# Patient Record
Sex: Female | Born: 1940 | Race: White | Hispanic: No | Smoking: Former smoker
Health system: Southern US, Community
[De-identification: ages and names within clinical notes are randomized; demographics above are authoritative.]

## PROBLEM LIST (undated history)

## (undated) DIAGNOSIS — R4182 Altered mental status, unspecified: Secondary | ICD-10-CM

## (undated) DIAGNOSIS — I499 Cardiac arrhythmia, unspecified: Secondary | ICD-10-CM

## (undated) DIAGNOSIS — R338 Other retention of urine: Secondary | ICD-10-CM

## (undated) DIAGNOSIS — D649 Anemia, unspecified: Secondary | ICD-10-CM

## (undated) DIAGNOSIS — E1142 Type 2 diabetes mellitus with diabetic polyneuropathy: Secondary | ICD-10-CM

## (undated) DIAGNOSIS — M21622 Bunionette of left foot: Secondary | ICD-10-CM

## (undated) DIAGNOSIS — N329 Bladder disorder, unspecified: Secondary | ICD-10-CM

## (undated) DIAGNOSIS — Z8719 Personal history of other diseases of the digestive system: Secondary | ICD-10-CM

## (undated) DIAGNOSIS — N179 Acute kidney failure, unspecified: Secondary | ICD-10-CM

## (undated) DIAGNOSIS — E785 Hyperlipidemia, unspecified: Secondary | ICD-10-CM

## (undated) DIAGNOSIS — F419 Anxiety disorder, unspecified: Secondary | ICD-10-CM

## (undated) DIAGNOSIS — M549 Dorsalgia, unspecified: Secondary | ICD-10-CM

## (undated) DIAGNOSIS — F329 Major depressive disorder, single episode, unspecified: Secondary | ICD-10-CM

## (undated) DIAGNOSIS — M1711 Unilateral primary osteoarthritis, right knee: Secondary | ICD-10-CM

## (undated) DIAGNOSIS — J189 Pneumonia, unspecified organism: Secondary | ICD-10-CM

## (undated) DIAGNOSIS — M2010 Hallux valgus (acquired), unspecified foot: Secondary | ICD-10-CM

## (undated) DIAGNOSIS — M8700 Idiopathic aseptic necrosis of unspecified bone: Secondary | ICD-10-CM

## (undated) DIAGNOSIS — D62 Acute posthemorrhagic anemia: Secondary | ICD-10-CM

## (undated) DIAGNOSIS — Z9189 Other specified personal risk factors, not elsewhere classified: Secondary | ICD-10-CM

## (undated) DIAGNOSIS — K5900 Constipation, unspecified: Secondary | ICD-10-CM

## (undated) DIAGNOSIS — N390 Urinary tract infection, site not specified: Secondary | ICD-10-CM

## (undated) DIAGNOSIS — G5602 Carpal tunnel syndrome, left upper limb: Secondary | ICD-10-CM

## (undated) DIAGNOSIS — G4730 Sleep apnea, unspecified: Secondary | ICD-10-CM

## (undated) DIAGNOSIS — D72829 Elevated white blood cell count, unspecified: Secondary | ICD-10-CM

## (undated) DIAGNOSIS — Z972 Presence of dental prosthetic device (complete) (partial): Secondary | ICD-10-CM

## (undated) DIAGNOSIS — N9989 Other postprocedural complications and disorders of genitourinary system: Secondary | ICD-10-CM

## (undated) DIAGNOSIS — N189 Chronic kidney disease, unspecified: Secondary | ICD-10-CM

## (undated) DIAGNOSIS — K920 Hematemesis: Secondary | ICD-10-CM

## (undated) DIAGNOSIS — J40 Bronchitis, not specified as acute or chronic: Secondary | ICD-10-CM

## (undated) DIAGNOSIS — J45909 Unspecified asthma, uncomplicated: Secondary | ICD-10-CM

## (undated) DIAGNOSIS — M1990 Unspecified osteoarthritis, unspecified site: Secondary | ICD-10-CM

## (undated) DIAGNOSIS — M797 Fibromyalgia: Secondary | ICD-10-CM

## (undated) DIAGNOSIS — F32A Depression, unspecified: Secondary | ICD-10-CM

## (undated) DIAGNOSIS — G8929 Other chronic pain: Secondary | ICD-10-CM

## (undated) DIAGNOSIS — M1611 Unilateral primary osteoarthritis, right hip: Secondary | ICD-10-CM

## (undated) DIAGNOSIS — Z872 Personal history of diseases of the skin and subcutaneous tissue: Secondary | ICD-10-CM

## (undated) DIAGNOSIS — K219 Gastro-esophageal reflux disease without esophagitis: Secondary | ICD-10-CM

## (undated) DIAGNOSIS — R011 Cardiac murmur, unspecified: Secondary | ICD-10-CM

## (undated) DIAGNOSIS — E43 Unspecified severe protein-calorie malnutrition: Secondary | ICD-10-CM

## (undated) DIAGNOSIS — I10 Essential (primary) hypertension: Secondary | ICD-10-CM

## (undated) HISTORY — DX: Idiopathic aseptic necrosis of unspecified bone: M87.00

## (undated) HISTORY — PX: SPINE SURGERY: SHX786

## (undated) HISTORY — DX: Unilateral primary osteoarthritis, right knee: M17.11

## (undated) HISTORY — DX: Hyperlipidemia, unspecified: E78.5

## (undated) HISTORY — PX: BACK SURGERY: SHX140

## (undated) HISTORY — PX: CATARACT EXTRACTION: SUR2

## (undated) HISTORY — DX: Altered mental status, unspecified: R41.82

## (undated) HISTORY — PX: CARPAL TUNNEL RELEASE: SHX101

## (undated) HISTORY — PX: JOINT REPLACEMENT: SHX530

## (undated) HISTORY — PX: COLON SURGERY: SHX602

## (undated) HISTORY — PX: APPENDECTOMY: SHX54

## (undated) HISTORY — DX: Acute posthemorrhagic anemia: D62

## (undated) HISTORY — DX: Other retention of urine: R33.8

## (undated) HISTORY — DX: Constipation, unspecified: K59.00

## (undated) HISTORY — DX: Unspecified severe protein-calorie malnutrition: E43

## (undated) HISTORY — DX: Acute kidney failure, unspecified: N17.9

## (undated) HISTORY — DX: Hallux valgus (acquired), unspecified foot: M20.10

## (undated) HISTORY — DX: Type 2 diabetes mellitus with diabetic polyneuropathy: E11.42

## (undated) HISTORY — PX: FOOT SURGERY: SHX648

## (undated) HISTORY — PX: COLONOSCOPY: SHX174

## (undated) HISTORY — PX: HERNIA REPAIR: SHX51

## (undated) HISTORY — DX: Other postprocedural complications and disorders of genitourinary system: N99.89

## (undated) HISTORY — DX: Bunionette of left foot: M21.622

## (undated) HISTORY — DX: Unilateral primary osteoarthritis, right hip: M16.11

## (undated) HISTORY — PX: ABDOMINAL HYSTERECTOMY: SHX81

## (undated) HISTORY — DX: Elevated white blood cell count, unspecified: D72.829

## (undated) HISTORY — DX: Personal history of diseases of the skin and subcutaneous tissue: Z87.2

## (undated) HISTORY — DX: Other specified personal risk factors, not elsewhere classified: Z91.89

## (undated) HISTORY — DX: Hematemesis: K92.0

## (undated) MED FILL — Ferumoxytol Inj 510 MG/17ML (30 MG/ML) (Elemental Fe): INTRAVENOUS | Qty: 17 | Status: AC

---

## 1999-06-26 ENCOUNTER — Ambulatory Visit (HOSPITAL_COMMUNITY): Admission: RE | Admit: 1999-06-26 | Discharge: 1999-06-26 | Payer: Self-pay | Admitting: *Deleted

## 1999-06-26 ENCOUNTER — Encounter (INDEPENDENT_AMBULATORY_CARE_PROVIDER_SITE_OTHER): Payer: Self-pay | Admitting: Specialist

## 2000-06-15 ENCOUNTER — Encounter: Admission: RE | Admit: 2000-06-15 | Discharge: 2000-06-15 | Payer: Self-pay | Admitting: Internal Medicine

## 2000-06-15 ENCOUNTER — Encounter: Payer: Self-pay | Admitting: Internal Medicine

## 2001-01-10 ENCOUNTER — Encounter: Payer: Self-pay | Admitting: Neurosurgery

## 2001-01-10 ENCOUNTER — Ambulatory Visit (HOSPITAL_COMMUNITY): Admission: RE | Admit: 2001-01-10 | Discharge: 2001-01-10 | Payer: Self-pay | Admitting: Neurosurgery

## 2001-01-27 ENCOUNTER — Ambulatory Visit (HOSPITAL_COMMUNITY): Admission: RE | Admit: 2001-01-27 | Discharge: 2001-01-27 | Payer: Self-pay | Admitting: Neurosurgery

## 2001-01-27 ENCOUNTER — Encounter: Payer: Self-pay | Admitting: Neurosurgery

## 2001-02-10 ENCOUNTER — Encounter: Payer: Self-pay | Admitting: Neurosurgery

## 2001-02-10 ENCOUNTER — Encounter: Admission: RE | Admit: 2001-02-10 | Discharge: 2001-02-10 | Payer: Self-pay | Admitting: Neurosurgery

## 2001-02-24 ENCOUNTER — Encounter: Admission: RE | Admit: 2001-02-24 | Discharge: 2001-02-24 | Payer: Self-pay | Admitting: Neurosurgery

## 2001-02-24 ENCOUNTER — Encounter: Payer: Self-pay | Admitting: Neurosurgery

## 2002-02-02 ENCOUNTER — Encounter: Payer: Self-pay | Admitting: Specialist

## 2002-02-09 ENCOUNTER — Inpatient Hospital Stay (HOSPITAL_COMMUNITY): Admission: RE | Admit: 2002-02-09 | Discharge: 2002-02-10 | Payer: Self-pay | Admitting: Specialist

## 2002-02-09 ENCOUNTER — Encounter: Payer: Self-pay | Admitting: Specialist

## 2002-07-24 ENCOUNTER — Encounter: Payer: Self-pay | Admitting: Specialist

## 2002-07-25 ENCOUNTER — Inpatient Hospital Stay (HOSPITAL_COMMUNITY): Admission: RE | Admit: 2002-07-25 | Discharge: 2002-07-26 | Payer: Self-pay | Admitting: Specialist

## 2002-12-16 ENCOUNTER — Ambulatory Visit (HOSPITAL_COMMUNITY): Admission: RE | Admit: 2002-12-16 | Discharge: 2002-12-16 | Payer: Self-pay | Admitting: *Deleted

## 2002-12-16 ENCOUNTER — Encounter (INDEPENDENT_AMBULATORY_CARE_PROVIDER_SITE_OTHER): Payer: Self-pay | Admitting: Specialist

## 2003-12-13 ENCOUNTER — Ambulatory Visit (HOSPITAL_COMMUNITY): Admission: RE | Admit: 2003-12-13 | Discharge: 2003-12-13 | Payer: Self-pay | Admitting: *Deleted

## 2003-12-13 ENCOUNTER — Encounter (INDEPENDENT_AMBULATORY_CARE_PROVIDER_SITE_OTHER): Payer: Self-pay | Admitting: Specialist

## 2004-09-18 ENCOUNTER — Ambulatory Visit: Payer: Self-pay | Admitting: Oncology

## 2004-11-13 ENCOUNTER — Ambulatory Visit: Payer: Self-pay | Admitting: Oncology

## 2005-01-08 ENCOUNTER — Ambulatory Visit: Payer: Self-pay | Admitting: Oncology

## 2005-02-26 ENCOUNTER — Ambulatory Visit: Payer: Self-pay | Admitting: Oncology

## 2005-03-06 ENCOUNTER — Inpatient Hospital Stay (HOSPITAL_COMMUNITY): Admission: RE | Admit: 2005-03-06 | Discharge: 2005-03-13 | Payer: Self-pay | Admitting: Specialist

## 2005-03-06 ENCOUNTER — Ambulatory Visit: Payer: Self-pay | Admitting: Physical Medicine & Rehabilitation

## 2005-03-13 ENCOUNTER — Inpatient Hospital Stay (HOSPITAL_COMMUNITY)
Admission: RE | Admit: 2005-03-13 | Discharge: 2005-03-20 | Payer: Self-pay | Admitting: Physical Medicine & Rehabilitation

## 2005-04-18 ENCOUNTER — Ambulatory Visit: Payer: Self-pay | Admitting: Oncology

## 2005-06-06 ENCOUNTER — Ambulatory Visit: Payer: Self-pay | Admitting: Oncology

## 2005-08-01 ENCOUNTER — Ambulatory Visit: Payer: Self-pay | Admitting: Oncology

## 2005-10-03 ENCOUNTER — Ambulatory Visit: Payer: Self-pay | Admitting: Oncology

## 2005-10-08 ENCOUNTER — Ambulatory Visit (HOSPITAL_COMMUNITY): Admission: RE | Admit: 2005-10-08 | Discharge: 2005-10-08 | Payer: Self-pay | Admitting: *Deleted

## 2005-10-08 ENCOUNTER — Encounter (INDEPENDENT_AMBULATORY_CARE_PROVIDER_SITE_OTHER): Payer: Self-pay | Admitting: *Deleted

## 2005-12-05 ENCOUNTER — Ambulatory Visit: Payer: Self-pay | Admitting: Oncology

## 2006-01-03 ENCOUNTER — Encounter: Admission: RE | Admit: 2006-01-03 | Discharge: 2006-01-03 | Payer: Self-pay | Admitting: Specialist

## 2006-01-30 ENCOUNTER — Ambulatory Visit: Payer: Self-pay | Admitting: Oncology

## 2006-02-15 ENCOUNTER — Emergency Department (HOSPITAL_COMMUNITY): Admission: EM | Admit: 2006-02-15 | Discharge: 2006-02-15 | Payer: Self-pay | Admitting: Emergency Medicine

## 2006-03-18 ENCOUNTER — Ambulatory Visit: Payer: Self-pay | Admitting: Oncology

## 2006-04-29 ENCOUNTER — Ambulatory Visit: Payer: Self-pay | Admitting: Oncology

## 2006-05-15 ENCOUNTER — Encounter: Admission: RE | Admit: 2006-05-15 | Discharge: 2006-05-15 | Payer: Self-pay | Admitting: Specialist

## 2006-05-20 ENCOUNTER — Ambulatory Visit: Payer: Self-pay | Admitting: Oncology

## 2006-06-06 ENCOUNTER — Inpatient Hospital Stay (HOSPITAL_COMMUNITY): Admission: RE | Admit: 2006-06-06 | Discharge: 2006-06-07 | Payer: Self-pay | Admitting: Specialist

## 2006-07-24 ENCOUNTER — Ambulatory Visit: Payer: Self-pay | Admitting: Oncology

## 2006-09-25 ENCOUNTER — Ambulatory Visit: Payer: Self-pay | Admitting: Oncology

## 2006-11-13 ENCOUNTER — Ambulatory Visit: Payer: Self-pay | Admitting: Oncology

## 2006-12-11 ENCOUNTER — Ambulatory Visit: Payer: Self-pay | Admitting: Oncology

## 2007-06-16 ENCOUNTER — Encounter: Admission: RE | Admit: 2007-06-16 | Discharge: 2007-06-16 | Payer: Self-pay | Admitting: Specialist

## 2007-07-09 ENCOUNTER — Ambulatory Visit: Payer: Self-pay | Admitting: Oncology

## 2008-01-08 ENCOUNTER — Encounter: Admission: RE | Admit: 2008-01-08 | Discharge: 2008-01-08 | Payer: Self-pay | Admitting: Specialist

## 2008-02-19 ENCOUNTER — Encounter: Admission: RE | Admit: 2008-02-19 | Discharge: 2008-02-19 | Payer: Self-pay | Admitting: Specialist

## 2008-03-10 ENCOUNTER — Encounter: Admission: RE | Admit: 2008-03-10 | Discharge: 2008-03-10 | Payer: Self-pay | Admitting: Specialist

## 2008-04-07 ENCOUNTER — Encounter: Admission: RE | Admit: 2008-04-07 | Discharge: 2008-04-07 | Payer: Self-pay | Admitting: Specialist

## 2010-07-06 ENCOUNTER — Ambulatory Visit (HOSPITAL_COMMUNITY): Admission: RE | Admit: 2010-07-06 | Discharge: 2010-07-06 | Payer: Self-pay | Admitting: Orthopedic Surgery

## 2011-01-18 LAB — BASIC METABOLIC PANEL
BUN: 22 mg/dL (ref 6–23)
CO2: 30 mEq/L (ref 19–32)
Calcium: 9.5 mg/dL (ref 8.4–10.5)
Chloride: 102 mEq/L (ref 96–112)
Creatinine, Ser: 1.18 mg/dL (ref 0.4–1.2)
GFR calc Af Amer: 55 mL/min — ABNORMAL LOW (ref >60)

## 2011-01-18 LAB — GLUCOSE, CAPILLARY
Glucose-Capillary: 104 mg/dL — ABNORMAL HIGH (ref 70–99)
Glucose-Capillary: 128 mg/dL — ABNORMAL HIGH (ref 70–99)

## 2011-01-18 LAB — CBC
MCH: 30.1 pg (ref 26.0–34.0)
MCHC: 33.1 g/dL (ref 30.0–36.0)
MCV: 91 fL (ref 78.0–100.0)
Platelets: 300 10*3/uL (ref 150–400)
RBC: 4.22 MIL/uL (ref 3.87–5.11)
RDW: 14.3 % (ref 11.5–15.5)

## 2011-01-18 LAB — PROTIME-INR: Prothrombin Time: 12.7 seconds (ref 11.6–15.2)

## 2011-03-07 ENCOUNTER — Encounter (HOSPITAL_BASED_OUTPATIENT_CLINIC_OR_DEPARTMENT_OTHER): Payer: Medicare Other | Admitting: Internal Medicine

## 2011-03-07 ENCOUNTER — Other Ambulatory Visit: Payer: Self-pay | Admitting: Internal Medicine

## 2011-03-07 DIAGNOSIS — D72829 Elevated white blood cell count, unspecified: Secondary | ICD-10-CM

## 2011-03-07 LAB — CBC WITH DIFFERENTIAL/PLATELET
Basophils Absolute: 0 10*3/uL (ref 0.0–0.1)
EOS%: 0.5 % (ref 0.0–7.0)
Eosinophils Absolute: 0.1 10*3/uL (ref 0.0–0.5)
HCT: 32.3 % — ABNORMAL LOW (ref 34.8–46.6)
HGB: 11 g/dL — ABNORMAL LOW (ref 11.6–15.9)
MONO#: 1.1 10*3/uL — ABNORMAL HIGH (ref 0.1–0.9)
NEUT#: 12.8 10*3/uL — ABNORMAL HIGH (ref 1.5–6.5)
NEUT%: 75.4 % (ref 38.4–76.8)
RDW: 16.4 % — ABNORMAL HIGH (ref 11.2–14.5)
WBC: 17 10*3/uL — ABNORMAL HIGH (ref 3.9–10.3)
lymph#: 3 10*3/uL (ref 0.9–3.3)

## 2011-03-09 LAB — COMPREHENSIVE METABOLIC PANEL
AST: 12 U/L (ref 0–37)
Albumin: 3.6 g/dL (ref 3.5–5.2)
BUN: 36 mg/dL — ABNORMAL HIGH (ref 6–23)
CO2: 24 mEq/L (ref 19–32)
Calcium: 9.1 mg/dL (ref 8.4–10.5)
Chloride: 101 mEq/L (ref 96–112)
Creatinine, Ser: 1.31 mg/dL — ABNORMAL HIGH (ref 0.40–1.20)
Potassium: 3.7 mEq/L (ref 3.5–5.3)

## 2011-03-09 LAB — LACTATE DEHYDROGENASE: LDH: 202 U/L (ref 94–250)

## 2011-03-23 NOTE — Op Note (Signed)
NAMELILYTH, MONTELLO              ACCOUNT NO.:  [id number]   MEDICAL RECORD NO.:  [id number]          PATIENT TYPE:  INP   LOCATION:  2550                         FACILITY:  [facility]   PHYSICIAN:  [name], M.D.   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 The area of degenerative scoliosis  extending from L2 to sacrum.   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 The area of degenerative scoliosis  extending from L2 to sacrum.  Intraoperative right dural tear L4-5 level.  The small bleb or tear at the L5 level on the right.   PROCEDURE:  Central decompressive laminectomy L2-3 at the level of the  facettes L3-4, L4-5 and L5-S1 with decompression of bilateral L2, bilateral  L3, bilateral L4 at 20 mL, bilateral L5, and bilateral S1 nerve roots. Right  L3-4 TLIF using a 9 mm leopard cage parallel endplates with right-sided  autogenous iliac crest bone graft. Left L4-5 TLIF with a 9 mm parallel  leopard cage with autogenous bone graft and Symphony graft material. Left L5-  S1 TLIF utilizing autogenous right iliac crest bone graft as well as  Symphony bone graft and a 9 mm lordotic cage. She underwent posterior  lateral fusion from L2 to sacrum with instrumentation from L2 to sacrum  using Monarch pedicle screws and rods. Both local and Symphony bone graft  material was used for the posterior lateral fusion component. The dural  repair right side L4-5. This involving the pia mater only, the arachnoid was  intact,  using multiple interrupted 4-0 Nurolon sutures and Tu-seal. The  right L5 level small  tear about 1 mm to 2 mm repaired with a single 4-0  Nurolon suture and Tu-seal.   SURGEON:  [name], M.D.   ASSISTANT:  Epimenio Foot, P.A.   ANESTHESIA:  GOT by Sharolyn Douglas, M.D.   SPECIMENS:  None.   ESTIMATED BLOOD LOSS:  950 mL. The patient received Cell Saver blood of 450  to 500 mL.   COMPLICATIONS:  Dural tear, right L4-5 requiring suture repair. This  involved primarily the pia mater with very little cerebral spinal fluid  loss. Small bleb right L5 level repaired with 4-0 Nurolon suture in Tisseel.  Unable to place a right L2 pedicle screw due to the size of the pedicle.  Difficulty instrumenting it; left side was only side able to be placed at  this level.   HISTORY OF PRESENT ILLNESS:  This patient is a 70 year old female who has  been followed for some time for problems of the collapsing degenerative  scoliosis of the lumbar spine with spondylolisthesis of both L4-5, L5-S1,  retrolisthesis L2-3 and L3-4. Her spondylolisthesis is to left side with  apex at the L3  level. The patient has undergone conservative management with  multiple steroid injections throughout the last decade. Her MRI studies have  demonstrated synovial cyst, moderate multifactorial stenosis of L5-S1, left-  sided foraminal narrowing at L4, L5 and at L5-S1. Protruded disk material  into the left neural foramen at the L5-S1 level. Mild narrowing L2-3,  degenerative disk changes and facette changes at L1-2 with no significant  abnormality at this side. Marked right-sided diskogenic change and  osteophyte formation at the L3-4 level affecting the exiting right L3 nerve  root. The patient was felt to require surgery involving the L3 to sacrum  because of the changes noted in each segment. It is felt that surgery at  only one or two levels would be ineffective and due to scoliosis it was felt  that recurrence would occur without decompression with fusion.   DESCRIPTION OF PROCEDURE:  After  adequate general anesthesia, the patient  had monitoring leads placed on her major muscle groups in upper and lower  extremities for intraoperative EMG monitoring and pedicle screw soft tissue  resistance testing. The patient had Foley catheter placed. Following  intubation, was then rolled to a prone position with chest rolls. All  pressure points well-padded, pillows under the legs. The patient had TED  stockings both lower extremities to prevent DVT. Cell Saver was used during  the case. She received standard preoperative antibiotics of Ancef and had a  repeat Ancef delivered at approximately 4 hours into the case at noon time.  The patient underwent a standard prep with DuraPrep solution from the lower  dorsal spine to the mid sacral level. This was then draped in the usual  manner. Iodine Vi-drape was used. Incision was made ellipsing the old  incision scar at the L3-4 level and extending the incision down to the  spinous process of T12-L1, L2-L3 was missing, L4-L5 and S1-S2. Cobbs were  then used to elevate the paralumbar muscles off the posterior lateral  aspects of the spinous process of L1, L2, L3 was not present. The care taken  not to enter the laminotomy defect at the L3-4 level. Then at the posterior  aspect of L4-L5 paralumbar muscles carefully removed off of these areas and  retracted. This was carried down to the sacrum on both sides. Vicryl  retractor was inserted and continuation of the dissection was carried out  removing the facette capsules at the L5-S1 level, L4-5, L3-4, L2-3 levels.  Intraoperative lateral radiograph for C-arm was used to identify the levels  present and the clamp was placed on the spinous process of L4, spinous  process of L2. Previous central laminectomy was at the L3-4 level as  identified.   Exposure was then carried out to the tips of the transverse process of L2 at the L1-2 facette preserving the L1-2 facette capsule bilaterally. Then the   transverse processes at the L3 level were exposed at the L2-3 facette level,  carried out distally exposing these completely. Then the L4 at the L3-4  joint line carried out laterally and exposed completely. Then the L5 and the  sacral ala bilaterally exposed. Excellent exposure was obtained on the both  sides. The ability to see the lateral aspect of the joints quite nicely and  facettes as well. With this then the central decompression was carried out  of these lateral aspects of the exposed posterior spine were then packed  with sponges. Attention turned to the central decompression of the L5-L4  spinous processes were resected  centrally and the central portions of lamina  carefully thinned. The 3 mm Kerrison then used to further resect the central  portions of the lamina bilaterally, resecting the lamina on the right side  at the L5 level, dural tear was noted. Small opening into a very thin dural  sac and probably the result of steroid medicines that had thinned the sac.  In either case, a single 4-0 Nurolon suture was used to close this area.  Attention then turned to continuation of the decompression at which time the  central laminectomy was further carried out with facetectomy of the left L5-  S1 level, at the L4-5 level on the left, near complete facetectomy on the  right side L4, L5 and L5, S1. The old L3-4 central laminectomy was repeated  with removal of scar over the posterior aspect of the dura at this segment.  Decompression of bilateral L3 and L4 nerve roots as well as foraminotomies  performed here. Right L3-4 facette was resected to allow for latter TLIF.  This decompression was then carried up to 2-3 preserving the L2 spinous  process and performing only a minor lateral recess decompression bilaterally  at L2-3 level. This completed the central laminectomy at which time areas  that showed a small amount of bleeding were packed using thrombin-soaked  Gelfoam. Irrigation  was performed. Pedicle screws were then placed first on  the left side beginning at the sacral level using an awl to make an entry  point just lateral to the superior articular process of L5 inferior extent,  directing it with convergence of about 40 degrees and directing this also  caudally in line with sacral inclination. The screw was placed. Length of 30  mm was used x 7.0. This captured both the posterior and anterior cortex of  the sacrum. Note that tapping was performed. High-speed bur was then used  carefully decorticate the ala and Symphony bone graft with local bone graft  mix was then used over this area of the sacrum on the left side. Screw was  then inserted at sacral level. The next screw placed on the left was at L5  level. The entry point made with an awl and then a pedicle probe used to  probe the pedicle at the L5 level, depth of 40 mm x 7.0 screw was used. Tapping with a 6.25 tap and placing appropriately converging at about 35  degrees from the vertical. This provided excellent fixation of this pedicle  at the L5 level on the left side. Of note that decortication was carried out  of the transverse process and additional symphony local bone graft was  placed. Then attention turned to the L3 level where again a screw was then  placed using an awl for initial entry point and pedicle finder used to probe  the pedicle and then tapping. Note that during the process of using both  pedicle probes and during tapping, ball tip probe was used to probe the  channel to ensure that there was no opening present. At the L4 level this  was carried out without difficulty. An additional 7.0 screw was used on the  left side at the 4 level and this was 40 mm in length placed without  difficulty. This captured the pedicle quite nicely. Attention turned to the  left L3 level where the pedicle screw of 6.25 was used, initial entry point  was made. Awl then used to enter. It was felt that the  initial entry point  was too medial to the pedicle and some broaching of the medial aspect  pedicle occurred so that this was then replaced lateral and this provided  excellent channel into the pedicle, measured for depth 40 mm. 6.25 x 40 mm  screw was placed on the left at the L3 level without difficulty. High-speed  bur then used to decorticate the transverse process. Additional bone graft  applied here left-side L2. Similar to that of 3, very difficult to provide  an entry point using an awl and then using a blunt tip pedicle probe this  area was probed on the left side and measured for depth. 40 mm screw was  used. It was noted quite deep that there was some penetration lateral aspect  of cortex however tapping was performed and screw able to be placed at this  level. The soft tissue resistance measured 21 at the L2 level on the left,  27 at the L3 level, 45 at the L4 level, 37 and the L4 level and 45 at the S1  level. The screws were then left in place. Bone grafting was adequate along  the left side extending into the lateral gutter beyond these pedicle screws.  Attention turned to the right side where sacral screws placed on the right  at the S1 level, again using a 30 mm x 7.0. Using a blunt tipped pedicle  probe to probe the pedicle first and measuring for depth, tapping and then  placing appropriate screw. Screw placed at the L5 level on the right side.  This was then 40 mm x 7.0. Again awl used to make an initial entry point  then the pedicle finder used to probe the pedicle. Channel with ball tip  probe to ensure the cortices were correct and then the screw was then placed  after tapping. Note, the decortication was carried out over the ala and the  transverse process of L5. Bone graft placed out over these areas prior to  placement of pedicle screws. Lag screw placed on the right side at the L4 level and this an awl was used to make an initial entry point and a pedicle  probe  was used to probe the pedicle right-sided L4. This was done without  difficulty. After probing a depth of 40 mm. 40 mm x 6.25 screw was chosen as  this was a quite hard pedicle, sclerotic, however was well within the  pedicle the probe throughout. This tested with the ball tip probe. 40 mm x  6.25 screw was placed at this level on the right side. Attention turned to  the L3 level where a screw was easily placed at this level on right side at  the L3 level using an awl to make an initial entry point and a pedicle  finder to probe the pedicle, measured for depth. 40 mm screw x 6.25 was  used. Attempts made to try and localize the pedicle at the L2 level and this  was unsuccessful mostly because breaking out of the cortex inferiorly and  laterally using the awl and the probe so that this was abandoned on the  right side and a decision made to only instrument the left side at the L2  level and perform posterior lateral and posterior bone graft at this segment  as opposed to at TLIF because of this. Attention then turned to perform a  TLIF first on the left side at the L5-S1 level where the thecal sac and the  L5 nerve root  were identified, retracted. Small epidural veins were  cauterized using bipolar electrocautery. Remaining portions of facette on  left side resected using 3 mm Kerrisons widely. 15 blade scalpel then used  incise the disk. Disk then dilated with 9 mm. The disk material removed  using pituitary rongeurs, curettes and ring curettes as needed until this  was debrided of both the cartilaginous end plates as well as the  degenerative disk present back to bleeding cortical bone surfaces that  represented subchondral bone. Using a sounder and a trial TLIF 9 mm provided  excellent fit. Lordotic 9 mm was chosen. This was packed with bone graft  from Symphony material initially and then attempts at trying to place this  into the disk space the Symphony material seemed to fall out of the  cage so  that an exposure was made of the right iliac crest through a separate  fascial incision over the right posterior superior iliac spine. Incision  made subperiosteal fashion exposing both posterior laterally and exposing  the crest posteriorly at this level. Osteotomes used then to remove bone  laterally. Additionally aspiration was performed of the crest 10 to 15 mL  for further bone graft preparation using Symphony material. The right iliac  crest inner table cancellus bone graft was then harvested using narrow  gouges and curettes. This was used for the cages performed from here on out  as well as the L5-S1 level cage. Cage was packed. Bone graft placed into the  disk space ahead of the cage and the cage impacted into place and subset  about 3 mm. This was placed in left side in order to correct apparent  tendency for this disk space to be further collapsed on the left side. This was inserted without difficulty and impacted into place. Left L4-5 disk was  similarly exposed. The facette on the left side at L4-5 completely resected.  The disk exposed with incision using a 15 blade scalpel and excised in the  posterior lateral aspect the disk on left side, dilated with 8 and 9-mm  dilators. Disk space then curettaged using straight upbiting and down-biting  curettes as well as ring curettes with the right angle curette. Pituitary  rongeur used to remove disk material from the intervertebral disk space  about difficulty.   This completed then the patient's disk had been completely debrided of disk  material as well as cartilaginous endplate down to bleeding bone surfaces.  Bone graft was then placed within this place. A sounder of 9 mm parallel  cage was placed. This seemed to give excellent fit. Additional bone graft  was then placed within the intervertebral disk space and 9 mm cage filled  with autogenous bone graft impacted into place using a laminar spreader  between the  pedicle to allow for further opening of this side. This  completed, the cage was carefully rotated into place using kicker impactors.  This was done without difficulty. Returning then to the 3-4 level on the  right side the patient's residual facette was completely resected. The L3  nerve root identified and retracted superiorly. The disk immediately entered  underneath the L3-4 level on the right side. Nerve root further retracted.  Dilators were then placed dilating the disk space on the right side at the  L3-4 level, elevating the right side superiorly. This completed then the  intervertebral disk space was carefully curettaged using a ring curettes and  straight curettes as best as possible. We were unable to use  very vigorous  curettage here as the disk space was quite narrow. While removing the  facette on the right side at the L3-4 level, the patient had a small spur  that was being removed using a rongeur. Rongeur then moved from side-to-side  and did cause a small dural tear at the L4-5 level about an inch and a half  below at this level and this occurred in the dura mater, did not cross into  the arachnoid and there was no CSF leak. This required closure using  interrupted 4-0 Nurolon sutures, approximating the dura mater. With  Valsalva, there was no evidence of further leak. However Tisseel was applied  at the end of the case to further seal this area. Returning to the L3-4  level, after further dilatation and curettage then bone graft was placed in  the disk space, after placing a 9 mm trial cage which appeared to give an  excellent fit. 9 mm parallel end plate cage was then carefully packed with  autogenous bone graft. Intervertebral disk space packed with additional bone  graft. This was then impacted into place without difficulty providing  excellent distraction of the disk space and restoration of the alignment of the spine at this level. At the L2-3 level decortication was  carried out in  transverse processes. Symphony bone graft was carried up from the L2 to L3  transverse process on that side. On the left side the joint was decorticated  at the L2-3 level. Bone graft placed into the joint. As well as posterior  lateral on the left side. Any remaining autogenous bone graft was placed  into the posterior lateral regions at the upper end of the fusion site  primarily. Rods were then fashioned for placement into the placed fasteners  of the pedicle screws at each level beginning with the right side. This  carefully contoured. Process of contouring the rod did manage to make it to  the floor and had to be placed into a sterilizer and sterilized using the  appropriate techniques for metal implants. Once this was completed and this  was able to be placed in the fasteners on the right side but during the  process of this being sterilized, the left-sided rod was carefully contoured  to the patient's fasteners and screws on the left side. These were then  placed into the screws and the fastener caps were placed at each level. This  was done quite nicely. Once this was completed and fasteners had been  placed, the right side rod was available and this was placed without  difficulty. Each of the caps for the fasteners were then placed without  difficulty on the right side. The cap was then tightened to 80 foot pounds  on the left side at the L2 level.  At the L3 level both sides, caps were  then tightened 80 foot pounds. Compression then obtained between the right  L3 and L4 fasteners and the L4 fastener the right side was then tightened 80  foot pounds. Compression obtained between the left L3 and L4 fasteners and  the left L4 fastener then tightened 80 foot pounds. Compression then  obtained between the L4 and L5 fasteners first on the left side and the L5  tightened to 80 foot pounds and then the right side also compression between  L4-L5 and the right L5 tightened  to 80 foot pounds. On the left side at the  L5-S1 leve,l compression obtained between the fasteners and the  sacral level  was then tightened 80 foot pounds at the fastener rod interval on the right  side similarly, compression obtained between the L5-S1 fasteners and the  right S1 fastener then tightened to the rod at 80 foot pounds. This  completed fastening of the rods. A transverse loading rod was then placed at  the upper L3 level. This was placed as a #9. Transverse loading rods placed  difficulty. Irrigation was performed. Remaining bone graft placed posterior  laterally as noted. Intraoperative C-arm fluoro images obtained in AP and  lateral planes documenting the fixation from L2 to sacrum internally with  TLIF's at L4-5, L3-4 and L5-S1. Attention then turned to dural tear on the  right side. Valsalva procedure performed and demonstrated no leakage. The  patient underwent placement of Tisseel to ensure that the dural repair remained intact and this was placed over the entire side extending from the  L3 level to L5. This completed, then there was very little active bleeding  present at this point. Further irrigation was carried out. Each of the  neural foramen were then carefully probed using a hockey neuro stick. S1  probed freely as did L5 bilaterally, L4 bilaterally, L3 bilaterally, and L2  bilaterally. Soft tissue then allowed to fall back into place. The  paralumbar muscles in midline over the central laminectomy defect extending  from L2-S1 was then carefully approximated loosely with interrupted #1  Vicryl sutures. The lumbodorsal fascia approximated in midline to the  spinous processes of L1-L2 and S1 using interrupted #1 Vicryl sutures in  figure-of-eight simple fashion. Deep subcu layers approximated after  approximating the fascial layers over the right iliac crest with #1 Vicryl  sutures. The deep fascial layers were then closed with interrupted 0 and #1  Vicryl sutures.  The more superficial fascial layers with interrupted 2-0  Vicryl sutures and skin closed with a running subcu stitch of 4-0 Vicryl.  Tincture of Benzoin and Steri-Strips applied, 4x4s, ABD pads affixed to the  skin with paper tape. The patient did have periods during the surgical  procedure where EMG and nerve conduction studies demonstrated some transient  abnormalities. This occurred when the dural tear occurred but was very  transient with return of active, normal activity within seconds. The patient  was then reactivated following return to a supine position, reactivated,  activated and returned to recovery room in satisfactory condition. All  instrument and sponge counts were correct.      JEN/MEDQ  D:  03/06/2005  T:  03/07/2005  Job:  [id number]

## 2011-03-23 NOTE — Op Note (Signed)
NAME:  Gloria Best, Gloria Best                        ACCOUNT NO.:  [id number]   MEDICAL RECORD NO.:  [id number]                   PATIENT TYPE:  AMB   LOCATION:  ENDO                                 FACILITY:  [facility]   PHYSICIAN:  Waverly Ferrari, M.D.                 DATE OF BIRTH:  31-Oct-1941   DATE OF PROCEDURE:  12/13/2003  DATE OF DISCHARGE:                                 OPERATIVE REPORT   PROCEDURE:  Upper endoscopy.   INDICATIONS:  GERD with known Barrett's esophagus.   ANESTHESIA:  Demerol 60 mg, Versed 6 mg.   DESCRIPTION OF PROCEDURE:  With the patient mildly sedated in the left  lateral decubitus position, the Olympus videoscopic endoscope was inserted  in the mouth, passed under direct vision through to the esophagus, which  appeared normal until we reached the distal esophagus, and there were  changes of Barrett's photographed and biopsied.  We entered into the  stomach.  Fundus, body appeared normal.  Antrum showed erythema that was  photographed and biopsied.  The duodenal bulb and second portion of the  duodenum were visualized, and they appeared normal.  From this point the  endoscope was slowly withdrawn, taking circumferential views of the duodenal  mucosa until the endoscope had been pulled back into the stomach, placed in  retroflexion to view the stomach from below, and a loose wrap of the GE  junction around the endoscope was visualized and photographed.  The  endoscope was straightened and withdrawn, taking circumferential views of  the remaining gastric and esophageal mucosa.  The patient's vital signs and  pulse oximetry remained stable.  The patient tolerated the procedure well  without apparent complications.   FINDINGS:  1. Changes of Barrett's esophagus, biopsied.  2. Changes of erythema of the antrum, biopsied.   Await biopsy report.  The patient will call me for results and follow up  with me as an outpatient.  Proceed to colonoscopy as planned.                                              Waverly Ferrari, M.D.    GMO/MEDQ  D:  12/13/2003  T:  12/13/2003  Job:  [id number]

## 2011-03-23 NOTE — H&P (Signed)
[facility]  Patient:    Gloria Best, Gloria Best Visit Number: [id number] MRN: [id number]          Service Type: Attending:  [name], M.D. Dictated by:   Trenda Moots. Mahar, P.A. Adm. Date:  02/09/02                           History and Physical  DATE OF BIRTH:  10/24/1941  CHIEF COMPLAINT:  Back pain and right leg pain.  HISTORY OF PRESENT ILLNESS:  The patient is a 70 year old female with an approximate one-year history of back and leg pain.  She had no trauma starting this, just progressively got worse.  Initially it started off as leg pain and progressed to a combination of right leg and back pain.  She has failed conservative measures.  She has also failed steroid injections.  It was felt that her best course of management at this point would be operative intervention and correction.  The risks and benefits were discussed with the patient by Dr. Basil Dess, and she indicated understanding and wished to proceed.  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  ALLERGIES:  No known drug allergies.  MEDICATIONS: 1. Methotrexate 0.7 injection each week, which has been stopped x2 weeks. 2. Folic acid 2 mg p.o. q.d. 3. Estrace 2 mg p.o. q.d. 4. Arthrotec 75/200, 1 p.o. q.d., which has been stopped x1 week. 5. Bumex 2 mg p.o. q.d. 6. Prilosec 40 mg p.o. q.d. 7. Detrol 2 mg p.o. q.d. 8. Neurontin 300 mg p.o. t.i.d. 9. Flexeril 10 mg p.o. q.d.  PAST MEDICAL HISTORY: 1. 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 2. Hypertension. 3. Urinary incontinence. 4. Frequent UTIs.  5. Gastroesophageal reflux.  6. Hiatal hernia.  7. History of pneumonia, most recently in 1999.  8. Frequent bronchitis.  9. Chronic anemia. 10. Rheumatoid arthritis. 11. Osteoarthritis.  PAST SURGICAL HISTORY:  1. Appendectomy in 1953.  2. C-section in 1969.  3. Foot surgery, hammer toe  correction on the right foot, in 1998.  4. Colon polyp excision in 1999.  5. Eyelid surgery in 2002.  SOCIAL HISTORY:  Denies tobacco use.  Denies alcohol use.  She is married, has four grown children.  Lives with her husband in a two-level home.  She is retired and on disability secondary to her rheumatoid arthritis.  FAMILY HISTORY:  Father alive at age 4, with osteoarthritis.  Mother deceased at age 41, secondary to an aneurysm.  She also had a history of hypertension. The patient has 9 siblings, six of whom have diabetes mellitus.  REVIEW OF SYSTEMS:  The patient denies any fevers, chills, night sweats, or bleeding tendencies.  CNS:  Denies any blurred vision, double vision, headaches, seizure, or paralysis.  CARDIOVASCULAR:  Denies any chest pain, angina, orthopnea, claudication, or palpitations.  PULMONARY:  Denies any shortness of breath, productive cough, or hemoptysis.  GASTROINTESTINAL: Denies any nausea, vomiting, constipation, diarrhea, melena, or bloody stool. GENITOURINARY:  Denies any dysuria, hematuria, or discharge.  MUSCULOSKELETAL: The patient reports decreased sensation in bilateral lower extremities which has been ongoing since she had her colon polyp removed.  The etiology is unclear.  Denies any paralysis, weakness.  PHYSICAL EXAMINATION:  VITAL SIGNS:  Blood pressure 136/80, respirations 16 and unlabored, pulse 72 and regular.  GENERAL:  The patient is a 70 year old white female who is alert and oriented. In no acute  distress.  Well-nourished, well-groomed.  Appears stated age.  She is very pleasant and cooperative to examination.  She walks with a normal gait.  HEENT:  Head normocephalic, atraumatic.  Pupils are equal, round, and reactive to light.  Nares patent bilaterally.  Extraocular movements intact.  Pharynx is clear, without any erythema or exudate.  NECK:  Soft to palpation.  No lymphadenopathy, thyromegaly, or bruits appreciated.  CHEST:   Clear to auscultation bilaterally anterior and posterior.  No rales, rhonchi, stridor, wheezes, or friction rubs appreciated.  HEART:  Regular rate and rhythm.  There is a grade [id number] systolic murmur which has been previously diagnosed by her primary care physician.  ABDOMEN:  Soft to palpation.  Positive bowel sounds throughout.  Nontender, nondistended.  No organomegaly noted.  GENITOURINARY:  Not pertinent, not performed.  EXTREMITIES:  Decreased sensation bilaterally to light touch.  Right is worse than the left throughout the entire lower extremity.  Not in a specific dermatomal pattern.  Motor function is intact.  She is neurovascularly intact. Posterior dorsalis pedis and posterior tibialis pulses bilaterally.  SKIN:  Intact.  Without any lesions or rashes.  LABORATORY DATA:  MRI as per HPI.  IMPRESSION:  1. 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  2. Hypertension.  3. Urinary incontinence.  4. Frequent UTIs.  5. Gastroesophageal reflux.  6. Hiatal hernia.  7. History of pneumonia, most recently in 1999.  8. Frequent bronchitis.  9. Chronic anemia. 10. Rheumatoid arthritis. 11. Osteoarthritis.  PLAN:  1. Admit to [facility] on February 09, 2002, for a right L5     hemilaminectomy with right L5 nerve root decompression.  This will be done     by Dr. Basil Dess.  2. The patients primary care physician is Dr. Lysbeth Galas, who has forwarded a    letter of medical clearance indicating that the patient is medically    stable and may proceed with the proposed surgery. Dictated by:   Trenda Moots. Mahar, P.A. Attending:  [name], M.D. DD:  01/30/02 TD:  01/30/02 Job: [id number]

## 2011-03-23 NOTE — Op Note (Signed)
Gloria Best, Gloria Best              ACCOUNT NO.:  [id number]   MEDICAL RECORD NO.:  [id number]          PATIENT TYPE:  INP   LOCATION:  5030                         FACILITY:  [facility]   PHYSICIAN:  [name], M.D.   DATE OF BIRTH:  1941-03-10   DATE OF PROCEDURE:  06/06/2006  DATE OF DISCHARGE:                                 OPERATIVE REPORT   SURGEON:  [name], M.D.   ASSISTANT:  None.   PREOPERATIVE DIAGNOSIS:  Right L2-3 herniated nucleus pulposus, with  persistent right proximal thigh pain and weakness; the herniation is below  previous fusion, L2 to sacrum; this level without transforaminal lumbar  interbody fusion.   POSTOPERATIVE DIAGNOSIS:  Right L2-3 herniated nucleus pulposus, with  persistent right proximal thigh pain and weakness; the herniation is below  previous fusion, L2 to sacrum; this level without transforaminal lumbar  interbody fusion.   PROCEDURE:  Right-sided L2-3 microdiskectomy.   ANESTHESIA:  General via orotracheal intubation, Dr. Crissie Sickles. [place], M.D.   SPECIMENS:  None.   ESTIMATED BLOOD LOSS:  15 cc.   COMPLICATIONS:  None.   DISPOSITION:  The patient returned to the PACU in good condition.   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  She did well initially following her surgery with  return of good function, standing and walking, but has recently over the  last half year developed increasing pain in her back with radiation to the  right leg.  Underwent conservative management.  Eventually, it has gotten to  the point where she is having severe persistent pain requiring narcotic  medicines, difficulty with any length of standing and walking due to leg  weakness and pain on the right side.  Myelogram was done and this  demonstrated a disk  herniation on the right side at the L2-3 level,  affecting the right side of the thecal sac.  Her findings were consistent  with L2 and L3 radiculopathy.   INTRAOPERATIVE FINDINGS:  Large disk protrusion, right sided, fragmented  herniated disk material, in addition spondylosis affecting the facet joint  on this side, compressing the thecal sac.   DESCRIPTION OF PROCEDURE:  After adequate general anesthesia, the patient  was placed on the [place] frame in knee-chest position.  Standard prep with  DuraPrep solution, draped in the usual manner.  A spinal needle was placed.  Intraoperative lateral radiograph demonstrated the needle was at the L3 and  4 level.  Incision made above these levels, ellipsing the old incisional  scar in the midline down to the lumbodorsal fascia.  Electrocautery used to  control bleeders.  Weitlaner retractor there.  The lumbodorsal fascia  incised over the residual portion of the spinous process of L2, carried  distally an additional inch.  The paralumbar muscle on the right side then  carefully elevated off the right side of the lamina of L2 down to the lamina  and the joint on the right side  at L2-3.  A clamp then placed over the  spinous process of L2.  An additional clamp placed over the soft tissue  below this level.  The clamp at the spinous process of L2 on lateral joint  and second radiograph here.  These areas marked for continued  identification.  The spinous process was then debrided, removing  approximately 3/4 of the spinous process up to the level of the patient's  inferior aspect of the lamina of L2.  This was carefully freed up using a  curet.  Then, the lateral border of the previous laminectomy region for  laminectomy and fusion was identified.  The pedicle screw at the L3 level  was identified.  Under loupe magnification, then, the exposure was obtained  initially.  Then, the operating room microscope was brought into the field.  A 3-mm  Kerrison was used to excise the inferior 50% of the lamina on the  right side at L2, opening the neural canal, the spinal canal, identifying  the thecal sac and the lateral recess and the L2 nerve root on the right  side.  Pedicle screw of L3 identified.  And, carefully, a 2.0 and 4.0  straight curet were used to debride the medial border of the pedicle,  identifying this, and then the superior articular process of L3, extending  upward.  The superior articular process of L3 appeared to be impinging  significantly upon the thecal sac at the 2-3 level, indenting the sac  posteriorly.  The upper edge of the superior articular process of L3 was  identified and a curet used to remove a small portion of this until a 2-mm  Kerrison could be introduced beneath the superior margin of the superior  articular process of L3.  This was then used to carefully remove the medial  portion of the superior articular process of L3 and decompress the lateral  recess at the L2-3 level.  An osteotome was used to cut the medial facet  margin, and then this was removed using a pituitary as well as nerve hook,  excising this from the lateral recess and decompressing the lateral recess  here.  Immediately following the removal of this fragment of superior  articular process of L3, the disk material that was found to be present  represented herniated disk material at the L2-3 level.  Pituitaries were  used to excise this disk material.  The thecal sac was able to be retracted  medially, and pituitaries were then able to be inserted into the L2-3 disk,  excising further fragmented degenerative disk.  The disk material also had  tented/extended over the posterior aspect of the body of L3 downward in the  lateral recess on the right side.  This was further excised using the ball-  tipped probes provided, as well as a hockey stick neural probe.  When the disk material had been debrided from the spinal canal as well as  over the  disk space, then irrigation was performed.  Careful inspection of the L2 and  L3 nerve roots demonstrated them to be patent and to be exiting their neural  foramina without further impingement.  Further irrigation was performed.  Thrombin-soaked Gelfoam used to control bleeders.  Bone wax applied to the  cancellous bone, and all Gelfoam was removed.  Soft tissues were allowed to  fall back into place, as there was no active bleeding present.  Lumbodorsal  fascia was then reapproximated in the midline with interrupted 0 Vicryl  sutures,  with the deep subcu layers approximated with interrupted 0 Vicryl  sutures and the more superficial layers with interrupted 2-0 Vicryl sutures.  Skin closed with a running subcu stitch of 4-0 Vicryl.  Dermabond was then  applied.  Once this was applied, a Coverlet dressing was applied.  The  patient was then returned to her bed, reactivated, extubated and returned to  the recovery room in satisfactory condition.  All instrument and sponge  counts were correct.      [name], M.D.  Electronically Signed     JEN/MEDQ  D:  06/06/2006  T:  06/07/2006  Job:  [id number]

## 2011-03-23 NOTE — H&P (Signed)
Gloria Best, Gloria Best              ACCOUNT NO.:  [id number]   MEDICAL RECORD NO.:  [id number]          PATIENT TYPE:  IPS   LOCATION:  [id number]                         FACILITY:  [facility]   PHYSICIAN:  Charlett Blake, M.D.DATE OF BIRTH:  [birth year]   DATE OF ADMISSION:  03/13/2005  DATE OF DISCHARGE:                                HISTORY & PHYSICAL   CHIEF COMPLAINT:  Back pain, left knee pain.   HISTORY:  A 70 year old female.  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  Underwent central decompressive laminectomy  at L2-3, bilateral laminectomy L3, L4, L5 with interbody fusion L4-5, repair  of intraoperative dural tear L4-5, and small tear L5 per Dr. Louanne Skye on Mar 06, 2005.  Postoperatively with some nausea.  Reglan added May 6.  Has had  problems with urinary retention requiring I&O catheterization.  Subcutaneous  Lovenox added May 8 for DVT prophylaxis.  Procrit for chronic anemia resumed  as of May 8.  Has had some leukocytosis.  Left lower extremity pain which is  positional in nature.  She also has left wrist pain, old wrist fracture  wearing splint chronically.  Left knee x-ray showed mild DJD.   REVIEW OF SYSTEMS:  In addition to above problem, positive reflux,  constipation, urinary retention, back pain, wound area lumbago, cervicalgia,  weakness.   PAST MEDICAL HISTORY:  1.  Hypertension.  2.  Back surgeries April 2003, September 2003.  3.  Extracted cataracts bilateral.  4.  Cesarean section.  5.  Appendectomy.  6.  TAH/BSO.  7.  Anemia of chronic disease.  8.  Colonic polyps.  9.  Colon resection 1997.  10. Barrett's esophagus.  11. RA.  12. Neuropathy.  13. Bladder tack in 2005.  14. Right foot hammer toe correction.   SOCIAL HISTORY:  Married.  Lives with husband.  One-level home.  One step to  entry.  Independent prior to admission.  Daughter to assist after  discharge.  Negative ETOH.  Remote tobacco.   FUNCTIONAL HISTORY:  Independent prior to admission.  Functional status  currently min assist bed mobility, min assist transfers, min assist  ambulating 25 feet with walker.   HOME MEDICATIONS:  Nexium, Flexeril, Nasacort, Ultram, Vicodin, Bumex,  Micardis, Catapres, Lyrica, Arthrotec, folate, methotrexate.   ALLERGIES:  ADHESIVE TAPE.   Last hemoglobin 9.7 on May 6.  Last BUN 12 and creatinine 0.9 on May 5.  Last potassium 4.4 on May 5.  Chest x-ray May 2:  Left perihilar  atelectasis.   PHYSICAL EXAMINATION:  GENERAL:  Elderly female in no acute distress.  HEENT:  Eyes noninjected, anicteric.  External ENT normal.  NECK:  Supple without adenopathy.  LUNGS:  Clear.  Good respiratory effort.  HEART:  Regular rate and rhythm.  No rubs, murmurs, or extra sounds.  EXTREMITIES:  Without edema.  Left patella has tenderness at the superior  pole, increases with range of motion.  She has no evidence of knee effusion.  No evidence of calf tenderness.  Negative Homan's.  Negative thigh swelling  or pain.  ABDOMEN:  Positive bowel sounds, soft, nontender to palpation.  BACK:  Incision is healing well.  No evidence of drainage.  No erythema in  that area.  No fluid collections palpable.  NEUROLOGIC:  Mood and affect are normal.  Cognition appears intact.  Sensation intact to light touch bilateral lower extremities.   Motor strength 5/5 bilateral deltoid, biceps, triceps grip, 5 on the right  lower extremity, 4- in the left lower extremity.   IMPRESSION:  1.  Lumbar spondylolisthesis of radicular symptoms which have improved after      decompression.  She continues to have low back pain status post fusion      postoperative day seven using back precautions and brace when up.  2.  Pain management.  Flexeril will be increased to t.i.d.  Lyrica is      continued for neuropathy.  Ultram is q.i.d.  She has p.r.n. oxycodone      and has been started  on Celebrex for tendinitis per Dr. Louanne Skye just      today.  Will increase it to b.i.d.  3.  Left knee patellar tendinitis.  Will add ice.  Will add sports cream.      Increase the Celebrex, as noted.  4.  Rheumatoid arthritis.  Continue methotrexate.  5.  Urinary retention.  Will continue to monitor.  Check urinalysis, C&S.  6.  Left wrist splint for remote fracture.   ESTIMATED LENGTH OF STAY:  Seven days.  Good rehabilitation candidate.      AEK/MEDQ  D:  03/13/2005  T:  03/13/2005  Job:  [id number]   cc:   [name], M.D.  [place]  [place] [address]  Fax: [phone]   Waverly Ferrari, M.D.  [address] [place]  [place] [address]  Fax: [phone]

## 2011-03-23 NOTE — Consult Note (Signed)
Gloria Best, Gloria Best              ACCOUNT NO.:  [id number]   MEDICAL RECORD NO.:  [id number]          PATIENT TYPE:  IPS   LOCATION:  [id number]                         FACILITY:  [facility]   PHYSICIAN:  Domingo Pulse, M.D.  DATE OF BIRTH:  [birth year]   DATE OF CONSULTATION:  03/14/2005  DATE OF DISCHARGE:                                   CONSULTATION   REASON FOR CONSULTATION:  Postoperative urinary retention.   HISTORY:  This 70 year old female underwent recent spine surgery by Dr.  Basil Dess.  The patient initially voided well when her Foley catheter was  removed but then went into urinary retention.  She had a Foley catheter in  place.  The patient was transferred to the rehab unit on Mar 13, 2005.  The  patient states that after her catheter was removed she has actually been  able to urinate, and she really feels that she is no longer having a problem  with her voiding.   The patient does have a past history of previous episodes of urinary  retention.  She is status post bladder suspension surgery by Dr. Nila Nephew in  [place] and had prolonged retention following that surgery last year.  She  was taught self-catheterization and eventually had normalization of her  voiding function.  The patient also had a similar episode of retention  following colon surgery and was told that she had bladder shock.  Eventually, this straightened out as well.  The patient now feels that a  similar process occurred this time, and she feels she is voiding quite  nicely.   The patient is status post L4-5 and L5-S1 surgery because of collapsing  lumbar spondylolisthesis.  The patient suffered from low back pain for many  years and has had several previous back procedures in 2003.   PAST MEDICAL HISTORY:  1.  Hypertension.  2.  Anemia of chronic disease.  3.  The patient has previously undergone colon resection.  4.  She also is known to have some peripheral neuropathy.  5.  She has had Barrett's  esophagus.  6.  She has had problems with cataracts.  She has had bilateral cataract      surgery.   OTHER PROCEDURES:  1.  Appendectomy.  2.  Tonsillectomy.  3.  Cesarean section.  4.  Right hammertoe surgery.   SOCIAL HISTORY:  Unremarkable.  The patient has not used tobacco in many  years.  She does not use alcohol.  She is married and lives in [place].   FAMILY HISTORY AND REVIEW OF SYSTEMS:  Noncontributory.   PHYSICAL EXAMINATION:  GENERAL:  The patient is a well-developed, well-  nourished female in no acute distress.  She is resting comfortably.  PELVIC:  Since the patient is emptying her bladder to completion, there  really is no need for a pelvic examination to be performed.   I would be happy to see the patient should any problems arise.  Should the  patient have recurrence of her retention, clearly she should be restarted on  her self-catheterization, and we would recommend that she undergo urodynamic  evaluation in our office post discharge.  If she continues to void well,  however, that clearly will not be necessary, as these episodes of retention  appear to be induced by surgical procedures.      RJE/MEDQ  D:  03/14/2005  T:  03/14/2005  Job:  [id number]   cc:   Domingo Pulse, M.D.  [address] [place]  [place] [address]  Fax: [phone]   Charlett Blake, M.D.  [address]  [place]  [place] [address]  Fax: [phone]   [name], M.D.  [address]  [place]  [place] [address]  Fax: [phone]

## 2011-03-23 NOTE — Op Note (Signed)
[name], [name]              ACCOUNT NO.:  [id number]   MEDICAL RECORD NO.:  [id number]          PATIENT TYPE:  AMB   LOCATION:  ENDO                         FACILITY:  [facility]   PHYSICIAN:  Waverly Ferrari, M.D.    DATE OF BIRTH:  16-Jul-1941   DATE OF PROCEDURE:  10/08/2005  DATE OF DISCHARGE:                                 OPERATIVE REPORT   PROCEDURE:  Upper endoscopy with biopsy.   INDICATIONS:  GERD.   ANESTHESIA:  Demerol 50, Versed 5 mg.   PROCEDURE:  With the patient mildly sedated in the left lateral decubitus  position, the Olympus videoscopic endoscope was inserted in the mouth,  passed under direct vision through the esophagus, and the squamocolumnar  junction could not be well seen.  It was photographed as best we could and  biopsied as best we could.  From this point, we entered into the stomach.  Fundus, body, antrum, duodenal bulb, second portion of duodenum were  visualized.  From this point, the endoscope was slowly withdrawn taking  circumferential views of duodenal mucosa until the endoscope had been pulled  back in the stomach, placed in retroflexion to view the stomach from below.  The endoscope was straightened and withdrawn, taking circumferential views  of remaining gastric and esophageal mucosa.  The patient's vital signs and  pulse oximeter remained stable.  The patient tolerated the procedure well  without apparent complications.   FINDINGS:  Await biopsy report.  The patient will call me for results and  follow up with me as an outpatient.           ______________________________  Waverly Ferrari, M.D.     GMO/MEDQ  D:  10/08/2005  T:  10/08/2005  Job:  [id number]

## 2011-03-23 NOTE — H&P (Signed)
NAME:  Gloria Best, Blew NO.:  [id number]   MEDICAL RECORD NO.:  [id number]                   PATIENT TYPE:   LOCATION:                                       FACILITY:   PHYSICIAN:  [place] BIRTH:  08/23/1941   DATE OF ADMISSION:  07/24/2002  DATE OF DISCHARGE:                                HISTORY & PHYSICAL   CHIEF COMPLAINT:  Pain in my back and right leg.   HISTORY OF PRESENT ILLNESS:  This 70 year old female seen by Dr. Louanne Skye for  continuing and progressive problems concerning her low back.  She has had a  previous lumbar laminectomy in the early part of this year, but has  continued with right lower extremity pain.  This is primarily in the knee  anteriorly.  She also has occasional paresthesias in the same area.  Motion  was seen at L5-S1, however, when MRI was performed we primarily saw  discopathy seen at the L3-4 level.  Right side extension was seen from a  broad based disk protrusion entering the foramen.  This was effecting the  right L4 and L3 nerve roots.  This was in keeping with her pain pattern as  mentioned above.  Due to this finding, as well as the fact that the patient  had progressive problems which is recalcitrant to conservative care, it was  felt that surgical intervention be indicated for the L3-4 level on the  right.  She is admitted for that surgery.   PAST MEDICAL HISTORY:  This lady has been in relatively good health.  Dr.  Dannielle Huh is her family doctor.   MEDICATIONS:  1. Methotrexate 0.7 injections each week.  2. Folic acid 2 mg p.o. q.d.  3. Esterase 2 mg p.o. q.d.  4. Arthrotek 75/200 one p.o. q.d.  5. Bumex 2 mg p.o. q.d.  6. Prilosec 40 mg p.o. q.d.  7. Detrol 2 mg p.o. q.d.   ALLERGIES:  No known drug allergies.   PAST SURGICAL HISTORY:  1. L5 hemilaminectomy with right L4-5 nerve root compression in 4/03.  2. Appendectomy in 1953.  3. Cesarean section in  1969.  4. Hammer toe correction of the right foot in 1998.  5. Colon polyp excision by endoscopy in 1999.  6. Eyelid surgery in 2002.   PAST MEDICAL HISTORY:  1. Hypertension.  2. Urinary incontinence.  3. Frequent urinary tract infections.  4. Gastroesophageal reflux disease.  5. Hiatal hernia.  6. Frequent bronchitis.  7. Chronic anemia.  8. Rheumatoid arthritis.  9. Osteoarthritis.   SOCIAL HISTORY:  The patient is a housewife, neither smokes nor drinks.   FAMILY HISTORY:  Positive for hypertension and diabetes in her father and  brother.  Mother died of heart disease.   REVIEW OF SYMPTOMS:  CNS:  No seizures, paralysis, no trouble with vision,  but radiculitis as mentioned above with the L3-4 nerve root on the right.  RESPIRATORY:  No productive cough, no hemoptysis, no shortness of breath.  CARDIOVASCULAR:  No chest pain, no angina, no orthopnea.  GASTROINTESTINAL:  No nausea, vomiting, melena, or bloody stool.  GENITOURINARY:  No discharge,  hematuria, dysuria.  MUSCULOSKELETAL:  Primarily in history of present  illness.   PHYSICAL EXAMINATION:  GENERAL:  Alert, cooperative, and friendly 45-year-  old white female.  VITAL SIGNS:  Blood pressure 135/85, pulse 70, respiratory rate 20,  temperature 98.  HEENT:  Normocephalic.  Pupils equal, round, reactive to light and  accommodation.  Extraocular movements were intact.  Oropharynx is clear.  CHEST:  Clear to auscultation.  No rhonchi, no rales.  HEART:  Regular rate and rhythm, no murmurs are heard.  ABDOMEN:  Soft, nontender, liver and spleen not felt.  GENITALIA:  Not done, not pertinent to present illness.  RECTAL:  Not done, not pertinent to present illness.  PELVIC:  Not done, not pertinent to present illness.  BREASTS:  Not done, not pertinent to present illness.  EXTREMITIES:  As in present illness above.   ADMISSION DIAGNOSES:  1. Herniated nucleus pulposis at L3 and L4 on the right, and central.  2.  Gastroesophageal reflux disease.  3. Rheumatoid arthritis.  4. Hypertension.  5. Urinary incontinence.  6. Frequent urinary tract infections.  7. Hiatal hernia.  8. Frequent bronchitis.  9. Chronic anemia.  10.      Osteoarthritis.   PLAN:  The patient will undergo right microdiskectomy of L3 and L4.        Dooley L. Clabe Seal, P.A.             [phone]   DLU/MEDQ  D:  07/09/2002  T:  07/09/2002  Job:  [id number]

## 2011-03-23 NOTE — Op Note (Signed)
   NAME:  LAUREA, TITLOW                        ACCOUNT NO.:  [id number]   MEDICAL RECORD NO.:  [id number]                   PATIENT TYPE:  AMB   LOCATION:  ENDO                                 FACILITY:  [facility]   PHYSICIAN:  Waverly Ferrari, M.D.                 DATE OF BIRTH:  08/29/1941   DATE OF PROCEDURE:  DATE OF DISCHARGE:                                 OPERATIVE REPORT   PROCEDURE:  Upper endoscopy with biopsy.   INDICATIONS FOR PROCEDURE:  Barrett's.   ANESTHESIA:  Demerol 50, Versed 4 mg.   DESCRIPTION OF PROCEDURE:  With the patient mildly sedated in the left  lateral decubitus position, the Olympus videoscopic endoscope was inserted  in the mouth and passed under direct vision through the esophagus which  appeared normal until we reached the distal esophagus and changes of  Barrett's were seen, photographed and biopsied. We entered into the stomach,  fundus, body, antrum, duodenal bulb and second portion of the duodenum  appeared normal. From this point, the endoscope was slowly withdrawn taking  circumferential views of the duodenal mucosa until the endoscope was then  pulled back in the stomach, placed in retroflexion to view the stomach from  below. The endoscope was then straightened and withdrawn taking  circumferential views of the remaining gastric and esophageal mucosa. The  patient's vital signs and pulse oximeter remained stable. The patient  tolerated the procedure well without apparent complications.   FINDINGS:  Barrett's esophagus biopsied. Await biopsy report. The patient  will call me for results and followup with me in as an outpatient.                                               Waverly Ferrari, M.D.    GMO/MEDQ  D:  12/16/2002  T:  12/16/2002  Job:  [id number]

## 2011-03-23 NOTE — Discharge Summary (Signed)
Gloria Best, Gloria Best              ACCOUNT NO.:  [id number]   MEDICAL RECORD NO.:  [id number]          PATIENT TYPE:  IPS   LOCATION:  [id number]                         FACILITY:  [facility]   PHYSICIAN:  Charlett Blake, M.D.DATE OF BIRTH:  25-Jan-1941   DATE OF ADMISSION:  03/13/2005  DATE OF DISCHARGE:  03/20/2005                                 DISCHARGE SUMMARY   DISCHARGE DIAGNOSES:  1.  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  2.  Hypertension.  3.  Rheumatoid arthritis.  4.  Constipation.  5.  Left wrist fracture.   HISTORY OF PRESENT ILLNESS:  Gloria Best is a 70 year old female with history  of hypertension, chronic low back pain, secondary to collapsing lumbar  spondylolisthesis, L4-L5, L5-S1, and retrolisthesis, L2-L3, L3-L4.  She  underwent central decompressive laminectomy, L2-L3, bilateral laminectomy,  L3-S1 with TLIF L4-5, and repair of intraoperative dural tear L4-5, and  small tear of left L5 on Mar 06, 2005 by Dr. Louanne Skye.  Postoperatively, the  patient with some nausea requiring addition of Reglan.  She has also had  problems with urinary retention requiring in and out cath.  Subcutaneous  Lovenox was added on Mar 12, 2005 for DVT prophylaxis.  The patient with  history of chronic anemia, and Procrit was resumed to help assist with her  anemia.  The patient has had complaints of left lower extremity pain with  tendency to keep the lower extremity flexed.  She is currently using partial  walker.  To be mobilized with left wrist splint in place for an old left  wrist fracture.  She is currently at minimum assist for bed mobility,  minimum assist for transfers, minimum assist ambulating 25 feet.  Therefore,  CIR was consulted for progression.   HOSPITAL COURSE:  Gloria Best was admitted to rehabilitation on Mar 13, 2005 for inpatient therapies to consist of PT and OT daily.  Post  admission, the patient was maintained on subcutaneous Lovenox for DVT  prophylaxis.  Celebrex was added to help with her rheumatoid arthritis  symptoms.  The patient has had issues with constipation, and was set up on a  bowel regimen.  Voiding was monitored with PVR checks, and the patient was  noted to be voiding without difficulty.  As the patient's pain control  improved, she was able to improve her mobility.  Pain control was reasonable  with the use of p.r.n. Vicodin, scheduled Ultram, scheduled Flexeril, as  well as Celebrex.  Her back incision was healing well without any signs of  symptoms of infection.   LABORATORY DATA:  CBC revealed hemoglobin of 9.6, hematocrit 29.5, white  count 12.6, platelets 783.  Check of electrolytes revealed sodium of 132,  potassium 5.2, chloride 97, CO2 of 29, BUN 20, creatinine 0.8, glucose 103.  UA UC was done post admission showing patient with greater than 100,000  colonies of E. coli in her urine.  She was started on Keflex, and is to  continue antibiotics for 5 total days of antibiotic therapy.  The patient made good progress during her stay in rehabilitation.  She  progressed to being a supervision level for transfer, supervision level for  ambulating 160 feet with platform rolling walker.  She required set up  assist for ADL's.  Further follow up therapies will be home health PT by  [facility].  The patient's family to provide assistance as needed  post discharge.  On Mar 20, 2005, the patient is discharged to home.   DISCHARGE MEDICATIONS:  1.  Restasis 0.05% one GGT OU b.i.d.  2.  Catapres 0.1 mg q.h.s.  3.  Flexeril 10 mg t.i.d.  4.  Estrace 1 mg daily.  5.  Folic acid 1 mg daily.  6.  Micardis hydrochlorothiazide 80-12.5 daily.  7.  Ultram 50 mg q.i.d.  8.  __________ 5 mg daily.  9.  Arthrotec b.i.d.  10. Keflex 250 mg q.i.d.  11. Oxycodone IR 5-10 mg q.4-6h. p.r.n. pain.  12. Iron supplements once a day.   ACTIVITY:   1.  Ambulate with use of walker.  2.  Follow routine back precautions.  3.  May shower without brace.   DIET:  Regular.   WOUND CARE:  Wash the area with soap and water.  Keep the area clean and  dry.   SPECIAL INSTRUCTIONS:  1.  Do not use Bumex or Vicodin.  2.  Dulcolax tabs 2 p.o. q.h.s. for constipation.   FOLLOW UP:  1.  The patient is to follow up with Dr. Louanne Skye in 1-2 weeks.  2.  Follow up with Dr. Lysbeth Galas for routine check.  3.  Follow up with Dr. Letta Pate as needed.      PP/MEDQ  D:  03/21/2005  T:  03/21/2005  Job:  [id number]   cc:   [name], M.D.  [address]  [place]  [place] [address]  Fax: [phone]

## 2011-03-23 NOTE — Op Note (Signed)
[facility]  Patient:    ARLINGTON, RILE Visit Number: [id number] MRN: [id number]          Service Type: SUR Location: 1S X003 01 Attending Physician:  Madilyn Hook Dictated by:   [name], M.D. Proc. Date: 02/09/02 Admit Date:  02/09/2002                             Operative Report  PREOPERATIVE DIAGNOSIS:  Right lateral recess stenosis L5-S1 associated with a grade 1 spondylolithesis with foraminal entrapment right L5 and S1 nerve roots.  POSTOPERATIVE DIAGNOSES: 1. Right lateral recess stenosis L5-S1. 2. 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 3. Right S1 nerve root compression.  PROCEDURE:  Right L5-S1 semi-hemilaminectomy with right L5 and S2 foraminotomy decompression of the right lateral recess at the L5-S1 level.  SURGEON:  [name], M.D.  ASSISTANT:  Duncan Dull. Troncale, P.A.C.  ANESTHESIA:  GOT, Dr. Deirdre Peer.  ESTIMATED BLOOD LOSS:  10 cc.  DRAINS:  None.  BRIEF CLINICAL HISTORY:  The patient is a 70 year old female, with a long history of back pain with radiation to the left leg.  This has been worsening over the last six months with progressive neurogenic claudication.  The patient has undergone epidural steroid injections with only temporizing relief of pain.  She is brought to the operating room to undergo a right-sided foraminal decompression at the L5-S1 level.  Pain is in the lateral thigh, sometimes in the medial aspect of the thigh, and she is to undergo both decompression of the S1 nerve and L5 nerve roots.  INTRAOPERATIVE FINDINGS:  The patient was found to have impression on the S1 nerve root such that after performing lateral recess decompression, indentation of the S1 nerve root remained, an area of small blood vessels noted to be decreased in the area of compression.  However, there did not appear to be any significant ongoing compression following decompression.  The L5 nerve  root was also decompressed with the excision of the attachments to the ligamentum flavum superiorly to the undersurface of the L5 lamina as well as the reflected portion over the right L5-S1 facet.  DESCRIPTION OF PROCEDURE:  After adequate general anesthesia, the patient in knee-chest position, Andrews frame, standard preoperative antibiotics, standard prep DuraPrep solution and draped in the usual manner, iodine-impregnated Vi-Drape.  The patients incision initially approximately 3-3.5 cm in length through the skin and subcutaneous layers in the midline at the expected L5-S1 level.  Electrocautery used to divide the subcu layers down to the lumbodorsal fascia, and this was incised along the right side and then on the left side and clamp placed on the expected S1 spinous process. Intraoperative radiograph demonstrated this to be the S1 spinous process.  It was marked for the remaining portion of the case to be identified.  Two Cobbs were used to elevate the paralumbar muscles off the right side of the posterior aspect in the laminar space of the L5-S1 off the lamina of L5 and also posterior aspect of the lamina of S1.  McCullough retractor was inserted.  Excellent exposure obtained over the posterior aspect of the disk space, Leksell rongeur used to remove a small portion of the inferior aspect of the lamina of L5, 3 mm Kerrison then used to further resect a portion of the inferior aspect of the lamina on the right side at L5 up to the attachment of the ligamentum flavum.  This was then carefully retracted with a nerve hook and resected off of the inferior aspect of the lamina at L5, along the lateral aspects of the spinal canal, the medial aspect of the L5-S1 facet, performing a partial facetectomy of the right L5-S1 facet.  And this was done out to the level of the pedicle inferiorly.  Foraminotomy performed over the S1 nerve root.  S1 nerve root was noted to be indented.  There was  also evidence of synovial cyst with material extending off of the facet joint along the right side S1 nerve root, and this was resected off of the medial facet on the right side at L5-S1.  Irrigation was performed.  Hockey-stick nerve probe could be passed out at the S1 neural foramen without difficulty.  There was still some evidence of impingement by the superior articular portion and superior articular process of S1 within the neural foramen on the right side, and this was resected by varying the scope position and allow for oblique examination of the neural foramen.  Then resected portions of the resecting portions of the reflected ligamentum flavum and the superior aspect of the S1 superior articular process.  Following this, then the L5 nerve root was completely decompressed.  The hockey-stick nerve probe could be easily passed out the neural foramen without difficulty.  Examination of the L5-S1 disk demonstrated no protrusion or rupture present.  Exam of both nerve roots demonstrated no further compression.  Irrigation was performed.  Bleeders controlled using bipolar electrocautery.  Small portion of Gelfoam placed over the laminotomy defect.  The lumbodorsal fascia reapproximated in the midline with interrupted #1 Vicryl sutures, deep subcu layers approximated with interrupted 0 and #1 Vicryl sutures, more superficial layers with interrupted 2-0 Vicryl sutures, and the skin closed with a running subcu stitch of 4-0 Vicryl.  Tincture of Benzoin and Steri-Strips applied, then 4 x 4s affixed to the skin with Hypafix tape.  The patient then reactivated and returned to the recovery room in a satisfactory condition.  Note that the operating room microscope was used during this procedure.  It was brought into the field immediately following resection of a small portion of the inferior aspect of the lamina using Leksell rongeur and used throughout the decompression of the lateral recess  on the right side and foraminotomies.   All instrument and sponge counts were correct. Dictated by:   [name], M.D.  Attending Physician:  Madilyn Hook DD:  02/09/02 TD:  02/09/02 Job: [id number]

## 2011-03-23 NOTE — Op Note (Signed)
NAME:  Gloria Best, Gloria Best                        ACCOUNT NO.:  [id number]   MEDICAL RECORD NO.:  [id number]                   PATIENT TYPE:  AMB   LOCATION:  DAY                                  FACILITY:  [facility]   PHYSICIAN:  [name], M.D.                DATE OF BIRTH:  1941-01-05   DATE OF PROCEDURE:  07/24/2002  DATE OF DISCHARGE:                                 OPERATIVE REPORT   PREOPERATIVE DIAGNOSES:  Central and right sided L3-4 herniated nucleus  pulposus with right L3 and L4 nerve root compression.   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   PROCEDURE:  L3-4 central and right sided partial laminectomy subtotal with  right L3-4 lateral recess decompression and right L3-4 microdiskectomy.   SURGEON:  [name], M.D.   ASSISTANT:  Pedro Earls, P.A.-C.   ANESTHESIA:  GOT, Concepcion Living, M.D.   ESTIMATED BLOOD LOSS:  3 cc   DRAINS:  None.   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 She has, however,  developed progressive pain and claudication in the upper thigh in an area  different than her previous pain pattern. This pain entered the anterior  aspect of her leg and into her knee consistent with an upper lumbar  radiculopathy, positive signs of reverse straight leg raise. Post myelogram  CT scan demonstrating retrolisthesis at the L3-4 level. MRI demonstrating  apparent disk protrusion at L3-4 with compression of both the L3 and L4  nerve roots with some degree of lateral recess stenosis evident due to  retrolisthesis at L3 and L4.   INTRAOPERATIVE FINDINGS:  The patient was found to have  retrolisthesis at L3  and L4, right sided neuroforaminal narrowing affecting the right L3 nerve  root and compression of the right L4 nerve root secondary to lateral recess  stenosis due to hypertrophic ligamentum flavum changes as well as  hypertrophic joint changes.   DESCRIPTION OF PROCEDURE:  After adequate general anesthesia, the patient in  the knee chest position, Andrews frame, standard preoperative antibiotics,  standard prep with Duraprep solution, draped in the usual manner, Vidrape  was used. Needles placed at the expected L3 and L4 levels. Intraoperative  lateral radiograph demonstrated the upper needle at the upper end of L3,  lower needle at the L4 level. The incision made between these two needle  holes after infiltration with Marcaine 0.5% with 1:200,000 epinephrine  nearly 15 cc. The incision carried sharply through the skin and subcu layers  down to the lumbodorsal fascia in the midline. The incision then carried  over the spinous process at expected L3 and L4 level. Clamps placed over the  spinous processes here. Lateral intraoperative radiograph demonstrated the  upper clamp at the L2 level, lower clamp at the L3 level. Cobs were then  used to elevate the paralumbar muscles off the posterior aspect of the  elements at the L3 level both sides.   This was done exposing the L3-4 interlaminar space posteriorly as well as  debriding the lamina posteriorly of muscle attachments right and left side.  McCullough retractor was inserted. Because of the patient's scoliosis, the  spinous process overhanging the right side expected laminectomy site, it was  felt that removal of the spinous process was necessary as well as to examine  the neuroforamen adequately on the right side. This was done using a  Kerrison excising the spinous process of L3 centrally and completely. Then  thinning the posterior aspects of the lamina centrally at the L3 level. A 3  mm Kerrison was then able  to be introduced at the midline underneath the  lamina of L3 and then central laminectomy was carried out over 3/4 of the  length of the L3 lamina from inferior to superior. This was carried  primarily central towards the right side, left facet and left major portions  of lamina were left intact.   Bone removal was performed resecting approximately 30% of the medial aspect  of the right facet at the L3-4 level. Hypertrophic ligamentum flavum changes  were immediately noted and appeared to be impressing upon the thecal sac at  the right side L3-4 level. A nerve hook as well as cushings were used to  hold the ligamentum flavum away from the thecal sac and then this was  resected along the medial aspect of the facet using 3 and 2 mm Kerrisons.  Foraminotomy performed over the L4 nerve root using a 3 mm Kerrison and then  foraminotomy performed over the L3 nerve root using 3 mm and 2 mm Kerrisons.  A hockey stick nerve probe was used to protect the L3 nerve root while  performing foraminotomy, it was also used to examine the foraminotomy at the  L4 level so that the hockey stick could be easily passed out beneath the L4  and posterior to the L4 nerve root within the L4 neuroforamen. Likewise this  was able to be done at the L3 neuroforamen following foraminotomy. A  D'Errico retractor was then used to retract the thecal sac as well as the L4  nerve root to the midline. The small epidural veins were cauterized using  bipolar electrocautery, posterior aspect of the disk at the L3-4 level was  then examined and felt to be herniated centrally and towards the right side.  A 15 blade scalpel was used to incise the disk longitudinally,  micropituitaries were then used to excise the disk centrally in the right  side at the L3-4 level decompressing the right L3 neuroforamen as well as  the central portions of the canal and the right side at L3-4. Once the disk was removed posteriorly at this segments,  no attempts were made to remove  additional disk material from within the disk spaces. The disk space could  not be entered due to extreme collapse of the disk space. Irrigation was  performed. Again a hockey stick nerve probe could be passed out the L4 and  L3 neuroforamen without difficulty. Irrigation again performed. Careful  inspection demonstrated no active bleeding evident. Bone wax was applied to  bleeding cancellous bone surfaces that were hemostased. Excess bone wax  removed. Thrombin soaked Gelfoam then placed over the laminotomy defect  posteriorly. Deep subcu layers approximated loosely using interrupted #0  Vicryl sutures. The lumbodorsal fascia approximated in the midline with  interrupted #0 Vicryl sutures. The more superficial layers with interrupted  #0 and 2-0 Vicryl sutures and the skin closed with a running subcu stitch  with 4-0 Vicryl. Tinctured Benzoin and Steri-Strips applied and 4 x 4s  affixed to the skin with Hypofix tape. The patient was then returned to the  supine position, reactivated and extubated and returned to the recovery room  in satisfactory condition. All instrument and sponge counts were correct.                                               [name], M.D.    JEN/MEDQ  D:  07/24/2002  T:  07/25/2002  Job:  [id number]

## 2011-03-23 NOTE — Discharge Summary (Signed)
Gloria Best, Best              ACCOUNT NO.:  [id number]   MEDICAL RECORD NO.:  [id number]          PATIENT TYPE:  INP   LOCATION:  [id number]                         FACILITY:  [facility]   PHYSICIAN:  [name], M.D.   DATE OF BIRTH:  [birth year]   DATE OF ADMISSION:  03/06/2005  DATE OF DISCHARGE:  03/13/2005                                 DISCHARGE SUMMARY   ADMISSION DIAGNOSES:  1.  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 Area of degenerative      scoliosis extending from lumbar vertebrae-2 to sacrum.  2.  Hypertension.  3.  Rheumatoid arthritis.  4.  Iron-deficiency anemia.  5.  Gastroesophageal reflux disease.  6.  Hiatal hernia.   DISCHARGE DIAGNOSES:  1.  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 Area of degenerative scoliosis extending from      lumbar vertebrae-2 to sacrum.  2.  Intraoperative right dural tear lumbar vertebrae-4-5 level. Small bleb      or tear at the lumbar vertebrae-5 level on the right.  3.  Remaining diagnosis same as above including posthemorrhagic anemia.   PROCEDURE:  On Mar 06, 2005, the patient underwent central decompressive  laminectomy L2-3 at the level of facets L3-4, L4-5 and L5-S1 with  decompression of bilateral L2, bilateral L3, bilateral L4, bilateral L5, and  bilateral S1 nerve roots. Right L3-4 T-lift with right-sided autogenous  iliac crest bone graft. Left L4-5 T-lift with autogenous bone graft and  symphony graft material. Left L5, S1 T-lift with autogenous right iliac  crest bone graft as well as symphony bone graft. Posterolateral fusion from  L2 to sacrum with instrumentation from L2 to sacrum using Monarch pedicle  screws and rods. Dural repair  right side L4-5 as well as repair at the right  L5 level. This performed by Dr. Louanne Skye, assisted by Phillips Hay P.A.C.  under general anesthesia.   CONSULTATIONS:  [place].   BRIEF HISTORY:  Gloria Best is a 70 year old female with collapsing  degenerative scoliosis of the lumbar spine with spondylolisthesis of both L4-  5, L5-S1, retrolisthesis of L2-3 and L3-4. Conservative management including  multiple steroid injections have lasted for a number of years. Her recent  MRI's have demonstrated synovial cyst, moderate multifactorial stenosis of  L5-S1, left sided foraminal narrowing at L4-5 and at L5-S1. Also protruded  disk material into the left neural foramen at the L5-S1. Mild narrowing L2-  3, degenerative disk changes and facette changes at L1-2. Marked right sided  diskogenic change and osteophyte formation at L3-4 level affecting the  exiting right L3 nerve root.   The patient was admitted to undergo the procedure as stated above due to her  lack of results with treatment conservatively at this time.   BRIEF HOSPITAL COURSE:  The patient tolerated  the procedure under general  anesthesia. Postoperatively, she was placed at complete bedrest for 48 hours  secondary to her dural tear. Neurovascular motor function of the lower  extremities remained intact throughout the hospital stay. The patient did  have edema of her face, postoperatively; however, none of the conjunctiva.  This was felt to be from OR positioning. The patient was monitored closely  for postoperative ileus. She was treated with clear liquids and IV Reglan  until flatus and bowel movement were obtained. Initially, PCA medications  were utilized; however, the patient became oversedated and this was changed  to IV push medications and oral analgesics which were sufficient towards the  latter part of the hospital stay. The patient was fitted for a TLSO which  was donned and  doffed in the edge of the bed position. Occupational physical  therapy assisted the patient with activity during the hospital stay. She was  quite slow to progress with her activity and a rehabilitation consult was  obtained. She was felt to be a suitable candidate for inpatient  rehabilitation. She was placed on the bed waiting list and continued to  receive physical therapy daily until a bed was noted to be available.   The patient was placed on Lovenox for DVT prophylaxis. The pharmacy protocol  for [facility] was followed. She was also resumed on her Epogen for  her anemia. The patient was noted to have some urinary retention on Mar 13, 2005. Foley catheter was placed. She did have a significant history for  urinary retention following previous colon cancer. On Mar 13, 2005, a bed was  available rehabilitation. As she did have a Foley catheter and was stable  from all other standpoints, it was felt she would be ready for transfer to  utilize the bed and continue with her inpatient rehabilitation. Dressing  changes were done throughout the hospital stay. Small amounts of serous  drainage were noted on her dressing but no active drainage.   Urine cultures from Mar 09, 2005 showed no growth. Chemistry studies on  admission were within normal limits. BUN noted to be 40 on admission without  albumin 3.4. The patient had episodes of hyponatremia which was corrected  with adjustments in IV fluids. Potassium was lowest at 3.3 and treated with  oral supplementation. Coagulation studies on admission were within normal  limits. Preoperative CBC with WBC 16.1, hemoglobin 12.3, hematocrit 37.0.  Hemoglobin fluctuated dropping to the lowest value of 8.2. The patient did  receive 2 units of packed red blood cells during the hospital stay. At the  time of discharge, hemoglobin and hematocrit were 9.7 and 29.3 respectively. The patient was noted to have an elevated white count at 26.9 at the  time of  transfer to rehabilitation.   PLAN:  The patient was transferred to the inpatient rehabilitation area of  the hospital. There she will continue to receive physical therapy for  ambulation and gait training. She will wear her TLSO at all times. It may be  donned and doffed at the edge of the bed. Dressing changes will be done with  a clean dressing placed daily. The patient will continue working with  occupational therapy for ADL's. She did complain of significant knee pain  and Dr. Louanne Skye did restart her Celebrex. A list of her medications were sent  with her to the rehabilitation center for continuation of these medications.  The patient should have daily dressing changes. She will continue on a  regular diet. Her urinary retention will be addressed while at the  rehabilitation center with voiding trials done once her Foley catheter is  discontinued. The patient will follow up with Dr. Louanne Skye following her stay  at the rehabilitation.   CONDITION ON DISCHARGE:  Stable.     Epimenio Foot, P.A.      [name], M.D.  Electronically Signed   SMV/MEDQ  D:  08/02/2005  T:  08/02/2005  Job:  [id number]

## 2011-03-23 NOTE — Op Note (Signed)
NAME:  Gloria Best, Gloria Best                        ACCOUNT NO.:  [id number]   MEDICAL RECORD NO.:  [id number]                   PATIENT TYPE:  AMB   LOCATION:  ENDO                                 FACILITY:  [facility]   PHYSICIAN:  Waverly Ferrari, M.D.                 DATE OF BIRTH:  1940/11/29   DATE OF PROCEDURE:  DATE OF DISCHARGE:                                 OPERATIVE REPORT   PROCEDURE:  Colonoscopy.   INDICATIONS:  Colon polyps.   ANESTHESIA:  Demerol 40 mg, Versed 3 mg.   DESCRIPTION OF PROCEDURE:  With the patient mildly sedated and in the left  lateral decubitus position, the Olympus videoscopic colonoscope was inserted  in the rectum and passed under direct vision to the neocecum, identified by  what appeared to be anastomosis site with the small bowel which was  photographed.  From this point the colonoscope was slowly withdrawn, taking  circumferential views of the colonic mucosa, stopping in the rectum which  appeared normal on direct and showed normal on the retroflexed view.  The  endoscope was straightened and withdrawn.  The patient's vital signs and  pulse oximetry remained stable.  The patient tolerated the procedure well  without apparent complications.   FINDINGS:  Unremarkable colonoscopic examination to the neocecum.   PLAN:  Repeat examination possibly in five years.                                               Waverly Ferrari, M.D.    GMO/MEDQ  D:  12/13/2003  T:  12/13/2003  Job:  [id number]

## 2011-11-08 DIAGNOSIS — M069 Rheumatoid arthritis, unspecified: Secondary | ICD-10-CM | POA: Diagnosis not present

## 2011-11-08 DIAGNOSIS — Z79899 Other long term (current) drug therapy: Secondary | ICD-10-CM | POA: Diagnosis not present

## 2011-11-12 DIAGNOSIS — D518 Other vitamin B12 deficiency anemias: Secondary | ICD-10-CM | POA: Diagnosis not present

## 2011-11-12 DIAGNOSIS — I498 Other specified cardiac arrhythmias: Secondary | ICD-10-CM | POA: Diagnosis not present

## 2011-11-12 DIAGNOSIS — J441 Chronic obstructive pulmonary disease with (acute) exacerbation: Secondary | ICD-10-CM | POA: Diagnosis not present

## 2011-11-15 DIAGNOSIS — J449 Chronic obstructive pulmonary disease, unspecified: Secondary | ICD-10-CM | POA: Diagnosis not present

## 2011-11-19 DIAGNOSIS — L97409 Non-pressure chronic ulcer of unspecified heel and midfoot with unspecified severity: Secondary | ICD-10-CM | POA: Diagnosis not present

## 2011-11-22 DIAGNOSIS — M545 Low back pain: Secondary | ICD-10-CM | POA: Diagnosis not present

## 2011-11-22 DIAGNOSIS — M169 Osteoarthritis of hip, unspecified: Secondary | ICD-10-CM | POA: Diagnosis not present

## 2011-11-22 DIAGNOSIS — M4000 Postural kyphosis, site unspecified: Secondary | ICD-10-CM | POA: Diagnosis not present

## 2011-11-22 DIAGNOSIS — M76899 Other specified enthesopathies of unspecified lower limb, excluding foot: Secondary | ICD-10-CM | POA: Diagnosis not present

## 2011-11-23 DIAGNOSIS — G4733 Obstructive sleep apnea (adult) (pediatric): Secondary | ICD-10-CM | POA: Diagnosis not present

## 2011-11-24 DIAGNOSIS — K219 Gastro-esophageal reflux disease without esophagitis: Secondary | ICD-10-CM | POA: Diagnosis present

## 2011-11-24 DIAGNOSIS — D649 Anemia, unspecified: Secondary | ICD-10-CM | POA: Diagnosis not present

## 2011-11-24 DIAGNOSIS — E785 Hyperlipidemia, unspecified: Secondary | ICD-10-CM | POA: Diagnosis present

## 2011-11-24 DIAGNOSIS — R110 Nausea: Secondary | ICD-10-CM | POA: Diagnosis not present

## 2011-11-24 DIAGNOSIS — E876 Hypokalemia: Secondary | ICD-10-CM | POA: Diagnosis not present

## 2011-11-24 DIAGNOSIS — E119 Type 2 diabetes mellitus without complications: Secondary | ICD-10-CM | POA: Diagnosis not present

## 2011-11-24 DIAGNOSIS — Z9181 History of falling: Secondary | ICD-10-CM | POA: Diagnosis not present

## 2011-11-24 DIAGNOSIS — D638 Anemia in other chronic diseases classified elsewhere: Secondary | ICD-10-CM | POA: Diagnosis present

## 2011-11-24 DIAGNOSIS — R7989 Other specified abnormal findings of blood chemistry: Secondary | ICD-10-CM | POA: Diagnosis not present

## 2011-11-24 DIAGNOSIS — I059 Rheumatic mitral valve disease, unspecified: Secondary | ICD-10-CM | POA: Diagnosis not present

## 2011-11-24 DIAGNOSIS — E1149 Type 2 diabetes mellitus with other diabetic neurological complication: Secondary | ICD-10-CM | POA: Diagnosis not present

## 2011-11-24 DIAGNOSIS — N179 Acute kidney failure, unspecified: Secondary | ICD-10-CM | POA: Diagnosis not present

## 2011-11-24 DIAGNOSIS — J189 Pneumonia, unspecified organism: Secondary | ICD-10-CM | POA: Diagnosis not present

## 2011-11-24 DIAGNOSIS — J449 Chronic obstructive pulmonary disease, unspecified: Secondary | ICD-10-CM | POA: Diagnosis present

## 2011-11-24 DIAGNOSIS — B952 Enterococcus as the cause of diseases classified elsewhere: Secondary | ICD-10-CM | POA: Diagnosis present

## 2011-11-24 DIAGNOSIS — E1142 Type 2 diabetes mellitus with diabetic polyneuropathy: Secondary | ICD-10-CM | POA: Diagnosis not present

## 2011-11-24 DIAGNOSIS — A419 Sepsis, unspecified organism: Secondary | ICD-10-CM | POA: Diagnosis not present

## 2011-11-24 DIAGNOSIS — I4891 Unspecified atrial fibrillation: Secondary | ICD-10-CM | POA: Diagnosis not present

## 2011-11-24 DIAGNOSIS — J9600 Acute respiratory failure, unspecified whether with hypoxia or hypercapnia: Secondary | ICD-10-CM | POA: Diagnosis not present

## 2011-11-24 DIAGNOSIS — N183 Chronic kidney disease, stage 3 unspecified: Secondary | ICD-10-CM | POA: Diagnosis not present

## 2011-11-24 DIAGNOSIS — B370 Candidal stomatitis: Secondary | ICD-10-CM | POA: Diagnosis not present

## 2011-11-24 DIAGNOSIS — E860 Dehydration: Secondary | ICD-10-CM | POA: Diagnosis not present

## 2011-11-24 DIAGNOSIS — R55 Syncope and collapse: Secondary | ICD-10-CM | POA: Diagnosis not present

## 2011-11-24 DIAGNOSIS — N390 Urinary tract infection, site not specified: Secondary | ICD-10-CM | POA: Diagnosis present

## 2011-11-24 DIAGNOSIS — I129 Hypertensive chronic kidney disease with stage 1 through stage 4 chronic kidney disease, or unspecified chronic kidney disease: Secondary | ICD-10-CM | POA: Diagnosis present

## 2011-11-24 DIAGNOSIS — R1013 Epigastric pain: Secondary | ICD-10-CM | POA: Diagnosis not present

## 2011-11-24 DIAGNOSIS — G4733 Obstructive sleep apnea (adult) (pediatric): Secondary | ICD-10-CM | POA: Diagnosis not present

## 2011-11-24 DIAGNOSIS — E871 Hypo-osmolality and hyponatremia: Secondary | ICD-10-CM | POA: Diagnosis not present

## 2011-12-03 DIAGNOSIS — G4733 Obstructive sleep apnea (adult) (pediatric): Secondary | ICD-10-CM | POA: Diagnosis not present

## 2011-12-10 DIAGNOSIS — E871 Hypo-osmolality and hyponatremia: Secondary | ICD-10-CM | POA: Diagnosis not present

## 2011-12-10 DIAGNOSIS — R112 Nausea with vomiting, unspecified: Secondary | ICD-10-CM | POA: Diagnosis not present

## 2011-12-10 DIAGNOSIS — I4891 Unspecified atrial fibrillation: Secondary | ICD-10-CM | POA: Diagnosis not present

## 2011-12-10 DIAGNOSIS — J449 Chronic obstructive pulmonary disease, unspecified: Secondary | ICD-10-CM | POA: Diagnosis not present

## 2011-12-10 DIAGNOSIS — K5641 Fecal impaction: Secondary | ICD-10-CM | POA: Diagnosis not present

## 2011-12-10 DIAGNOSIS — D649 Anemia, unspecified: Secondary | ICD-10-CM | POA: Diagnosis not present

## 2011-12-10 DIAGNOSIS — D72829 Elevated white blood cell count, unspecified: Secondary | ICD-10-CM | POA: Diagnosis not present

## 2011-12-10 DIAGNOSIS — R109 Unspecified abdominal pain: Secondary | ICD-10-CM | POA: Diagnosis not present

## 2011-12-10 DIAGNOSIS — E119 Type 2 diabetes mellitus without complications: Secondary | ICD-10-CM | POA: Diagnosis not present

## 2011-12-10 DIAGNOSIS — K56609 Unspecified intestinal obstruction, unspecified as to partial versus complete obstruction: Secondary | ICD-10-CM | POA: Diagnosis not present

## 2011-12-10 DIAGNOSIS — K560 Paralytic ileus: Secondary | ICD-10-CM | POA: Diagnosis not present

## 2011-12-10 DIAGNOSIS — K5900 Constipation, unspecified: Secondary | ICD-10-CM | POA: Diagnosis not present

## 2011-12-11 DIAGNOSIS — R112 Nausea with vomiting, unspecified: Secondary | ICD-10-CM | POA: Diagnosis not present

## 2011-12-11 DIAGNOSIS — I4891 Unspecified atrial fibrillation: Secondary | ICD-10-CM | POA: Diagnosis present

## 2011-12-11 DIAGNOSIS — R109 Unspecified abdominal pain: Secondary | ICD-10-CM | POA: Diagnosis not present

## 2011-12-11 DIAGNOSIS — N189 Chronic kidney disease, unspecified: Secondary | ICD-10-CM | POA: Diagnosis present

## 2011-12-11 DIAGNOSIS — E119 Type 2 diabetes mellitus without complications: Secondary | ICD-10-CM | POA: Diagnosis present

## 2011-12-11 DIAGNOSIS — K5900 Constipation, unspecified: Secondary | ICD-10-CM | POA: Diagnosis not present

## 2011-12-11 DIAGNOSIS — K560 Paralytic ileus: Secondary | ICD-10-CM | POA: Diagnosis present

## 2011-12-11 DIAGNOSIS — D649 Anemia, unspecified: Secondary | ICD-10-CM | POA: Diagnosis present

## 2011-12-11 DIAGNOSIS — K5641 Fecal impaction: Secondary | ICD-10-CM | POA: Diagnosis present

## 2011-12-11 DIAGNOSIS — E876 Hypokalemia: Secondary | ICD-10-CM | POA: Diagnosis not present

## 2011-12-11 DIAGNOSIS — Z7901 Long term (current) use of anticoagulants: Secondary | ICD-10-CM | POA: Diagnosis not present

## 2011-12-11 DIAGNOSIS — I129 Hypertensive chronic kidney disease with stage 1 through stage 4 chronic kidney disease, or unspecified chronic kidney disease: Secondary | ICD-10-CM | POA: Diagnosis present

## 2011-12-11 DIAGNOSIS — J449 Chronic obstructive pulmonary disease, unspecified: Secondary | ICD-10-CM | POA: Diagnosis not present

## 2011-12-11 DIAGNOSIS — E871 Hypo-osmolality and hyponatremia: Secondary | ICD-10-CM | POA: Diagnosis present

## 2011-12-11 DIAGNOSIS — K56609 Unspecified intestinal obstruction, unspecified as to partial versus complete obstruction: Secondary | ICD-10-CM | POA: Diagnosis not present

## 2011-12-11 DIAGNOSIS — J189 Pneumonia, unspecified organism: Secondary | ICD-10-CM | POA: Diagnosis not present

## 2011-12-11 DIAGNOSIS — J4489 Other specified chronic obstructive pulmonary disease: Secondary | ICD-10-CM | POA: Diagnosis present

## 2011-12-11 DIAGNOSIS — D72829 Elevated white blood cell count, unspecified: Secondary | ICD-10-CM | POA: Diagnosis not present

## 2011-12-21 DIAGNOSIS — J189 Pneumonia, unspecified organism: Secondary | ICD-10-CM | POA: Diagnosis not present

## 2011-12-21 DIAGNOSIS — R5381 Other malaise: Secondary | ICD-10-CM | POA: Diagnosis not present

## 2011-12-26 DIAGNOSIS — M069 Rheumatoid arthritis, unspecified: Secondary | ICD-10-CM | POA: Diagnosis not present

## 2011-12-26 DIAGNOSIS — E538 Deficiency of other specified B group vitamins: Secondary | ICD-10-CM | POA: Diagnosis not present

## 2011-12-26 DIAGNOSIS — D638 Anemia in other chronic diseases classified elsewhere: Secondary | ICD-10-CM | POA: Diagnosis not present

## 2011-12-26 DIAGNOSIS — E876 Hypokalemia: Secondary | ICD-10-CM | POA: Diagnosis not present

## 2011-12-26 DIAGNOSIS — N189 Chronic kidney disease, unspecified: Secondary | ICD-10-CM | POA: Diagnosis not present

## 2011-12-26 DIAGNOSIS — D473 Essential (hemorrhagic) thrombocythemia: Secondary | ICD-10-CM | POA: Diagnosis not present

## 2011-12-26 DIAGNOSIS — E119 Type 2 diabetes mellitus without complications: Secondary | ICD-10-CM | POA: Diagnosis not present

## 2012-01-02 DIAGNOSIS — M1610 Unilateral primary osteoarthritis, unspecified hip: Secondary | ICD-10-CM | POA: Diagnosis not present

## 2012-01-08 DIAGNOSIS — J189 Pneumonia, unspecified organism: Secondary | ICD-10-CM | POA: Diagnosis not present

## 2012-01-09 DIAGNOSIS — R609 Edema, unspecified: Secondary | ICD-10-CM | POA: Diagnosis not present

## 2012-01-09 DIAGNOSIS — M79609 Pain in unspecified limb: Secondary | ICD-10-CM | POA: Diagnosis not present

## 2012-01-14 DIAGNOSIS — L03119 Cellulitis of unspecified part of limb: Secondary | ICD-10-CM | POA: Diagnosis not present

## 2012-01-16 DIAGNOSIS — M76899 Other specified enthesopathies of unspecified lower limb, excluding foot: Secondary | ICD-10-CM | POA: Diagnosis not present

## 2012-01-16 DIAGNOSIS — R39198 Other difficulties with micturition: Secondary | ICD-10-CM | POA: Diagnosis not present

## 2012-01-16 DIAGNOSIS — R339 Retention of urine, unspecified: Secondary | ICD-10-CM | POA: Diagnosis not present

## 2012-01-16 DIAGNOSIS — M545 Low back pain: Secondary | ICD-10-CM | POA: Diagnosis not present

## 2012-01-16 DIAGNOSIS — M169 Osteoarthritis of hip, unspecified: Secondary | ICD-10-CM | POA: Diagnosis not present

## 2012-01-25 DIAGNOSIS — L500 Allergic urticaria: Secondary | ICD-10-CM | POA: Diagnosis not present

## 2012-01-25 DIAGNOSIS — D518 Other vitamin B12 deficiency anemias: Secondary | ICD-10-CM | POA: Diagnosis not present

## 2012-01-30 DIAGNOSIS — R339 Retention of urine, unspecified: Secondary | ICD-10-CM | POA: Diagnosis not present

## 2012-01-30 DIAGNOSIS — N1330 Unspecified hydronephrosis: Secondary | ICD-10-CM | POA: Diagnosis not present

## 2012-02-14 DIAGNOSIS — M069 Rheumatoid arthritis, unspecified: Secondary | ICD-10-CM | POA: Diagnosis not present

## 2012-03-13 DIAGNOSIS — IMO0001 Reserved for inherently not codable concepts without codable children: Secondary | ICD-10-CM | POA: Diagnosis not present

## 2012-03-13 DIAGNOSIS — M069 Rheumatoid arthritis, unspecified: Secondary | ICD-10-CM | POA: Diagnosis not present

## 2012-03-13 DIAGNOSIS — M159 Polyosteoarthritis, unspecified: Secondary | ICD-10-CM | POA: Diagnosis not present

## 2012-03-14 DIAGNOSIS — M069 Rheumatoid arthritis, unspecified: Secondary | ICD-10-CM | POA: Diagnosis not present

## 2012-04-02 DIAGNOSIS — M545 Low back pain: Secondary | ICD-10-CM | POA: Diagnosis not present

## 2012-04-02 DIAGNOSIS — M76899 Other specified enthesopathies of unspecified lower limb, excluding foot: Secondary | ICD-10-CM | POA: Diagnosis not present

## 2012-04-02 DIAGNOSIS — M169 Osteoarthritis of hip, unspecified: Secondary | ICD-10-CM | POA: Diagnosis not present

## 2012-04-03 ENCOUNTER — Other Ambulatory Visit: Payer: Self-pay | Admitting: Specialist

## 2012-04-03 DIAGNOSIS — R29898 Other symptoms and signs involving the musculoskeletal system: Secondary | ICD-10-CM

## 2012-04-07 DIAGNOSIS — M25559 Pain in unspecified hip: Secondary | ICD-10-CM | POA: Diagnosis not present

## 2012-04-07 DIAGNOSIS — M169 Osteoarthritis of hip, unspecified: Secondary | ICD-10-CM | POA: Diagnosis not present

## 2012-04-10 ENCOUNTER — Ambulatory Visit
Admission: RE | Admit: 2012-04-10 | Discharge: 2012-04-10 | Disposition: A | Discharge status: Home / Self Care | Payer: Medicare Other | Source: Ambulatory Visit | Attending: Specialist | Admitting: Specialist

## 2012-04-10 VITALS — BP 105/47 | HR 54

## 2012-04-10 DIAGNOSIS — R29898 Other symptoms and signs involving the musculoskeletal system: Secondary | ICD-10-CM

## 2012-04-10 DIAGNOSIS — M5126 Other intervertebral disc displacement, lumbar region: Secondary | ICD-10-CM | POA: Diagnosis not present

## 2012-04-10 DIAGNOSIS — M25559 Pain in unspecified hip: Secondary | ICD-10-CM | POA: Diagnosis not present

## 2012-04-10 MED ORDER — DIAZEPAM 5 MG PO TABS
5.0000 mg | ORAL_TABLET | Freq: Once | ORAL | Status: AC
  Administered 2012-04-10: 5 mg via ORAL

## 2012-04-10 MED ORDER — MEPERIDINE HCL 100 MG/ML IJ SOLN
50.0000 mg | Freq: Once | INTRAMUSCULAR | Status: AC
  Administered 2012-04-10: 50 mg via INTRAMUSCULAR

## 2012-04-10 MED ORDER — ONDANSETRON HCL 4 MG/2ML IJ SOLN
4.0000 mg | Freq: Once | INTRAMUSCULAR | Status: AC
  Administered 2012-04-10: 4 mg via INTRAMUSCULAR

## 2012-04-10 MED ORDER — IOHEXOL 180 MG/ML  SOLN
15.0000 mL | Freq: Once | INTRAMUSCULAR | Status: AC | PRN
  Administered 2012-04-10: 15 mL via INTRATHECAL

## 2012-04-10 NOTE — Discharge Instructions (Signed)
Myelogram Discharge Instructions  1. Go home and rest quietly for the next 24 hours.  It is important to lie flat for the next 24 hours.  Get up only to go to the restroom.  You may lie in the bed or on a couch on your back, your stomach, your left side or your right side.  You may have one pillow under your head.  You may have pillows between your knees while you are on your side or under your knees while you are on your back.  2. DO NOT drive today.  Recline the seat as far back as it will go, while still wearing your seat belt, on the way home.  3. You may get up to go to the bathroom as needed.  You may sit up for 10 minutes to eat.  You may resume your normal diet and medications unless otherwise indicated.  Drink plenty of extra fluids today and tomorrow.  4. The incidence of a spinal headache with nausea and/or vomiting is about 5% (one in 20 patients).  If you develop a headache, lie flat and drink plenty of fluids until the headache goes away.  Caffeinated beverages may be helpful.  If you develop severe nausea and vomiting or a headache that does not go away with flat bed rest, call [phone].  5. You may resume normal activities after your 24 hours of bed rest is over; however, do not exert yourself strongly or do any heavy lifting tomorrow.  6. Call your physician for a follow-up appointment.    You may resume Pradaxa today.  You may resume Lexapro and Abilify on Friday, April 11, 2012 after 1:00p.m.

## 2012-04-10 NOTE — Progress Notes (Signed)
Patient states she has been off Pradaxa, Abilify and Lexapro for the past two days.  Discharge instructions explained to patient.  jkl

## 2012-04-25 DIAGNOSIS — M069 Rheumatoid arthritis, unspecified: Secondary | ICD-10-CM | POA: Diagnosis not present

## 2012-04-30 DIAGNOSIS — M169 Osteoarthritis of hip, unspecified: Secondary | ICD-10-CM | POA: Diagnosis not present

## 2012-04-30 DIAGNOSIS — M545 Low back pain: Secondary | ICD-10-CM | POA: Diagnosis not present

## 2012-04-30 DIAGNOSIS — M25559 Pain in unspecified hip: Secondary | ICD-10-CM | POA: Diagnosis not present

## 2012-05-01 DIAGNOSIS — R339 Retention of urine, unspecified: Secondary | ICD-10-CM | POA: Diagnosis not present

## 2012-05-01 DIAGNOSIS — N1330 Unspecified hydronephrosis: Secondary | ICD-10-CM | POA: Diagnosis not present

## 2012-05-02 DIAGNOSIS — E782 Mixed hyperlipidemia: Secondary | ICD-10-CM | POA: Diagnosis not present

## 2012-05-02 DIAGNOSIS — D518 Other vitamin B12 deficiency anemias: Secondary | ICD-10-CM | POA: Diagnosis not present

## 2012-05-02 DIAGNOSIS — I10 Essential (primary) hypertension: Secondary | ICD-10-CM | POA: Diagnosis not present

## 2012-05-02 DIAGNOSIS — E1149 Type 2 diabetes mellitus with other diabetic neurological complication: Secondary | ICD-10-CM | POA: Diagnosis not present

## 2012-05-23 ENCOUNTER — Encounter (HOSPITAL_COMMUNITY): Payer: Self-pay

## 2012-05-29 DIAGNOSIS — R339 Retention of urine, unspecified: Secondary | ICD-10-CM | POA: Diagnosis not present

## 2012-05-29 DIAGNOSIS — R39198 Other difficulties with micturition: Secondary | ICD-10-CM | POA: Diagnosis not present

## 2012-05-29 NOTE — Consult Note (Addendum)
Anesthesia Note:  Patient is a 71 year old female scheduled for right THA on 06/09/12.  Her PAT visit in on 06/04/12.  On 05/29/12 I reviewed her clearance notes from her PCP Dr. Marco Collie and Urologist Dr. Junious Silk.    Her history includes OSA, DM2, anxiety, depression, paroxysmal afib, colon cancer, GERD, PUD, HTN, murmur,  hypercholesterolemia, urinary retention requiring clean intermittent catheterization.    I spoke with patient over the telephone.  She denied CAD/MI/CHF.  Reported one episode of afib with hospitalization ~ 5 years ago at [facility]).  She denied history of a stress test or cath.  She does not see a Cardiologist.  Records from Dr. Nyra Capes do not indicate any specific CAD or arrhythmia history.  She had an EKG ([facility]) on 12/11/11 that showed NSR.    [facility]) on 11/24/11 showed normal LV function, EF [id number], normal diastolic filling pattern, mild MI, mild TI, upper limits of normal pulmonary pressure (35 mmHg).  CXR on 01/08/12 [facility]) showed no acute cardiopulmonary abnormalities.  Labs won't be drawn until her PAT appointment.  Myra Gianotti, PA-C 06/02/12 1043

## 2012-06-04 ENCOUNTER — Other Ambulatory Visit (HOSPITAL_COMMUNITY): Payer: Self-pay | Admitting: Orthopedic Surgery

## 2012-06-04 ENCOUNTER — Encounter (HOSPITAL_COMMUNITY)
Admission: RE | Admit: 2012-06-04 | Discharge: 2012-06-04 | Payer: Medicare Other | Source: Ambulatory Visit | Attending: Specialist | Admitting: Specialist

## 2012-06-04 DIAGNOSIS — R339 Retention of urine, unspecified: Secondary | ICD-10-CM | POA: Diagnosis not present

## 2012-06-04 DIAGNOSIS — N3941 Urge incontinence: Secondary | ICD-10-CM | POA: Diagnosis not present

## 2012-06-04 NOTE — Pre-Procedure Instructions (Signed)
[name]  06/04/2012   Your procedure is scheduled on:  06/09/12  Report to [place] at 530 AM.  Call this number if you have problems the morning of surgery: [phone]   Remember:   Do not eat food or drink:After Midnight.  May Take these medicines the morning of surgery with A SIP OF WATER:abilify, clonidine, diltiazem,lexapro, nexium, ultram  STOP pradexa now   Do not wear jewelry, make-up or nail polish.  Do not wear lotions, powders, or perfumes. You may wear deodorant.  Do not shave 48 hours prior to surgery. Men may shave face and neck.  Do not bring valuables to the hospital.  Contacts, dentures or bridgework may not be worn into surgery.  Leave suitcase in the car. After surgery it may be brought to your room.  For patients admitted to the hospital, checkout time is 11:00 AM the day of discharge.   Patients discharged the day of surgery will not be allowed to drive home.  Name and phone number of your driver: *  Special Instructions: Incentive Spirometry - Practice and bring it with you on the day of surgery. and CHG Shower Use Special Wash: 1/2 bottle night before surgery and 1/2 bottle morning of surgery.   Please read over the following fact sheets that you were given: Pain Booklet, Coughing and Deep Breathing, Blood Transfusion Information, Total Joint Packet, MRSA Information and Surgical Site Infection Prevention

## 2012-06-05 NOTE — H&P (Signed)
Gloria Best is an 71 y.o. female.   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  MRI scan has shown collapse of the superior aspect of the right femoral head.  Pt had one intra articular injection to the right hip which gave her relief but only for a couple of days.  She has difficulty with ADL's because of her hip pain and limitations with walking as well. Pt wishes to proceed with total hip replacement.  She has been evaluated by her PCP Dr Nyra Capes and cleared for surgical intervention.  Her Pradaxa was stopped 2 weeks pre op and she will resume this post op as indicated by Dr Nyra Capes.  Past Medical History  Diagnosis Date  . Complication of anesthesia     "some difficulty waking up"  . Dysrhythmia     takes cardizem, sees Dr. Marco Collie primary  . Heart murmur   . Depression     takes medication for   . Asthma     hx of as child  . Pneumonia     hx of  . Bronchitis     hx of  . Diabetes mellitus     oral medication  . Bladder disorder     having difficulty voiding, requires in and out catheter 2x day and prn  . Urinary tract infection     hx of  . H/O hiatal hernia     hx of   . GERD (gastroesophageal reflux disease)   . Carpal tunnel syndrome of left wrist     hx of  . Arthritis     RA  . Fibromyalgia     takes lyrica  . Anemia     hx of blood transfusion  . Chronic back pain   . Cataracts, bilateral     hx of    Past Surgical History  Procedure Date  . Cataract extraction     bilateral  . Back surgery      x 3  . Abdominal hysterectomy   . Hernia repair   . Cesarean section   . Colon surgery     "due to large polyp"  . Foot surgery   . Carpal tunnel release     left side only  . Appendectomy     No family history on file. Social History:  reports that she has quit smoking. She does not have any smokeless tobacco history on file. She reports that  she does not drink alcohol or use illicit drugs.   Allergies:  Allergies  Allergen Reactions  . Adhesive (Tape) Other (See Comments)    Tears skin  . Lotrel (Amlodipine Besy-Benazepril Hcl) Itching and Cough    No prescriptions prior to admission    Results for orders placed during the hospital encounter of 06/06/12 (from the past 48 hour(s))  URINALYSIS, ROUTINE W REFLEX MICROSCOPIC     Status: Abnormal   Collection Time   06/06/12  2:33 PM      Component Value Range Comment   Color, Urine YELLOW  YELLOW    APPearance HAZY (*) CLEAR    Specific Gravity, Urine 1.011  1.005 - 1.030    pH 6.5  5.0 - 8.0    Glucose, UA NEGATIVE  NEGATIVE mg/dL    Hgb urine dipstick TRACE (*) NEGATIVE    Bilirubin Urine NEGATIVE  NEGATIVE    Ketones, ur NEGATIVE  NEGATIVE  mg/dL    Protein, ur NEGATIVE  NEGATIVE mg/dL    Urobilinogen, UA 0.2  0.0 - 1.0 mg/dL    Nitrite NEGATIVE  NEGATIVE    Leukocytes, UA LARGE (*) NEGATIVE   SURGICAL PCR SCREEN     Status: Normal   Collection Time   06/06/12  2:33 PM      Component Value Range Comment   MRSA, PCR NEGATIVE  NEGATIVE    Staphylococcus aureus NEGATIVE  NEGATIVE   URINE MICROSCOPIC-ADD ON     Status: Abnormal   Collection Time   06/06/12  2:33 PM      Component Value Range Comment   Squamous Epithelial / LPF FEW (*) RARE    WBC, UA TOO NUMEROUS TO COUNT  <3 WBC/hpf    RBC / HPF 0-2  <3 RBC/hpf    Bacteria, UA FEW (*) RARE   TYPE AND SCREEN     Status: Normal   Collection Time   06/06/12  2:40 PM      Component Value Range Comment   ABO/RH(D) O POS      Antibody Screen NEG      Sample Expiration 06/20/2012     BASIC METABOLIC PANEL     Status: Abnormal   Collection Time   06/06/12  2:45 PM      Component Value Range Comment   Sodium 139  135 - 145 mEq/L    Potassium 3.5  3.5 - 5.1 mEq/L    Chloride 95 (*) 96 - 112 mEq/L    CO2 29  19 - 32 mEq/L    Glucose, Bld 137 (*) 70 - 99 mg/dL    BUN 29 (*) 6 - 23 mg/dL    Creatinine, Ser 1.36 (*) 0.50  - 1.10 mg/dL    Calcium 9.9  8.4 - 10.5 mg/dL    GFR calc non Af Amer 38 (*) >90 mL/min    GFR calc Af Amer 44 (*) >90 mL/min   CBC     Status: Abnormal   Collection Time   06/06/12  2:45 PM      Component Value Range Comment   WBC 14.3 (*) 4.0 - 10.5 K/uL    RBC 4.75  3.87 - 5.11 MIL/uL    Hemoglobin 12.6  12.0 - 15.0 g/dL    HCT 38.3  36.0 - 46.0 %    MCV 80.6  78.0 - 100.0 fL    MCH 26.5  26.0 - 34.0 pg    MCHC 32.9  30.0 - 36.0 g/dL    RDW 17.5 (*) 11.5 - 15.5 %    Platelets 350  150 - 400 K/uL    Dg Chest 2 View  06/06/2012  *RADIOLOGY REPORT*  Clinical Data: Preop for right hip arthroplasty  CHEST - 2 VIEW  Comparison: 01/08/2012  Findings: Cardiomediastinal silhouette is stable.  Mild thoracic dextroscoliosis.  No acute infiltrate or pleural effusion. Postsurgical changes with metallic fixation material noted lumbar spine. No pulmonary edema.  IMPRESSION: No active disease.  No significant change.  Original Report Authenticated By: Lahoma Crocker, M.D.    Review of Systems  Constitutional: Negative.   HENT: Negative.   Eyes: Negative.   Respiratory: Negative.   Cardiovascular: Negative.   Gastrointestinal: Negative.   Genitourinary:       Pt with neurogenic bladder and is currently on a catheterization program.  Her daughter help her and she [place] cathed usually 4 times a day.  Often has full loss of urine without  warning.  Currently under treatment and evaluation by [facility]  Musculoskeletal: Positive for joint pain.  Skin: Negative.   Neurological: Negative.   Endo/Heme/Allergies: Negative.   Psychiatric/Behavioral: Positive for depression.    There were no vitals taken for this visit. Physical Exam  Constitutional: She is oriented to person, place, and time. She appears well-developed and well-nourished.  HENT:  Head: Normocephalic and atraumatic.  Eyes: EOM are normal. Pupils are equal, round, and reactive to light.  Neck: Normal range of motion. Neck supple.   Cardiovascular: Intact distal pulses.   Murmur heard.      Irregularly irregular rate and rhythm. Grade II/IV systolic murmur heard best at left sternal border  Respiratory: Effort normal and breath sounds normal.  GI: Soft. Bowel sounds are normal.  Musculoskeletal:       Painful and limited ROM with right hip.  Pain with weight bearing on right with antalgic gait. No edema of lower extremities.  Neurological: She is alert and oriented to person, place, and time.  Skin: Skin is warm and dry.  Psychiatric: She has a normal mood and affect.     Assessment/Plan Avascular necrosis of right femoral head and degenerative changes of right hip joint  PLAN:  Right Hip Arthroplasty  Reznor Ferrando M 06/06/2012, 4:51 PM

## 2012-06-06 ENCOUNTER — Encounter (HOSPITAL_COMMUNITY)
Admission: RE | Admit: 2012-06-06 | Discharge: 2012-06-06 | Disposition: A | Discharge status: Home / Self Care | Payer: Medicare Other | Source: Ambulatory Visit | Attending: Physician Assistant | Admitting: Physician Assistant

## 2012-06-06 ENCOUNTER — Encounter (HOSPITAL_COMMUNITY): Payer: Self-pay

## 2012-06-06 ENCOUNTER — Encounter (HOSPITAL_COMMUNITY)
Admission: RE | Admit: 2012-06-06 | Discharge: 2012-06-06 | Disposition: A | Discharge status: Home / Self Care | Payer: Medicare Other | Source: Ambulatory Visit | Attending: Specialist | Admitting: Specialist

## 2012-06-06 ENCOUNTER — Other Ambulatory Visit: Payer: Self-pay

## 2012-06-06 DIAGNOSIS — M169 Osteoarthritis of hip, unspecified: Secondary | ICD-10-CM | POA: Diagnosis not present

## 2012-06-06 DIAGNOSIS — M87059 Idiopathic aseptic necrosis of unspecified femur: Secondary | ICD-10-CM | POA: Diagnosis not present

## 2012-06-06 DIAGNOSIS — D62 Acute posthemorrhagic anemia: Secondary | ICD-10-CM | POA: Diagnosis not present

## 2012-06-06 DIAGNOSIS — Z01811 Encounter for preprocedural respiratory examination: Secondary | ICD-10-CM | POA: Diagnosis not present

## 2012-06-06 DIAGNOSIS — N319 Neuromuscular dysfunction of bladder, unspecified: Secondary | ICD-10-CM | POA: Diagnosis not present

## 2012-06-06 HISTORY — DX: Personal history of other diseases of the digestive system: Z87.19

## 2012-06-06 HISTORY — DX: Major depressive disorder, single episode, unspecified: F32.9

## 2012-06-06 HISTORY — DX: Carpal tunnel syndrome, left upper limb: G56.02

## 2012-06-06 HISTORY — DX: Anemia, unspecified: D64.9

## 2012-06-06 HISTORY — DX: Cardiac arrhythmia, unspecified: I49.9

## 2012-06-06 HISTORY — DX: Unspecified osteoarthritis, unspecified site: M19.90

## 2012-06-06 HISTORY — DX: Urinary tract infection, site not specified: N39.0

## 2012-06-06 HISTORY — DX: Dorsalgia, unspecified: M54.9

## 2012-06-06 HISTORY — DX: Cardiac murmur, unspecified: R01.1

## 2012-06-06 HISTORY — DX: Depression, unspecified: F32.A

## 2012-06-06 HISTORY — DX: Fibromyalgia: M79.7

## 2012-06-06 HISTORY — DX: Gastro-esophageal reflux disease without esophagitis: K21.9

## 2012-06-06 HISTORY — DX: Unspecified asthma, uncomplicated: J45.909

## 2012-06-06 HISTORY — DX: Bladder disorder, unspecified: N32.9

## 2012-06-06 HISTORY — DX: Pneumonia, unspecified organism: J18.9

## 2012-06-06 HISTORY — DX: Bronchitis, not specified as acute or chronic: J40

## 2012-06-06 HISTORY — DX: Other chronic pain: G89.29

## 2012-06-06 LAB — URINALYSIS, ROUTINE W REFLEX MICROSCOPIC
Bilirubin Urine: NEGATIVE
Ketones, ur: NEGATIVE mg/dL
Nitrite: NEGATIVE
Specific Gravity, Urine: 1.011 (ref 1.005–1.030)
Urobilinogen, UA: 0.2 mg/dL (ref 0.0–1.0)

## 2012-06-06 LAB — CBC
MCH: 26.5 pg (ref 26.0–34.0)
MCV: 80.6 fL (ref 78.0–100.0)
Platelets: 350 10*3/uL (ref 150–400)
RDW: 17.5 % — ABNORMAL HIGH (ref 11.5–15.5)
WBC: 14.3 10*3/uL — ABNORMAL HIGH (ref 4.0–10.5)

## 2012-06-06 LAB — BASIC METABOLIC PANEL
CO2: 29 mEq/L (ref 19–32)
Calcium: 9.9 mg/dL (ref 8.4–10.5)
Creatinine, Ser: 1.36 mg/dL — ABNORMAL HIGH (ref 0.50–1.10)
GFR calc non Af Amer: 38 mL/min — ABNORMAL LOW (ref >90)

## 2012-06-06 LAB — URINE MICROSCOPIC-ADD ON

## 2012-06-07 NOTE — H&P (Signed)
Patient examined and lab reviewed with Lonzo Candy. Patient examined and lab reviewed with Lonzo Candy.

## 2012-06-08 MED ORDER — CEFAZOLIN SODIUM-DEXTROSE 2-3 GM-% IV SOLR
2.0000 g | INTRAVENOUS | Status: AC
  Administered 2012-06-09: 2 g via INTRAVENOUS
  Filled 2012-06-08: qty 50

## 2012-06-09 ENCOUNTER — Inpatient Hospital Stay (HOSPITAL_COMMUNITY): Payer: Medicare Other

## 2012-06-09 ENCOUNTER — Encounter (HOSPITAL_COMMUNITY): Payer: Self-pay | Admitting: Vascular Surgery

## 2012-06-09 ENCOUNTER — Inpatient Hospital Stay (HOSPITAL_COMMUNITY)
Admission: RE | Admit: 2012-06-09 | Discharge: 2012-06-13 | DRG: 470 | Disposition: A | Discharge status: Home Health | Payer: Medicare Other | Source: Ambulatory Visit | Attending: Specialist | Admitting: Specialist

## 2012-06-09 ENCOUNTER — Ambulatory Visit (HOSPITAL_COMMUNITY): Payer: Medicare Other | Admitting: Vascular Surgery

## 2012-06-09 ENCOUNTER — Encounter (HOSPITAL_COMMUNITY)
Admission: RE | Disposition: A | Discharge status: Home Health | Payer: Self-pay | Source: Ambulatory Visit | Attending: Specialist

## 2012-06-09 DIAGNOSIS — F3289 Other specified depressive episodes: Secondary | ICD-10-CM | POA: Diagnosis present

## 2012-06-09 DIAGNOSIS — M8700 Idiopathic aseptic necrosis of unspecified bone: Secondary | ICD-10-CM

## 2012-06-09 DIAGNOSIS — M069 Rheumatoid arthritis, unspecified: Secondary | ICD-10-CM | POA: Diagnosis present

## 2012-06-09 DIAGNOSIS — M87059 Idiopathic aseptic necrosis of unspecified femur: Secondary | ICD-10-CM | POA: Diagnosis not present

## 2012-06-09 DIAGNOSIS — M1611 Unilateral primary osteoarthritis, right hip: Secondary | ICD-10-CM

## 2012-06-09 DIAGNOSIS — N319 Neuromuscular dysfunction of bladder, unspecified: Secondary | ICD-10-CM | POA: Diagnosis not present

## 2012-06-09 DIAGNOSIS — M169 Osteoarthritis of hip, unspecified: Secondary | ICD-10-CM | POA: Diagnosis present

## 2012-06-09 DIAGNOSIS — Z96649 Presence of unspecified artificial hip joint: Secondary | ICD-10-CM | POA: Diagnosis not present

## 2012-06-09 DIAGNOSIS — R339 Retention of urine, unspecified: Secondary | ICD-10-CM | POA: Diagnosis present

## 2012-06-09 DIAGNOSIS — IMO0001 Reserved for inherently not codable concepts without codable children: Secondary | ICD-10-CM | POA: Diagnosis present

## 2012-06-09 DIAGNOSIS — M1610 Unilateral primary osteoarthritis, unspecified hip: Secondary | ICD-10-CM | POA: Diagnosis present

## 2012-06-09 DIAGNOSIS — F329 Major depressive disorder, single episode, unspecified: Secondary | ICD-10-CM | POA: Diagnosis present

## 2012-06-09 DIAGNOSIS — D649 Anemia, unspecified: Secondary | ICD-10-CM | POA: Diagnosis not present

## 2012-06-09 DIAGNOSIS — E119 Type 2 diabetes mellitus without complications: Secondary | ICD-10-CM | POA: Diagnosis not present

## 2012-06-09 DIAGNOSIS — M25559 Pain in unspecified hip: Secondary | ICD-10-CM | POA: Diagnosis not present

## 2012-06-09 DIAGNOSIS — D62 Acute posthemorrhagic anemia: Secondary | ICD-10-CM | POA: Diagnosis not present

## 2012-06-09 DIAGNOSIS — K219 Gastro-esophageal reflux disease without esophagitis: Secondary | ICD-10-CM | POA: Diagnosis present

## 2012-06-09 HISTORY — DX: Unilateral primary osteoarthritis, right hip: M16.11

## 2012-06-09 HISTORY — PX: TOTAL HIP ARTHROPLASTY: SHX124

## 2012-06-09 HISTORY — DX: Essential (primary) hypertension: I10

## 2012-06-09 HISTORY — DX: Sleep apnea, unspecified: G47.30

## 2012-06-09 HISTORY — DX: Idiopathic aseptic necrosis of unspecified bone: M87.00

## 2012-06-09 LAB — GLUCOSE, CAPILLARY: Glucose-Capillary: 150 mg/dL — ABNORMAL HIGH (ref 70–99)

## 2012-06-09 LAB — URINALYSIS, ROUTINE W REFLEX MICROSCOPIC
Bilirubin Urine: NEGATIVE
Ketones, ur: NEGATIVE mg/dL
Nitrite: NEGATIVE
Urobilinogen, UA: 0.2 mg/dL (ref 0.0–1.0)
pH: 6 (ref 5.0–8.0)

## 2012-06-09 LAB — URINE MICROSCOPIC-ADD ON

## 2012-06-09 SURGERY — ARTHROPLASTY, HIP, TOTAL,POSTERIOR APPROACH
Anesthesia: General | Site: Hip | Laterality: Right | Wound class: Clean

## 2012-06-09 MED ORDER — ONDANSETRON HCL 4 MG/2ML IJ SOLN: 4.0000 mg | Freq: Once | INTRAMUSCULAR | Status: DC | PRN

## 2012-06-09 MED ORDER — PHENOL 1.4 % MT LIQD: 1.0000 | OROMUCOSAL | Status: DC | PRN

## 2012-06-09 MED ORDER — HYDROMORPHONE HCL PF 1 MG/ML IJ SOLN
0.2500 mg | INTRAMUSCULAR | Status: DC | PRN
  Administered 2012-06-09 (×2): 0.5 mg via INTRAVENOUS

## 2012-06-09 MED ORDER — METHOCARBAMOL 100 MG/ML IJ SOLN
500.0000 mg | Freq: Four times a day (QID) | INTRAVENOUS | Status: DC | PRN
  Filled 2012-06-09: qty 5

## 2012-06-09 MED ORDER — PREGABALIN 50 MG PO CAPS
100.0000 mg | ORAL_CAPSULE | Freq: Every day | ORAL | Status: DC
  Administered 2012-06-10 – 2012-06-12 (×3): 100 mg via ORAL
  Filled 2012-06-09: qty 1
  Filled 2012-06-09 (×2): qty 2

## 2012-06-09 MED ORDER — FLEET ENEMA 7-19 GM/118ML RE ENEM: 1.0000 | ENEMA | Freq: Once | RECTAL | Status: AC | PRN

## 2012-06-09 MED ORDER — KCL IN DEXTROSE-NACL 20-5-0.9 MEQ/L-%-% IV SOLN
INTRAVENOUS | Status: DC
  Administered 2012-06-09: 15:00:00 via INTRAVENOUS
  Filled 2012-06-09 (×3): qty 1000

## 2012-06-09 MED ORDER — METOCLOPRAMIDE HCL 10 MG PO TABS: 5.0000 mg | ORAL_TABLET | Freq: Three times a day (TID) | ORAL | Status: DC | PRN

## 2012-06-09 MED ORDER — DEXTROSE 5 % IV SOLN
INTRAVENOUS | Status: DC | PRN
  Administered 2012-06-09 (×2): via INTRAVENOUS

## 2012-06-09 MED ORDER — CEFAZOLIN SODIUM 1-5 GM-% IV SOLN
1.0000 g | Freq: Four times a day (QID) | INTRAVENOUS | Status: AC
  Administered 2012-06-09 (×2): 1 g via INTRAVENOUS
  Filled 2012-06-09 (×2): qty 50

## 2012-06-09 MED ORDER — CHLORHEXIDINE GLUCONATE 4 % EX LIQD: 60.0000 mL | Freq: Once | CUTANEOUS | Status: DC

## 2012-06-09 MED ORDER — DILTIAZEM HCL ER COATED BEADS 360 MG PO CP24
360.0000 mg | ORAL_CAPSULE | Freq: Every day | ORAL | Status: DC
  Administered 2012-06-10 – 2012-06-13 (×3): 360 mg via ORAL
  Filled 2012-06-09 (×4): qty 1

## 2012-06-09 MED ORDER — HYDROCODONE-ACETAMINOPHEN 7.5-325 MG PO TABS
1.0000 | ORAL_TABLET | ORAL | Status: DC | PRN
  Administered 2012-06-10 (×2): 2 via ORAL
  Administered 2012-06-10: 1 via ORAL
  Administered 2012-06-11 – 2012-06-13 (×4): 2 via ORAL
  Filled 2012-06-09 (×3): qty 2
  Filled 2012-06-09: qty 1
  Filled 2012-06-09 (×4): qty 2

## 2012-06-09 MED ORDER — PROPOFOL 10 MG/ML IV EMUL
INTRAVENOUS | Status: DC | PRN
  Administered 2012-06-09: 120 mg via INTRAVENOUS

## 2012-06-09 MED ORDER — CLONIDINE HCL 0.1 MG PO TABS
0.1000 mg | ORAL_TABLET | Freq: Two times a day (BID) | ORAL | Status: DC
  Administered 2012-06-09 – 2012-06-13 (×7): 0.1 mg via ORAL
  Filled 2012-06-09 (×9): qty 1

## 2012-06-09 MED ORDER — PREGABALIN 50 MG PO CAPS: 50.0000 mg | ORAL_CAPSULE | Freq: Two times a day (BID) | ORAL | Status: DC

## 2012-06-09 MED ORDER — KCL IN DEXTROSE-NACL 20-5-0.9 MEQ/L-%-% IV SOLN
INTRAVENOUS | Status: AC
  Administered 2012-06-10 (×2): via INTRAVENOUS
  Filled 2012-06-09 (×3): qty 1000

## 2012-06-09 MED ORDER — INSULIN ASPART 100 UNIT/ML ~~LOC~~ SOLN
0.0000 [IU] | Freq: Three times a day (TID) | SUBCUTANEOUS | Status: DC
  Administered 2012-06-10: 2 [IU] via SUBCUTANEOUS
  Administered 2012-06-10: 3 [IU] via SUBCUTANEOUS
  Administered 2012-06-12 – 2012-06-13 (×2): 2 [IU] via SUBCUTANEOUS

## 2012-06-09 MED ORDER — LIDOCAINE HCL (CARDIAC) 20 MG/ML IV SOLN
INTRAVENOUS | Status: DC | PRN
  Administered 2012-06-09: 80 mg via INTRAVENOUS

## 2012-06-09 MED ORDER — ATORVASTATIN CALCIUM 40 MG PO TABS
40.0000 mg | ORAL_TABLET | Freq: Every day | ORAL | Status: DC
  Administered 2012-06-10 – 2012-06-12 (×3): 40 mg via ORAL
  Filled 2012-06-09 (×5): qty 1

## 2012-06-09 MED ORDER — DEXAMETHASONE SODIUM PHOSPHATE 4 MG/ML IJ SOLN
INTRAMUSCULAR | Status: DC | PRN
  Administered 2012-06-09: 4 mg via INTRAVENOUS

## 2012-06-09 MED ORDER — ONDANSETRON HCL 4 MG/2ML IJ SOLN
INTRAMUSCULAR | Status: DC | PRN
  Administered 2012-06-09: 4 mg via INTRAVENOUS

## 2012-06-09 MED ORDER — PREGABALIN 50 MG PO CAPS
50.0000 mg | ORAL_CAPSULE | Freq: Every day | ORAL | Status: DC
  Administered 2012-06-10 – 2012-06-13 (×4): 50 mg via ORAL
  Filled 2012-06-09 (×5): qty 1

## 2012-06-09 MED ORDER — HYDROMORPHONE HCL PF 1 MG/ML IJ SOLN
INTRAMUSCULAR | Status: AC
  Filled 2012-06-09: qty 1

## 2012-06-09 MED ORDER — FUROSEMIDE 80 MG PO TABS
80.0000 mg | ORAL_TABLET | Freq: Every day | ORAL | Status: DC
  Administered 2012-06-09 – 2012-06-13 (×5): 80 mg via ORAL
  Filled 2012-06-09 (×5): qty 1

## 2012-06-09 MED ORDER — ONDANSETRON HCL 4 MG PO TABS: 4.0000 mg | ORAL_TABLET | Freq: Four times a day (QID) | ORAL | Status: DC | PRN

## 2012-06-09 MED ORDER — POTASSIUM CHLORIDE CRYS ER 20 MEQ PO TBCR
20.0000 meq | EXTENDED_RELEASE_TABLET | Freq: Every day | ORAL | Status: DC
  Administered 2012-06-09 – 2012-06-13 (×5): 20 meq via ORAL
  Filled 2012-06-09 (×5): qty 1

## 2012-06-09 MED ORDER — DABIGATRAN ETEXILATE MESYLATE 150 MG PO CAPS
150.0000 mg | ORAL_CAPSULE | ORAL | Status: DC
  Administered 2012-06-09 – 2012-06-13 (×3): 150 mg via ORAL
  Filled 2012-06-09 (×3): qty 1

## 2012-06-09 MED ORDER — FENTANYL CITRATE 0.05 MG/ML IJ SOLN
INTRAMUSCULAR | Status: DC | PRN
  Administered 2012-06-09: 100 ug via INTRAVENOUS
  Administered 2012-06-09: 50 ug via INTRAVENOUS
  Administered 2012-06-09: 100 ug via INTRAVENOUS

## 2012-06-09 MED ORDER — PANTOPRAZOLE SODIUM 40 MG PO TBEC
80.0000 mg | DELAYED_RELEASE_TABLET | Freq: Every day | ORAL | Status: DC
  Administered 2012-06-10 – 2012-06-13 (×4): 80 mg via ORAL
  Filled 2012-06-09 (×4): qty 2

## 2012-06-09 MED ORDER — METHOCARBAMOL 500 MG PO TABS
500.0000 mg | ORAL_TABLET | Freq: Four times a day (QID) | ORAL | Status: DC | PRN
  Administered 2012-06-10 – 2012-06-13 (×6): 500 mg via ORAL
  Filled 2012-06-09 (×6): qty 1

## 2012-06-09 MED ORDER — NALOXONE HCL 0.4 MG/ML IJ SOLN
0.1000 mg | Freq: Once | INTRAMUSCULAR | Status: AC
  Administered 2012-06-09 (×2): 0.2 mg via INTRAVENOUS

## 2012-06-09 MED ORDER — TRAMADOL HCL 50 MG PO TABS
50.0000 mg | ORAL_TABLET | Freq: Four times a day (QID) | ORAL | Status: DC | PRN
  Administered 2012-06-12 – 2012-06-13 (×4): 50 mg via ORAL
  Filled 2012-06-09 (×4): qty 1

## 2012-06-09 MED ORDER — LINAGLIPTIN 5 MG PO TABS
5.0000 mg | ORAL_TABLET | Freq: Every day | ORAL | Status: DC
  Administered 2012-06-09 – 2012-06-13 (×5): 5 mg via ORAL
  Filled 2012-06-09 (×5): qty 1

## 2012-06-09 MED ORDER — SORBITOL 70 % SOLN
30.0000 mL | Freq: Every day | Status: DC | PRN
  Administered 2012-06-12: 30 mL via ORAL
  Filled 2012-06-09: qty 30

## 2012-06-09 MED ORDER — FERROUS SULFATE 325 (65 FE) MG PO TABS
325.0000 mg | ORAL_TABLET | Freq: Two times a day (BID) | ORAL | Status: DC
  Administered 2012-06-10 – 2012-06-13 (×7): 325 mg via ORAL
  Filled 2012-06-09 (×10): qty 1

## 2012-06-09 MED ORDER — ONDANSETRON HCL 4 MG/2ML IJ SOLN: 4.0000 mg | Freq: Four times a day (QID) | INTRAMUSCULAR | Status: DC | PRN

## 2012-06-09 MED ORDER — DARIFENACIN HYDROBROMIDE ER 15 MG PO TB24
15.0000 mg | ORAL_TABLET | Freq: Every day | ORAL | Status: DC
  Administered 2012-06-10 – 2012-06-13 (×4): 15 mg via ORAL
  Filled 2012-06-09 (×5): qty 1

## 2012-06-09 MED ORDER — SODIUM CHLORIDE 0.9 % IR SOLN
Status: DC | PRN
  Administered 2012-06-09: 1000 mL

## 2012-06-09 MED ORDER — MENTHOL 3 MG MT LOZG: 1.0000 | LOZENGE | OROMUCOSAL | Status: DC | PRN

## 2012-06-09 MED ORDER — DOCUSATE SODIUM 100 MG PO CAPS
100.0000 mg | ORAL_CAPSULE | Freq: Two times a day (BID) | ORAL | Status: DC
  Administered 2012-06-09 – 2012-06-13 (×8): 100 mg via ORAL
  Filled 2012-06-09 (×9): qty 1

## 2012-06-09 MED ORDER — POLYVINYL ALCOHOL 1.4 % OP SOLN: 1.0000 [drp] | Freq: Two times a day (BID) | OPHTHALMIC | Status: DC | PRN

## 2012-06-09 MED ORDER — PRAMIPEXOLE DIHYDROCHLORIDE 0.25 MG PO TABS
0.5000 mg | ORAL_TABLET | Freq: Every day | ORAL | Status: DC
  Administered 2012-06-09 – 2012-06-12 (×4): 0.5 mg via ORAL
  Filled 2012-06-09 (×5): qty 2

## 2012-06-09 MED ORDER — FOLIC ACID 1 MG PO TABS
1.0000 mg | ORAL_TABLET | Freq: Every day | ORAL | Status: DC
  Administered 2012-06-10 – 2012-06-13 (×4): 1 mg via ORAL
  Filled 2012-06-09 (×5): qty 1

## 2012-06-09 MED ORDER — ACETAMINOPHEN 10 MG/ML IV SOLN
INTRAVENOUS | Status: DC | PRN
  Administered 2012-06-09: 1000 mg via INTRAVENOUS

## 2012-06-09 MED ORDER — DIPHENHYDRAMINE HCL 12.5 MG/5ML PO ELIX: 12.5000 mg | ORAL_SOLUTION | ORAL | Status: DC | PRN

## 2012-06-09 MED ORDER — NEOSTIGMINE METHYLSULFATE 1 MG/ML IJ SOLN
INTRAMUSCULAR | Status: DC | PRN
  Administered 2012-06-09: 3 mg via INTRAVENOUS

## 2012-06-09 MED ORDER — GLYCOPYRROLATE 0.2 MG/ML IJ SOLN
INTRAMUSCULAR | Status: DC | PRN
  Administered 2012-06-09: .4 mg via INTRAVENOUS
  Administered 2012-06-09: 0.2 mg via INTRAVENOUS

## 2012-06-09 MED ORDER — ARIPIPRAZOLE 5 MG PO TABS
5.0000 mg | ORAL_TABLET | Freq: Every day | ORAL | Status: DC
  Administered 2012-06-10 – 2012-06-13 (×4): 5 mg via ORAL
  Filled 2012-06-09 (×4): qty 1

## 2012-06-09 MED ORDER — ESCITALOPRAM OXALATE 10 MG PO TABS
10.0000 mg | ORAL_TABLET | Freq: Every day | ORAL | Status: DC
  Administered 2012-06-10 – 2012-06-13 (×4): 10 mg via ORAL
  Filled 2012-06-09 (×4): qty 1

## 2012-06-09 MED ORDER — POLYETHYLENE GLYCOL 3350 17 G PO PACK: 17.0000 g | PACK | Freq: Every day | ORAL | Status: DC | PRN

## 2012-06-09 MED ORDER — ACETAMINOPHEN 325 MG PO TABS: 650.0000 mg | ORAL_TABLET | Freq: Four times a day (QID) | ORAL | Status: DC | PRN

## 2012-06-09 MED ORDER — ROCURONIUM BROMIDE 100 MG/10ML IV SOLN
INTRAVENOUS | Status: DC | PRN
  Administered 2012-06-09: 40 mg via INTRAVENOUS

## 2012-06-09 MED ORDER — NALOXONE HCL 0.4 MG/ML IJ SOLN
INTRAMUSCULAR | Status: AC
  Administered 2012-06-09: 0.2 mg via INTRAVENOUS
  Filled 2012-06-09: qty 1

## 2012-06-09 MED ORDER — LACTATED RINGERS IV SOLN
INTRAVENOUS | Status: DC | PRN
  Administered 2012-06-09 (×2): via INTRAVENOUS

## 2012-06-09 MED ORDER — ACETAMINOPHEN 650 MG RE SUPP: 650.0000 mg | Freq: Four times a day (QID) | RECTAL | Status: DC | PRN

## 2012-06-09 MED ORDER — ACETAMINOPHEN 10 MG/ML IV SOLN
INTRAVENOUS | Status: AC
  Filled 2012-06-09: qty 100

## 2012-06-09 MED ORDER — EPHEDRINE SULFATE 50 MG/ML IJ SOLN
INTRAMUSCULAR | Status: DC | PRN
  Administered 2012-06-09: 10 mg via INTRAVENOUS

## 2012-06-09 MED ORDER — HYDROMORPHONE HCL PF 1 MG/ML IJ SOLN: 0.5000 mg | INTRAMUSCULAR | Status: DC | PRN

## 2012-06-09 MED ORDER — METHOCARBAMOL 100 MG/ML IJ SOLN
500.0000 mg | INTRAVENOUS | Status: AC
  Administered 2012-06-09: 500 mg via INTRAVENOUS
  Filled 2012-06-09: qty 5

## 2012-06-09 SURGICAL SUPPLY — 69 items
ADH SKN CLS APL DERMABOND .7 (GAUZE/BANDAGES/DRESSINGS) ×2
APL SKNCLS STERI-STRIP NONHPOA (GAUZE/BANDAGES/DRESSINGS)
BENZOIN TINCTURE PRP APPL 2/3 (GAUZE/BANDAGES/DRESSINGS) ×1 IMPLANT
BLADE SAW SAG 73X25 THK (BLADE) ×1
BLADE SAW SGTL 73X25 THK (BLADE) ×1 IMPLANT
BRUSH FEMORAL CANAL (MISCELLANEOUS) IMPLANT
CLOTH BEACON ORANGE TIMEOUT ST (SAFETY) ×2 IMPLANT
COVER BACK TABLE 24X17X13 BIG (DRAPES) IMPLANT
COVER SURGICAL LIGHT HANDLE (MISCELLANEOUS) ×2 IMPLANT
CUP ACET PINNACLE SECTR 50MM (Hips) IMPLANT
DERMABOND ADVANCED (GAUZE/BANDAGES/DRESSINGS) ×2
DERMABOND ADVANCED .7 DNX12 (GAUZE/BANDAGES/DRESSINGS) ×1 IMPLANT
DRAPE INCISE IOBAN 66X45 STRL (DRAPES) ×2 IMPLANT
DRAPE ORTHO SPLIT 77X108 STRL (DRAPES) ×6
DRAPE SURG ORHT 6 SPLT 77X108 (DRAPES) ×2 IMPLANT
DRAPE U-SHAPE 47X51 STRL (DRAPES) ×2 IMPLANT
DRILL BIT 7/64X5 (BIT) ×2 IMPLANT
DRSG MEPILEX BORDER 4X12 (GAUZE/BANDAGES/DRESSINGS) ×1 IMPLANT
DRSG MEPILEX BORDER 4X4 (GAUZE/BANDAGES/DRESSINGS) ×1 IMPLANT
DRSG MEPILEX BORDER 4X8 (GAUZE/BANDAGES/DRESSINGS) ×1 IMPLANT
DURAPREP 26ML APPLICATOR (WOUND CARE) ×2 IMPLANT
ELECT BLADE 6.5 EXT (BLADE) IMPLANT
ELECT REM PT RETURN 9FT ADLT (ELECTROSURGICAL) ×2
ELECTRODE REM PT RTRN 9FT ADLT (ELECTROSURGICAL) ×1 IMPLANT
EVACUATOR 1/8 PVC DRAIN (DRAIN) ×2 IMPLANT
FACESHIELD LNG OPTICON STERILE (SAFETY) ×4 IMPLANT
FILTER STRAW FLUID ASPIR (MISCELLANEOUS) ×1 IMPLANT
GLOVE BIOGEL PI IND STRL 7.0 (GLOVE) IMPLANT
GLOVE BIOGEL PI IND STRL 7.5 (GLOVE) ×1 IMPLANT
GLOVE BIOGEL PI INDICATOR 7.0 (GLOVE) ×1
GLOVE BIOGEL PI INDICATOR 7.5 (GLOVE) ×1
GLOVE ECLIPSE 7.0 STRL STRAW (GLOVE) ×2 IMPLANT
GLOVE ECLIPSE 8.5 STRL (GLOVE) ×2 IMPLANT
GLOVE SURG 8.5 LATEX PF (GLOVE) ×2 IMPLANT
GLOVE SURG SS PI 6.5 STRL IVOR (GLOVE) ×2 IMPLANT
GOWN PREVENTION PLUS LG XLONG (DISPOSABLE) ×2 IMPLANT
GOWN PREVENTION PLUS XXLARGE (GOWN DISPOSABLE) ×3 IMPLANT
GOWN STRL NON-REIN LRG LVL3 (GOWN DISPOSABLE) ×4 IMPLANT
HANDPIECE INTERPULSE COAX TIP (DISPOSABLE)
IMMOBILIZER KNEE 20 (SOFTGOODS) ×2
IMMOBILIZER KNEE 20 THIGH 36 (SOFTGOODS) ×1 IMPLANT
KIT BASIN OR (CUSTOM PROCEDURE TRAY) ×2 IMPLANT
KIT ROOM TURNOVER OR (KITS) ×2 IMPLANT
MANIFOLD NEPTUNE II (INSTRUMENTS) ×2 IMPLANT
NEEDLE 22X1 1/2 (OR ONLY) (NEEDLE) ×2 IMPLANT
NS IRRIG 1000ML POUR BTL (IV SOLUTION) ×2 IMPLANT
PACK TOTAL JOINT (CUSTOM PROCEDURE TRAY) ×2 IMPLANT
PAD ARMBOARD 7.5X6 YLW CONV (MISCELLANEOUS) ×4 IMPLANT
PASSER SUT SWANSON 36MM LOOP (INSTRUMENTS) ×2 IMPLANT
PINNACLE SECTOR CUP 50MM (Hips) IMPLANT
PRESSURIZER FEMORAL UNIV (MISCELLANEOUS) IMPLANT
SET HNDPC FAN SPRY TIP SCT (DISPOSABLE) IMPLANT
SPONGE LAP 4X18 X RAY DECT (DISPOSABLE) ×2 IMPLANT
STRIP CLOSURE SKIN 1/2X4 (GAUZE/BANDAGES/DRESSINGS) ×2 IMPLANT
SUCTION FRAZIER TIP 10 FR DISP (SUCTIONS) ×2 IMPLANT
SUT ETHIBOND NAB CT1 #1 30IN (SUTURE) ×9 IMPLANT
SUT VIC AB 0 CT1 27 (SUTURE) ×2
SUT VIC AB 0 CT1 27XBRD ANBCTR (SUTURE) ×2 IMPLANT
SUT VIC AB 1 CT1 27 (SUTURE) ×2
SUT VIC AB 1 CT1 27XBRD ANBCTR (SUTURE) ×2 IMPLANT
SUT VIC AB 2-0 CT1 27 (SUTURE) ×4
SUT VIC AB 2-0 CT1 TAPERPNT 27 (SUTURE) ×2 IMPLANT
SUT VICRYL 4-0 PS2 18IN ABS (SUTURE) ×2 IMPLANT
SYR CONTROL 10ML LL (SYRINGE) ×2 IMPLANT
TOWEL OR 17X24 6PK STRL BLUE (TOWEL DISPOSABLE) ×2 IMPLANT
TOWEL OR 17X26 10 PK STRL BLUE (TOWEL DISPOSABLE) ×2 IMPLANT
TOWER CARTRIDGE SMART MIX (DISPOSABLE) IMPLANT
TRAY FOLEY CATH 14FR (SET/KITS/TRAYS/PACK) ×2 IMPLANT
WATER STERILE IRR 1000ML POUR (IV SOLUTION) ×5 IMPLANT

## 2012-06-09 NOTE — Anesthesia Procedure Notes (Signed)
Procedure Name: Intubation Date/Time: 06/09/2012 7:49 AM Performed by: Wanita Chamberlain Pre-anesthesia Checklist: Patient identified, Emergency Drugs available, Suction available, Patient being monitored and Timeout performed Patient Re-evaluated:Patient Re-evaluated prior to inductionOxygen Delivery Method: Circle system utilized Preoxygenation: Pre-oxygenation with 100% oxygen Intubation Type: IV induction Ventilation: Mask ventilation without difficulty and Oral airway inserted - appropriate to patient size Laryngoscope Size: Mac and 3 Grade View: Grade I Tube type: Oral Tube size: 7.5 mm Number of attempts: 1 Airway Equipment and Method: Stylet Placement Confirmation: CO2 detector,  breath sounds checked- equal and bilateral,  positive ETCO2 and ETT inserted through vocal cords under direct vision Secured at: 21 cm Tube secured with: Tape Dental Injury: Teeth and Oropharynx as per pre-operative assessment

## 2012-06-09 NOTE — Plan of Care (Signed)
Problem: Consults Goal: Diagnosis- Total Joint Replacement Outcome: Completed/Met Date Met:  06/09/12 Primary Total Hip  Problem: Phase I Progression Outcomes Goal: Initial discharge plan identified Outcome: Completed/Met Date Met:  06/09/12 Patient and family report plan is to go home with daughters' assistance 24hr/day

## 2012-06-09 NOTE — Anesthesia Preprocedure Evaluation (Addendum)
Anesthesia Evaluation  Patient identified by MRN, date of birth, ID band Patient awake, Patient confused and Patient unresponsive    Reviewed: Allergy & Precautions, H&P , NPO status , Patient's Chart, lab work & pertinent test results  History of Anesthesia Complications (+) DIFFICULT IV STICK / SPECIAL LINE  Airway Mallampati: I TM Distance: >3 FB Neck ROM: full    Dental  (+) Edentulous Upper, Poor Dentition and Dental Advisory Given   Pulmonary asthma , neg pneumonia -,      rales    Cardiovascular Exercise Tolerance: Poor hypertension, Pt. on medications + dysrhythmias + Valvular Problems/Murmurs Rhythm:regular Rate:Normal     Neuro/Psych PSYCHIATRIC DISORDERS Anxiety Depression  Neuromuscular disease    GI/Hepatic hiatal hernia, GERD-  Medicated and Controlled,  Endo/Other  Well Controlled, Type 2, Oral Hypoglycemic Agents  Renal/GU UTI being treated     Musculoskeletal  (+) Arthritis -, Osteoarthritis,  Fibromyalgia -  Abdominal   Peds  Hematology negative hematology ROS (+)   Anesthesia Other Findings Lower dentition in poor repair  Reproductive/Obstetrics                        Anesthesia Physical Anesthesia Plan  ASA: III  Anesthesia Plan: General   Post-op Pain Management:    Induction: Intravenous  Airway Management Planned: Oral ETT  Additional Equipment:   Intra-op Plan:   Post-operative Plan: Extubation in OR  Informed Consent: I have reviewed the patients History and Physical, chart, labs and discussed the procedure including the risks, benefits and alternatives for the proposed anesthesia with the patient or authorized representative who has indicated his/her understanding and acceptance.   Dental advisory given  Plan Discussed with: Anesthesiologist, CRNA and Surgeon  Anesthesia Plan Comments:        Anesthesia Quick Evaluation

## 2012-06-09 NOTE — Progress Notes (Signed)
UR COMPLETED  

## 2012-06-09 NOTE — Progress Notes (Signed)
Pt was cathed (sterile in and out) by her daughter for the urine specimen.  She is normally cathed four times a day at home by her daughter.

## 2012-06-09 NOTE — Brief Op Note (Signed)
06/09/2012  9:22 AM  PATIENT:  Gloria Best  71 y.o. 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  SURGEON:  Surgeon(s) and Role: [name], MD - Primary  PHYSICIAN ASSISTANT: Phillips Hay PA-C  ANESTHESIA:   general Dr. Sherren Kerns  EBL:  Total I/O In: 41 [I.V.:1150] Out: 400 [Urine:100; Blood:300]  BLOOD ADMINISTERED:none  DRAINS: (1) Hemovact drain(s) in the Right proximal anterior lateral thigh into the hip with  Suction Open   LOCAL MEDICATIONS USED:  NONE  SPECIMEN: Femoral head  DISPOSITION OF SPECIMEN:  PATHOLOGY  COUNTS:  YES  TOURNIQUET:  * No tourniquets in log *  DICTATION: .Dragon Dictation  PLAN OF CARE: Admit to inpatient   PATIENT DISPOSITION:  PACU - hemodynamically stable.   Delay start of Pharmacological VTE agent (>24hrs) due to surgical blood loss or risk of bleeding: no

## 2012-06-09 NOTE — Op Note (Signed)
06/09/2012  9:27 AM  OPERATIVE REPORT   PATIENT:  Gloria Best  71 y.o. 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     SURGEON: [name], MD    ASSISTANT: Phillips Hay, PA-C  (Present throughout the entire procedure and necessary for completion of procedure in a timely manner)     ANESTHESIA:  General , Dr. Sherren Kerns   COMPLICATIONS:  None.     COMPONENTS:  DePuy Summit press fit #4 standard femoral component, a 36 mm outer diameter hip ceramic head with a + 5 mm neck length, a 37mm outer  diameter Pinnacle sector or acetabulum with a single fixation screw, and a polyethylene liner +4, 10-degree posterior lip.  Components were Press-Fit.    PROCEDURE IN DETAIL: The patient was met in the holding area and  identified. The appropriate right hip was identified and marked at the operative site. Preoperative antibiotics 2 g Ancef were given . The patient was then transported to the OR and  placed under general l anesthesia. At that point, the patient was  placed in the lateral decubitus position with the operative side up and  secured to the operating room table with the Innomed hip system.  The operative lower extremity was prepped from the iliac crest to the distal  leg with Betadine scrub and then DuraPrep. Sterile draping was  performed.  A routine southern incision was utilized and via sharp dissection  carried down to the subcutaneous tissue. Gross bleeders were Bovie  coagulated. The iliotibial band was quickly identified and incised  along the length of  the skin incision. Self-retaining retractors were  inserted. With the hip internally rotated, the short external rotators  were identified. Tendinous structures were tagged with 0 Ethibond  suture. The hip capsule was identified and incised along the femoral  neck and head. There was a moderate clear yellow joint effusion. Hip was  dislocated posteriorly. It was misshapen and subchondral fracture with cartilage  flap. The femoral neck was then osteotomized using a  calcar cutting guide and removed from the wound. Labral resection was performed  from the acetabulum.  The femoral neck osteotomy was placed about 5 mm proximal to the lesser trochanter.  A starter hole was then made through the piriformis fossa. Canal finder was utilized then lateralizing reamer. Reaming was  performed to the appropriate size #4. I had nice endosteal purchase. Rasping was performed sequentially to the appropriate #4 uncemented rasp.  Retractors were then placed about the acetabulum. Further capsule section  was performed. There was a large degenerative labrum that was also  excised. Retractor was placed about the acetabulum. It was somewhat  misshapen because of the superior migration of the femoral head.  Reaming was performed sequentially to the 23mm size.  It had very nice bleeding circumferentially and a nice strong thick  acetabulum. I then trialed the 66mm acetabular component. It had  complete seating. Note that initially reaming to 49 and trial of 50 and permanent acetabular shell 50 mm did not provide adequate fixation. Reaming was then carried up to 51 mm for a 52 mm outside  diameter acetabular shell . Accordingly, the trial 52 mm acetabular component was  impacted into the acetabulum. It was a very nice fit. It was nice and  stable.  The trial 10 polyethylene liner was inserted followed by the femoral rasp. We trialed a number of neck lengths and felt like trial +5 mm neck and 36 mm ball was the most  stable. At that point, there was minimal toggling and complete stability. Leg lengths were appropriate.  The trial components were then removed. The joint was copiously  irrigated with saline solution. A permanent Pinnacle porous-coated 52 mm acetabular implant was then impacted into place observed to be fully seated in approximately 30 of abduction and 20 anteversion. A single fixation screw was placed into the superior quadrant of the acetabular shell obtaining excellent fixation. A 25 mm x 6.5 mm screw was used. Apex hole eliminator was inserted into  the acetabular component followed by the final Marathon 10 polyethylene liner.  The femoral component was then impacted onto the calcar. Wound was again irrigated.  The trial head was then removed. We cleaned the [facility] taper neck and  inserted the final 36 mm ceramic head with +5 neck. This was reduced, and through a full range of motion, it was perfectly stable and there was no subluxation. There was no evidence of  instability. It had a very nice construct.  Wound was then irrigated with saline solution. The capsule was closed  anatomically with #1 Ethibond. The short external rotators were closed  with similar material through drill hole to the greater trochanter using a suture passer to pass the suture. The wound was again irrigated with saline  solution. The iliotibial band was closed with reluctant #1 Ethibond, subcu  was closed with #1 Vicryl and 0 Vicryl, subcutaneous layers were reapproximated with interrupted 0 and 2-0 Vicryl sutures.Skin was closed with skin  clips. All instruments and sponge counts were correct Mepilex dressing was applied and held in place with Hypafix tape. Knee immobilizer was placed. The patient was then placed in the supine position,  awoken, placed on the operating stretcher, and returned to the  postanesthesia recovery room in satisfactory condition. Phillips Hay PA-C was present for the entire case assisted in  positioning the patient had removal from the table assisted in careful retraction of structures throughout the case and relocation and dislocation of the prosthesis during trialing. Her assistance allowed for case to proceed with the extremely inefficient manner decreasing risk for complication.   Ivianna Notch E  06/09/2012 9:27 AM

## 2012-06-09 NOTE — Anesthesia Postprocedure Evaluation (Signed)
  Anesthesia Post-op Note  Patient: Gloria Best  Procedure(s) Performed: Procedure(s) (LRB): TOTAL HIP ARTHROPLASTY (Right)  Patient Location: PACU  Anesthesia Type: General  Level of Consciousness: awake, oriented, sedated and patient cooperative  Airway and Oxygen Therapy: Patient Spontanous Breathing and Patient connected to nasal cannula oxygen  Post-op Pain: mild  Post-op Assessment: Post-op Vital signs reviewed, Patient's Cardiovascular Status Stable, Respiratory Function Stable, Patent Airway, No signs of Nausea or vomiting and Pain level controlled  Post-op Vital Signs: stable  Complications: No apparent anesthesia complications

## 2012-06-09 NOTE — Progress Notes (Signed)
Earlier (around 1515) patient with depressed respiratory rate of 10, lethargic (arouses for  very short time, follows commands, then quickly dozes back to sleep).  O2 sat 97% on 2L.  Pt last had dilaudid and robaxin in PACU around 11am.    PA-C notified and order for narcan given.  Narcan given as ordered at 1551 and 1629.  2nd dose given as patient's respirations 5/min.  Pt aroused and responds appropriately, performs incentive spirometry to 1500 and CDB.    Respirations now at 12/min at sleep.  Pt arouses easily.  Denies hip pain.  Moves all extremities.  Will continue to monitor closely.  Continuous pulse ox in place.

## 2012-06-09 NOTE — H&P (Addendum)
Patient was seen and examined in the preop holding area. There has been no interval  Change in this patient's exam preop  history and physical exam  Lab tests and images have been examined and reviewed.  The Risks benefits and alternative treatments have been discussed  extensively,questions answered.  The patient has elected to undergo the discussed surgical treatment.  Urinalysis results from today reviewed with patient she has been taking augmentin for greater than 48 hours, UA is improving. Expect she has Adequate coverage. She has a chronic urinary retention state and is undergoing intermittant self catheterization, urology notes that she will always have bacturia. Will proceed with total hip replacement and patient is aware of slight increase Risk of infection.

## 2012-06-09 NOTE — Transfer of Care (Signed)
Immediate Anesthesia Transfer of Care Note  Patient: Gloria Best  Procedure(s) Performed: Procedure(s) (LRB): TOTAL HIP ARTHROPLASTY (Right)  Patient Location: PACU  Anesthesia Type: General  Level of Consciousness: awake, alert , oriented and patient cooperative  Airway & Oxygen Therapy: Patient Spontanous Breathing and Patient connected to nasal cannula oxygen  Post-op Assessment: Report given to PACU RN and Post -op Vital signs reviewed and stable  Post vital signs: Reviewed and stable  Complications: No apparent anesthesia complications

## 2012-06-09 NOTE — Preoperative (Signed)
Beta Blockers   Reason not to administer Beta Blockers:Not Applicable 

## 2012-06-10 ENCOUNTER — Encounter (HOSPITAL_COMMUNITY): Payer: Self-pay | Admitting: General Practice

## 2012-06-10 LAB — GLUCOSE, CAPILLARY
Glucose-Capillary: 142 mg/dL — ABNORMAL HIGH (ref 70–99)
Glucose-Capillary: 118 mg/dL — ABNORMAL HIGH (ref 70–99)
Glucose-Capillary: 162 mg/dL — ABNORMAL HIGH (ref 70–99)
Glucose-Capillary: 126 mg/dL — ABNORMAL HIGH (ref 70–99)

## 2012-06-10 LAB — BASIC METABOLIC PANEL
GFR calc Af Amer: 58 mL/min — ABNORMAL LOW (ref >90)
GFR calc non Af Amer: 50 mL/min — ABNORMAL LOW (ref >90)
Potassium: 3.8 mEq/L (ref 3.5–5.1)
Sodium: 138 mEq/L (ref 135–145)

## 2012-06-10 LAB — CBC
MCHC: 32 g/dL (ref 30.0–36.0)
Platelets: 280 10*3/uL (ref 150–400)
RDW: 17.6 % — ABNORMAL HIGH (ref 11.5–15.5)

## 2012-06-10 NOTE — Progress Notes (Signed)
Patient examined and lab reviewed with Lonzo Candy. Patient examined and lab reviewed with Lonzo Candy.

## 2012-06-10 NOTE — Care Management Note (Signed)
    Page 1 of 1   06/10/2012     10:24:56 AM   CARE MANAGEMENT NOTE 06/10/2012  Patient:  Gloria Best, Gloria Best   Account Number:  [id number]  Date Initiated:  06/10/2012  Documentation initiated by:  Rozanna Boer  Subjective/Objective Assessment:   POD#1 s/p Right THA  will need HHPT  per patient, has all DME     Action/Plan:   home with [facility] services   Anticipated DC Date:  06/12/2012   Anticipated DC Plan:  [place]  CM consult      Choice offered to / List presented to:             Status of service:  Completed, signed off Medicare Important Message given?   (If response is "NO", the following Medicare IM given date fields will be blank) Date Medicare IM given:   Date Additional Medicare IM given:    Discharge Disposition:    Per UR Regulation:  Reviewed for med. necessity/level of care/duration of stay  If discussed at [place] of Stay Meetings, dates discussed:    Comments:  06/10/12 10:15  Rozanna Boer RN/CM Spoke with patient regarding d/c plans, per patient/family-plan is to d/c home with [facility] services. Per patient she has RW/BSC/Tub/shower bench daughter to provide 24/7h assistance. MD office has pre-arranged for [facility] services with [place].

## 2012-06-10 NOTE — Progress Notes (Signed)
PT TREATMENT   06/10/12 1400  PT Visit Information  Last PT Received On 06/10/12  Assistance Needed +1  PT Time Calculation  PT Start Time 1407  PT Stop Time 1422  PT Time Calculation (min) 15 min  Precautions  Precautions Posterior Hip  Precaution Booklet Issued Yes (comment)  Precaution Comments Pt able to recall 3/3 hip precautions  Restrictions  Weight Bearing Restrictions Yes  RLE Weight Bearing WBAT  Cognition  Overall Cognitive Status Appears within functional limits for tasks assessed/performed  Arousal/Alertness Awake/alert  Orientation Level Appears intact for tasks assessed  Behavior During Session [facility] for tasks performed  Bed Mobility  Bed Mobility Not assessed  Transfers  Transfers Not assessed  Ambulation/Gait  Ambulation/Gait Assistance Not tested (comment)  Exercises  Exercises Total Joint  Total Joint Exercises  Quad Sets Strengthening;Right;10 reps;AROM;Supine  Gluteal Sets Strengthening;10 reps;Right;Supine  Hip ABduction/ADduction AAROM;Right;10 reps;Supine  Heel Slides AAROM;Strengthening;Right;10 reps;Supine  Straight Leg Raises AAROM;Strengthening;Right;10 reps;Supine  PT - End of Session  Activity Tolerance Treatment limited secondary to medical complications (Comment)  Patient left in bed;with call bell/phone within reach  Nurse Communication Mobility status;Patient requests pain meds  PT - Assessment/Plan  Comments on Treatment Session Pt still feeling dizzy this session and reports just getting back to bed, therefore did not want to complete mobility. Therex performed in bed, pt with good strength improvements.  PT Plan Discharge plan remains appropriate;Frequency remains appropriate  PT Frequency 7X/week  Follow Up Recommendations Home health PT  Equipment Recommended None recommended by PT  Acute Rehab PT Goals  PT Goal Formulation With patient  PT Goal: Perform Home Exercise Program - Progress Progressing toward goal  PT General Charges    $$ ACUTE PT VISIT 1 Procedure  PT Treatments  $Therapeutic Exercise 8-22 mins    06/10/2012 Ambrose Finland DPT PAGER: [phone] OFFICE: [id number]

## 2012-06-10 NOTE — Progress Notes (Signed)
Referral received for SNF. Chart reviewed and CSW has spoken with RNCM who indicates that patient is for DC to home with Home Health and DME. PT recommends home with Home Health.  CSW to sign off. Please re-consult if CSW needs arise.  Lorie Phenix. [place], [place]

## 2012-06-10 NOTE — Progress Notes (Signed)
Patient ID: Gloria Best, female   DOB: 1941-01-09, 71 y.o.   MRN: [id number] Awake and alert will d.c knee imobilzer and start with pillow between legs to abduct legs while in bed.

## 2012-06-10 NOTE — Progress Notes (Signed)
Subjective: 1 Day Post-Op Procedure(s) (LRB): TOTAL HIP ARTHROPLASTY (Right) Patient reports pain as mild.  Oversedated yesterday and required Narcan.  Pain well controlled currently with little analgesics.  IV infiltrated right hand.  Some edema.  Not painful Pt with h/o anemia and required previous transfusions.   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  Will continue foley today.  UA on admission with signs of UTI.  Pt received pre op abx and gentamycin.  Objective: Vital signs in last 24 hours: Temp:  [96.8 F (36 C)-99.7 F (37.6 C)] 98 F (36.7 C) (08/06 0559) Pulse Rate:  [57-78] 76  (08/06 0559) Resp:  [5-20] 16  (08/06 0559) BP: (102-156)/(43-93) 118/52 mmHg (08/06 0559) SpO2:  [93 %-100 %] 97 % (08/06 0559)  Intake/Output from previous day: 08/05 0701 - 08/06 0700 In: 2940 [P.O.:240; I.V.:2650; IV Piggyback:50] Out: 2875 [Urine:2500; Drains:75; Blood:300] Intake/Output this shift:     Basename 06/10/12 0620  HGB 8.9*    Basename 06/10/12 0620  WBC 18.3*  RBC 3.42*  HCT 27.8*  PLT 280    Basename 06/10/12 0620  NA 138  K 3.8  CL 101  CO2 29  BUN 17  CREATININE 1.08  GLUCOSE 146*  CALCIUM 8.6   No results found for this basename: LABPT:2,INR:2 in the last 72 hours  Neurovascular intact Sensation intact distally Intact pulses distally Dorsiflexion/Plantar flexion intact Incision: dressing C/D/I hemovac removed without difficulty  Assessment/Plan: 1 Day Post-Op Procedure(s) (LRB): TOTAL HIP ARTHROPLASTY (Right) Up with therapy Continue foley due to neurogenic bladder and urinary retention with pt requiring self cath program which she currently cannot preform Acute blood loss anemia. H/o anemia with previous transfusions.  Currently asymptomatic and vital signs stable.  Will monitor .  Pt on Vit B and multivitamin.  Will add iron supplement. DC PCA and use po meds only for pain control Monitor CBGs and use SSI. Pt eating  breakfast and will resume oral meds today for DM  Fergie Sherbert M 06/10/2012, 8:18 AM

## 2012-06-10 NOTE — Evaluation (Signed)
Physical Therapy Evaluation Patient Details Name: Gloria Best MRN: [id number] DOB: 11-20-1940 Today's Date: 06/10/2012 Time: [id number] PT Time Calculation (min): 21 min  PT Assessment / Plan / Recommendation Clinical Impression  Pt is s/p R THA and presents with decreased I with functional mobility.  Pt moved well, but limited by dizziness.  Anticipate that pt should progress well in acute stay and could d/c home with family and HHPT.    PT Assessment  Patient needs continued PT services    Follow Up Recommendations  Home health PT    Barriers to Discharge None      Equipment Recommendations  None recommended by PT         Frequency 7X/week    Precautions / Restrictions Precautions Precautions: Posterior Hip Precaution Booklet Issued: Yes (comment) Precaution Comments: Reviwed illustrated hand out with pt. Restrictions Weight Bearing Restrictions: Yes RLE Weight Bearing: Weight bearing as tolerated   Pertinent Vitals/Pain Pain 5/10.  Hgb 8.9      Mobility  Bed Mobility Details for Bed Mobility Assistance: Pt sitting at EOB upon arrival.  CNA reports she helped pt with MIN A. Transfers Transfers: Sit to Stand;Stand to Sit Sit to Stand: 4: Min assist;From bed Stand to Sit: 4: Min assist;To chair/3-in-1 Ambulation/Gait Ambulation/Gait Assistance: 4: Min assist Ambulation Distance (Feet): 3 Feet Assistive device: Rolling walker Ambulation/Gait Assistance Details: Pt's gait limited by dizziness.  Hgb 8.9 Gait Pattern: Step-to pattern General Gait Details: Limited gait assessment due to short distance.    Exercises Total Joint Exercises Quad Sets: Strengthening;Right;Seated;10 reps Gluteal Sets: Strengthening;Both;10 reps;Seated Hip ABduction/ADduction: AAROM;Right;10 reps;Seated   PT Diagnosis: Difficulty walking  PT Problem List: Decreased strength;Decreased range of motion;Decreased mobility;Decreased knowledge of use of DME;Decreased knowledge of  precautions PT Treatment Interventions: Gait training;DME instruction;Functional mobility training;Therapeutic activities;Therapeutic exercise   PT Goals Acute Rehab PT Goals PT Goal Formulation: With patient Time For Goal Achievement: 06/17/12 Potential to Achieve Goals: Good Pt will go Supine/Side to Sit: with supervision PT Goal: Supine/Side to Sit - Progress: Goal set today Pt will go Sit to Supine/Side: with supervision PT Goal: Sit to Supine/Side - Progress: Goal set today Pt will go Sit to Stand: with supervision PT Goal: Sit to Stand - Progress: Goal set today Pt will go Stand to Sit: with supervision PT Goal: Stand to Sit - Progress: Goal set today Pt will Ambulate: 16 - 50 feet;with supervision;with rolling walker PT Goal: Ambulate - Progress: Goal set today Pt will Perform Home Exercise Program: with supervision, verbal cues required/provided PT Goal: Perform Home Exercise Program - Progress: Goal set today  Visit Information  Last PT Received On: 06/10/12    Subjective Data  Subjective: "I get dizzy easily."  Pt's Hgb 8.9 Patient Stated Goal: To go home with family support.     Prior Functioning  Home Living Lives With: Family Available Help at Discharge: Family Home Access: Ramped entrance [place]: Two level;Able to live on main level with bedroom/bathroom Bathroom Shower/Tub: Walk-in shower Home Adaptive Equipment: Bedside commode/3-in-1;Walker - rolling;Shower chair with back Prior Function Level of Independence: Independent with assistive device(s) Driving: No Vocation: Retired    Associate Professor  Overall Cognitive Status: Appears within functional limits for tasks assessed/performed Arousal/Alertness: Awake/alert Orientation Level: Appears intact for tasks assessed Behavior During Session: [facility] for tasks performed    Extremity/Trunk Assessment Right Lower Extremity Assessment RLE ROM/Strength/Tone: Deficits;Due to pain RLE ROM/Strength/Tone Deficits:  Limited due to pain and swelling. Left Lower Extremity Assessment LLE ROM/Strength/Tone: [facility]  for tasks assessed   Balance    End of Session PT - End of Session Equipment Utilized During Treatment: Gait belt Activity Tolerance: Treatment limited secondary to medical complications (Comment) (dizziness) Patient left: in chair;with call bell/phone within reach Nurse Communication: Other (comment) (dizziness)  GP     Kelson Queenan LUBECK 06/10/2012, 11:08 AM

## 2012-06-10 NOTE — Progress Notes (Signed)
Occupational Therapy Evaluation Patient Details Name: Gloria Best MRN: [id number] DOB: [birth year] Today's Date: 06/10/2012 Time: [id number] OT Time Calculation (min): 35 min  OT Assessment / Plan / Recommendation Clinical Impression  71 yo s/p post R THA. Pt will benefit from skilled OT services to max independence and safety with ADL and functional mobility for ADL to facilitate d/c home with 24/7 S of family. Pt limited today by c/o n/v - feeling lightheaded. Pt most likely to D/C toward end of week.    OT Assessment  Patient needs continued OT Services    Follow Up Recommendations  Home health OT    Barriers to Discharge None    Equipment Recommendations  None recommended by OT    Recommendations for Other Services    Frequency  Min 3X/week    Precautions / Restrictions Precautions Precautions: Posterior Hip Precaution Booklet Issued: Yes (comment) Precaution Comments: Pt able to recall 3/3 hip precautions Restrictions Weight Bearing Restrictions: Yes RLE Weight Bearing: Weight bearing as tolerated   Pertinent Vitals/Pain 3    ADL  Eating/Feeding: Independent Grooming: Simulated;Set up Where Assessed - Grooming: Supported sitting Upper Body Bathing: Simulated;Set up Where Assessed - Upper Body Bathing: Supported sitting Lower Body Bathing: Simulated;Maximal assistance Where Assessed - Lower Body Bathing: Supported sit to stand Upper Body Dressing: Simulated;Set up Where Assessed - Upper Body Dressing: Supported sitting Lower Body Dressing: Simulated;Maximal assistance Where Assessed - Lower Body Dressing: Supported sit to stand Toilet Transfer: Pharmacologist Method: Sit to Loss adjuster, chartered: Other (comment) (bed - chair) Toileting - Clothing Manipulation and Hygiene: Simulated;Minimal assistance Where Assessed - Best boy and Hygiene: Standing Tub/Shower Transfer: Simulated;Minimal  assistance Transfers/Ambulation Related to ADLs: min A RW level ADL Comments: no knowledge of hip precautions or AE    OT Diagnosis: Generalized weakness;Acute pain  OT Problem List: Decreased strength;Decreased range of motion;Decreased activity tolerance;Decreased safety awareness;Decreased knowledge of use of DME or AE;Decreased knowledge of precautions;Pain OT Treatment Interventions: Self-care/ADL training;Energy conservation;DME and/or AE instruction;Therapeutic activities;Patient/family education   OT Goals Acute Rehab OT Goals OT Goal Formulation: With patient Time For Goal Achievement: 06/17/12 Potential to Achieve Goals: Good ADL Goals Pt Will Perform Lower Body Dressing: with supervision;with caregiver independent in assisting;Sit to stand from chair;Unsupported;with adaptive equipment;with cueing (comment type and amount) ADL Goal: Lower Body Dressing - Progress: Goal set today Pt Will Transfer to Toilet: with supervision;with caregiver independent in assisting;with DME;3-in-1;Maintaining hip precautions ADL Goal: Toilet Transfer - Progress: Goal set today Pt Will Perform Toileting - Clothing Manipulation: with modified independence;Standing ADL Goal: Toileting - Clothing Manipulation - Progress: Goal set today Pt Will Perform Toileting - Hygiene: with modified independence;Standing at 3-in-1/toilet ADL Goal: Toileting - Hygiene - Progress: Goal set today Pt Will Perform Tub/Shower Transfer: with min assist;Shower seat with back;Maintaining hip precautions;with DME;Ambulation ADL Goal: Tub/Shower Transfer - Progress: Goal set today Additional ADL Goal #1: Pt will demonstrate 3/3 hip precautions with S during ADL task. ADL Goal: Additional Goal #1 - Progress: Goal set today  Visit Information  Last OT Received On: 06/10/12 Assistance Needed: +1    Subjective Data      Prior Functioning  Vision/Perception  Home Living Lives With: Alone;Family (family stays at night.  grand daughter to help during the day) Available Help at Discharge: Family Type of Home: House Home Access: Ramped entrance Home Layout: Two level;Able to live on main level with bedroom/bathroom Bathroom Shower/Tub: Multimedia programmer: Standard Home Adaptive Equipment: Bedside  commode/3-in-1;Walker - rolling;Shower chair with back Prior Function Level of Independence: Independent with assistive device(s) Able to Take Stairs?: No Driving: No Vocation: Retired Corporate investment banker: No difficulties Dominant Hand: Right      Cognition  Overall Cognitive Status: Appears within functional limits for tasks assessed/performed Arousal/Alertness: Awake/alert Orientation Level: Appears intact for tasks assessed Behavior During Session: [facility] for tasks performed    Extremity/Trunk Assessment Right Upper Extremity Assessment RUE ROM/Strength/Tone: Deficits RUE ROM/Strength/Tone Deficits: @ 30 deg R FF ? RTC injury Left Upper Extremity Assessment LUE ROM/Strength/Tone: Within functional levels Right Lower Extremity Assessment RLE ROM/Strength/Tone: Deficits RLE Sensation: History of peripheral neuropathy Left Lower Extremity Assessment LLE Sensation: History of peripheral neuropathy Trunk Assessment Trunk Assessment: Normal   Mobility Bed Mobility Bed Mobility: Supine to Sit Supine to Sit: 4: Min assist;HOB elevated Details for Bed Mobility Assistance: assist with RLE minimally Transfers Transfers: Sit to Stand;Stand to Sit Sit to Stand: 4: Min assist;From bed Stand to Sit: 4: Min assist;To chair/3-in-1   Exercise Total Joint Exercises Quad Sets: Strengthening;Right;10 reps;AROM;Supine Gluteal Sets: Strengthening;10 reps;Right;Supine Heel Slides: AAROM;Strengthening;Right;10 reps;Supine Hip ABduction/ADduction: AAROM;Right;10 reps;Supine Straight Leg Raises: AAROM;Strengthening;Right;10 reps;Supine  Balance    End of Session OT - End of Session Equipment  Utilized During Treatment: Gait belt Activity Tolerance: Patient tolerated treatment well Patient left: in chair;with call bell/phone within reach Nurse Communication: Mobility status  GO     [place],HILLARY 06/10/2012, 4:02 PM

## 2012-06-11 LAB — CBC
Platelets: 244 10*3/uL (ref 150–400)
RBC: 2.95 MIL/uL — ABNORMAL LOW (ref 3.87–5.11)
WBC: 16.4 10*3/uL — ABNORMAL HIGH (ref 4.0–10.5)

## 2012-06-11 LAB — GLUCOSE, CAPILLARY
Glucose-Capillary: 115 mg/dL — ABNORMAL HIGH (ref 70–99)
Glucose-Capillary: 109 mg/dL — ABNORMAL HIGH (ref 70–99)

## 2012-06-11 MED ORDER — ACETAMINOPHEN 325 MG PO TABS
650.0000 mg | ORAL_TABLET | Freq: Once | ORAL | Status: DC
  Filled 2012-06-11: qty 2

## 2012-06-11 NOTE — Progress Notes (Signed)
Occupational Therapy Treatment Patient Details Name: Gloria Best MRN: [id number] DOB: October 09, 1941 Today's Date: 06/11/2012 Time: [id number] OT Time Calculation (min): 30 min  OT Assessment / Plan / Recommendation Comments on Treatment Session Pt making progress. Should be able to D/C home toward end of week. Needs further practice with aE.    Follow Up Recommendations  Home health OT    Barriers to Discharge       Equipment Recommendations  None recommended by OT    Recommendations for Other Services    Frequency Min 3X/week   Plan Discharge plan remains appropriate    Precautions / Restrictions Precautions Precautions: Posterior Hip Precaution Booklet Issued: Yes (comment) Precaution Comments: able to recall 3/3 with min vc Restrictions RLE Weight Bearing: Weight bearing as tolerated   Pertinent Vitals/Pain 3    ADL  Lower Body Bathing: Simulated;Supervision/safety Lower Body Dressing: Performed;Minimal assistance;Other (comment) (with use of AE) Where Assessed - Lower Body Dressing: Supported sit to stand Transfers/Ambulation Related to ADLs: min A RW level ADL Comments: Increased knowledge of hip precautions. Min a for use of AE. given written information on availability of AE. No DME needs    OT Diagnosis:    OT Problem List:   OT Treatment Interventions:     OT Goals Acute Rehab OT Goals OT Goal Formulation: With patient Time For Goal Achievement: 06/17/12 Potential to Achieve Goals: Good ADL Goals Pt Will Perform Lower Body Dressing: with supervision;with caregiver independent in assisting;Sit to stand from chair;Unsupported;with adaptive equipment;with cueing (comment type and amount) ADL Goal: Lower Body Dressing - Progress: Progressing toward goals Pt Will Transfer to Toilet: with supervision;with caregiver independent in assisting;with DME;3-in-1;Maintaining hip precautions ADL Goal: Toilet Transfer - Progress: Progressing toward goals Pt Will Perform  Toileting - Clothing Manipulation: with modified independence;Standing ADL Goal: Toileting - Clothing Manipulation - Progress: Progressing toward goals Pt Will Perform Toileting - Hygiene: with modified independence;Standing at 3-in-1/toilet ADL Goal: Toileting - Hygiene - Progress: Progressing toward goals Pt Will Perform Tub/Shower Transfer: with min assist;Shower seat with back;Maintaining hip precautions;with DME;Ambulation ADL Goal: Tub/Shower Transfer - Progress: Progressing toward goals Additional ADL Goal #1: Pt will demonstrate 3/3 hip precautions with S during ADL task. ADL Goal: Additional Goal #1 - Progress: Progressing toward goals  Visit Information  Last OT Received On: 06/11/12 Assistance Needed: +1    Subjective Data      Prior Functioning       Cognition       Mobility Bed Mobility Bed Mobility: Supine to Sit Supine to Sit: 4: Min assist;HOB flat;With rails Details for Bed Mobility Assistance: assist with RLE minimally Transfers Transfers: Sit to Stand;Stand to Sit Sit to Stand: 4: Min assist;From bed Stand to Sit: 4: Min assist;To chair/3-in-1 Details for Transfer Assistance: vc for safety and hand placement   Exercises    Balance    End of Session OT - End of Session Equipment Utilized During Treatment: Gait belt Activity Tolerance: Patient tolerated treatment well Patient left: in chair;with call bell/phone within reach Nurse Communication: Mobility status  GO     Donise Woodle,HILLARY 06/11/2012, 11:11 AM Maurie Boettcher, OTR/L  [phone] 06/11/2012

## 2012-06-11 NOTE — Progress Notes (Signed)
Patient examined and lab reviewed with Vernon PA-C. 

## 2012-06-11 NOTE — Progress Notes (Signed)
Subjective: 2 Days Post-Op Procedure(s) (LRB): TOTAL HIP ARTHROPLASTY (Right) Patient reports pain as mild.  Controlled with po meds.  Walked in room yesterday Still needs cath as she cannot move the right leg independently and would require cath program for her neurogenic bladder. Passive manipulation of her leg may increase risk of dislocation of her hip. Last UA on 8/5 with elevated WBCs but improved from the one on 8/2 with [facility] after treatment with po abx and periop IV abx.  Will repeat now and consider empiric coverage with Nitrofurantoin  Objective: Vital signs in last 24 hours: Temp:  [97.5 F (36.4 C)-98.3 F (36.8 C)] 97.9 F (36.6 C) (08/07 0619) Pulse Rate:  [60-95] 84  (08/07 0619) Resp:  [18] 18  (08/07 0619) BP: (112-152)/(47-81) 136/80 mmHg (08/07 0619) SpO2:  [94 %-99 %] 97 % (08/07 0619)  Intake/Output from previous day: 08/06 0701 - 08/07 0700 In: 1080 [P.O.:1080] Out: -  Intake/Output this shift:     Basename 06/11/12 0553 06/10/12 0620  HGB 8.2* 8.9*    Basename 06/11/12 0553 06/10/12 0620  WBC 16.4* 18.3*  RBC 2.95* 3.42*  HCT 24.1* 27.8*  PLT 244 280    Basename 06/10/12 0620  NA 138  K 3.8  CL 101  CO2 29  BUN 17  CREATININE 1.08  GLUCOSE 146*  CALCIUM 8.6   No results found for this basename: LABPT:2,INR:2 in the last 72 hours  Neurovascular intact Sensation intact distally Intact pulses distally Dorsiflexion/Plantar flexion intact Incision: no drainage  Assessment/Plan: 2 Days Post-Op Procedure(s) (LRB): TOTAL HIP ARTHROPLASTY (Right) Continue foley due to acute urinary retention and neurogenic bladder requiring self cath which at this time may increase risk of hip dislocation with manipulation of right leg.  will plan to dc on thursday before anticipated dc to home on friday where family will be doing the cath for pt. ABL anemia with continued decrease in HGB.  Will transfuse today with 2 units PRBCs Dressing change today. Cont  ice packs.   Pradaxa for VTE coverage  Anaira Seay M 06/11/2012, 8:32 AM

## 2012-06-12 DIAGNOSIS — D62 Acute posthemorrhagic anemia: Secondary | ICD-10-CM

## 2012-06-12 HISTORY — DX: Acute posthemorrhagic anemia: D62

## 2012-06-12 LAB — CBC
HCT: 33.7 % — ABNORMAL LOW (ref 36.0–46.0)
MCHC: 33.8 g/dL (ref 30.0–36.0)
MCV: 81.4 fL (ref 78.0–100.0)
RDW: 16.5 % — ABNORMAL HIGH (ref 11.5–15.5)
WBC: 16.6 10*3/uL — ABNORMAL HIGH (ref 4.0–10.5)

## 2012-06-12 LAB — GLUCOSE, CAPILLARY
Glucose-Capillary: 149 mg/dL — ABNORMAL HIGH (ref 70–99)
Glucose-Capillary: 117 mg/dL — ABNORMAL HIGH (ref 70–99)
Glucose-Capillary: 126 mg/dL — ABNORMAL HIGH (ref 70–99)

## 2012-06-12 LAB — URINALYSIS, ROUTINE W REFLEX MICROSCOPIC
Glucose, UA: NEGATIVE mg/dL
Ketones, ur: NEGATIVE mg/dL
Protein, ur: 100 mg/dL — AB
Urobilinogen, UA: 0.2 mg/dL (ref 0.0–1.0)

## 2012-06-12 LAB — TYPE AND SCREEN: Unit division: 0

## 2012-06-12 MED ORDER — TRAMADOL HCL 50 MG PO TABS: 50.0000 mg | ORAL_TABLET | Freq: Four times a day (QID) | ORAL | Status: AC | PRN

## 2012-06-12 MED ORDER — AMOXICILLIN-POT CLAVULANATE 500-125 MG PO TABS: 1.0000 | ORAL_TABLET | Freq: Three times a day (TID) | ORAL | Status: AC

## 2012-06-12 MED ORDER — AMOXICILLIN-POT CLAVULANATE 500-125 MG PO TABS
1.0000 | ORAL_TABLET | Freq: Three times a day (TID) | ORAL | Status: DC
  Administered 2012-06-12 – 2012-06-13 (×3): 500 mg via ORAL
  Filled 2012-06-12 (×5): qty 1

## 2012-06-12 MED ORDER — METHOCARBAMOL 500 MG PO TABS: 500.0000 mg | ORAL_TABLET | Freq: Four times a day (QID) | ORAL | Status: AC

## 2012-06-12 MED ORDER — OXYCODONE-ACETAMINOPHEN 5-325 MG PO TABS: 1.0000 | ORAL_TABLET | ORAL | Status: DC | PRN

## 2012-06-12 MED ORDER — OXYCODONE-ACETAMINOPHEN 5-325 MG PO TABS: 1.0000 | ORAL_TABLET | ORAL | Status: AC | PRN

## 2012-06-12 NOTE — Progress Notes (Signed)
Occupational Therapy Treatment Patient Details Name: Gloria Best MRN: [id number] DOB: 11/01/1941 Today's Date: 06/12/2012 Time: 0916-1000 OT Time Calculation (min): 44 min  OT Assessment / Plan / Recommendation Comments on Treatment Session Pt is knowledgeable in hip precautions.  Needs a 3 in 1 for home.  No interest in use of assistive devices for LB ADL, will rely on family.    Follow Up Recommendations  Home health OT    Barriers to Discharge       Equipment Recommendations  3 in 1 bedside comode    Recommendations for Other Services    Frequency Min 3X/week   Plan Discharge plan remains appropriate    Precautions / Restrictions Precautions Precautions: Fall;Posterior Hip Precaution Comments: able to state and follow hip precautions independently Restrictions Weight Bearing Restrictions: Yes RLE Weight Bearing: Weight bearing as tolerated   Pertinent Vitals/Pain Soreness in R hip, did not rate, RN notified    ADL  Grooming: Performed;Denture care;Teeth care;Wash/dry face;Set up Where Assessed - Grooming: Supported standing;Unsupported sitting Upper Body Bathing: Performed;Set up Where Assessed - Upper Body Bathing: Unsupported sitting Lower Body Bathing: Performed;Supervision/safety Where Assessed - Lower Body Bathing: Supported sit to stand Upper Body Dressing: Performed;Set up Where Assessed - Upper Body Dressing: Unsupported sitting Lower Body Dressing: Simulated;Minimal assistance Where Assessed - Lower Body Dressing: Supported sit to stand Toilet Transfer: Performed;Min guard Armed forces technical officer Method: Sit to Loss adjuster, chartered: Raised toilet seat with arms (or 3-in-1 over toilet) Toileting - Clothing Manipulation and Hygiene: Simulated;Maximal assistance (pt unable to release walker to perform pericare) Where Assessed - Toileting Clothing Manipulation and Hygiene: Standing Tub/Shower Transfer: Min guard;Performed Tub/Shower Transfer Method:  Ambulating Equipment Used: Rolling walker;Reacher;Long-handled sponge;Long-handled shoe horn;Sock aid;Gait belt Transfers/Ambulation Related to ADLs: supervision with RW ADL Comments: Pt is knowledgeable of hip precautions and generalizes.  She is not interested in AE, will rely on her family to assist her with LB ADL.    OT Diagnosis:    OT Problem List:   OT Treatment Interventions:     OT Goals ADL Goals Pt Will Perform Lower Body Dressing: with supervision;with caregiver independent in assisting;Sit to stand from chair;Unsupported;with adaptive equipment;with cueing (comment type and amount) ADL Goal: Lower Body Dressing - Progress: Discontinued (comment) (pt will rely on family) Pt Will Transfer to Toilet: with supervision;with caregiver independent in assisting;with DME;3-in-1;Maintaining hip precautions ADL Goal: Toilet Transfer - Progress: Met Pt Will Perform Toileting - Clothing Manipulation: with modified independence;Standing ADL Goal: Toileting - Clothing Manipulation - Progress: Progressing toward goals Pt Will Perform Toileting - Hygiene: with modified independence;Standing at 3-in-1/toilet ADL Goal: Toileting - Hygiene - Progress: Progressing toward goals Pt Will Perform Tub/Shower Transfer: with min assist;Shower seat with back;Maintaining hip precautions;with DME;Ambulation ADL Goal: Tub/Shower Transfer - Progress: Met Additional ADL Goal #1: Pt will demonstrate 3/3 hip precautions with S during ADL task. ADL Goal: Additional Goal #1 - Progress: Met  Visit Information  Last OT Received On: 06/12/12 Assistance Needed: +1    Subjective Data      Prior Functioning       Cognition  Overall Cognitive Status: Appears within functional limits for tasks assessed/performed Arousal/Alertness: Awake/alert Orientation Level: Appears intact for tasks assessed Behavior During Session: [facility] for tasks performed    Mobility Bed Mobility Bed Mobility: Supine to Sit Supine to  Sit: 6: Modified independent (Device/Increase time);HOB elevated;With rails Transfers Transfers: Sit to Stand;Stand to Sit Sit to Stand: 5: Supervision;With upper extremity assist;From chair/3-in-1 Stand to Sit: 4:  Min guard;To chair/3-in-1 Details for Transfer Assistance: verbal cues for technique   Exercises    Balance    End of Session OT - End of Session Activity Tolerance: Patient tolerated treatment well Patient left: in chair;with call bell/phone within reach Nurse Communication: Patient requests pain meds  GO     Malka So 06/12/2012, 10:12 AM [phone]

## 2012-06-12 NOTE — Progress Notes (Signed)
Subjective: 3 Days Post-Op Procedure(s) (LRB): TOTAL HIP ARTHROPLASTY (Right) Patient reports pain as mild.  Progressing well with activity. Tolerated transfusion well with good response of Hgb to 11.4  Objective: Vital signs in last 24 hours: Temp:  [97.3 F (36.3 C)-98.9 F (37.2 C)] 98.1 F (36.7 C) (08/08 0616) Pulse Rate:  [53-70] 65  (08/08 0616) Resp:  [16-20] 16  (08/08 0616) BP: (97-137)/(42-64) 134/64 mmHg (08/08 0616) SpO2:  [95 %-98 %] 95 % (08/08 0616)  Intake/Output from previous day: 08/07 0701 - 08/08 0700 In: 1540 [P.O.:840; Blood:700] Out: 2175 [Urine:2175] Intake/Output this shift: Total I/O In: 480 [P.O.:480] Out: -    Basename 06/12/12 0617 06/11/12 0553 06/10/12 0620  HGB 11.4* 8.2* 8.9*    Basename 06/12/12 0617 06/11/12 0553  WBC 16.6* 16.4*  RBC 4.14 2.95*  HCT 33.7* 24.1*  PLT 210 244    Basename 06/10/12 0620  NA 138  K 3.8  CL 101  CO2 29  BUN 17  CREATININE 1.08  GLUCOSE 146*  CALCIUM 8.6   No results found for this basename: LABPT:2,INR:2 in the last 72 hours  Neurovascular intact Sensation intact distally Intact pulses distally Dorsiflexion/Plantar flexion intact Incision: dressing C/D/I  Assessment/Plan: 3 Days Post-Op Procedure(s) (LRB): TOTAL HIP ARTHROPLASTY (Right) Plan for discharge tomorrow Will dc foley today after therapy and begin her usual program if I and O cath q6h or prn to prepare for home tomorrow Get UA and C&S from foley specimen today. At dc will continue Pradaxa for VTE coverage.  RX for perocet and robaxin on chart   Gloria Best M 06/12/2012, 9:03 AM

## 2012-06-12 NOTE — Progress Notes (Signed)
Patient examined and lab reviewed with Vernon PA-C. 

## 2012-06-13 LAB — GLUCOSE, CAPILLARY: Glucose-Capillary: 121 mg/dL — ABNORMAL HIGH (ref 70–99)

## 2012-06-13 NOTE — Discharge Summary (Signed)
Patient examined and d/c summary reviewed with Lonzo Candy.

## 2012-06-13 NOTE — Progress Notes (Signed)
Patient examined and lab reviewed with Vernon PA-C. 

## 2012-06-13 NOTE — Progress Notes (Signed)
Subjective: 4 Days Post-Op Procedure(s) (LRB): TOTAL HIP ARTHROPLASTY (Right) Patient reports pain as mild.    Objective: Vital signs in last 24 hours: Temp:  [98.3 F (36.8 C)-98.7 F (37.1 C)] 98.3 F (36.8 C) (08/09 0531) Pulse Rate:  [60-70] 67  (08/09 0531) Resp:  [16-18] 18  (08/09 0800) BP: (126-143)/(55-63) 143/63 mmHg (08/09 0531) SpO2:  [94 %-95 %] 95 % (08/09 0531)  Intake/Output from previous day: 08/08 0701 - 08/09 0700 In: 480 [P.O.:480] Out: 1650 [Urine:1650] Intake/Output this shift: Total I/O In: 240 [P.O.:240] Out: 350 [Urine:350]   Basename 06/12/12 0617 06/11/12 0553  HGB 11.4* 8.2*    Basename 06/12/12 0617 06/11/12 0553  WBC 16.6* 16.4*  RBC 4.14 2.95*  HCT 33.7* 24.1*  PLT 210 244   No results found for this basename: NA:2,K:2,CL:2,CO2:2,BUN:2,CREATININE:2,GLUCOSE:2,CALCIUM:2 in the last 72 hours No results found for this basename: LABPT:2,INR:2 in the last 72 hours  Neurovascular intact Sensation intact distally Intact pulses distally Dorsiflexion/Plantar flexion intact Incision: no drainage  Assessment/Plan: 4 Days Post-Op Procedure(s) (LRB): TOTAL HIP ARTHROPLASTY (Right) Discharge home with home health  Maniah Nading M 06/13/2012, 10:46 AM

## 2012-06-13 NOTE — Progress Notes (Signed)
PT PROGRESS NOTE: 06/13/2012  06/13/12 1200  PT Visit Information  Last PT Received On 06/13/12  Assistance Needed +1  PT Time Calculation  PT Start Time 1230  PT Stop Time 1250  PT Time Calculation (min) 20 min  Subjective Data  Subjective Agree to eval  Patient Stated Goal To go home with family support.    Precautions  Precautions Fall;Posterior Hip  Precaution Booklet Issued Yes (comment)  Precaution Comments able to state and follow hip precautions independently  Restrictions  Weight Bearing Restrictions Yes  RLE Weight Bearing WBAT  Cognition  Overall Cognitive Status Appears within functional limits for tasks assessed/performed  Arousal/Alertness Awake/alert  Orientation Level Appears intact for tasks assessed  Behavior During Session [facility] for tasks performed  Bed Mobility  Bed Mobility Supine to Sit  Supine to Sit 6: Modified independent (Device/Increase time)  Transfers  Transfers Sit to Stand;Stand to Sit  Sit to Stand 5: Supervision;With upper extremity assist;From chair/3-in-1  Stand to Sit 5: Supervision  Details for Transfer Assistance verbal cues for technique  Ambulation/Gait  Ambulation/Gait Assistance 5: Supervision  Ambulation Distance (Feet) 40 Feet  Assistive device Rolling walker  Ambulation/Gait Assistance Details Cues for sequencing and WBAT.   Gait Pattern Step-to pattern  Stairs No  Wheelchair Mobility  Wheelchair Mobility No  PT - End of Session  Equipment Utilized During Treatment Gait belt  Activity Tolerance Patient tolerated treatment well  Patient left in chair;with call bell/phone within reach;with family/visitor present  Nurse Communication Mobility status;Patient requests pain meds  PT - Assessment/Plan  Comments on Treatment Session Pt should be safe for d/c home with HHPT follow-up  PT Plan Discharge plan remains appropriate;Frequency remains appropriate  PT Frequency 7X/week  Follow Up Recommendations Home health PT  Equipment  Recommended 3 in 1 bedside comode  Acute Rehab PT Goals  PT Goal Formulation With patient  Time For Goal Achievement 06/17/12  Potential to Achieve Goals Good  Pt will go Supine/Side to Sit with supervision  PT Goal: Supine/Side to Sit - Progress Met  Pt will go Sit to Supine/Side with supervision  PT Goal: Sit to Supine/Side - Progress Met  Pt will go Sit to Stand with supervision  PT Goal: Sit to Stand - Progress Met  Pt will go Stand to Sit with supervision  PT Goal: Stand to Sit - Progress Met  Pt will Ambulate 16 - 50 feet;with supervision;with rolling walker  PT Goal: Ambulate - Progress Met  Pt will Perform Home Exercise Program with supervision, verbal cues required/provided  PT Goal: Perform Home Exercise Program - Progress Met  PT General Charges  $$ ACUTE PT VISIT 1 Procedure  PT Treatments  $Gait Training 8-22 mins  $Therapeutic Activity 8-22 mins   Suzannah Bettes L. [place]aclara Acklin DPT [phone]

## 2012-06-13 NOTE — Progress Notes (Signed)
Utilization review completed. Bradrick Kamau, RN, BSN. 

## 2012-06-13 NOTE — Discharge Summary (Signed)
Physician Discharge Summary  Patient ID: Gloria Best MRN: [id number] DOB/AGE: September 25, 1941 71 y.o.  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 Complication of anesthesia     "some difficulty waking up"  . Dysrhythmia     takes cardizem, sees Dr. Marco Collie primary  . Heart murmur   . Depression     takes medication for   . Asthma     hx of as child  . Pneumonia     hx of  . Bronchitis     hx of  . Diabetes mellitus     oral medication  . Bladder disorder     having difficulty voiding, requires in and out catheter 2x day and prn  . Urinary tract infection     hx of  . H/O hiatal hernia     hx of   . GERD (gastroesophageal reflux disease)   . Carpal tunnel syndrome of left wrist     hx of  . Arthritis     RA  . Fibromyalgia     takes lyrica  . Anemia     hx of blood transfusion  . Chronic back pain   . Cataracts, bilateral     hx of  . Hypertension   . Shortness of breath   . Sleep apnea     Surgeries: Procedure(s): TOTAL HIP ARTHROPLASTY on 06/09/2012 right   Consultants (if any):  none  Discharged Condition: Improved  Hospital Course: [name] is an 71 y.o. female who was admitted 06/09/2012 with a diagnosis of Osteoarthritis of right hip and went to the operating room on 06/09/2012 and underwent the above named procedures.    She was given perioperative antibiotics:  Anti-infectives     Start     Dose/Rate Route Frequency Ordered Stop   06/12/12 1730   amoxicillin-clavulanate (AUGMENTIN) 500-125 MG per tablet 500 mg        1 tablet Oral 3 times daily 06/12/12 1657 06/19/12 1559   06/12/12 0000   amoxicillin-clavulanate (AUGMENTIN) 500-125 MG per  tablet        1 tablet Oral 3 times daily 06/12/12 1701 06/22/12 2359   06/09/12 1330   ceFAZolin (ANCEF) IVPB 1 g/50 mL premix     Comments: Preop dose 0745; AET 1004      1 g 100 mL/hr over 30 Minutes Intravenous Every 6 hours 06/09/12 1209 06/09/12 2039   06/08/12 1356   ceFAZolin (ANCEF) IVPB 2 g/50 mL premix        2 g 100 mL/hr over 30 Minutes Intravenous 60 min pre-op 06/08/12 1356 06/09/12 0745        .  She was given sequential compression devices, early ambulation, and Pradaxa  for DVT prophylaxis.  She benefited maximally from the hospital stay and there were no complications.    Recent vital signs:  Filed Vitals:   06/13/12 0800  BP:   Pulse:   Temp:   Resp: 18    Recent laboratory studies:  Lab Results  Component Value Date   HGB 11.4* 06/12/2012   HGB 8.2* 06/11/2012   HGB 8.9* 06/10/2012   Lab Results  Component Value Date   WBC 16.6* 06/12/2012   PLT 210 06/12/2012  Lab Results  Component Value Date   INR 0.93 07/06/2010   Lab Results  Component Value Date   NA 138 06/10/2012   K 3.8 06/10/2012   CL 101 06/10/2012   CO2 29 06/10/2012   BUN 17 06/10/2012   CREATININE 1.08 06/10/2012   GLUCOSE 146* 06/10/2012    Discharge Medications:   Medication List  As of 06/13/2012 10:50 AM   TAKE these medications         amoxicillin-clavulanate 500-125 MG per tablet   Commonly known as: AUGMENTIN   Take 1 tablet (500 mg total) by mouth 3 (three) times daily.      ARIPiprazole 5 MG tablet   Commonly known as: ABILIFY   Take 5 mg by mouth daily.      atorvastatin 40 MG tablet   Commonly known as: LIPITOR   Take 40 mg by mouth daily.      cloNIDine 0.1 MG tablet   Commonly known as: CATAPRES   Take 0.1 mg by mouth 2 (two) times daily.      dabigatran 150 MG Caps   Commonly known as: PRADAXA   Take 150 mg by mouth every other day.      diltiazem 360 MG 24 hr capsule   Commonly known as: CARDIZEM CD   Take 360 mg by mouth daily.      escitalopram 10 MG tablet     Commonly known as: LEXAPRO   Take 10 mg by mouth daily.      esomeprazole 40 MG capsule   Commonly known as: NEXIUM   Take 40 mg by mouth daily before breakfast.      folic acid 1 MG tablet   Commonly known as: FOLVITE   Take 1 mg by mouth daily.      furosemide 80 MG tablet   Commonly known as: LASIX   Take 80 mg by mouth daily.      methocarbamol 500 MG tablet   Commonly known as: ROBAXIN   Take 1 tablet (500 mg total) by mouth 4 (four) times daily.      OVER THE COUNTER MEDICATION   Place 1 drop into both eyes 2 (two) times daily as needed. For dry eyes      oxyCODONE-acetaminophen 5-325 MG per tablet   Commonly known as: PERCOCET/ROXICET   Take 1 tablet by mouth every 4 (four) hours as needed for pain.      potassium chloride SA 20 MEQ tablet   Commonly known as: K-DUR,KLOR-CON   Take 20 mEq by mouth daily.      pramipexole 0.5 MG tablet   Commonly known as: MIRAPEX   Take 0.5 mg by mouth at bedtime.      pregabalin 50 MG capsule   Commonly known as: LYRICA   Take 50-100 mg by mouth 2 (two) times daily. Takes 1 capsule in am and 2 at night      sitaGLIPtin 50 MG tablet   Commonly known as: JANUVIA   Take 50 mg by mouth daily.      solifenacin 10 MG tablet   Commonly known as: VESICARE   Take 10 mg by mouth daily.      traMADol 50 MG tablet   Commonly known as: ULTRAM   Take 1 tablet (50 mg total) by mouth every 6 (six) hours as needed.      traMADol 50 MG tablet   Commonly known as: ULTRAM   Take 50 mg by mouth every 6 (six) hours  as needed. For pain            Diagnostic Studies: Dg Chest 2 View  06/06/2012  *RADIOLOGY REPORT*  Clinical Data: Preop for right hip arthroplasty  CHEST - 2 VIEW  Comparison: 01/08/2012  Findings: Cardiomediastinal silhouette is stable.  Mild thoracic dextroscoliosis.  No acute infiltrate or pleural effusion. Postsurgical changes with metallic fixation material noted lumbar spine. No pulmonary edema.  IMPRESSION: No active  disease.  No significant change.  Original Report Authenticated By: Lahoma Crocker, M.D.   Dg Pelvis Portable  06/09/2012  *RADIOLOGY REPORT*  Clinical Data: Severe arthritis of the right hip.  PORTABLE PELVIS  Comparison: MRI dated 01/02/2012  Findings: A single AP portable view demonstrates the acetabular and femoral components to be in good position with no fractures.  Soft tissue drain is in place.  IMPRESSION: Satisfactory appearance of the right hip in the AP projection after total hip prosthesis insertion.  Original Report Authenticated By: Larey Seat, M.D.   Dg Hip Portable 1 View Right  06/09/2012  *RADIOLOGY REPORT*  Clinical Data: Severe arthritis of the right hip.  PORTABLE RIGHT HIP - 1 VIEW  Comparison: MRI dated 01/02/2012  Findings: Lateral portable view of the right hip demonstrates the acetabular and femoral components to be in excellent position in the lateral projection.  IMPRESSION: Satisfactory appearance of the right hip after total hip replacement.  Original Report Authenticated By: Larey Seat, M.D.    Disposition:   Discharge Orders    Future Orders Please Complete By Expires   Diet - low sodium heart healthy      Increase activity slowly as tolerated      Discharge instructions      Comments:   Keep knee incision dry for 5 days post op then may wet while bathing. Therapy daily . Call if fever or chills or increased drainage. Go to ER if acutely short of breath or call for ambulance. Return for follow up in 2 weeks. May full weight bear on the surgical leg unless told otherwise. In house walking for first 2 weeks.   Driving restrictions      Comments:   No driving for 3 weeks   Lifting restrictions      Comments:   No lifting for 6 weeks   Follow the hip precautions as taught in Physical Therapy      Change dressing      Comments:   You may change your dressing on 06/15/2012, then change the dressing daily with sterile 4 x 4 inch gauze dressing and paper  tape.   Call MD / Call 911      Comments:   If you experience chest pain or shortness of breath, CALL 911 and be transported to the hospital emergency room.  If you develope a fever above 101 F, pus (white drainage) or increased drainage or redness at the wound, or calf pain, call your surgeon's office.   Constipation Prevention      Comments:   Drink plenty of fluids.  Prune juice may be helpful.  You may use a stool softener, such as Colace (over the counter) 100 mg twice a day.  Use MiraLax (over the counter) for constipation as needed.      Follow-up Information    Follow up with NITKA,JAMES E, MD. Schedule an appointment as soon as possible for a visit in 2 weeks.   Contact information:   [facility] [address]  [place] [phone]           Signed: Epimenio Foot 06/13/2012, 10:50 AM

## 2012-06-14 DIAGNOSIS — E1142 Type 2 diabetes mellitus with diabetic polyneuropathy: Secondary | ICD-10-CM | POA: Diagnosis not present

## 2012-06-14 DIAGNOSIS — Z471 Aftercare following joint replacement surgery: Secondary | ICD-10-CM | POA: Diagnosis not present

## 2012-06-14 DIAGNOSIS — E1149 Type 2 diabetes mellitus with other diabetic neurological complication: Secondary | ICD-10-CM | POA: Diagnosis not present

## 2012-06-14 DIAGNOSIS — Z96649 Presence of unspecified artificial hip joint: Secondary | ICD-10-CM | POA: Diagnosis not present

## 2012-06-14 DIAGNOSIS — F329 Major depressive disorder, single episode, unspecified: Secondary | ICD-10-CM | POA: Diagnosis not present

## 2012-06-14 LAB — URINE CULTURE: Colony Count: >=100000

## 2012-06-16 DIAGNOSIS — F329 Major depressive disorder, single episode, unspecified: Secondary | ICD-10-CM | POA: Diagnosis not present

## 2012-06-16 DIAGNOSIS — Z471 Aftercare following joint replacement surgery: Secondary | ICD-10-CM | POA: Diagnosis not present

## 2012-06-16 DIAGNOSIS — E1149 Type 2 diabetes mellitus with other diabetic neurological complication: Secondary | ICD-10-CM | POA: Diagnosis not present

## 2012-06-16 DIAGNOSIS — Z96649 Presence of unspecified artificial hip joint: Secondary | ICD-10-CM | POA: Diagnosis not present

## 2012-06-16 DIAGNOSIS — E1142 Type 2 diabetes mellitus with diabetic polyneuropathy: Secondary | ICD-10-CM | POA: Diagnosis not present

## 2012-06-18 DIAGNOSIS — Z471 Aftercare following joint replacement surgery: Secondary | ICD-10-CM | POA: Diagnosis not present

## 2012-06-18 DIAGNOSIS — E1149 Type 2 diabetes mellitus with other diabetic neurological complication: Secondary | ICD-10-CM | POA: Diagnosis not present

## 2012-06-18 DIAGNOSIS — Z96649 Presence of unspecified artificial hip joint: Secondary | ICD-10-CM | POA: Diagnosis not present

## 2012-06-18 DIAGNOSIS — F329 Major depressive disorder, single episode, unspecified: Secondary | ICD-10-CM | POA: Diagnosis not present

## 2012-06-18 DIAGNOSIS — E1142 Type 2 diabetes mellitus with diabetic polyneuropathy: Secondary | ICD-10-CM | POA: Diagnosis not present

## 2012-06-20 DIAGNOSIS — E1149 Type 2 diabetes mellitus with other diabetic neurological complication: Secondary | ICD-10-CM | POA: Diagnosis not present

## 2012-06-20 DIAGNOSIS — M069 Rheumatoid arthritis, unspecified: Secondary | ICD-10-CM | POA: Diagnosis not present

## 2012-06-20 DIAGNOSIS — E1142 Type 2 diabetes mellitus with diabetic polyneuropathy: Secondary | ICD-10-CM | POA: Diagnosis not present

## 2012-06-20 DIAGNOSIS — F329 Major depressive disorder, single episode, unspecified: Secondary | ICD-10-CM | POA: Diagnosis not present

## 2012-06-20 DIAGNOSIS — Z471 Aftercare following joint replacement surgery: Secondary | ICD-10-CM | POA: Diagnosis not present

## 2012-06-20 DIAGNOSIS — Z96649 Presence of unspecified artificial hip joint: Secondary | ICD-10-CM | POA: Diagnosis not present

## 2012-06-23 DIAGNOSIS — F329 Major depressive disorder, single episode, unspecified: Secondary | ICD-10-CM | POA: Diagnosis not present

## 2012-06-23 DIAGNOSIS — E1149 Type 2 diabetes mellitus with other diabetic neurological complication: Secondary | ICD-10-CM | POA: Diagnosis not present

## 2012-06-23 DIAGNOSIS — Z471 Aftercare following joint replacement surgery: Secondary | ICD-10-CM | POA: Diagnosis not present

## 2012-06-23 DIAGNOSIS — Z96649 Presence of unspecified artificial hip joint: Secondary | ICD-10-CM | POA: Diagnosis not present

## 2012-06-23 DIAGNOSIS — E1142 Type 2 diabetes mellitus with diabetic polyneuropathy: Secondary | ICD-10-CM | POA: Diagnosis not present

## 2012-06-30 DIAGNOSIS — Z96649 Presence of unspecified artificial hip joint: Secondary | ICD-10-CM | POA: Diagnosis not present

## 2012-06-30 DIAGNOSIS — F329 Major depressive disorder, single episode, unspecified: Secondary | ICD-10-CM | POA: Diagnosis not present

## 2012-06-30 DIAGNOSIS — E1149 Type 2 diabetes mellitus with other diabetic neurological complication: Secondary | ICD-10-CM | POA: Diagnosis not present

## 2012-06-30 DIAGNOSIS — Z471 Aftercare following joint replacement surgery: Secondary | ICD-10-CM | POA: Diagnosis not present

## 2012-06-30 DIAGNOSIS — E1142 Type 2 diabetes mellitus with diabetic polyneuropathy: Secondary | ICD-10-CM | POA: Diagnosis not present

## 2012-07-01 DIAGNOSIS — Z471 Aftercare following joint replacement surgery: Secondary | ICD-10-CM | POA: Diagnosis not present

## 2012-07-01 DIAGNOSIS — F329 Major depressive disorder, single episode, unspecified: Secondary | ICD-10-CM | POA: Diagnosis not present

## 2012-07-01 DIAGNOSIS — E1142 Type 2 diabetes mellitus with diabetic polyneuropathy: Secondary | ICD-10-CM | POA: Diagnosis not present

## 2012-07-01 DIAGNOSIS — E1149 Type 2 diabetes mellitus with other diabetic neurological complication: Secondary | ICD-10-CM | POA: Diagnosis not present

## 2012-07-01 DIAGNOSIS — Z96649 Presence of unspecified artificial hip joint: Secondary | ICD-10-CM | POA: Diagnosis not present

## 2012-07-03 DIAGNOSIS — M25559 Pain in unspecified hip: Secondary | ICD-10-CM | POA: Diagnosis not present

## 2012-07-03 DIAGNOSIS — M169 Osteoarthritis of hip, unspecified: Secondary | ICD-10-CM | POA: Diagnosis not present

## 2012-07-04 DIAGNOSIS — Z471 Aftercare following joint replacement surgery: Secondary | ICD-10-CM | POA: Diagnosis not present

## 2012-07-04 DIAGNOSIS — Z96649 Presence of unspecified artificial hip joint: Secondary | ICD-10-CM | POA: Diagnosis not present

## 2012-07-04 DIAGNOSIS — F329 Major depressive disorder, single episode, unspecified: Secondary | ICD-10-CM | POA: Diagnosis not present

## 2012-07-04 DIAGNOSIS — E1142 Type 2 diabetes mellitus with diabetic polyneuropathy: Secondary | ICD-10-CM | POA: Diagnosis not present

## 2012-07-04 DIAGNOSIS — E1149 Type 2 diabetes mellitus with other diabetic neurological complication: Secondary | ICD-10-CM | POA: Diagnosis not present

## 2012-07-08 DIAGNOSIS — E1149 Type 2 diabetes mellitus with other diabetic neurological complication: Secondary | ICD-10-CM | POA: Diagnosis not present

## 2012-07-08 DIAGNOSIS — E1142 Type 2 diabetes mellitus with diabetic polyneuropathy: Secondary | ICD-10-CM | POA: Diagnosis not present

## 2012-07-08 DIAGNOSIS — Z471 Aftercare following joint replacement surgery: Secondary | ICD-10-CM | POA: Diagnosis not present

## 2012-07-08 DIAGNOSIS — Z96649 Presence of unspecified artificial hip joint: Secondary | ICD-10-CM | POA: Diagnosis not present

## 2012-07-08 DIAGNOSIS — F329 Major depressive disorder, single episode, unspecified: Secondary | ICD-10-CM | POA: Diagnosis not present

## 2012-07-09 DIAGNOSIS — F329 Major depressive disorder, single episode, unspecified: Secondary | ICD-10-CM | POA: Diagnosis not present

## 2012-07-09 DIAGNOSIS — Z471 Aftercare following joint replacement surgery: Secondary | ICD-10-CM | POA: Diagnosis not present

## 2012-07-09 DIAGNOSIS — E1142 Type 2 diabetes mellitus with diabetic polyneuropathy: Secondary | ICD-10-CM | POA: Diagnosis not present

## 2012-07-09 DIAGNOSIS — Z96649 Presence of unspecified artificial hip joint: Secondary | ICD-10-CM | POA: Diagnosis not present

## 2012-07-09 DIAGNOSIS — E1149 Type 2 diabetes mellitus with other diabetic neurological complication: Secondary | ICD-10-CM | POA: Diagnosis not present

## 2012-07-10 DIAGNOSIS — E1149 Type 2 diabetes mellitus with other diabetic neurological complication: Secondary | ICD-10-CM | POA: Diagnosis not present

## 2012-07-10 DIAGNOSIS — E1142 Type 2 diabetes mellitus with diabetic polyneuropathy: Secondary | ICD-10-CM | POA: Diagnosis not present

## 2012-07-10 DIAGNOSIS — Z96649 Presence of unspecified artificial hip joint: Secondary | ICD-10-CM | POA: Diagnosis not present

## 2012-07-10 DIAGNOSIS — F329 Major depressive disorder, single episode, unspecified: Secondary | ICD-10-CM | POA: Diagnosis not present

## 2012-07-10 DIAGNOSIS — Z471 Aftercare following joint replacement surgery: Secondary | ICD-10-CM | POA: Diagnosis not present

## 2012-07-14 DIAGNOSIS — M069 Rheumatoid arthritis, unspecified: Secondary | ICD-10-CM | POA: Diagnosis not present

## 2012-07-21 DIAGNOSIS — J209 Acute bronchitis, unspecified: Secondary | ICD-10-CM | POA: Diagnosis not present

## 2012-07-28 DIAGNOSIS — J449 Chronic obstructive pulmonary disease, unspecified: Secondary | ICD-10-CM | POA: Diagnosis not present

## 2012-07-29 DIAGNOSIS — M25559 Pain in unspecified hip: Secondary | ICD-10-CM | POA: Diagnosis not present

## 2012-08-04 DIAGNOSIS — Z23 Encounter for immunization: Secondary | ICD-10-CM | POA: Diagnosis not present

## 2012-08-04 DIAGNOSIS — D518 Other vitamin B12 deficiency anemias: Secondary | ICD-10-CM | POA: Diagnosis not present

## 2012-08-04 DIAGNOSIS — E782 Mixed hyperlipidemia: Secondary | ICD-10-CM | POA: Diagnosis not present

## 2012-08-04 DIAGNOSIS — I10 Essential (primary) hypertension: Secondary | ICD-10-CM | POA: Diagnosis not present

## 2012-08-04 DIAGNOSIS — E1149 Type 2 diabetes mellitus with other diabetic neurological complication: Secondary | ICD-10-CM | POA: Diagnosis not present

## 2012-08-26 DIAGNOSIS — M069 Rheumatoid arthritis, unspecified: Secondary | ICD-10-CM | POA: Diagnosis not present

## 2012-09-03 DIAGNOSIS — N3941 Urge incontinence: Secondary | ICD-10-CM | POA: Diagnosis not present

## 2012-09-03 DIAGNOSIS — R39198 Other difficulties with micturition: Secondary | ICD-10-CM | POA: Diagnosis not present

## 2012-09-03 DIAGNOSIS — R339 Retention of urine, unspecified: Secondary | ICD-10-CM | POA: Diagnosis not present

## 2012-09-25 DIAGNOSIS — M069 Rheumatoid arthritis, unspecified: Secondary | ICD-10-CM | POA: Diagnosis not present

## 2012-09-30 DIAGNOSIS — N3942 Incontinence without sensory awareness: Secondary | ICD-10-CM | POA: Diagnosis not present

## 2012-09-30 DIAGNOSIS — N3946 Mixed incontinence: Secondary | ICD-10-CM | POA: Diagnosis not present

## 2012-09-30 DIAGNOSIS — R350 Frequency of micturition: Secondary | ICD-10-CM | POA: Diagnosis not present

## 2012-10-14 DIAGNOSIS — E1149 Type 2 diabetes mellitus with other diabetic neurological complication: Secondary | ICD-10-CM | POA: Diagnosis not present

## 2012-10-14 DIAGNOSIS — J441 Chronic obstructive pulmonary disease with (acute) exacerbation: Secondary | ICD-10-CM | POA: Diagnosis not present

## 2012-10-20 DIAGNOSIS — M79609 Pain in unspecified limb: Secondary | ICD-10-CM | POA: Diagnosis not present

## 2012-10-20 DIAGNOSIS — IMO0001 Reserved for inherently not codable concepts without codable children: Secondary | ICD-10-CM | POA: Diagnosis not present

## 2012-10-20 DIAGNOSIS — M069 Rheumatoid arthritis, unspecified: Secondary | ICD-10-CM | POA: Diagnosis not present

## 2012-10-20 DIAGNOSIS — M159 Polyosteoarthritis, unspecified: Secondary | ICD-10-CM | POA: Diagnosis not present

## 2012-11-04 DIAGNOSIS — M069 Rheumatoid arthritis, unspecified: Secondary | ICD-10-CM | POA: Diagnosis not present

## 2012-11-06 DIAGNOSIS — E1149 Type 2 diabetes mellitus with other diabetic neurological complication: Secondary | ICD-10-CM | POA: Diagnosis not present

## 2012-11-06 DIAGNOSIS — D518 Other vitamin B12 deficiency anemias: Secondary | ICD-10-CM | POA: Diagnosis not present

## 2012-11-06 DIAGNOSIS — I10 Essential (primary) hypertension: Secondary | ICD-10-CM | POA: Diagnosis not present

## 2012-11-06 DIAGNOSIS — E782 Mixed hyperlipidemia: Secondary | ICD-10-CM | POA: Diagnosis not present

## 2012-11-11 DIAGNOSIS — N3941 Urge incontinence: Secondary | ICD-10-CM | POA: Diagnosis not present

## 2012-11-11 DIAGNOSIS — N3946 Mixed incontinence: Secondary | ICD-10-CM | POA: Diagnosis not present

## 2012-11-13 DIAGNOSIS — S058X9A Other injuries of unspecified eye and orbit, initial encounter: Secondary | ICD-10-CM | POA: Diagnosis not present

## 2012-11-17 DIAGNOSIS — S058X9A Other injuries of unspecified eye and orbit, initial encounter: Secondary | ICD-10-CM | POA: Diagnosis not present

## 2012-11-18 DIAGNOSIS — M2010 Hallux valgus (acquired), unspecified foot: Secondary | ICD-10-CM | POA: Diagnosis not present

## 2012-11-18 DIAGNOSIS — M79609 Pain in unspecified limb: Secondary | ICD-10-CM | POA: Diagnosis not present

## 2012-11-18 DIAGNOSIS — M069 Rheumatoid arthritis, unspecified: Secondary | ICD-10-CM | POA: Diagnosis not present

## 2012-11-27 DIAGNOSIS — M48061 Spinal stenosis, lumbar region without neurogenic claudication: Secondary | ICD-10-CM | POA: Diagnosis not present

## 2012-11-27 DIAGNOSIS — M4310 Spondylolisthesis, site unspecified: Secondary | ICD-10-CM | POA: Diagnosis not present

## 2012-12-02 DIAGNOSIS — M069 Rheumatoid arthritis, unspecified: Secondary | ICD-10-CM | POA: Diagnosis not present

## 2012-12-09 DIAGNOSIS — D518 Other vitamin B12 deficiency anemias: Secondary | ICD-10-CM | POA: Diagnosis not present

## 2012-12-12 DIAGNOSIS — N3941 Urge incontinence: Secondary | ICD-10-CM | POA: Diagnosis not present

## 2012-12-12 DIAGNOSIS — N3946 Mixed incontinence: Secondary | ICD-10-CM | POA: Diagnosis not present

## 2013-01-01 DIAGNOSIS — M069 Rheumatoid arthritis, unspecified: Secondary | ICD-10-CM | POA: Diagnosis not present

## 2013-01-19 DIAGNOSIS — M159 Polyosteoarthritis, unspecified: Secondary | ICD-10-CM | POA: Diagnosis not present

## 2013-01-19 DIAGNOSIS — M069 Rheumatoid arthritis, unspecified: Secondary | ICD-10-CM | POA: Diagnosis not present

## 2013-01-29 DIAGNOSIS — M069 Rheumatoid arthritis, unspecified: Secondary | ICD-10-CM | POA: Diagnosis not present

## 2013-02-04 DIAGNOSIS — E1149 Type 2 diabetes mellitus with other diabetic neurological complication: Secondary | ICD-10-CM | POA: Diagnosis not present

## 2013-02-04 DIAGNOSIS — E782 Mixed hyperlipidemia: Secondary | ICD-10-CM | POA: Diagnosis not present

## 2013-02-04 DIAGNOSIS — I10 Essential (primary) hypertension: Secondary | ICD-10-CM | POA: Diagnosis not present

## 2013-02-04 DIAGNOSIS — D518 Other vitamin B12 deficiency anemias: Secondary | ICD-10-CM | POA: Diagnosis not present

## 2013-02-10 DIAGNOSIS — M79609 Pain in unspecified limb: Secondary | ICD-10-CM | POA: Diagnosis not present

## 2013-02-10 DIAGNOSIS — L600 Ingrowing nail: Secondary | ICD-10-CM | POA: Diagnosis not present

## 2013-02-27 DIAGNOSIS — M069 Rheumatoid arthritis, unspecified: Secondary | ICD-10-CM | POA: Diagnosis not present

## 2013-03-03 DIAGNOSIS — M7710 Lateral epicondylitis, unspecified elbow: Secondary | ICD-10-CM | POA: Diagnosis not present

## 2013-03-03 DIAGNOSIS — L03039 Cellulitis of unspecified toe: Secondary | ICD-10-CM | POA: Diagnosis not present

## 2013-03-16 DIAGNOSIS — N3020 Other chronic cystitis without hematuria: Secondary | ICD-10-CM | POA: Diagnosis not present

## 2013-03-16 DIAGNOSIS — N3946 Mixed incontinence: Secondary | ICD-10-CM | POA: Diagnosis not present

## 2013-03-27 DIAGNOSIS — M069 Rheumatoid arthritis, unspecified: Secondary | ICD-10-CM | POA: Diagnosis not present

## 2013-04-09 DIAGNOSIS — H532 Diplopia: Secondary | ICD-10-CM | POA: Diagnosis not present

## 2013-04-09 DIAGNOSIS — E119 Type 2 diabetes mellitus without complications: Secondary | ICD-10-CM | POA: Diagnosis not present

## 2013-04-09 DIAGNOSIS — H524 Presbyopia: Secondary | ICD-10-CM | POA: Diagnosis not present

## 2013-04-09 DIAGNOSIS — H35039 Hypertensive retinopathy, unspecified eye: Secondary | ICD-10-CM | POA: Diagnosis not present

## 2013-04-21 DIAGNOSIS — Z1389 Encounter for screening for other disorder: Secondary | ICD-10-CM | POA: Diagnosis not present

## 2013-04-21 DIAGNOSIS — Z6831 Body mass index (BMI) 31.0-31.9, adult: Secondary | ICD-10-CM | POA: Diagnosis not present

## 2013-04-21 DIAGNOSIS — R05 Cough: Secondary | ICD-10-CM | POA: Diagnosis not present

## 2013-04-21 DIAGNOSIS — D518 Other vitamin B12 deficiency anemias: Secondary | ICD-10-CM | POA: Diagnosis not present

## 2013-04-24 DIAGNOSIS — M069 Rheumatoid arthritis, unspecified: Secondary | ICD-10-CM | POA: Diagnosis not present

## 2013-05-05 DIAGNOSIS — M21619 Bunion of unspecified foot: Secondary | ICD-10-CM | POA: Diagnosis not present

## 2013-05-21 DIAGNOSIS — M159 Polyosteoarthritis, unspecified: Secondary | ICD-10-CM | POA: Diagnosis not present

## 2013-05-21 DIAGNOSIS — IMO0001 Reserved for inherently not codable concepts without codable children: Secondary | ICD-10-CM | POA: Diagnosis not present

## 2013-05-21 DIAGNOSIS — M069 Rheumatoid arthritis, unspecified: Secondary | ICD-10-CM | POA: Diagnosis not present

## 2013-05-22 DIAGNOSIS — M069 Rheumatoid arthritis, unspecified: Secondary | ICD-10-CM | POA: Diagnosis not present

## 2013-05-26 DIAGNOSIS — G8918 Other acute postprocedural pain: Secondary | ICD-10-CM | POA: Diagnosis not present

## 2013-05-26 DIAGNOSIS — M2020 Hallux rigidus, unspecified foot: Secondary | ICD-10-CM | POA: Diagnosis not present

## 2013-05-26 DIAGNOSIS — M069 Rheumatoid arthritis, unspecified: Secondary | ICD-10-CM | POA: Diagnosis not present

## 2013-05-26 DIAGNOSIS — M2010 Hallux valgus (acquired), unspecified foot: Secondary | ICD-10-CM | POA: Diagnosis not present

## 2013-06-05 DIAGNOSIS — E1149 Type 2 diabetes mellitus with other diabetic neurological complication: Secondary | ICD-10-CM | POA: Diagnosis not present

## 2013-06-05 DIAGNOSIS — I10 Essential (primary) hypertension: Secondary | ICD-10-CM | POA: Diagnosis not present

## 2013-06-05 DIAGNOSIS — Z1331 Encounter for screening for depression: Secondary | ICD-10-CM | POA: Diagnosis not present

## 2013-06-05 DIAGNOSIS — Z139 Encounter for screening, unspecified: Secondary | ICD-10-CM | POA: Diagnosis not present

## 2013-06-05 DIAGNOSIS — E782 Mixed hyperlipidemia: Secondary | ICD-10-CM | POA: Diagnosis not present

## 2013-06-05 DIAGNOSIS — D518 Other vitamin B12 deficiency anemias: Secondary | ICD-10-CM | POA: Diagnosis not present

## 2013-06-05 DIAGNOSIS — R269 Unspecified abnormalities of gait and mobility: Secondary | ICD-10-CM | POA: Diagnosis not present

## 2013-06-10 DIAGNOSIS — J45901 Unspecified asthma with (acute) exacerbation: Secondary | ICD-10-CM | POA: Diagnosis not present

## 2013-06-11 DIAGNOSIS — I10 Essential (primary) hypertension: Secondary | ICD-10-CM | POA: Diagnosis not present

## 2013-06-11 DIAGNOSIS — R279 Unspecified lack of coordination: Secondary | ICD-10-CM | POA: Diagnosis not present

## 2013-06-11 DIAGNOSIS — E1149 Type 2 diabetes mellitus with other diabetic neurological complication: Secondary | ICD-10-CM | POA: Diagnosis not present

## 2013-06-11 DIAGNOSIS — E1142 Type 2 diabetes mellitus with diabetic polyneuropathy: Secondary | ICD-10-CM | POA: Diagnosis not present

## 2013-06-11 DIAGNOSIS — IMO0001 Reserved for inherently not codable concepts without codable children: Secondary | ICD-10-CM | POA: Diagnosis not present

## 2013-06-15 DIAGNOSIS — E1142 Type 2 diabetes mellitus with diabetic polyneuropathy: Secondary | ICD-10-CM | POA: Diagnosis not present

## 2013-06-15 DIAGNOSIS — R279 Unspecified lack of coordination: Secondary | ICD-10-CM | POA: Diagnosis not present

## 2013-06-15 DIAGNOSIS — I10 Essential (primary) hypertension: Secondary | ICD-10-CM | POA: Diagnosis not present

## 2013-06-15 DIAGNOSIS — E1149 Type 2 diabetes mellitus with other diabetic neurological complication: Secondary | ICD-10-CM | POA: Diagnosis not present

## 2013-06-15 DIAGNOSIS — IMO0001 Reserved for inherently not codable concepts without codable children: Secondary | ICD-10-CM | POA: Diagnosis not present

## 2013-06-16 DIAGNOSIS — E1149 Type 2 diabetes mellitus with other diabetic neurological complication: Secondary | ICD-10-CM | POA: Diagnosis not present

## 2013-06-16 DIAGNOSIS — R279 Unspecified lack of coordination: Secondary | ICD-10-CM | POA: Diagnosis not present

## 2013-06-16 DIAGNOSIS — E1142 Type 2 diabetes mellitus with diabetic polyneuropathy: Secondary | ICD-10-CM | POA: Diagnosis not present

## 2013-06-16 DIAGNOSIS — IMO0001 Reserved for inherently not codable concepts without codable children: Secondary | ICD-10-CM | POA: Diagnosis not present

## 2013-06-16 DIAGNOSIS — I10 Essential (primary) hypertension: Secondary | ICD-10-CM | POA: Diagnosis not present

## 2013-06-17 DIAGNOSIS — E1149 Type 2 diabetes mellitus with other diabetic neurological complication: Secondary | ICD-10-CM | POA: Diagnosis not present

## 2013-06-17 DIAGNOSIS — Z1389 Encounter for screening for other disorder: Secondary | ICD-10-CM | POA: Diagnosis not present

## 2013-06-17 DIAGNOSIS — I10 Essential (primary) hypertension: Secondary | ICD-10-CM | POA: Diagnosis not present

## 2013-06-17 DIAGNOSIS — E782 Mixed hyperlipidemia: Secondary | ICD-10-CM | POA: Diagnosis not present

## 2013-06-18 DIAGNOSIS — I10 Essential (primary) hypertension: Secondary | ICD-10-CM | POA: Diagnosis not present

## 2013-06-18 DIAGNOSIS — IMO0001 Reserved for inherently not codable concepts without codable children: Secondary | ICD-10-CM | POA: Diagnosis not present

## 2013-06-18 DIAGNOSIS — E1142 Type 2 diabetes mellitus with diabetic polyneuropathy: Secondary | ICD-10-CM | POA: Diagnosis not present

## 2013-06-18 DIAGNOSIS — E1149 Type 2 diabetes mellitus with other diabetic neurological complication: Secondary | ICD-10-CM | POA: Diagnosis not present

## 2013-06-18 DIAGNOSIS — R279 Unspecified lack of coordination: Secondary | ICD-10-CM | POA: Diagnosis not present

## 2013-06-19 DIAGNOSIS — M069 Rheumatoid arthritis, unspecified: Secondary | ICD-10-CM | POA: Diagnosis not present

## 2013-06-22 DIAGNOSIS — R279 Unspecified lack of coordination: Secondary | ICD-10-CM | POA: Diagnosis not present

## 2013-06-22 DIAGNOSIS — E1149 Type 2 diabetes mellitus with other diabetic neurological complication: Secondary | ICD-10-CM | POA: Diagnosis not present

## 2013-06-22 DIAGNOSIS — E1142 Type 2 diabetes mellitus with diabetic polyneuropathy: Secondary | ICD-10-CM | POA: Diagnosis not present

## 2013-06-22 DIAGNOSIS — I10 Essential (primary) hypertension: Secondary | ICD-10-CM | POA: Diagnosis not present

## 2013-06-22 DIAGNOSIS — IMO0001 Reserved for inherently not codable concepts without codable children: Secondary | ICD-10-CM | POA: Diagnosis not present

## 2013-06-23 DIAGNOSIS — M21619 Bunion of unspecified foot: Secondary | ICD-10-CM | POA: Diagnosis not present

## 2013-06-23 DIAGNOSIS — M2020 Hallux rigidus, unspecified foot: Secondary | ICD-10-CM | POA: Diagnosis not present

## 2013-06-23 DIAGNOSIS — E1149 Type 2 diabetes mellitus with other diabetic neurological complication: Secondary | ICD-10-CM | POA: Diagnosis not present

## 2013-06-23 DIAGNOSIS — I10 Essential (primary) hypertension: Secondary | ICD-10-CM | POA: Diagnosis not present

## 2013-06-23 DIAGNOSIS — IMO0001 Reserved for inherently not codable concepts without codable children: Secondary | ICD-10-CM | POA: Diagnosis not present

## 2013-06-23 DIAGNOSIS — R279 Unspecified lack of coordination: Secondary | ICD-10-CM | POA: Diagnosis not present

## 2013-06-23 DIAGNOSIS — E1142 Type 2 diabetes mellitus with diabetic polyneuropathy: Secondary | ICD-10-CM | POA: Diagnosis not present

## 2013-06-24 DIAGNOSIS — E1142 Type 2 diabetes mellitus with diabetic polyneuropathy: Secondary | ICD-10-CM | POA: Diagnosis not present

## 2013-06-24 DIAGNOSIS — R279 Unspecified lack of coordination: Secondary | ICD-10-CM | POA: Diagnosis not present

## 2013-06-24 DIAGNOSIS — E1149 Type 2 diabetes mellitus with other diabetic neurological complication: Secondary | ICD-10-CM | POA: Diagnosis not present

## 2013-06-24 DIAGNOSIS — IMO0001 Reserved for inherently not codable concepts without codable children: Secondary | ICD-10-CM | POA: Diagnosis not present

## 2013-06-24 DIAGNOSIS — I10 Essential (primary) hypertension: Secondary | ICD-10-CM | POA: Diagnosis not present

## 2013-07-07 DIAGNOSIS — I10 Essential (primary) hypertension: Secondary | ICD-10-CM | POA: Diagnosis not present

## 2013-07-07 DIAGNOSIS — E1142 Type 2 diabetes mellitus with diabetic polyneuropathy: Secondary | ICD-10-CM | POA: Diagnosis not present

## 2013-07-07 DIAGNOSIS — E1149 Type 2 diabetes mellitus with other diabetic neurological complication: Secondary | ICD-10-CM | POA: Diagnosis not present

## 2013-07-07 DIAGNOSIS — IMO0001 Reserved for inherently not codable concepts without codable children: Secondary | ICD-10-CM | POA: Diagnosis not present

## 2013-07-07 DIAGNOSIS — R279 Unspecified lack of coordination: Secondary | ICD-10-CM | POA: Diagnosis not present

## 2013-07-08 DIAGNOSIS — R279 Unspecified lack of coordination: Secondary | ICD-10-CM | POA: Diagnosis not present

## 2013-07-08 DIAGNOSIS — Z1231 Encounter for screening mammogram for malignant neoplasm of breast: Secondary | ICD-10-CM | POA: Diagnosis not present

## 2013-07-08 DIAGNOSIS — I10 Essential (primary) hypertension: Secondary | ICD-10-CM | POA: Diagnosis not present

## 2013-07-08 DIAGNOSIS — IMO0001 Reserved for inherently not codable concepts without codable children: Secondary | ICD-10-CM | POA: Diagnosis not present

## 2013-07-08 DIAGNOSIS — E1149 Type 2 diabetes mellitus with other diabetic neurological complication: Secondary | ICD-10-CM | POA: Diagnosis not present

## 2013-07-08 DIAGNOSIS — M899 Disorder of bone, unspecified: Secondary | ICD-10-CM | POA: Diagnosis not present

## 2013-07-08 DIAGNOSIS — E1142 Type 2 diabetes mellitus with diabetic polyneuropathy: Secondary | ICD-10-CM | POA: Diagnosis not present

## 2013-07-08 DIAGNOSIS — Z13820 Encounter for screening for osteoporosis: Secondary | ICD-10-CM | POA: Diagnosis not present

## 2013-07-14 DIAGNOSIS — IMO0001 Reserved for inherently not codable concepts without codable children: Secondary | ICD-10-CM | POA: Diagnosis not present

## 2013-07-14 DIAGNOSIS — R279 Unspecified lack of coordination: Secondary | ICD-10-CM | POA: Diagnosis not present

## 2013-07-14 DIAGNOSIS — E1142 Type 2 diabetes mellitus with diabetic polyneuropathy: Secondary | ICD-10-CM | POA: Diagnosis not present

## 2013-07-14 DIAGNOSIS — I10 Essential (primary) hypertension: Secondary | ICD-10-CM | POA: Diagnosis not present

## 2013-07-14 DIAGNOSIS — E1149 Type 2 diabetes mellitus with other diabetic neurological complication: Secondary | ICD-10-CM | POA: Diagnosis not present

## 2013-07-15 DIAGNOSIS — D518 Other vitamin B12 deficiency anemias: Secondary | ICD-10-CM | POA: Diagnosis not present

## 2013-07-15 DIAGNOSIS — R928 Other abnormal and inconclusive findings on diagnostic imaging of breast: Secondary | ICD-10-CM | POA: Diagnosis not present

## 2013-07-16 DIAGNOSIS — M069 Rheumatoid arthritis, unspecified: Secondary | ICD-10-CM | POA: Diagnosis not present

## 2013-07-17 DIAGNOSIS — E1142 Type 2 diabetes mellitus with diabetic polyneuropathy: Secondary | ICD-10-CM | POA: Diagnosis not present

## 2013-07-17 DIAGNOSIS — IMO0001 Reserved for inherently not codable concepts without codable children: Secondary | ICD-10-CM | POA: Diagnosis not present

## 2013-07-17 DIAGNOSIS — I10 Essential (primary) hypertension: Secondary | ICD-10-CM | POA: Diagnosis not present

## 2013-07-17 DIAGNOSIS — R279 Unspecified lack of coordination: Secondary | ICD-10-CM | POA: Diagnosis not present

## 2013-07-17 DIAGNOSIS — E1149 Type 2 diabetes mellitus with other diabetic neurological complication: Secondary | ICD-10-CM | POA: Diagnosis not present

## 2013-07-29 DIAGNOSIS — M169 Osteoarthritis of hip, unspecified: Secondary | ICD-10-CM | POA: Diagnosis not present

## 2013-07-29 DIAGNOSIS — M4310 Spondylolisthesis, site unspecified: Secondary | ICD-10-CM | POA: Diagnosis not present

## 2013-07-29 DIAGNOSIS — M25559 Pain in unspecified hip: Secondary | ICD-10-CM | POA: Diagnosis not present

## 2013-08-06 DIAGNOSIS — Z23 Encounter for immunization: Secondary | ICD-10-CM | POA: Diagnosis not present

## 2013-08-14 DIAGNOSIS — M069 Rheumatoid arthritis, unspecified: Secondary | ICD-10-CM | POA: Diagnosis not present

## 2013-08-24 DIAGNOSIS — R05 Cough: Secondary | ICD-10-CM | POA: Diagnosis not present

## 2013-08-24 DIAGNOSIS — Z1389 Encounter for screening for other disorder: Secondary | ICD-10-CM | POA: Diagnosis not present

## 2013-08-24 DIAGNOSIS — D518 Other vitamin B12 deficiency anemias: Secondary | ICD-10-CM | POA: Diagnosis not present

## 2013-08-24 DIAGNOSIS — Z6833 Body mass index (BMI) 33.0-33.9, adult: Secondary | ICD-10-CM | POA: Diagnosis not present

## 2013-09-07 DIAGNOSIS — M069 Rheumatoid arthritis, unspecified: Secondary | ICD-10-CM | POA: Diagnosis not present

## 2013-09-21 DIAGNOSIS — IMO0001 Reserved for inherently not codable concepts without codable children: Secondary | ICD-10-CM | POA: Diagnosis not present

## 2013-09-21 DIAGNOSIS — M069 Rheumatoid arthritis, unspecified: Secondary | ICD-10-CM | POA: Diagnosis not present

## 2013-09-21 DIAGNOSIS — M159 Polyosteoarthritis, unspecified: Secondary | ICD-10-CM | POA: Diagnosis not present

## 2013-09-21 DIAGNOSIS — M6530 Trigger finger, unspecified finger: Secondary | ICD-10-CM | POA: Diagnosis not present

## 2013-10-06 DIAGNOSIS — E119 Type 2 diabetes mellitus without complications: Secondary | ICD-10-CM | POA: Diagnosis not present

## 2013-10-06 DIAGNOSIS — Z79899 Other long term (current) drug therapy: Secondary | ICD-10-CM | POA: Diagnosis not present

## 2013-10-08 DIAGNOSIS — M069 Rheumatoid arthritis, unspecified: Secondary | ICD-10-CM | POA: Diagnosis not present

## 2013-10-12 DIAGNOSIS — S61209A Unspecified open wound of unspecified finger without damage to nail, initial encounter: Secondary | ICD-10-CM | POA: Diagnosis not present

## 2013-10-14 DIAGNOSIS — I10 Essential (primary) hypertension: Secondary | ICD-10-CM | POA: Diagnosis not present

## 2013-10-14 DIAGNOSIS — D518 Other vitamin B12 deficiency anemias: Secondary | ICD-10-CM | POA: Diagnosis not present

## 2013-10-14 DIAGNOSIS — E1149 Type 2 diabetes mellitus with other diabetic neurological complication: Secondary | ICD-10-CM | POA: Diagnosis not present

## 2013-10-14 DIAGNOSIS — Z1389 Encounter for screening for other disorder: Secondary | ICD-10-CM | POA: Diagnosis not present

## 2013-10-14 DIAGNOSIS — E782 Mixed hyperlipidemia: Secondary | ICD-10-CM | POA: Diagnosis not present

## 2013-10-14 DIAGNOSIS — D649 Anemia, unspecified: Secondary | ICD-10-CM | POA: Diagnosis not present

## 2013-10-21 DIAGNOSIS — M720 Palmar fascial fibromatosis [Dupuytren]: Secondary | ICD-10-CM | POA: Diagnosis not present

## 2013-10-21 DIAGNOSIS — Q740 Other congenital malformations of upper limb(s), including shoulder girdle: Secondary | ICD-10-CM | POA: Diagnosis not present

## 2013-10-21 DIAGNOSIS — M24849 Other specific joint derangements of unspecified hand, not elsewhere classified: Secondary | ICD-10-CM | POA: Diagnosis not present

## 2013-10-27 ENCOUNTER — Other Ambulatory Visit: Payer: Self-pay | Admitting: Orthopedic Surgery

## 2013-10-27 DIAGNOSIS — E119 Type 2 diabetes mellitus without complications: Secondary | ICD-10-CM | POA: Diagnosis not present

## 2013-10-27 DIAGNOSIS — Z09 Encounter for follow-up examination after completed treatment for conditions other than malignant neoplasm: Secondary | ICD-10-CM | POA: Diagnosis not present

## 2013-10-27 DIAGNOSIS — M069 Rheumatoid arthritis, unspecified: Secondary | ICD-10-CM | POA: Diagnosis not present

## 2013-10-27 DIAGNOSIS — D500 Iron deficiency anemia secondary to blood loss (chronic): Secondary | ICD-10-CM | POA: Diagnosis not present

## 2013-10-27 DIAGNOSIS — D638 Anemia in other chronic diseases classified elsewhere: Secondary | ICD-10-CM | POA: Diagnosis not present

## 2013-11-04 DIAGNOSIS — D649 Anemia, unspecified: Secondary | ICD-10-CM | POA: Diagnosis not present

## 2013-11-09 ENCOUNTER — Encounter (HOSPITAL_BASED_OUTPATIENT_CLINIC_OR_DEPARTMENT_OTHER): Payer: Self-pay | Admitting: *Deleted

## 2013-11-09 DIAGNOSIS — M069 Rheumatoid arthritis, unspecified: Secondary | ICD-10-CM | POA: Diagnosis not present

## 2013-11-09 NOTE — Progress Notes (Signed)
willcall for labs will need ekg-to bring all meds  Used to use cpap-does not use it now.

## 2013-11-11 NOTE — H&P (Signed)
Gloria Best is an 73 y.o. female.   Chief Complaint: c/o inability to extend left long finger HPI: .  Gloria Best is a 73 year-old woman who has been ambulatory with a walker for the past year.  She is accompanied by her daughter during our consult.  She has noted the inability to extend her left long finger after holding her walker. She has ulnar deviation and a trapping of her finger in flexion consistent with a rupture of the radial sagittal fibers of her extensor mechanism.    Past Medical History  Diagnosis Date  . Complication of anesthesia     "some difficulty waking up"  . Dysrhythmia     takes cardizem, sees Dr. Marco Collie primary  . Heart murmur   . Depression     takes medication for   . Asthma     hx of as child  . Pneumonia     hx of  . Bronchitis     hx of  . Diabetes mellitus     oral medication  . Bladder disorder     having difficulty voiding, requires in and out catheter 2x day and prn  . Urinary tract infection     hx of  . H/O hiatal hernia     hx of   . GERD (gastroesophageal reflux disease)   . Carpal tunnel syndrome of left wrist     hx of  . Arthritis     RA  . Fibromyalgia     takes lyrica  . Anemia     hx of blood transfusion  . Chronic back pain   . Cataracts, bilateral     hx of  . Hypertension   . Shortness of breath   . Sleep apnea   . Wears dentures     top    Past Surgical History  Procedure Laterality Date  . Cataract extraction      bilateral  . Back surgery       x 3  . Abdominal hysterectomy    . Hernia repair    . Cesarean section    . Colon surgery      "due to large polyp"  . Foot surgery    . Carpal tunnel release      left side only  . Appendectomy    . Hip arthroplasty  06/09/2012  . Total hip arthroplasty  06/09/2012    Procedure: TOTAL HIP ARTHROPLASTY;  Surgeon: [name], MD;  Location: [place];  Service: Orthopedics;  Laterality: Right;  Right total hip replacement    History reviewed. No pertinent  family history. Social History:  reports that she quit smoking about 29 years ago. She has never used smokeless tobacco. She reports that she does not drink alcohol or use illicit drugs.  Allergies:  Allergies  Allergen Reactions  . Adhesive [Tape] Other (See Comments)    Tears skin  . Lotrel [Amlodipine Besy-Benazepril Hcl] Itching and Cough    No prescriptions prior to admission    No results found for this or any previous visit (from the past 48 hour(s)).  No results found.   Pertinent items are noted in HPI.  Height 5' (1.524 m), weight 76.204 kg (168 lb).  General appearance: alert Head: Normocephalic, without obvious abnormality Neck: supple, symmetrical, trachea midline Resp: clear to auscultation bilaterally Cardio: regular rate and rhythm GI: normal findings: bowel sounds normal Extremities: Inspection of her hands reveals a 45 degree extensor lag and 30 degrees  of ulnar deviation of her left long finger due to rupture of the radial sagittal fibers of her extensor mechanism.   We can passively correct her MP to neutral and she can hold it in this position once it is corrected.    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  She also has Dupuytren's contracture of her long and ring finger pretendinous fibers on the right.   X-rays demonstrate background osteoarthritis consistent with chronological age.  She has significant joint space narrowing at her index and long finger MP joints and subluxation consistent with an inflammatory arthropathy Pulses: 2+ and symmetric Skin: mobility and turgor normal Neurologic: Grossly normal    Assessment/Plan Impression: Rupture of radial sagittal fibers left long finger  Plan: To the OR for tendon graft reconstruction of the left long finger saggital fibers.The procedure, risks,benefits and post-op course were discussed with the patient at length and  they were in agreement with the plan.  DASNOIT,Gloria Best 11/11/2013, 10:01 AM   H&P documentation: 11/12/2013  -History and Physical Reviewed  -Patient has been re-examined  -No change in the plan of care  Cammie Sickle, MD

## 2013-11-12 ENCOUNTER — Ambulatory Visit (HOSPITAL_BASED_OUTPATIENT_CLINIC_OR_DEPARTMENT_OTHER): Payer: Medicare Other | Admitting: Anesthesiology

## 2013-11-12 ENCOUNTER — Encounter (HOSPITAL_BASED_OUTPATIENT_CLINIC_OR_DEPARTMENT_OTHER)
Admission: RE | Disposition: A | Discharge status: Home / Self Care | Payer: Self-pay | Source: Ambulatory Visit | Attending: Orthopedic Surgery

## 2013-11-12 ENCOUNTER — Ambulatory Visit (HOSPITAL_BASED_OUTPATIENT_CLINIC_OR_DEPARTMENT_OTHER)
Admission: RE | Admit: 2013-11-12 | Discharge: 2013-11-12 | Disposition: A | Discharge status: Home / Self Care | Payer: Medicare Other | Source: Ambulatory Visit | Attending: Orthopedic Surgery | Admitting: Orthopedic Surgery

## 2013-11-12 ENCOUNTER — Encounter (HOSPITAL_BASED_OUTPATIENT_CLINIC_OR_DEPARTMENT_OTHER): Payer: Self-pay | Admitting: *Deleted

## 2013-11-12 ENCOUNTER — Encounter (HOSPITAL_BASED_OUTPATIENT_CLINIC_OR_DEPARTMENT_OTHER): Payer: Medicare Other | Admitting: Anesthesiology

## 2013-11-12 DIAGNOSIS — Z7901 Long term (current) use of anticoagulants: Secondary | ICD-10-CM | POA: Insufficient documentation

## 2013-11-12 DIAGNOSIS — M21839 Other specified acquired deformities of unspecified forearm: Secondary | ICD-10-CM | POA: Insufficient documentation

## 2013-11-12 DIAGNOSIS — M720 Palmar fascial fibromatosis [Dupuytren]: Secondary | ICD-10-CM | POA: Insufficient documentation

## 2013-11-12 DIAGNOSIS — M069 Rheumatoid arthritis, unspecified: Secondary | ICD-10-CM | POA: Insufficient documentation

## 2013-11-12 DIAGNOSIS — M25549 Pain in joints of unspecified hand: Secondary | ICD-10-CM | POA: Diagnosis not present

## 2013-11-12 DIAGNOSIS — K219 Gastro-esophageal reflux disease without esophagitis: Secondary | ICD-10-CM | POA: Diagnosis not present

## 2013-11-12 DIAGNOSIS — I10 Essential (primary) hypertension: Secondary | ICD-10-CM | POA: Diagnosis not present

## 2013-11-12 DIAGNOSIS — E119 Type 2 diabetes mellitus without complications: Secondary | ICD-10-CM | POA: Diagnosis not present

## 2013-11-12 DIAGNOSIS — G8918 Other acute postprocedural pain: Secondary | ICD-10-CM | POA: Diagnosis not present

## 2013-11-12 DIAGNOSIS — Z87891 Personal history of nicotine dependence: Secondary | ICD-10-CM | POA: Insufficient documentation

## 2013-11-12 DIAGNOSIS — S6390XA Sprain of unspecified part of unspecified wrist and hand, initial encounter: Secondary | ICD-10-CM | POA: Diagnosis not present

## 2013-11-12 DIAGNOSIS — M66249 Spontaneous rupture of extensor tendons, unspecified hand: Secondary | ICD-10-CM | POA: Diagnosis not present

## 2013-11-12 DIAGNOSIS — J45909 Unspecified asthma, uncomplicated: Secondary | ICD-10-CM | POA: Diagnosis not present

## 2013-11-12 DIAGNOSIS — M66239 Spontaneous rupture of extensor tendons, unspecified forearm: Secondary | ICD-10-CM | POA: Diagnosis not present

## 2013-11-12 HISTORY — PX: TENOLYSIS: SHX396

## 2013-11-12 HISTORY — DX: Presence of dental prosthetic device (complete) (partial): Z97.2

## 2013-11-12 LAB — POCT I-STAT, CHEM 8
BUN: 23 mg/dL (ref 6–23)
CHLORIDE: 100 meq/L (ref 96–112)
Calcium, Ion: 1.29 mmol/L (ref 1.13–1.30)
Creatinine, Ser: 1.7 mg/dL — ABNORMAL HIGH (ref 0.50–1.10)
Glucose, Bld: 135 mg/dL — ABNORMAL HIGH (ref 70–99)
HCT: 34 % — ABNORMAL LOW (ref 36.0–46.0)
Hemoglobin: 11.6 g/dL — ABNORMAL LOW (ref 12.0–15.0)
POTASSIUM: 3.8 meq/L (ref 3.7–5.3)
Sodium: 142 mEq/L (ref 137–147)
TCO2: 27 mmol/L (ref 0–100)

## 2013-11-12 LAB — GLUCOSE, CAPILLARY: Glucose-Capillary: 134 mg/dL — ABNORMAL HIGH (ref 70–99)

## 2013-11-12 SURGERY — INCISION, TENDON SHEATH
Anesthesia: Regional | Site: Finger | Laterality: Left

## 2013-11-12 MED ORDER — LIDOCAINE-EPINEPHRINE (PF) 1.5 %-1:200000 IJ SOLN
INTRAMUSCULAR | Status: DC | PRN
  Administered 2013-11-12: 30 mL via PERINEURAL

## 2013-11-12 MED ORDER — CHLORHEXIDINE GLUCONATE 4 % EX LIQD
60.0000 mL | Freq: Once | CUTANEOUS | Status: AC
  Administered 2013-11-12: 4 via TOPICAL

## 2013-11-12 MED ORDER — FENTANYL CITRATE 0.05 MG/ML IJ SOLN: 25.0000 ug | INTRAMUSCULAR | Status: DC | PRN

## 2013-11-12 MED ORDER — PROPOFOL 10 MG/ML IV BOLUS
INTRAVENOUS | Status: AC
  Filled 2013-11-12: qty 20

## 2013-11-12 MED ORDER — CEPHALEXIN 500 MG PO CAPS: 500.0000 mg | ORAL_CAPSULE | Freq: Three times a day (TID) | ORAL | Status: DC

## 2013-11-12 MED ORDER — OXYCODONE-ACETAMINOPHEN 5-325 MG PO TABS: ORAL_TABLET | ORAL | Status: DC

## 2013-11-12 MED ORDER — PROMETHAZINE HCL 25 MG/ML IJ SOLN: 6.2500 mg | INTRAMUSCULAR | Status: DC | PRN

## 2013-11-12 MED ORDER — MIDAZOLAM HCL 2 MG/2ML IJ SOLN
INTRAMUSCULAR | Status: AC
  Filled 2013-11-12: qty 2

## 2013-11-12 MED ORDER — FENTANYL CITRATE 0.05 MG/ML IJ SOLN
INTRAMUSCULAR | Status: AC
  Filled 2013-11-12: qty 4

## 2013-11-12 MED ORDER — LACTATED RINGERS IV SOLN
INTRAVENOUS | Status: DC
  Administered 2013-11-12 (×2): via INTRAVENOUS

## 2013-11-12 MED ORDER — LIDOCAINE HCL (CARDIAC) 20 MG/ML IV SOLN
INTRAVENOUS | Status: DC | PRN
  Administered 2013-11-12 (×2): 50 mg via INTRAVENOUS

## 2013-11-12 MED ORDER — PROPOFOL 10 MG/ML IV EMUL
INTRAVENOUS | Status: DC | PRN
  Administered 2013-11-12: 50 ug/kg/min via INTRAVENOUS

## 2013-11-12 MED ORDER — CEFAZOLIN SODIUM-DEXTROSE 2-3 GM-% IV SOLR
INTRAVENOUS | Status: DC | PRN
  Administered 2013-11-12: 2 g via INTRAVENOUS

## 2013-11-12 MED ORDER — ONDANSETRON HCL 4 MG/2ML IJ SOLN
INTRAMUSCULAR | Status: DC | PRN
  Administered 2013-11-12: 4 mg via INTRAVENOUS

## 2013-11-12 MED ORDER — FENTANYL CITRATE 0.05 MG/ML IJ SOLN
INTRAMUSCULAR | Status: AC
  Filled 2013-11-12: qty 2

## 2013-11-12 MED ORDER — BSS IO SOLN
INTRAOCULAR | Status: AC
  Filled 2013-11-12: qty 15

## 2013-11-12 MED ORDER — MIDAZOLAM HCL 2 MG/2ML IJ SOLN
INTRAMUSCULAR | Status: AC
Start: 2013-11-12 — End: 2013-11-12
  Filled 2013-11-12: qty 2

## 2013-11-12 MED ORDER — MIDAZOLAM HCL 2 MG/2ML IJ SOLN
1.0000 mg | INTRAMUSCULAR | Status: DC | PRN
  Administered 2013-11-12: 1 mg via INTRAVENOUS

## 2013-11-12 MED ORDER — FENTANYL CITRATE 0.05 MG/ML IJ SOLN
50.0000 ug | INTRAMUSCULAR | Status: DC | PRN
  Administered 2013-11-12: 50 ug via INTRAVENOUS

## 2013-11-12 SURGICAL SUPPLY — 61 items
BANDAGE ADH SHEER 1  50/CT (GAUZE/BANDAGES/DRESSINGS) IMPLANT
BANDAGE COBAN STERILE 2 (GAUZE/BANDAGES/DRESSINGS) IMPLANT
BANDAGE ELASTIC 3 VELCRO ST LF (GAUZE/BANDAGES/DRESSINGS) ×3 IMPLANT
BLADE MINI RND TIP GREEN BEAV (BLADE) IMPLANT
BLADE SURG 15 STRL LF DISP TIS (BLADE) ×1 IMPLANT
BLADE SURG 15 STRL SS (BLADE) ×3
BNDG CMPR 9X4 STRL LF SNTH (GAUZE/BANDAGES/DRESSINGS) ×1
BNDG CMPR MD 5X2 ELC HKLP STRL (GAUZE/BANDAGES/DRESSINGS)
BNDG COHESIVE 3X5 TAN STRL LF (GAUZE/BANDAGES/DRESSINGS) ×4 IMPLANT
BNDG ELASTIC 2 VLCR STRL LF (GAUZE/BANDAGES/DRESSINGS) IMPLANT
BNDG ESMARK 4X9 LF (GAUZE/BANDAGES/DRESSINGS) ×3 IMPLANT
BNDG GAUZE ELAST 4 BULKY (GAUZE/BANDAGES/DRESSINGS) ×2 IMPLANT
BRUSH SCRUB EZ PLAIN DRY (MISCELLANEOUS) ×3 IMPLANT
CLOSURE WOUND 1/2 X4 (GAUZE/BANDAGES/DRESSINGS)
CORDS BIPOLAR (ELECTRODE) ×3 IMPLANT
COVER MAYO STAND STRL (DRAPES) ×3 IMPLANT
COVER TABLE BACK 60X90 (DRAPES) ×3 IMPLANT
CUFF TOURNIQUET SINGLE 18IN (TOURNIQUET CUFF) ×2 IMPLANT
DECANTER SPIKE VIAL GLASS SM (MISCELLANEOUS) IMPLANT
DRAPE EXTREMITY T 121X128X90 (DRAPE) ×3 IMPLANT
DRAPE SURG 17X23 STRL (DRAPES) ×3 IMPLANT
GAUZE XEROFORM 1X8 LF (GAUZE/BANDAGES/DRESSINGS) IMPLANT
GLOVE BIO SURGEON STRL SZ7 (GLOVE) ×2 IMPLANT
GLOVE BIOGEL M STRL SZ7.5 (GLOVE) ×3 IMPLANT
GLOVE BIOGEL PI IND STRL 7.0 (GLOVE) IMPLANT
GLOVE BIOGEL PI IND STRL 7.5 (GLOVE) IMPLANT
GLOVE BIOGEL PI INDICATOR 7.0 (GLOVE) ×2
GLOVE BIOGEL PI INDICATOR 7.5 (GLOVE) ×2
GLOVE EPREMIER NITRL EXT CFF L (GLOVE) IMPLANT
GLOVE EXAM NITRILE EXT CFF LRG (GLOVE) ×3 IMPLANT
GLOVE EXAM NITRILE PF MED BLUE (GLOVE) ×2 IMPLANT
GLOVE ORTHO TXT STRL SZ7.5 (GLOVE) ×3 IMPLANT
GOWN STRL REUS W/ TWL LRG LVL3 (GOWN DISPOSABLE) ×1 IMPLANT
GOWN STRL REUS W/TWL LRG LVL3 (GOWN DISPOSABLE) ×3
GOWN STRL REUS W/TWL XL LVL3 (GOWN DISPOSABLE) ×4 IMPLANT
LOOP VESSEL MAXI BLUE (MISCELLANEOUS) IMPLANT
NEEDLE 27GAX1X1/2 (NEEDLE) IMPLANT
NS IRRIG 1000ML POUR BTL (IV SOLUTION) ×3 IMPLANT
PACK BASIN DAY SURGERY FS (CUSTOM PROCEDURE TRAY) ×3 IMPLANT
PAD CAST 3X4 CTTN HI CHSV (CAST SUPPLIES) ×1 IMPLANT
PADDING CAST ABS 4INX4YD NS (CAST SUPPLIES) ×2
PADDING CAST ABS COTTON 4X4 ST (CAST SUPPLIES) ×1 IMPLANT
PADDING CAST COTTON 3X4 STRL (CAST SUPPLIES) ×3
PADDING UNDERCAST 2  STERILE (CAST SUPPLIES) ×3 IMPLANT
SLEEVE SCD COMPRESS KNEE MED (MISCELLANEOUS) IMPLANT
SPLINT PLASTER CAST XFAST 3X15 (CAST SUPPLIES) IMPLANT
SPLINT PLASTER XTRA FASTSET 3X (CAST SUPPLIES) ×20
SPONGE GAUZE 4X4 12PLY (GAUZE/BANDAGES/DRESSINGS) ×3 IMPLANT
STOCKINETTE 4X48 STRL (DRAPES) ×3 IMPLANT
STRIP CLOSURE SKIN 1/2X4 (GAUZE/BANDAGES/DRESSINGS) IMPLANT
SUT ETHIBOND 3-0 V-5 (SUTURE) ×2 IMPLANT
SUT PROLENE 3 0 PS 2 (SUTURE) IMPLANT
SUT PROLENE 4 0 P 3 18 (SUTURE) ×2 IMPLANT
SUT VIC AB 4-0 P-3 18XBRD (SUTURE) ×1 IMPLANT
SUT VIC AB 4-0 P3 18 (SUTURE) ×3
SYR 3ML 23GX1 SAFETY (SYRINGE) IMPLANT
SYR BULB 3OZ (MISCELLANEOUS) ×3 IMPLANT
SYR CONTROL 10ML LL (SYRINGE) ×1 IMPLANT
TOWEL OR 17X24 6PK STRL BLUE (TOWEL DISPOSABLE) ×6 IMPLANT
TRAY DSU PREP LF (CUSTOM PROCEDURE TRAY) ×3 IMPLANT
UNDERPAD 30X30 INCONTINENT (UNDERPADS AND DIAPERS) ×3 IMPLANT

## 2013-11-12 NOTE — Anesthesia Postprocedure Evaluation (Signed)
  Anesthesia Post-op Note  Patient: Gloria Best  Procedure(s) Performed: Procedure(s): LEFT TENDON GRAFT RECONSTRUCTION OF LEFT LONG FINGER SAGITTAL FIBERS CORD RELEASE (Left)  Patient Location: PACU  Anesthesia Type:Regional  Level of Consciousness: awake and alert   Airway and Oxygen Therapy: Patient Spontanous Breathing  Post-op Pain: none  Post-op Assessment: Post-op Vital signs reviewed  Post-op Vital Signs: stable  Complications: No apparent anesthesia complications

## 2013-11-12 NOTE — Anesthesia Preprocedure Evaluation (Signed)
Anesthesia Evaluation  Patient identified by MRN, date of birth, ID band  Reviewed: Allergy & Precautions, H&P , NPO status , Patient's Chart, lab work & pertinent test results  Airway       Dental   Pulmonary shortness of breath, asthma , former smoker,          Cardiovascular hypertension, + dysrhythmias     Neuro/Psych Depression  Neuromuscular disease    GI/Hepatic hiatal hernia, GERD-  ,  Endo/Other  diabetes  Renal/GU      Musculoskeletal  (+) Fibromyalgia -  Abdominal   Peds  Hematology  (+) anemia ,   Anesthesia Other Findings   Reproductive/Obstetrics                           Anesthesia Physical Anesthesia Plan  ASA: III  Anesthesia Plan: Regional   Post-op Pain Management:    Induction: Intravenous  Airway Management Planned:   Additional Equipment:   Intra-op Plan:   Post-operative Plan:   Informed Consent: I have reviewed the patients History and Physical, chart, labs and discussed the procedure including the risks, benefits and alternatives for the proposed anesthesia with the patient or authorized representative who has indicated his/her understanding and acceptance.   Dental advisory given  Plan Discussed with: CRNA and Surgeon  Anesthesia Plan Comments:         Anesthesia Quick Evaluation

## 2013-11-12 NOTE — Anesthesia Procedure Notes (Signed)
Anesthesia Regional Block:  Supraclavicular block  Pre-Anesthetic Checklist: ,, timeout performed, Correct Patient, Correct Site, Correct Laterality, Correct Procedure, Correct Position, site marked, Risks and benefits discussed, Surgical consent,  Pre-op evaluation,  At surgeon's request  Laterality: Left and Upper  Prep: chloraprep       Needles:   Needle Type: Echogenic Needle     Needle Length:cm 9 cm Needle Gauge: 22 and 22 G  Needle insertion depth: 5 cm   Additional Needles:  Procedures: ultrasound guided (picture in chart) Supraclavicular block Narrative:  Start time: 11/12/2013 8:15 AM End time: 11/12/2013 8:30 AM Injection made incrementally with aspirations every 5 mL.  Performed by: Personally  Anesthesiologist: T Aurielle Slingerland  Additional Notes: Monitors on , sedation begun, tolerated well

## 2013-11-12 NOTE — Progress Notes (Signed)
Assisted Dr. Massagee with left, ultrasound guided, supraclavicular block. Side rails up, monitors on throughout procedure. See vital signs in flow sheet. Tolerated Procedure well. 

## 2013-11-12 NOTE — Brief Op Note (Signed)
11/12/2013  9:46 AM  PATIENT:  Gloria Best  73 y.o. female  PRE-OPERATIVE DIAGNOSIS:  LEFT LONG RADIAL SAGITTAL FIBER TEAR  POST-OPERATIVE DIAGNOSIS:  LEFT LONG RADIAL SAGITTAL FIBER TEAR  PROCEDURE:  Procedure(s): LEFT TENDON GRAFT RECONSTRUCTION OF LEFT LONG FINGER SAGITTAL FIBERS CORD RELEASE (Left)  SURGEON:  Surgeon(s) and Role:    * Cammie Sickle., MD - Primary  PHYSICIAN ASSISTANT:   ASSISTANTS: nurse   ANESTHESIA:   Plexus block and sedation  EBL: trace  BLOOD ADMINISTERED:none  DRAINS: none   LOCAL MEDICATIONS USED: see anesthesia  SPECIMEN:  No Specimen  DISPOSITION OF SPECIMEN:  N/A  COUNTS:  YES  TOURNIQUET:   Total Tourniquet Time Documented: Upper Arm (Left) - 39 minutes Total: Upper Arm (Left) - 39 minutes   DICTATION: .Other Dictation: Dictation Number [id number]  PLAN OF CARE: Discharge to home after PACU  PATIENT DISPOSITION:  PACU - hemodynamically stable.   Delay start of Pharmacological VTE agent (>24hrs) due to surgical blood loss or risk of bleeding: not applicable

## 2013-11-12 NOTE — Discharge Instructions (Addendum)
[facility] Center Instructions Hand Surgery  Wound Care: Keep your hand elevated above the level of your heart.  Do not allow it to dangle by your side.  Keep the dressing dry and do not remove it unless your doctor advises you to do so.  He will usually change it at the time of your post-op visit.  Moving your fingers is advised to stimulate circulation but will depend on the site of your surgery.  If you have a splint applied, your doctor will advise you regarding movement.  Activity: Do not drive or operate machinery today.  Rest today and then you may return to your normal activity and work as indicated by your physician.  Diet:  Drink liquids today or eat a light diet.  You may resume a regular diet tomorrow.    General expectations: Pain for two to three days. Fingers may become slightly swollen.  Call your doctor if any of the following occur: Severe pain not relieved by pain medication. Elevated temperature. Dressing soaked with blood. Inability to move fingers. White or bluish color to fingers.  Keep bandage dry while bathing.  RESUME PRADAXA TODAY   Post Anesthesia Home Care Instructions  Activity: Get plenty of rest for the remainder of the day. A responsible adult should stay with you for 24 hours following the procedure.  For the next 24 hours, DO NOT: -Drive a car -Paediatric nurse -Drink alcoholic beverages -Take any medication unless instructed by your physician -Make any legal decisions or sign important papers.  Meals: Start with liquid foods such as gelatin or soup. Progress to regular foods as tolerated. Avoid greasy, spicy, heavy foods. If nausea and/or vomiting occur, drink only clear liquids until the nausea and/or vomiting subsides. Call your physician if vomiting continues.  Special Instructions/Symptoms: Your throat may feel dry or sore from the anesthesia or the breathing tube placed in your throat during surgery. If this causes discomfort, gargle with  warm salt water. The discomfort should disappear within 24 hours.   Regional Anesthesia Blocks  1. Numbness or the inability to move the "blocked" extremity may last from 3-48 hours after placement. The length of time depends on the medication injected and your individual response to the medication. If the numbness is not going away after 48 hours, call your surgeon.  2. The extremity that is blocked will need to be protected until the numbness is gone and the  Strength has returned. Because you cannot feel it, you will need to take extra care to avoid injury. Because it may be weak, you may have difficulty moving it or using it. You may not know what position it is in without looking at it while the block is in effect.  3. For blocks in the legs and feet, returning to weight bearing and walking needs to be done carefully. You will need to wait until the numbness is entirely gone and the strength has returned. You should be able to move your leg and foot normally before you try and bear weight or walk. You will need someone to be with you when you first try to ensure you do not fall and possibly risk injury.  4. Bruising and tenderness at the needle site are common side effects and will resolve in a few days.  5. Persistent numbness or new problems with movement should be communicated to the surgeon or the [place] [phone] [place] [phone]).

## 2013-11-12 NOTE — Transfer of Care (Signed)
Immediate Anesthesia Transfer of Care Note  Patient: Gloria Best  Procedure(s) Performed: Procedure(s): LEFT TENDON GRAFT RECONSTRUCTION OF LEFT LONG FINGER SAGITTAL FIBERS CORD RELEASE (Left)  Patient Location: PACU  Anesthesia Type:MAC combined with regional for post-op pain  Level of Consciousness: awake, alert  and oriented  Airway & Oxygen Therapy: Patient Spontanous Breathing and Patient connected to face mask oxygen  Post-op Assessment: Report given to PACU RN and Post -op Vital signs reviewed and stable  Post vital signs: Reviewed and stable  Complications: No apparent anesthesia complications

## 2013-11-12 NOTE — Op Note (Signed)
[id number] 

## 2013-11-13 ENCOUNTER — Encounter (HOSPITAL_BASED_OUTPATIENT_CLINIC_OR_DEPARTMENT_OTHER): Payer: Self-pay | Admitting: Orthopedic Surgery

## 2013-11-13 NOTE — Op Note (Signed)
[name], [name]               ACCOUNT NO.:  [id number]  MEDICAL RECORD NO.:  [id number]  LOCATION:                                 FACILITY:  PHYSICIAN:  Youlanda Mighty. Anderson Middlebrooks, M.D.      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  OPERATION:  Reconstruction of left long finger radial sagittal fibers with a distally based tendon graft from extensor digitorum longus of long finger supplemented by transfer of extensor digitorum communis of ring finger which had a paired tendon.  OPERATING SURGEON:  Youlanda Mighty. Braxxton Stoudt, M.D.  ASSISTANT:  Registered nurse.  ANESTHESIA:  Plexus block placed by Dr. Orene Desanctis, attending anesthesiologist in the holding area supplemented by IV sedation.  SUPERVISING ANESTHESIOLOGIST:  Ala Dach, M.D.  INDICATIONS:  Gloria Best is a 73 year old woman with a history of chronic rheumatoid arthritis.  She presented for evaluation by Dr. Basil Dess, attending orthopedic surgeon 3 months prior with a ulnarly deviated and flexed left long finger.  She is known to have rheumatoid arthritis.  She also has minimal Dupuytren's palmar fibromatosis that has begun to involve the pretendinous fibers of the long finger in the palm.  Dr. Louanne Skye recognized the radial sagittal fiber rupture and very appropriately splinted her for 2 months.  This was unsuccessful.  He then requested a Hand Surgery consult.  Gloria Best  had been previously evaluated in our practice for other rheumatoid issues.  She was advised to undergo a radial sagittal fiber reconstruction with a tendon graft.  We may also augment this with a tendon transfer due to a very diminutive extensor communis.  After informed consent, she is brought to the operating at this time.  Preoperatively, she was advised to withhold her Pradaxa for 48 hours prior to surgery.  She will begin her Pradaxa immediately following surgery.  Dr. Orene Desanctis was aware of her anticoagulation status and reviewed her complex past medical history.  After informed consent, she is brought to the operating at this time.  PROCEDURE:  Gloria Best was interviewed in the holding area and a proper surgical site identified per protocol with the marking pen.  Dr. Orene Desanctis determined that she had been off Pradaxa for an adequate period of time, and determined that it would be safe for her to have a plexus block.  This was placed with ultrasound guidance in the holding area without complication.  This led to excellent anesthesia of the left upper extremity.  She was then carefully transferred to room 2 of the [facility], where under Dr. Orene Desanctis direct supervision, IV sedation was provided.  The left hand and arm were prepped with Betadine soap and solution, sterilely draped.  Ancef 1  g was administered IV prior to inflation of the tourniquet.  Sterile stockinette impervious arthroscopy drapes were applied, followed by a routine surgical time-out.  The left hand and arm were then exsanguinated with an Esmarch bandage and the arterial tourniquet inflated 250 mmHg.  Procedure commenced with a curvilinear incision exposing the extensor mechanism.  We immediately identified a rupture of the radial sagittal fibers and subluxation of the extensor tendon into the valley between the long and ring finger metacarpal heads.  The ulnar sagittal fibers were moderately  contracted.  We partially released the ulnar sagittal fibers and tenolysed the extensor tendons.  There was a very diminutive long finger extensor, therefore, we elected a tendon transfer to help animate the long finger from the ring finger.  She had 2 large caliber ring finger extensors.  The radial sagittal fibers remaining were elevated off of the capsule followed by an attempt to identify the intermetacarpal ligament.  Her intermetacarpal ligament was quite diminutive, therefore, we elected to perform a tendon graft through the proximal and palmar portion of the radial sagittal fiber remnant. A strip of tendon measuring 2 mm in width and approximately 7 cm in length was harvested and placed distally.  This was then woven x2 to the remnants of the radial sagittal fibers creating a double V-type radial sagittal fiber reconstruction.  The proximal 1/3rd of the radial slip of the extensor digitorum longus to the ring finger was then harvested and split back to level of the metacarpal base.  This was then rerouted to augment extension for the long finger.  The sagittal fiber reconstruction and the transfer tendon were woven in a complex a Pulvertaft-type weave with multiple passes and interrupted sutures of 3- 0 Ethibond.  A very satisfactory construct was achieved with these extensor tendon centralized directly over the long finger metacarpal head.  The extensor lag and ulnar deviation were corrected.  After finishing sutures were placed, the wound was subsequently closed in layers.  We checked and found that there was no evidence of intrinsic tightness.  The skin was repaired with subcutaneous 4-0 Vicryl and intradermal 4-0 Prolene.  Due to an adhesive allergy, Steri-Strips were not used. Adaptic was applied to the wound, followed by a sterile gauze, sterile Webril, and a volar plaster splint maintaining the long fingertip at 0 degrees flexion at the MP joint, and slight radial  deviation.  Coban was used to complete the dressing.  There were no apparent complications.  For aftercare, Gloria Best is provided prescriptions for Percocet 5 mg 1 or 2 tablets p.o. q.4-6 hours p.r.n. pain, 30 tablets without refill.  She will resume her Pradaxa this evening.  She will take Keflex 500 mg 1 p.o. q.8 hours x4 days as a prophylactic antibiotic.     Youlanda Mighty Wister Hoefle, M.D.     RVS/MEDQ  D:  11/12/2013  T:  11/13/2013  Job:  [id number]  cc:   [name], M.D.

## 2013-11-19 DIAGNOSIS — D638 Anemia in other chronic diseases classified elsewhere: Secondary | ICD-10-CM | POA: Diagnosis not present

## 2013-11-19 DIAGNOSIS — Z1389 Encounter for screening for other disorder: Secondary | ICD-10-CM | POA: Diagnosis not present

## 2013-11-19 DIAGNOSIS — D518 Other vitamin B12 deficiency anemias: Secondary | ICD-10-CM | POA: Diagnosis not present

## 2013-11-19 DIAGNOSIS — Z6833 Body mass index (BMI) 33.0-33.9, adult: Secondary | ICD-10-CM | POA: Diagnosis not present

## 2013-11-19 DIAGNOSIS — M66249 Spontaneous rupture of extensor tendons, unspecified hand: Secondary | ICD-10-CM | POA: Diagnosis not present

## 2013-11-19 DIAGNOSIS — M66239 Spontaneous rupture of extensor tendons, unspecified forearm: Secondary | ICD-10-CM | POA: Diagnosis not present

## 2013-11-30 DIAGNOSIS — D500 Iron deficiency anemia secondary to blood loss (chronic): Secondary | ICD-10-CM | POA: Diagnosis not present

## 2013-11-30 DIAGNOSIS — K219 Gastro-esophageal reflux disease without esophagitis: Secondary | ICD-10-CM | POA: Diagnosis not present

## 2013-11-30 DIAGNOSIS — K5730 Diverticulosis of large intestine without perforation or abscess without bleeding: Secondary | ICD-10-CM | POA: Diagnosis not present

## 2013-11-30 DIAGNOSIS — K5900 Constipation, unspecified: Secondary | ICD-10-CM | POA: Diagnosis not present

## 2013-12-02 DIAGNOSIS — M069 Rheumatoid arthritis, unspecified: Secondary | ICD-10-CM | POA: Diagnosis not present

## 2013-12-02 DIAGNOSIS — D509 Iron deficiency anemia, unspecified: Secondary | ICD-10-CM | POA: Diagnosis not present

## 2013-12-02 DIAGNOSIS — D638 Anemia in other chronic diseases classified elsewhere: Secondary | ICD-10-CM | POA: Diagnosis not present

## 2013-12-02 DIAGNOSIS — E538 Deficiency of other specified B group vitamins: Secondary | ICD-10-CM | POA: Diagnosis not present

## 2013-12-02 DIAGNOSIS — Z09 Encounter for follow-up examination after completed treatment for conditions other than malignant neoplasm: Secondary | ICD-10-CM | POA: Diagnosis not present

## 2013-12-07 DIAGNOSIS — M069 Rheumatoid arthritis, unspecified: Secondary | ICD-10-CM | POA: Diagnosis not present

## 2013-12-15 DIAGNOSIS — K648 Other hemorrhoids: Secondary | ICD-10-CM | POA: Diagnosis not present

## 2013-12-15 DIAGNOSIS — D126 Benign neoplasm of colon, unspecified: Secondary | ICD-10-CM | POA: Diagnosis not present

## 2013-12-15 DIAGNOSIS — Z860100 Personal history of colon polyps, unspecified: Secondary | ICD-10-CM | POA: Diagnosis not present

## 2013-12-15 DIAGNOSIS — I10 Essential (primary) hypertension: Secondary | ICD-10-CM | POA: Diagnosis not present

## 2013-12-15 DIAGNOSIS — I519 Heart disease, unspecified: Secondary | ICD-10-CM | POA: Diagnosis not present

## 2013-12-15 DIAGNOSIS — D509 Iron deficiency anemia, unspecified: Secondary | ICD-10-CM | POA: Diagnosis not present

## 2013-12-15 DIAGNOSIS — K633 Ulcer of intestine: Secondary | ICD-10-CM | POA: Diagnosis not present

## 2013-12-15 DIAGNOSIS — Z79899 Other long term (current) drug therapy: Secondary | ICD-10-CM | POA: Diagnosis not present

## 2013-12-15 DIAGNOSIS — E119 Type 2 diabetes mellitus without complications: Secondary | ICD-10-CM | POA: Diagnosis not present

## 2013-12-15 DIAGNOSIS — J45909 Unspecified asthma, uncomplicated: Secondary | ICD-10-CM | POA: Diagnosis not present

## 2013-12-15 DIAGNOSIS — K5730 Diverticulosis of large intestine without perforation or abscess without bleeding: Secondary | ICD-10-CM | POA: Diagnosis not present

## 2013-12-15 DIAGNOSIS — K219 Gastro-esophageal reflux disease without esophagitis: Secondary | ICD-10-CM | POA: Diagnosis not present

## 2014-01-05 DIAGNOSIS — M069 Rheumatoid arthritis, unspecified: Secondary | ICD-10-CM | POA: Diagnosis not present

## 2014-01-06 DIAGNOSIS — M069 Rheumatoid arthritis, unspecified: Secondary | ICD-10-CM | POA: Diagnosis not present

## 2014-01-06 DIAGNOSIS — R300 Dysuria: Secondary | ICD-10-CM | POA: Diagnosis not present

## 2014-01-06 DIAGNOSIS — E119 Type 2 diabetes mellitus without complications: Secondary | ICD-10-CM | POA: Diagnosis not present

## 2014-01-06 DIAGNOSIS — D500 Iron deficiency anemia secondary to blood loss (chronic): Secondary | ICD-10-CM | POA: Diagnosis not present

## 2014-01-06 DIAGNOSIS — D638 Anemia in other chronic diseases classified elsewhere: Secondary | ICD-10-CM | POA: Diagnosis not present

## 2014-01-06 DIAGNOSIS — Z09 Encounter for follow-up examination after completed treatment for conditions other than malignant neoplasm: Secondary | ICD-10-CM | POA: Diagnosis not present

## 2014-01-21 DIAGNOSIS — M159 Polyosteoarthritis, unspecified: Secondary | ICD-10-CM | POA: Diagnosis not present

## 2014-01-21 DIAGNOSIS — M6530 Trigger finger, unspecified finger: Secondary | ICD-10-CM | POA: Diagnosis not present

## 2014-01-21 DIAGNOSIS — M069 Rheumatoid arthritis, unspecified: Secondary | ICD-10-CM | POA: Diagnosis not present

## 2014-01-21 DIAGNOSIS — IMO0001 Reserved for inherently not codable concepts without codable children: Secondary | ICD-10-CM | POA: Diagnosis not present

## 2014-01-25 DIAGNOSIS — J441 Chronic obstructive pulmonary disease with (acute) exacerbation: Secondary | ICD-10-CM | POA: Diagnosis not present

## 2014-01-25 DIAGNOSIS — Z6833 Body mass index (BMI) 33.0-33.9, adult: Secondary | ICD-10-CM | POA: Diagnosis not present

## 2014-01-26 DIAGNOSIS — F329 Major depressive disorder, single episode, unspecified: Secondary | ICD-10-CM | POA: Diagnosis present

## 2014-01-26 DIAGNOSIS — K5289 Other specified noninfective gastroenteritis and colitis: Secondary | ICD-10-CM | POA: Diagnosis not present

## 2014-01-26 DIAGNOSIS — N179 Acute kidney failure, unspecified: Secondary | ICD-10-CM | POA: Diagnosis not present

## 2014-01-26 DIAGNOSIS — I509 Heart failure, unspecified: Secondary | ICD-10-CM | POA: Diagnosis not present

## 2014-01-26 DIAGNOSIS — Z792 Long term (current) use of antibiotics: Secondary | ICD-10-CM | POA: Diagnosis not present

## 2014-01-26 DIAGNOSIS — R1115 Cyclical vomiting syndrome unrelated to migraine: Secondary | ICD-10-CM | POA: Diagnosis not present

## 2014-01-26 DIAGNOSIS — K219 Gastro-esophageal reflux disease without esophagitis: Secondary | ICD-10-CM | POA: Diagnosis present

## 2014-01-26 DIAGNOSIS — Z79899 Other long term (current) drug therapy: Secondary | ICD-10-CM | POA: Diagnosis not present

## 2014-01-26 DIAGNOSIS — E860 Dehydration: Secondary | ICD-10-CM | POA: Diagnosis not present

## 2014-01-26 DIAGNOSIS — F3289 Other specified depressive episodes: Secondary | ICD-10-CM | POA: Diagnosis present

## 2014-01-26 DIAGNOSIS — E7800 Pure hypercholesterolemia, unspecified: Secondary | ICD-10-CM | POA: Diagnosis present

## 2014-01-26 DIAGNOSIS — J9600 Acute respiratory failure, unspecified whether with hypoxia or hypercapnia: Secondary | ICD-10-CM | POA: Diagnosis not present

## 2014-01-26 DIAGNOSIS — I10 Essential (primary) hypertension: Secondary | ICD-10-CM | POA: Diagnosis present

## 2014-01-26 DIAGNOSIS — R5381 Other malaise: Secondary | ICD-10-CM | POA: Diagnosis not present

## 2014-01-26 DIAGNOSIS — M069 Rheumatoid arthritis, unspecified: Secondary | ICD-10-CM | POA: Diagnosis present

## 2014-01-26 DIAGNOSIS — D72829 Elevated white blood cell count, unspecified: Secondary | ICD-10-CM | POA: Diagnosis not present

## 2014-01-26 DIAGNOSIS — J159 Unspecified bacterial pneumonia: Secondary | ICD-10-CM | POA: Diagnosis not present

## 2014-01-26 DIAGNOSIS — A09 Infectious gastroenteritis and colitis, unspecified: Secondary | ICD-10-CM | POA: Diagnosis not present

## 2014-01-26 DIAGNOSIS — R5383 Other fatigue: Secondary | ICD-10-CM | POA: Diagnosis not present

## 2014-01-26 DIAGNOSIS — J189 Pneumonia, unspecified organism: Secondary | ICD-10-CM | POA: Diagnosis not present

## 2014-01-26 DIAGNOSIS — Z7901 Long term (current) use of anticoagulants: Secondary | ICD-10-CM | POA: Diagnosis not present

## 2014-01-26 DIAGNOSIS — I4891 Unspecified atrial fibrillation: Secondary | ICD-10-CM | POA: Diagnosis present

## 2014-01-26 DIAGNOSIS — J441 Chronic obstructive pulmonary disease with (acute) exacerbation: Secondary | ICD-10-CM | POA: Diagnosis not present

## 2014-02-01 DIAGNOSIS — M069 Rheumatoid arthritis, unspecified: Secondary | ICD-10-CM | POA: Diagnosis not present

## 2014-02-01 DIAGNOSIS — E119 Type 2 diabetes mellitus without complications: Secondary | ICD-10-CM | POA: Diagnosis not present

## 2014-02-01 DIAGNOSIS — J159 Unspecified bacterial pneumonia: Secondary | ICD-10-CM | POA: Diagnosis not present

## 2014-02-01 DIAGNOSIS — I509 Heart failure, unspecified: Secondary | ICD-10-CM | POA: Diagnosis not present

## 2014-02-01 DIAGNOSIS — J441 Chronic obstructive pulmonary disease with (acute) exacerbation: Secondary | ICD-10-CM | POA: Diagnosis not present

## 2014-02-01 DIAGNOSIS — A09 Infectious gastroenteritis and colitis, unspecified: Secondary | ICD-10-CM | POA: Diagnosis not present

## 2014-02-02 DIAGNOSIS — J159 Unspecified bacterial pneumonia: Secondary | ICD-10-CM | POA: Diagnosis not present

## 2014-02-02 DIAGNOSIS — I509 Heart failure, unspecified: Secondary | ICD-10-CM | POA: Diagnosis not present

## 2014-02-02 DIAGNOSIS — M069 Rheumatoid arthritis, unspecified: Secondary | ICD-10-CM | POA: Diagnosis not present

## 2014-02-02 DIAGNOSIS — J441 Chronic obstructive pulmonary disease with (acute) exacerbation: Secondary | ICD-10-CM | POA: Diagnosis not present

## 2014-02-02 DIAGNOSIS — E119 Type 2 diabetes mellitus without complications: Secondary | ICD-10-CM | POA: Diagnosis not present

## 2014-02-02 DIAGNOSIS — A09 Infectious gastroenteritis and colitis, unspecified: Secondary | ICD-10-CM | POA: Diagnosis not present

## 2014-02-03 DIAGNOSIS — A09 Infectious gastroenteritis and colitis, unspecified: Secondary | ICD-10-CM | POA: Diagnosis not present

## 2014-02-03 DIAGNOSIS — I509 Heart failure, unspecified: Secondary | ICD-10-CM | POA: Diagnosis not present

## 2014-02-03 DIAGNOSIS — J441 Chronic obstructive pulmonary disease with (acute) exacerbation: Secondary | ICD-10-CM | POA: Diagnosis not present

## 2014-02-03 DIAGNOSIS — E119 Type 2 diabetes mellitus without complications: Secondary | ICD-10-CM | POA: Diagnosis not present

## 2014-02-03 DIAGNOSIS — M069 Rheumatoid arthritis, unspecified: Secondary | ICD-10-CM | POA: Diagnosis not present

## 2014-02-03 DIAGNOSIS — J159 Unspecified bacterial pneumonia: Secondary | ICD-10-CM | POA: Diagnosis not present

## 2014-02-04 DIAGNOSIS — J189 Pneumonia, unspecified organism: Secondary | ICD-10-CM | POA: Diagnosis not present

## 2014-02-04 DIAGNOSIS — R112 Nausea with vomiting, unspecified: Secondary | ICD-10-CM | POA: Diagnosis not present

## 2014-02-04 DIAGNOSIS — Z6832 Body mass index (BMI) 32.0-32.9, adult: Secondary | ICD-10-CM | POA: Diagnosis not present

## 2014-02-08 DIAGNOSIS — I509 Heart failure, unspecified: Secondary | ICD-10-CM | POA: Diagnosis not present

## 2014-02-08 DIAGNOSIS — J159 Unspecified bacterial pneumonia: Secondary | ICD-10-CM | POA: Diagnosis not present

## 2014-02-08 DIAGNOSIS — M069 Rheumatoid arthritis, unspecified: Secondary | ICD-10-CM | POA: Diagnosis not present

## 2014-02-08 DIAGNOSIS — J441 Chronic obstructive pulmonary disease with (acute) exacerbation: Secondary | ICD-10-CM | POA: Diagnosis not present

## 2014-02-08 DIAGNOSIS — E119 Type 2 diabetes mellitus without complications: Secondary | ICD-10-CM | POA: Diagnosis not present

## 2014-02-08 DIAGNOSIS — A09 Infectious gastroenteritis and colitis, unspecified: Secondary | ICD-10-CM | POA: Diagnosis not present

## 2014-02-09 DIAGNOSIS — M069 Rheumatoid arthritis, unspecified: Secondary | ICD-10-CM | POA: Diagnosis not present

## 2014-02-10 DIAGNOSIS — M069 Rheumatoid arthritis, unspecified: Secondary | ICD-10-CM | POA: Diagnosis not present

## 2014-02-10 DIAGNOSIS — D518 Other vitamin B12 deficiency anemias: Secondary | ICD-10-CM | POA: Diagnosis not present

## 2014-02-10 DIAGNOSIS — A09 Infectious gastroenteritis and colitis, unspecified: Secondary | ICD-10-CM | POA: Diagnosis not present

## 2014-02-10 DIAGNOSIS — E1149 Type 2 diabetes mellitus with other diabetic neurological complication: Secondary | ICD-10-CM | POA: Diagnosis not present

## 2014-02-10 DIAGNOSIS — J159 Unspecified bacterial pneumonia: Secondary | ICD-10-CM | POA: Diagnosis not present

## 2014-02-10 DIAGNOSIS — E119 Type 2 diabetes mellitus without complications: Secondary | ICD-10-CM | POA: Diagnosis not present

## 2014-02-10 DIAGNOSIS — E782 Mixed hyperlipidemia: Secondary | ICD-10-CM | POA: Diagnosis not present

## 2014-02-10 DIAGNOSIS — I10 Essential (primary) hypertension: Secondary | ICD-10-CM | POA: Diagnosis not present

## 2014-02-10 DIAGNOSIS — J441 Chronic obstructive pulmonary disease with (acute) exacerbation: Secondary | ICD-10-CM | POA: Diagnosis not present

## 2014-02-10 DIAGNOSIS — I509 Heart failure, unspecified: Secondary | ICD-10-CM | POA: Diagnosis not present

## 2014-02-11 DIAGNOSIS — I509 Heart failure, unspecified: Secondary | ICD-10-CM | POA: Diagnosis not present

## 2014-02-11 DIAGNOSIS — J159 Unspecified bacterial pneumonia: Secondary | ICD-10-CM | POA: Diagnosis not present

## 2014-02-11 DIAGNOSIS — M069 Rheumatoid arthritis, unspecified: Secondary | ICD-10-CM | POA: Diagnosis not present

## 2014-02-11 DIAGNOSIS — J441 Chronic obstructive pulmonary disease with (acute) exacerbation: Secondary | ICD-10-CM | POA: Diagnosis not present

## 2014-02-11 DIAGNOSIS — E119 Type 2 diabetes mellitus without complications: Secondary | ICD-10-CM | POA: Diagnosis not present

## 2014-02-11 DIAGNOSIS — A09 Infectious gastroenteritis and colitis, unspecified: Secondary | ICD-10-CM | POA: Diagnosis not present

## 2014-02-12 DIAGNOSIS — M1710 Unilateral primary osteoarthritis, unspecified knee: Secondary | ICD-10-CM | POA: Diagnosis not present

## 2014-02-15 DIAGNOSIS — J159 Unspecified bacterial pneumonia: Secondary | ICD-10-CM | POA: Diagnosis not present

## 2014-02-15 DIAGNOSIS — A09 Infectious gastroenteritis and colitis, unspecified: Secondary | ICD-10-CM | POA: Diagnosis not present

## 2014-02-15 DIAGNOSIS — E119 Type 2 diabetes mellitus without complications: Secondary | ICD-10-CM | POA: Diagnosis not present

## 2014-02-15 DIAGNOSIS — M069 Rheumatoid arthritis, unspecified: Secondary | ICD-10-CM | POA: Diagnosis not present

## 2014-02-15 DIAGNOSIS — J441 Chronic obstructive pulmonary disease with (acute) exacerbation: Secondary | ICD-10-CM | POA: Diagnosis not present

## 2014-02-15 DIAGNOSIS — I509 Heart failure, unspecified: Secondary | ICD-10-CM | POA: Diagnosis not present

## 2014-02-17 DIAGNOSIS — I129 Hypertensive chronic kidney disease with stage 1 through stage 4 chronic kidney disease, or unspecified chronic kidney disease: Secondary | ICD-10-CM | POA: Diagnosis not present

## 2014-02-17 DIAGNOSIS — E1149 Type 2 diabetes mellitus with other diabetic neurological complication: Secondary | ICD-10-CM | POA: Diagnosis not present

## 2014-02-17 DIAGNOSIS — E782 Mixed hyperlipidemia: Secondary | ICD-10-CM | POA: Diagnosis not present

## 2014-02-17 DIAGNOSIS — N183 Chronic kidney disease, stage 3 unspecified: Secondary | ICD-10-CM | POA: Diagnosis not present

## 2014-02-18 DIAGNOSIS — I509 Heart failure, unspecified: Secondary | ICD-10-CM | POA: Diagnosis not present

## 2014-02-18 DIAGNOSIS — J159 Unspecified bacterial pneumonia: Secondary | ICD-10-CM | POA: Diagnosis not present

## 2014-02-18 DIAGNOSIS — E119 Type 2 diabetes mellitus without complications: Secondary | ICD-10-CM | POA: Diagnosis not present

## 2014-02-18 DIAGNOSIS — A09 Infectious gastroenteritis and colitis, unspecified: Secondary | ICD-10-CM | POA: Diagnosis not present

## 2014-02-18 DIAGNOSIS — M069 Rheumatoid arthritis, unspecified: Secondary | ICD-10-CM | POA: Diagnosis not present

## 2014-02-18 DIAGNOSIS — J441 Chronic obstructive pulmonary disease with (acute) exacerbation: Secondary | ICD-10-CM | POA: Diagnosis not present

## 2014-02-23 DIAGNOSIS — E1149 Type 2 diabetes mellitus with other diabetic neurological complication: Secondary | ICD-10-CM | POA: Diagnosis not present

## 2014-02-23 DIAGNOSIS — L97509 Non-pressure chronic ulcer of other part of unspecified foot with unspecified severity: Secondary | ICD-10-CM | POA: Diagnosis not present

## 2014-02-23 DIAGNOSIS — E1142 Type 2 diabetes mellitus with diabetic polyneuropathy: Secondary | ICD-10-CM | POA: Diagnosis not present

## 2014-03-02 DIAGNOSIS — M2010 Hallux valgus (acquired), unspecified foot: Secondary | ICD-10-CM | POA: Diagnosis not present

## 2014-03-02 DIAGNOSIS — L97509 Non-pressure chronic ulcer of other part of unspecified foot with unspecified severity: Secondary | ICD-10-CM | POA: Diagnosis not present

## 2014-03-02 DIAGNOSIS — E1149 Type 2 diabetes mellitus with other diabetic neurological complication: Secondary | ICD-10-CM | POA: Diagnosis not present

## 2014-03-02 DIAGNOSIS — E1142 Type 2 diabetes mellitus with diabetic polyneuropathy: Secondary | ICD-10-CM | POA: Diagnosis not present

## 2014-03-09 DIAGNOSIS — M069 Rheumatoid arthritis, unspecified: Secondary | ICD-10-CM | POA: Diagnosis not present

## 2014-03-16 DIAGNOSIS — N3020 Other chronic cystitis without hematuria: Secondary | ICD-10-CM | POA: Diagnosis not present

## 2014-03-16 DIAGNOSIS — N3941 Urge incontinence: Secondary | ICD-10-CM | POA: Diagnosis not present

## 2014-04-05 DIAGNOSIS — R350 Frequency of micturition: Secondary | ICD-10-CM | POA: Diagnosis not present

## 2014-04-05 DIAGNOSIS — N3941 Urge incontinence: Secondary | ICD-10-CM | POA: Diagnosis not present

## 2014-04-05 DIAGNOSIS — N3942 Incontinence without sensory awareness: Secondary | ICD-10-CM | POA: Diagnosis not present

## 2014-04-06 DIAGNOSIS — M069 Rheumatoid arthritis, unspecified: Secondary | ICD-10-CM | POA: Diagnosis not present

## 2014-04-07 DIAGNOSIS — H5200 Hypermetropia, unspecified eye: Secondary | ICD-10-CM | POA: Diagnosis not present

## 2014-04-07 DIAGNOSIS — E119 Type 2 diabetes mellitus without complications: Secondary | ICD-10-CM | POA: Diagnosis not present

## 2014-04-07 DIAGNOSIS — Z79899 Other long term (current) drug therapy: Secondary | ICD-10-CM | POA: Diagnosis not present

## 2014-04-07 DIAGNOSIS — H35039 Hypertensive retinopathy, unspecified eye: Secondary | ICD-10-CM | POA: Diagnosis not present

## 2014-04-08 DIAGNOSIS — L97509 Non-pressure chronic ulcer of other part of unspecified foot with unspecified severity: Secondary | ICD-10-CM | POA: Diagnosis not present

## 2014-04-08 DIAGNOSIS — E1142 Type 2 diabetes mellitus with diabetic polyneuropathy: Secondary | ICD-10-CM | POA: Diagnosis not present

## 2014-04-08 DIAGNOSIS — E1149 Type 2 diabetes mellitus with other diabetic neurological complication: Secondary | ICD-10-CM | POA: Diagnosis not present

## 2014-04-12 DIAGNOSIS — N3941 Urge incontinence: Secondary | ICD-10-CM | POA: Diagnosis not present

## 2014-04-19 DIAGNOSIS — N3941 Urge incontinence: Secondary | ICD-10-CM | POA: Diagnosis not present

## 2014-04-26 DIAGNOSIS — N3941 Urge incontinence: Secondary | ICD-10-CM | POA: Diagnosis not present

## 2014-05-03 DIAGNOSIS — N3941 Urge incontinence: Secondary | ICD-10-CM | POA: Diagnosis not present

## 2014-05-04 DIAGNOSIS — M069 Rheumatoid arthritis, unspecified: Secondary | ICD-10-CM | POA: Diagnosis not present

## 2014-05-10 DIAGNOSIS — R350 Frequency of micturition: Secondary | ICD-10-CM | POA: Diagnosis not present

## 2014-05-10 DIAGNOSIS — N3941 Urge incontinence: Secondary | ICD-10-CM | POA: Diagnosis not present

## 2014-05-17 DIAGNOSIS — N3941 Urge incontinence: Secondary | ICD-10-CM | POA: Diagnosis not present

## 2014-05-31 DIAGNOSIS — N3941 Urge incontinence: Secondary | ICD-10-CM | POA: Diagnosis not present

## 2014-06-01 DIAGNOSIS — M069 Rheumatoid arthritis, unspecified: Secondary | ICD-10-CM | POA: Diagnosis not present

## 2014-06-07 DIAGNOSIS — N3941 Urge incontinence: Secondary | ICD-10-CM | POA: Diagnosis not present

## 2014-06-08 DIAGNOSIS — E1142 Type 2 diabetes mellitus with diabetic polyneuropathy: Secondary | ICD-10-CM | POA: Diagnosis not present

## 2014-06-08 DIAGNOSIS — L97509 Non-pressure chronic ulcer of other part of unspecified foot with unspecified severity: Secondary | ICD-10-CM | POA: Diagnosis not present

## 2014-06-08 DIAGNOSIS — E1149 Type 2 diabetes mellitus with other diabetic neurological complication: Secondary | ICD-10-CM | POA: Diagnosis not present

## 2014-06-14 DIAGNOSIS — N3941 Urge incontinence: Secondary | ICD-10-CM | POA: Diagnosis not present

## 2014-06-21 DIAGNOSIS — N3941 Urge incontinence: Secondary | ICD-10-CM | POA: Diagnosis not present

## 2014-06-28 DIAGNOSIS — R350 Frequency of micturition: Secondary | ICD-10-CM | POA: Diagnosis not present

## 2014-06-28 DIAGNOSIS — N3941 Urge incontinence: Secondary | ICD-10-CM | POA: Diagnosis not present

## 2014-06-29 DIAGNOSIS — M069 Rheumatoid arthritis, unspecified: Secondary | ICD-10-CM | POA: Diagnosis not present

## 2014-06-30 DIAGNOSIS — Z0000 Encounter for general adult medical examination without abnormal findings: Secondary | ICD-10-CM | POA: Diagnosis not present

## 2014-06-30 DIAGNOSIS — E782 Mixed hyperlipidemia: Secondary | ICD-10-CM | POA: Diagnosis not present

## 2014-06-30 DIAGNOSIS — Z139 Encounter for screening, unspecified: Secondary | ICD-10-CM | POA: Diagnosis not present

## 2014-06-30 DIAGNOSIS — Z1331 Encounter for screening for depression: Secondary | ICD-10-CM | POA: Diagnosis not present

## 2014-06-30 DIAGNOSIS — N183 Chronic kidney disease, stage 3 unspecified: Secondary | ICD-10-CM | POA: Diagnosis not present

## 2014-06-30 DIAGNOSIS — I129 Hypertensive chronic kidney disease with stage 1 through stage 4 chronic kidney disease, or unspecified chronic kidney disease: Secondary | ICD-10-CM | POA: Diagnosis not present

## 2014-06-30 DIAGNOSIS — E1149 Type 2 diabetes mellitus with other diabetic neurological complication: Secondary | ICD-10-CM | POA: Diagnosis not present

## 2014-07-06 DIAGNOSIS — E1149 Type 2 diabetes mellitus with other diabetic neurological complication: Secondary | ICD-10-CM | POA: Diagnosis not present

## 2014-07-20 DIAGNOSIS — N3941 Urge incontinence: Secondary | ICD-10-CM | POA: Diagnosis not present

## 2014-07-27 DIAGNOSIS — Z23 Encounter for immunization: Secondary | ICD-10-CM | POA: Diagnosis not present

## 2014-07-27 DIAGNOSIS — M069 Rheumatoid arthritis, unspecified: Secondary | ICD-10-CM | POA: Diagnosis not present

## 2014-08-09 DIAGNOSIS — N3941 Urge incontinence: Secondary | ICD-10-CM | POA: Diagnosis not present

## 2014-08-10 DIAGNOSIS — M069 Rheumatoid arthritis, unspecified: Secondary | ICD-10-CM | POA: Diagnosis not present

## 2014-08-10 DIAGNOSIS — D500 Iron deficiency anemia secondary to blood loss (chronic): Secondary | ICD-10-CM | POA: Diagnosis not present

## 2014-08-10 DIAGNOSIS — D638 Anemia in other chronic diseases classified elsewhere: Secondary | ICD-10-CM | POA: Diagnosis not present

## 2014-08-10 DIAGNOSIS — E119 Type 2 diabetes mellitus without complications: Secondary | ICD-10-CM | POA: Diagnosis not present

## 2014-08-11 DIAGNOSIS — D500 Iron deficiency anemia secondary to blood loss (chronic): Secondary | ICD-10-CM | POA: Diagnosis not present

## 2014-08-11 DIAGNOSIS — M069 Rheumatoid arthritis, unspecified: Secondary | ICD-10-CM | POA: Diagnosis not present

## 2014-08-11 DIAGNOSIS — D638 Anemia in other chronic diseases classified elsewhere: Secondary | ICD-10-CM | POA: Diagnosis not present

## 2014-08-11 DIAGNOSIS — E119 Type 2 diabetes mellitus without complications: Secondary | ICD-10-CM | POA: Diagnosis not present

## 2014-08-12 DIAGNOSIS — D638 Anemia in other chronic diseases classified elsewhere: Secondary | ICD-10-CM | POA: Diagnosis not present

## 2014-08-12 DIAGNOSIS — D500 Iron deficiency anemia secondary to blood loss (chronic): Secondary | ICD-10-CM | POA: Diagnosis not present

## 2014-08-12 DIAGNOSIS — M069 Rheumatoid arthritis, unspecified: Secondary | ICD-10-CM | POA: Diagnosis not present

## 2014-08-12 DIAGNOSIS — E119 Type 2 diabetes mellitus without complications: Secondary | ICD-10-CM | POA: Diagnosis not present

## 2014-08-17 DIAGNOSIS — E1142 Type 2 diabetes mellitus with diabetic polyneuropathy: Secondary | ICD-10-CM | POA: Diagnosis not present

## 2014-08-17 DIAGNOSIS — L97509 Non-pressure chronic ulcer of other part of unspecified foot with unspecified severity: Secondary | ICD-10-CM | POA: Diagnosis not present

## 2014-08-18 DIAGNOSIS — D519 Vitamin B12 deficiency anemia, unspecified: Secondary | ICD-10-CM | POA: Diagnosis not present

## 2014-08-18 DIAGNOSIS — G8929 Other chronic pain: Secondary | ICD-10-CM | POA: Diagnosis not present

## 2014-08-18 DIAGNOSIS — Z23 Encounter for immunization: Secondary | ICD-10-CM | POA: Diagnosis not present

## 2014-08-18 DIAGNOSIS — M549 Dorsalgia, unspecified: Secondary | ICD-10-CM | POA: Diagnosis not present

## 2014-08-19 DIAGNOSIS — D500 Iron deficiency anemia secondary to blood loss (chronic): Secondary | ICD-10-CM | POA: Diagnosis not present

## 2014-08-19 DIAGNOSIS — M069 Rheumatoid arthritis, unspecified: Secondary | ICD-10-CM | POA: Diagnosis not present

## 2014-08-19 DIAGNOSIS — D638 Anemia in other chronic diseases classified elsewhere: Secondary | ICD-10-CM | POA: Diagnosis not present

## 2014-08-19 DIAGNOSIS — E119 Type 2 diabetes mellitus without complications: Secondary | ICD-10-CM | POA: Diagnosis not present

## 2014-08-23 DIAGNOSIS — R05 Cough: Secondary | ICD-10-CM | POA: Diagnosis not present

## 2014-08-23 DIAGNOSIS — Z6834 Body mass index (BMI) 34.0-34.9, adult: Secondary | ICD-10-CM | POA: Diagnosis not present

## 2014-08-23 DIAGNOSIS — R262 Difficulty in walking, not elsewhere classified: Secondary | ICD-10-CM | POA: Diagnosis not present

## 2014-08-24 DIAGNOSIS — M0589 Other rheumatoid arthritis with rheumatoid factor of multiple sites: Secondary | ICD-10-CM | POA: Diagnosis not present

## 2014-08-30 DIAGNOSIS — N3941 Urge incontinence: Secondary | ICD-10-CM | POA: Diagnosis not present

## 2014-09-17 DIAGNOSIS — I509 Heart failure, unspecified: Secondary | ICD-10-CM | POA: Diagnosis not present

## 2014-09-17 DIAGNOSIS — N3000 Acute cystitis without hematuria: Secondary | ICD-10-CM | POA: Diagnosis not present

## 2014-09-17 DIAGNOSIS — E860 Dehydration: Secondary | ICD-10-CM | POA: Diagnosis not present

## 2014-09-17 DIAGNOSIS — E1165 Type 2 diabetes mellitus with hyperglycemia: Secondary | ICD-10-CM | POA: Diagnosis not present

## 2014-09-17 DIAGNOSIS — I4891 Unspecified atrial fibrillation: Secondary | ICD-10-CM | POA: Diagnosis not present

## 2014-09-17 DIAGNOSIS — E1142 Type 2 diabetes mellitus with diabetic polyneuropathy: Secondary | ICD-10-CM | POA: Diagnosis not present

## 2014-09-17 DIAGNOSIS — N171 Acute kidney failure with acute cortical necrosis: Secondary | ICD-10-CM | POA: Diagnosis not present

## 2014-09-17 DIAGNOSIS — D519 Vitamin B12 deficiency anemia, unspecified: Secondary | ICD-10-CM | POA: Diagnosis not present

## 2014-09-18 DIAGNOSIS — D72829 Elevated white blood cell count, unspecified: Secondary | ICD-10-CM | POA: Diagnosis not present

## 2014-09-18 DIAGNOSIS — N179 Acute kidney failure, unspecified: Secondary | ICD-10-CM | POA: Diagnosis not present

## 2014-09-18 DIAGNOSIS — R112 Nausea with vomiting, unspecified: Secondary | ICD-10-CM | POA: Diagnosis not present

## 2014-09-18 DIAGNOSIS — A09 Infectious gastroenteritis and colitis, unspecified: Secondary | ICD-10-CM | POA: Diagnosis not present

## 2014-09-18 DIAGNOSIS — N3000 Acute cystitis without hematuria: Secondary | ICD-10-CM | POA: Diagnosis not present

## 2014-09-18 DIAGNOSIS — N390 Urinary tract infection, site not specified: Secondary | ICD-10-CM | POA: Diagnosis not present

## 2014-09-18 DIAGNOSIS — K529 Noninfective gastroenteritis and colitis, unspecified: Secondary | ICD-10-CM | POA: Diagnosis not present

## 2014-09-18 DIAGNOSIS — R197 Diarrhea, unspecified: Secondary | ICD-10-CM | POA: Diagnosis not present

## 2014-09-18 DIAGNOSIS — R1030 Lower abdominal pain, unspecified: Secondary | ICD-10-CM | POA: Diagnosis not present

## 2014-09-18 DIAGNOSIS — R1110 Vomiting, unspecified: Secondary | ICD-10-CM | POA: Diagnosis not present

## 2014-09-19 DIAGNOSIS — Z79899 Other long term (current) drug therapy: Secondary | ICD-10-CM | POA: Diagnosis not present

## 2014-09-19 DIAGNOSIS — I10 Essential (primary) hypertension: Secondary | ICD-10-CM | POA: Diagnosis present

## 2014-09-19 DIAGNOSIS — M797 Fibromyalgia: Secondary | ICD-10-CM | POA: Diagnosis present

## 2014-09-19 DIAGNOSIS — Z7901 Long term (current) use of anticoagulants: Secondary | ICD-10-CM | POA: Diagnosis not present

## 2014-09-19 DIAGNOSIS — E119 Type 2 diabetes mellitus without complications: Secondary | ICD-10-CM | POA: Diagnosis not present

## 2014-09-19 DIAGNOSIS — B9620 Unspecified Escherichia coli [E. coli] as the cause of diseases classified elsewhere: Secondary | ICD-10-CM | POA: Diagnosis present

## 2014-09-19 DIAGNOSIS — D519 Vitamin B12 deficiency anemia, unspecified: Secondary | ICD-10-CM | POA: Diagnosis not present

## 2014-09-19 DIAGNOSIS — Z87891 Personal history of nicotine dependence: Secondary | ICD-10-CM | POA: Diagnosis not present

## 2014-09-19 DIAGNOSIS — N170 Acute kidney failure with tubular necrosis: Secondary | ICD-10-CM | POA: Diagnosis not present

## 2014-09-19 DIAGNOSIS — N390 Urinary tract infection, site not specified: Secondary | ICD-10-CM | POA: Diagnosis not present

## 2014-09-19 DIAGNOSIS — D72829 Elevated white blood cell count, unspecified: Secondary | ICD-10-CM | POA: Diagnosis not present

## 2014-09-19 DIAGNOSIS — R1030 Lower abdominal pain, unspecified: Secondary | ICD-10-CM | POA: Diagnosis not present

## 2014-09-19 DIAGNOSIS — E780 Pure hypercholesterolemia: Secondary | ICD-10-CM | POA: Diagnosis present

## 2014-09-19 DIAGNOSIS — Z792 Long term (current) use of antibiotics: Secondary | ICD-10-CM | POA: Diagnosis not present

## 2014-09-19 DIAGNOSIS — F329 Major depressive disorder, single episode, unspecified: Secondary | ICD-10-CM | POA: Diagnosis present

## 2014-09-19 DIAGNOSIS — N171 Acute kidney failure with acute cortical necrosis: Secondary | ICD-10-CM | POA: Diagnosis not present

## 2014-09-19 DIAGNOSIS — N179 Acute kidney failure, unspecified: Secondary | ICD-10-CM | POA: Diagnosis not present

## 2014-09-19 DIAGNOSIS — N3000 Acute cystitis without hematuria: Secondary | ICD-10-CM | POA: Diagnosis not present

## 2014-09-19 DIAGNOSIS — D500 Iron deficiency anemia secondary to blood loss (chronic): Secondary | ICD-10-CM | POA: Diagnosis not present

## 2014-09-19 DIAGNOSIS — I509 Heart failure, unspecified: Secondary | ICD-10-CM | POA: Diagnosis not present

## 2014-09-19 DIAGNOSIS — M069 Rheumatoid arthritis, unspecified: Secondary | ICD-10-CM | POA: Diagnosis present

## 2014-09-19 DIAGNOSIS — A09 Infectious gastroenteritis and colitis, unspecified: Secondary | ICD-10-CM | POA: Diagnosis not present

## 2014-09-19 DIAGNOSIS — Z7951 Long term (current) use of inhaled steroids: Secondary | ICD-10-CM | POA: Diagnosis not present

## 2014-09-19 DIAGNOSIS — J439 Emphysema, unspecified: Secondary | ICD-10-CM | POA: Diagnosis present

## 2014-09-19 DIAGNOSIS — Z1624 Resistance to multiple antibiotics: Secondary | ICD-10-CM | POA: Diagnosis present

## 2014-09-19 DIAGNOSIS — E1142 Type 2 diabetes mellitus with diabetic polyneuropathy: Secondary | ICD-10-CM | POA: Diagnosis not present

## 2014-09-19 DIAGNOSIS — E860 Dehydration: Secondary | ICD-10-CM | POA: Diagnosis not present

## 2014-09-19 DIAGNOSIS — K219 Gastro-esophageal reflux disease without esophagitis: Secondary | ICD-10-CM | POA: Diagnosis present

## 2014-09-19 DIAGNOSIS — K529 Noninfective gastroenteritis and colitis, unspecified: Secondary | ICD-10-CM | POA: Diagnosis not present

## 2014-09-19 DIAGNOSIS — R112 Nausea with vomiting, unspecified: Secondary | ICD-10-CM | POA: Diagnosis not present

## 2014-09-19 DIAGNOSIS — R1110 Vomiting, unspecified: Secondary | ICD-10-CM | POA: Diagnosis not present

## 2014-09-19 DIAGNOSIS — R197 Diarrhea, unspecified: Secondary | ICD-10-CM | POA: Diagnosis not present

## 2014-09-19 DIAGNOSIS — I4891 Unspecified atrial fibrillation: Secondary | ICD-10-CM | POA: Diagnosis present

## 2014-09-19 DIAGNOSIS — E1165 Type 2 diabetes mellitus with hyperglycemia: Secondary | ICD-10-CM | POA: Diagnosis not present

## 2014-10-04 DIAGNOSIS — D631 Anemia in chronic kidney disease: Secondary | ICD-10-CM | POA: Diagnosis not present

## 2014-10-04 DIAGNOSIS — K529 Noninfective gastroenteritis and colitis, unspecified: Secondary | ICD-10-CM | POA: Diagnosis not present

## 2014-10-04 DIAGNOSIS — D500 Iron deficiency anemia secondary to blood loss (chronic): Secondary | ICD-10-CM | POA: Diagnosis not present

## 2014-10-04 DIAGNOSIS — N189 Chronic kidney disease, unspecified: Secondary | ICD-10-CM | POA: Diagnosis not present

## 2014-10-05 DIAGNOSIS — M0589 Other rheumatoid arthritis with rheumatoid factor of multiple sites: Secondary | ICD-10-CM | POA: Diagnosis not present

## 2014-10-05 DIAGNOSIS — M0540 Rheumatoid myopathy with rheumatoid arthritis of unspecified site: Secondary | ICD-10-CM | POA: Diagnosis not present

## 2014-10-18 DIAGNOSIS — N3941 Urge incontinence: Secondary | ICD-10-CM | POA: Diagnosis not present

## 2014-10-19 DIAGNOSIS — E1142 Type 2 diabetes mellitus with diabetic polyneuropathy: Secondary | ICD-10-CM | POA: Diagnosis not present

## 2014-10-19 DIAGNOSIS — M2042 Other hammer toe(s) (acquired), left foot: Secondary | ICD-10-CM | POA: Diagnosis not present

## 2014-10-19 DIAGNOSIS — M2041 Other hammer toe(s) (acquired), right foot: Secondary | ICD-10-CM | POA: Diagnosis not present

## 2014-10-20 DIAGNOSIS — I129 Hypertensive chronic kidney disease with stage 1 through stage 4 chronic kidney disease, or unspecified chronic kidney disease: Secondary | ICD-10-CM | POA: Diagnosis not present

## 2014-10-20 DIAGNOSIS — E1149 Type 2 diabetes mellitus with other diabetic neurological complication: Secondary | ICD-10-CM | POA: Diagnosis not present

## 2014-10-20 DIAGNOSIS — N183 Chronic kidney disease, stage 3 (moderate): Secondary | ICD-10-CM | POA: Diagnosis not present

## 2014-10-20 DIAGNOSIS — E782 Mixed hyperlipidemia: Secondary | ICD-10-CM | POA: Diagnosis not present

## 2014-10-20 DIAGNOSIS — D519 Vitamin B12 deficiency anemia, unspecified: Secondary | ICD-10-CM | POA: Diagnosis not present

## 2014-11-02 DIAGNOSIS — M0589 Other rheumatoid arthritis with rheumatoid factor of multiple sites: Secondary | ICD-10-CM | POA: Diagnosis not present

## 2014-11-03 DIAGNOSIS — M150 Primary generalized (osteo)arthritis: Secondary | ICD-10-CM | POA: Diagnosis not present

## 2014-11-03 DIAGNOSIS — M797 Fibromyalgia: Secondary | ICD-10-CM | POA: Diagnosis not present

## 2014-11-03 DIAGNOSIS — M79643 Pain in unspecified hand: Secondary | ICD-10-CM | POA: Diagnosis not present

## 2014-11-03 DIAGNOSIS — M0589 Other rheumatoid arthritis with rheumatoid factor of multiple sites: Secondary | ICD-10-CM | POA: Diagnosis not present

## 2014-11-04 DIAGNOSIS — Z1231 Encounter for screening mammogram for malignant neoplasm of breast: Secondary | ICD-10-CM | POA: Diagnosis not present

## 2014-11-08 DIAGNOSIS — N3941 Urge incontinence: Secondary | ICD-10-CM | POA: Diagnosis not present

## 2014-11-17 DIAGNOSIS — M069 Rheumatoid arthritis, unspecified: Secondary | ICD-10-CM | POA: Diagnosis not present

## 2014-11-17 DIAGNOSIS — E119 Type 2 diabetes mellitus without complications: Secondary | ICD-10-CM | POA: Diagnosis not present

## 2014-11-17 DIAGNOSIS — D500 Iron deficiency anemia secondary to blood loss (chronic): Secondary | ICD-10-CM | POA: Diagnosis not present

## 2014-11-17 DIAGNOSIS — D638 Anemia in other chronic diseases classified elsewhere: Secondary | ICD-10-CM | POA: Diagnosis not present

## 2014-11-29 DIAGNOSIS — N3941 Urge incontinence: Secondary | ICD-10-CM | POA: Diagnosis not present

## 2014-11-30 DIAGNOSIS — M0540 Rheumatoid myopathy with rheumatoid arthritis of unspecified site: Secondary | ICD-10-CM | POA: Diagnosis not present

## 2014-11-30 DIAGNOSIS — M0589 Other rheumatoid arthritis with rheumatoid factor of multiple sites: Secondary | ICD-10-CM | POA: Diagnosis not present

## 2014-12-02 DIAGNOSIS — E119 Type 2 diabetes mellitus without complications: Secondary | ICD-10-CM | POA: Diagnosis not present

## 2014-12-02 DIAGNOSIS — D500 Iron deficiency anemia secondary to blood loss (chronic): Secondary | ICD-10-CM | POA: Diagnosis not present

## 2014-12-02 DIAGNOSIS — D638 Anemia in other chronic diseases classified elsewhere: Secondary | ICD-10-CM | POA: Diagnosis not present

## 2014-12-02 DIAGNOSIS — M069 Rheumatoid arthritis, unspecified: Secondary | ICD-10-CM | POA: Diagnosis not present

## 2014-12-07 DIAGNOSIS — E119 Type 2 diabetes mellitus without complications: Secondary | ICD-10-CM | POA: Diagnosis not present

## 2014-12-07 DIAGNOSIS — H532 Diplopia: Secondary | ICD-10-CM | POA: Diagnosis not present

## 2014-12-07 DIAGNOSIS — H35033 Hypertensive retinopathy, bilateral: Secondary | ICD-10-CM | POA: Diagnosis not present

## 2014-12-07 DIAGNOSIS — H524 Presbyopia: Secondary | ICD-10-CM | POA: Diagnosis not present

## 2014-12-09 DIAGNOSIS — M069 Rheumatoid arthritis, unspecified: Secondary | ICD-10-CM | POA: Diagnosis not present

## 2014-12-09 DIAGNOSIS — E119 Type 2 diabetes mellitus without complications: Secondary | ICD-10-CM | POA: Diagnosis not present

## 2014-12-09 DIAGNOSIS — D638 Anemia in other chronic diseases classified elsewhere: Secondary | ICD-10-CM | POA: Diagnosis not present

## 2014-12-09 DIAGNOSIS — D500 Iron deficiency anemia secondary to blood loss (chronic): Secondary | ICD-10-CM | POA: Diagnosis not present

## 2014-12-23 DIAGNOSIS — N3941 Urge incontinence: Secondary | ICD-10-CM | POA: Diagnosis not present

## 2014-12-28 DIAGNOSIS — M0589 Other rheumatoid arthritis with rheumatoid factor of multiple sites: Secondary | ICD-10-CM | POA: Diagnosis not present

## 2015-01-04 DIAGNOSIS — E1149 Type 2 diabetes mellitus with other diabetic neurological complication: Secondary | ICD-10-CM | POA: Diagnosis not present

## 2015-01-04 DIAGNOSIS — E782 Mixed hyperlipidemia: Secondary | ICD-10-CM | POA: Diagnosis not present

## 2015-01-10 DIAGNOSIS — N3941 Urge incontinence: Secondary | ICD-10-CM | POA: Diagnosis not present

## 2015-01-18 DIAGNOSIS — E1149 Type 2 diabetes mellitus with other diabetic neurological complication: Secondary | ICD-10-CM | POA: Diagnosis not present

## 2015-01-18 DIAGNOSIS — I129 Hypertensive chronic kidney disease with stage 1 through stage 4 chronic kidney disease, or unspecified chronic kidney disease: Secondary | ICD-10-CM | POA: Diagnosis not present

## 2015-01-18 DIAGNOSIS — E782 Mixed hyperlipidemia: Secondary | ICD-10-CM | POA: Diagnosis not present

## 2015-01-18 DIAGNOSIS — E1169 Type 2 diabetes mellitus with other specified complication: Secondary | ICD-10-CM | POA: Diagnosis not present

## 2015-01-19 DIAGNOSIS — D518 Other vitamin B12 deficiency anemias: Secondary | ICD-10-CM | POA: Diagnosis not present

## 2015-01-19 DIAGNOSIS — D638 Anemia in other chronic diseases classified elsewhere: Secondary | ICD-10-CM | POA: Diagnosis not present

## 2015-01-19 DIAGNOSIS — D500 Iron deficiency anemia secondary to blood loss (chronic): Secondary | ICD-10-CM | POA: Diagnosis not present

## 2015-01-19 DIAGNOSIS — E119 Type 2 diabetes mellitus without complications: Secondary | ICD-10-CM | POA: Diagnosis not present

## 2015-01-19 DIAGNOSIS — M069 Rheumatoid arthritis, unspecified: Secondary | ICD-10-CM | POA: Diagnosis not present

## 2015-01-25 DIAGNOSIS — M0589 Other rheumatoid arthritis with rheumatoid factor of multiple sites: Secondary | ICD-10-CM | POA: Diagnosis not present

## 2015-02-09 DIAGNOSIS — J0100 Acute maxillary sinusitis, unspecified: Secondary | ICD-10-CM | POA: Diagnosis not present

## 2015-02-10 DIAGNOSIS — E1142 Type 2 diabetes mellitus with diabetic polyneuropathy: Secondary | ICD-10-CM | POA: Diagnosis not present

## 2015-02-10 DIAGNOSIS — E11621 Type 2 diabetes mellitus with foot ulcer: Secondary | ICD-10-CM | POA: Diagnosis not present

## 2015-02-10 DIAGNOSIS — L97529 Non-pressure chronic ulcer of other part of left foot with unspecified severity: Secondary | ICD-10-CM | POA: Diagnosis not present

## 2015-02-17 DIAGNOSIS — E11621 Type 2 diabetes mellitus with foot ulcer: Secondary | ICD-10-CM | POA: Diagnosis not present

## 2015-02-17 DIAGNOSIS — E1142 Type 2 diabetes mellitus with diabetic polyneuropathy: Secondary | ICD-10-CM | POA: Diagnosis not present

## 2015-02-17 DIAGNOSIS — L97509 Non-pressure chronic ulcer of other part of unspecified foot with unspecified severity: Secondary | ICD-10-CM | POA: Diagnosis not present

## 2015-02-22 DIAGNOSIS — M0589 Other rheumatoid arthritis with rheumatoid factor of multiple sites: Secondary | ICD-10-CM | POA: Diagnosis not present

## 2015-02-28 DIAGNOSIS — N3941 Urge incontinence: Secondary | ICD-10-CM | POA: Diagnosis not present

## 2015-03-21 DIAGNOSIS — N3941 Urge incontinence: Secondary | ICD-10-CM | POA: Diagnosis not present

## 2015-03-22 DIAGNOSIS — M0589 Other rheumatoid arthritis with rheumatoid factor of multiple sites: Secondary | ICD-10-CM | POA: Diagnosis not present

## 2015-03-30 DIAGNOSIS — D638 Anemia in other chronic diseases classified elsewhere: Secondary | ICD-10-CM | POA: Diagnosis not present

## 2015-03-30 DIAGNOSIS — D518 Other vitamin B12 deficiency anemias: Secondary | ICD-10-CM | POA: Diagnosis not present

## 2015-03-30 DIAGNOSIS — M069 Rheumatoid arthritis, unspecified: Secondary | ICD-10-CM | POA: Diagnosis not present

## 2015-03-30 DIAGNOSIS — D500 Iron deficiency anemia secondary to blood loss (chronic): Secondary | ICD-10-CM | POA: Diagnosis not present

## 2015-03-30 DIAGNOSIS — E119 Type 2 diabetes mellitus without complications: Secondary | ICD-10-CM | POA: Diagnosis not present

## 2015-04-11 DIAGNOSIS — N3941 Urge incontinence: Secondary | ICD-10-CM | POA: Diagnosis not present

## 2015-04-20 DIAGNOSIS — M0589 Other rheumatoid arthritis with rheumatoid factor of multiple sites: Secondary | ICD-10-CM | POA: Diagnosis not present

## 2015-04-28 NOTE — Patient Outreach (Signed)
[facility]) Care Management  04/28/2015  Gloria Best Nov 17, 1940 [id number]   Referral from [facility], assigned Tomasa Rand, RN.  [name]. [place], [place] Management [place] Assistant Phone: [phone] Fax: [phone]

## 2015-05-02 DIAGNOSIS — N3941 Urge incontinence: Secondary | ICD-10-CM | POA: Diagnosis not present

## 2015-05-12 DIAGNOSIS — L97511 Non-pressure chronic ulcer of other part of right foot limited to breakdown of skin: Secondary | ICD-10-CM | POA: Diagnosis not present

## 2015-05-12 DIAGNOSIS — E1142 Type 2 diabetes mellitus with diabetic polyneuropathy: Secondary | ICD-10-CM | POA: Diagnosis not present

## 2015-05-12 DIAGNOSIS — M069 Rheumatoid arthritis, unspecified: Secondary | ICD-10-CM | POA: Diagnosis not present

## 2015-05-12 DIAGNOSIS — L97521 Non-pressure chronic ulcer of other part of left foot limited to breakdown of skin: Secondary | ICD-10-CM | POA: Diagnosis not present

## 2015-05-12 DIAGNOSIS — E11621 Type 2 diabetes mellitus with foot ulcer: Secondary | ICD-10-CM | POA: Diagnosis not present

## 2015-05-16 DIAGNOSIS — E1149 Type 2 diabetes mellitus with other diabetic neurological complication: Secondary | ICD-10-CM | POA: Diagnosis not present

## 2015-05-16 DIAGNOSIS — E1169 Type 2 diabetes mellitus with other specified complication: Secondary | ICD-10-CM | POA: Diagnosis not present

## 2015-05-27 DIAGNOSIS — M0589 Other rheumatoid arthritis with rheumatoid factor of multiple sites: Secondary | ICD-10-CM | POA: Diagnosis not present

## 2015-05-30 DIAGNOSIS — E1169 Type 2 diabetes mellitus with other specified complication: Secondary | ICD-10-CM | POA: Diagnosis not present

## 2015-05-30 DIAGNOSIS — N183 Chronic kidney disease, stage 3 (moderate): Secondary | ICD-10-CM | POA: Diagnosis not present

## 2015-05-30 DIAGNOSIS — E1149 Type 2 diabetes mellitus with other diabetic neurological complication: Secondary | ICD-10-CM | POA: Diagnosis not present

## 2015-05-30 DIAGNOSIS — I129 Hypertensive chronic kidney disease with stage 1 through stage 4 chronic kidney disease, or unspecified chronic kidney disease: Secondary | ICD-10-CM | POA: Diagnosis not present

## 2015-05-31 DIAGNOSIS — E11621 Type 2 diabetes mellitus with foot ulcer: Secondary | ICD-10-CM | POA: Diagnosis not present

## 2015-05-31 DIAGNOSIS — E1142 Type 2 diabetes mellitus with diabetic polyneuropathy: Secondary | ICD-10-CM | POA: Diagnosis not present

## 2015-05-31 DIAGNOSIS — L97511 Non-pressure chronic ulcer of other part of right foot limited to breakdown of skin: Secondary | ICD-10-CM | POA: Diagnosis not present

## 2015-05-31 DIAGNOSIS — L97521 Non-pressure chronic ulcer of other part of left foot limited to breakdown of skin: Secondary | ICD-10-CM | POA: Diagnosis not present

## 2015-06-06 DIAGNOSIS — N3941 Urge incontinence: Secondary | ICD-10-CM | POA: Diagnosis not present

## 2015-06-14 DIAGNOSIS — E11621 Type 2 diabetes mellitus with foot ulcer: Secondary | ICD-10-CM | POA: Diagnosis not present

## 2015-06-14 DIAGNOSIS — E1142 Type 2 diabetes mellitus with diabetic polyneuropathy: Secondary | ICD-10-CM | POA: Diagnosis not present

## 2015-06-14 DIAGNOSIS — M069 Rheumatoid arthritis, unspecified: Secondary | ICD-10-CM | POA: Diagnosis not present

## 2015-06-14 DIAGNOSIS — L97521 Non-pressure chronic ulcer of other part of left foot limited to breakdown of skin: Secondary | ICD-10-CM | POA: Diagnosis not present

## 2015-06-14 DIAGNOSIS — L97511 Non-pressure chronic ulcer of other part of right foot limited to breakdown of skin: Secondary | ICD-10-CM | POA: Diagnosis not present

## 2015-06-24 DIAGNOSIS — M0589 Other rheumatoid arthritis with rheumatoid factor of multiple sites: Secondary | ICD-10-CM | POA: Diagnosis not present

## 2015-06-27 DIAGNOSIS — N3941 Urge incontinence: Secondary | ICD-10-CM | POA: Diagnosis not present

## 2015-06-30 DIAGNOSIS — D518 Other vitamin B12 deficiency anemias: Secondary | ICD-10-CM | POA: Diagnosis not present

## 2015-06-30 DIAGNOSIS — D509 Iron deficiency anemia, unspecified: Secondary | ICD-10-CM | POA: Diagnosis not present

## 2015-06-30 DIAGNOSIS — D638 Anemia in other chronic diseases classified elsewhere: Secondary | ICD-10-CM | POA: Diagnosis not present

## 2015-06-30 DIAGNOSIS — M069 Rheumatoid arthritis, unspecified: Secondary | ICD-10-CM | POA: Diagnosis not present

## 2015-07-18 DIAGNOSIS — N3941 Urge incontinence: Secondary | ICD-10-CM | POA: Diagnosis not present

## 2015-07-19 DIAGNOSIS — M0589 Other rheumatoid arthritis with rheumatoid factor of multiple sites: Secondary | ICD-10-CM | POA: Diagnosis not present

## 2015-08-08 DIAGNOSIS — N3941 Urge incontinence: Secondary | ICD-10-CM | POA: Diagnosis not present

## 2015-08-11 DIAGNOSIS — M11261 Other chondrocalcinosis, right knee: Secondary | ICD-10-CM | POA: Diagnosis not present

## 2015-08-16 DIAGNOSIS — M0589 Other rheumatoid arthritis with rheumatoid factor of multiple sites: Secondary | ICD-10-CM | POA: Diagnosis not present

## 2015-08-18 DIAGNOSIS — E1142 Type 2 diabetes mellitus with diabetic polyneuropathy: Secondary | ICD-10-CM | POA: Diagnosis not present

## 2015-08-18 DIAGNOSIS — E11621 Type 2 diabetes mellitus with foot ulcer: Secondary | ICD-10-CM | POA: Diagnosis not present

## 2015-08-18 DIAGNOSIS — L97521 Non-pressure chronic ulcer of other part of left foot limited to breakdown of skin: Secondary | ICD-10-CM | POA: Diagnosis not present

## 2015-08-23 DIAGNOSIS — R7612 Nonspecific reaction to cell mediated immunity measurement of gamma interferon antigen response without active tuberculosis: Secondary | ICD-10-CM | POA: Diagnosis not present

## 2015-08-23 DIAGNOSIS — M0579 Rheumatoid arthritis with rheumatoid factor of multiple sites without organ or systems involvement: Secondary | ICD-10-CM | POA: Diagnosis not present

## 2015-08-23 DIAGNOSIS — M797 Fibromyalgia: Secondary | ICD-10-CM | POA: Diagnosis not present

## 2015-08-23 DIAGNOSIS — M150 Primary generalized (osteo)arthritis: Secondary | ICD-10-CM | POA: Diagnosis not present

## 2015-08-29 DIAGNOSIS — N3941 Urge incontinence: Secondary | ICD-10-CM | POA: Diagnosis not present

## 2015-09-13 DIAGNOSIS — M0589 Other rheumatoid arthritis with rheumatoid factor of multiple sites: Secondary | ICD-10-CM | POA: Diagnosis not present

## 2015-09-13 DIAGNOSIS — Z23 Encounter for immunization: Secondary | ICD-10-CM | POA: Diagnosis not present

## 2015-09-19 DIAGNOSIS — R05 Cough: Secondary | ICD-10-CM | POA: Diagnosis not present

## 2015-09-19 DIAGNOSIS — R7612 Nonspecific reaction to cell mediated immunity measurement of gamma interferon antigen response without active tuberculosis: Secondary | ICD-10-CM | POA: Diagnosis not present

## 2015-09-19 DIAGNOSIS — Z6833 Body mass index (BMI) 33.0-33.9, adult: Secondary | ICD-10-CM | POA: Diagnosis not present

## 2015-09-26 DIAGNOSIS — I129 Hypertensive chronic kidney disease with stage 1 through stage 4 chronic kidney disease, or unspecified chronic kidney disease: Secondary | ICD-10-CM | POA: Diagnosis not present

## 2015-09-26 DIAGNOSIS — E1149 Type 2 diabetes mellitus with other diabetic neurological complication: Secondary | ICD-10-CM | POA: Diagnosis not present

## 2015-09-26 DIAGNOSIS — D649 Anemia, unspecified: Secondary | ICD-10-CM | POA: Diagnosis not present

## 2015-10-03 DIAGNOSIS — E1149 Type 2 diabetes mellitus with other diabetic neurological complication: Secondary | ICD-10-CM | POA: Diagnosis not present

## 2015-10-03 DIAGNOSIS — E1169 Type 2 diabetes mellitus with other specified complication: Secondary | ICD-10-CM | POA: Diagnosis not present

## 2015-10-03 DIAGNOSIS — I129 Hypertensive chronic kidney disease with stage 1 through stage 4 chronic kidney disease, or unspecified chronic kidney disease: Secondary | ICD-10-CM | POA: Diagnosis not present

## 2015-10-03 DIAGNOSIS — N183 Chronic kidney disease, stage 3 (moderate): Secondary | ICD-10-CM | POA: Diagnosis not present

## 2015-10-03 NOTE — Patient Outreach (Signed)
[facility]) Care Management  10/03/2015  ADRITA MEYERHOFF 1941/04/29 [id number]   Referral sent to Tomasa Rand, RN from [facility] List.  Thanks, Lurline Del

## 2015-10-04 ENCOUNTER — Other Ambulatory Visit: Payer: Self-pay

## 2015-10-04 DIAGNOSIS — R350 Frequency of micturition: Secondary | ICD-10-CM | POA: Diagnosis not present

## 2015-10-04 DIAGNOSIS — N3941 Urge incontinence: Secondary | ICD-10-CM | POA: Diagnosis not present

## 2015-10-04 NOTE — Patient Outreach (Signed)
Screening attempt: New referral for high risk screening:   Placed call to home number. No answer. No machine. Placed call to mobile number which is not in service.  Plan: Will continue to attempt outreach for interest in [place], RN, BSN, [place] Coordinator [phone]

## 2015-10-05 ENCOUNTER — Other Ambulatory Visit: Payer: Self-pay

## 2015-10-05 NOTE — Patient Outreach (Signed)
High risk screening outreach: Second attempt to reach patient- successful. Explained purpose of call and patient reports that she is doing well. Reports to me that her diabetes is under good control. States that she just had a check up with Dr. Marco Collie.  Report no recent falls. Denies any concerns at this time. Reports that she is managing well with her medications.  Patient reports that she might need help in the future. Provided office contact number. Patient reports that she might needs a caregiver at some time. Reports that she has [employer]. Suggested to patient to reach out to the local [place] office in [place] to inquire about spousal benefits.  At this time patient declines services but will call [facility] in the future if she needs help.  Tomasa Rand, RN, BSN, CEN [facility] [employer] [phone]

## 2015-10-11 DIAGNOSIS — M0589 Other rheumatoid arthritis with rheumatoid factor of multiple sites: Secondary | ICD-10-CM | POA: Diagnosis not present

## 2015-10-18 DIAGNOSIS — L97521 Non-pressure chronic ulcer of other part of left foot limited to breakdown of skin: Secondary | ICD-10-CM | POA: Diagnosis not present

## 2015-10-18 DIAGNOSIS — E1142 Type 2 diabetes mellitus with diabetic polyneuropathy: Secondary | ICD-10-CM | POA: Diagnosis not present

## 2015-10-18 DIAGNOSIS — E11621 Type 2 diabetes mellitus with foot ulcer: Secondary | ICD-10-CM | POA: Diagnosis not present

## 2015-10-25 DIAGNOSIS — E11621 Type 2 diabetes mellitus with foot ulcer: Secondary | ICD-10-CM | POA: Diagnosis not present

## 2015-10-25 DIAGNOSIS — L97521 Non-pressure chronic ulcer of other part of left foot limited to breakdown of skin: Secondary | ICD-10-CM | POA: Diagnosis not present

## 2015-10-25 DIAGNOSIS — E1142 Type 2 diabetes mellitus with diabetic polyneuropathy: Secondary | ICD-10-CM | POA: Diagnosis not present

## 2015-10-26 DIAGNOSIS — D500 Iron deficiency anemia secondary to blood loss (chronic): Secondary | ICD-10-CM | POA: Diagnosis not present

## 2015-10-27 DIAGNOSIS — D500 Iron deficiency anemia secondary to blood loss (chronic): Secondary | ICD-10-CM | POA: Diagnosis not present

## 2015-10-28 DIAGNOSIS — D509 Iron deficiency anemia, unspecified: Secondary | ICD-10-CM | POA: Diagnosis not present

## 2015-11-02 DIAGNOSIS — D509 Iron deficiency anemia, unspecified: Secondary | ICD-10-CM | POA: Diagnosis not present

## 2015-11-08 DIAGNOSIS — N3941 Urge incontinence: Secondary | ICD-10-CM | POA: Diagnosis not present

## 2015-11-09 DIAGNOSIS — M0589 Other rheumatoid arthritis with rheumatoid factor of multiple sites: Secondary | ICD-10-CM | POA: Diagnosis not present

## 2015-11-24 DIAGNOSIS — E1142 Type 2 diabetes mellitus with diabetic polyneuropathy: Secondary | ICD-10-CM | POA: Diagnosis not present

## 2015-11-24 DIAGNOSIS — M2041 Other hammer toe(s) (acquired), right foot: Secondary | ICD-10-CM | POA: Diagnosis not present

## 2015-12-05 DIAGNOSIS — N189 Chronic kidney disease, unspecified: Secondary | ICD-10-CM | POA: Diagnosis not present

## 2015-12-05 DIAGNOSIS — D638 Anemia in other chronic diseases classified elsewhere: Secondary | ICD-10-CM | POA: Diagnosis not present

## 2015-12-05 DIAGNOSIS — R0609 Other forms of dyspnea: Secondary | ICD-10-CM

## 2015-12-05 DIAGNOSIS — R5382 Chronic fatigue, unspecified: Secondary | ICD-10-CM

## 2015-12-05 DIAGNOSIS — D519 Vitamin B12 deficiency anemia, unspecified: Secondary | ICD-10-CM | POA: Diagnosis not present

## 2015-12-05 DIAGNOSIS — D509 Iron deficiency anemia, unspecified: Secondary | ICD-10-CM | POA: Diagnosis not present

## 2015-12-05 DIAGNOSIS — E538 Deficiency of other specified B group vitamins: Secondary | ICD-10-CM

## 2015-12-05 DIAGNOSIS — M069 Rheumatoid arthritis, unspecified: Secondary | ICD-10-CM

## 2015-12-05 DIAGNOSIS — I4891 Unspecified atrial fibrillation: Secondary | ICD-10-CM | POA: Diagnosis not present

## 2015-12-05 DIAGNOSIS — D72829 Elevated white blood cell count, unspecified: Secondary | ICD-10-CM | POA: Diagnosis not present

## 2015-12-07 DIAGNOSIS — M0589 Other rheumatoid arthritis with rheumatoid factor of multiple sites: Secondary | ICD-10-CM | POA: Diagnosis not present

## 2015-12-27 DIAGNOSIS — N3941 Urge incontinence: Secondary | ICD-10-CM | POA: Diagnosis not present

## 2016-01-03 DIAGNOSIS — E1142 Type 2 diabetes mellitus with diabetic polyneuropathy: Secondary | ICD-10-CM | POA: Insufficient documentation

## 2016-01-03 DIAGNOSIS — L97521 Non-pressure chronic ulcer of other part of left foot limited to breakdown of skin: Secondary | ICD-10-CM | POA: Diagnosis not present

## 2016-01-04 DIAGNOSIS — M0589 Other rheumatoid arthritis with rheumatoid factor of multiple sites: Secondary | ICD-10-CM | POA: Diagnosis not present

## 2016-01-04 DIAGNOSIS — Z79899 Other long term (current) drug therapy: Secondary | ICD-10-CM | POA: Diagnosis not present

## 2016-01-19 DIAGNOSIS — M11262 Other chondrocalcinosis, left knee: Secondary | ICD-10-CM | POA: Diagnosis not present

## 2016-01-19 DIAGNOSIS — M11261 Other chondrocalcinosis, right knee: Secondary | ICD-10-CM | POA: Diagnosis not present

## 2016-01-24 DIAGNOSIS — E1169 Type 2 diabetes mellitus with other specified complication: Secondary | ICD-10-CM | POA: Diagnosis not present

## 2016-01-24 DIAGNOSIS — D649 Anemia, unspecified: Secondary | ICD-10-CM | POA: Diagnosis not present

## 2016-01-24 DIAGNOSIS — I129 Hypertensive chronic kidney disease with stage 1 through stage 4 chronic kidney disease, or unspecified chronic kidney disease: Secondary | ICD-10-CM | POA: Diagnosis not present

## 2016-01-24 DIAGNOSIS — E1149 Type 2 diabetes mellitus with other diabetic neurological complication: Secondary | ICD-10-CM | POA: Diagnosis not present

## 2016-01-31 DIAGNOSIS — N183 Chronic kidney disease, stage 3 (moderate): Secondary | ICD-10-CM | POA: Diagnosis not present

## 2016-01-31 DIAGNOSIS — E1169 Type 2 diabetes mellitus with other specified complication: Secondary | ICD-10-CM | POA: Diagnosis not present

## 2016-01-31 DIAGNOSIS — I129 Hypertensive chronic kidney disease with stage 1 through stage 4 chronic kidney disease, or unspecified chronic kidney disease: Secondary | ICD-10-CM | POA: Diagnosis not present

## 2016-01-31 DIAGNOSIS — E1149 Type 2 diabetes mellitus with other diabetic neurological complication: Secondary | ICD-10-CM | POA: Diagnosis not present

## 2016-01-31 DIAGNOSIS — Z6833 Body mass index (BMI) 33.0-33.9, adult: Secondary | ICD-10-CM | POA: Diagnosis not present

## 2016-02-01 DIAGNOSIS — M0589 Other rheumatoid arthritis with rheumatoid factor of multiple sites: Secondary | ICD-10-CM | POA: Diagnosis not present

## 2016-02-06 DIAGNOSIS — E119 Type 2 diabetes mellitus without complications: Secondary | ICD-10-CM | POA: Diagnosis not present

## 2016-02-06 DIAGNOSIS — H35033 Hypertensive retinopathy, bilateral: Secondary | ICD-10-CM | POA: Diagnosis not present

## 2016-02-09 DIAGNOSIS — M170 Bilateral primary osteoarthritis of knee: Secondary | ICD-10-CM | POA: Diagnosis not present

## 2016-02-14 DIAGNOSIS — N189 Chronic kidney disease, unspecified: Secondary | ICD-10-CM | POA: Diagnosis not present

## 2016-02-14 DIAGNOSIS — D631 Anemia in chronic kidney disease: Secondary | ICD-10-CM | POA: Diagnosis not present

## 2016-02-21 DIAGNOSIS — M25551 Pain in right hip: Secondary | ICD-10-CM | POA: Diagnosis not present

## 2016-02-21 DIAGNOSIS — M150 Primary generalized (osteo)arthritis: Secondary | ICD-10-CM | POA: Diagnosis not present

## 2016-02-21 DIAGNOSIS — M797 Fibromyalgia: Secondary | ICD-10-CM | POA: Diagnosis not present

## 2016-02-21 DIAGNOSIS — R7612 Nonspecific reaction to cell mediated immunity measurement of gamma interferon antigen response without active tuberculosis: Secondary | ICD-10-CM | POA: Diagnosis not present

## 2016-02-21 DIAGNOSIS — M0579 Rheumatoid arthritis with rheumatoid factor of multiple sites without organ or systems involvement: Secondary | ICD-10-CM | POA: Diagnosis not present

## 2016-02-28 DIAGNOSIS — M0589 Other rheumatoid arthritis with rheumatoid factor of multiple sites: Secondary | ICD-10-CM | POA: Diagnosis not present

## 2016-03-01 DIAGNOSIS — E1142 Type 2 diabetes mellitus with diabetic polyneuropathy: Secondary | ICD-10-CM | POA: Diagnosis not present

## 2016-03-01 DIAGNOSIS — L97521 Non-pressure chronic ulcer of other part of left foot limited to breakdown of skin: Secondary | ICD-10-CM | POA: Diagnosis not present

## 2016-03-09 DIAGNOSIS — N3941 Urge incontinence: Secondary | ICD-10-CM | POA: Diagnosis not present

## 2016-03-22 DIAGNOSIS — M11261 Other chondrocalcinosis, right knee: Secondary | ICD-10-CM | POA: Diagnosis not present

## 2016-03-22 DIAGNOSIS — Z6833 Body mass index (BMI) 33.0-33.9, adult: Secondary | ICD-10-CM | POA: Diagnosis not present

## 2016-03-22 DIAGNOSIS — M11262 Other chondrocalcinosis, left knee: Secondary | ICD-10-CM | POA: Diagnosis not present

## 2016-03-22 DIAGNOSIS — M170 Bilateral primary osteoarthritis of knee: Secondary | ICD-10-CM | POA: Diagnosis not present

## 2016-03-22 DIAGNOSIS — R05 Cough: Secondary | ICD-10-CM | POA: Diagnosis not present

## 2016-03-22 DIAGNOSIS — J441 Chronic obstructive pulmonary disease with (acute) exacerbation: Secondary | ICD-10-CM | POA: Diagnosis not present

## 2016-04-03 DIAGNOSIS — N3941 Urge incontinence: Secondary | ICD-10-CM | POA: Diagnosis not present

## 2016-04-04 DIAGNOSIS — M0589 Other rheumatoid arthritis with rheumatoid factor of multiple sites: Secondary | ICD-10-CM | POA: Diagnosis not present

## 2016-04-12 DIAGNOSIS — E1149 Type 2 diabetes mellitus with other diabetic neurological complication: Secondary | ICD-10-CM | POA: Diagnosis not present

## 2016-04-12 DIAGNOSIS — E1169 Type 2 diabetes mellitus with other specified complication: Secondary | ICD-10-CM | POA: Diagnosis not present

## 2016-04-12 DIAGNOSIS — I129 Hypertensive chronic kidney disease with stage 1 through stage 4 chronic kidney disease, or unspecified chronic kidney disease: Secondary | ICD-10-CM | POA: Diagnosis not present

## 2016-04-12 DIAGNOSIS — Z01818 Encounter for other preprocedural examination: Secondary | ICD-10-CM | POA: Diagnosis not present

## 2016-04-12 DIAGNOSIS — D649 Anemia, unspecified: Secondary | ICD-10-CM | POA: Diagnosis not present

## 2016-04-12 DIAGNOSIS — N183 Chronic kidney disease, stage 3 (moderate): Secondary | ICD-10-CM | POA: Diagnosis not present

## 2016-04-18 DIAGNOSIS — Z139 Encounter for screening, unspecified: Secondary | ICD-10-CM | POA: Diagnosis not present

## 2016-04-18 DIAGNOSIS — Z0000 Encounter for general adult medical examination without abnormal findings: Secondary | ICD-10-CM | POA: Diagnosis not present

## 2016-04-18 DIAGNOSIS — Z9181 History of falling: Secondary | ICD-10-CM | POA: Diagnosis not present

## 2016-04-18 DIAGNOSIS — Z1389 Encounter for screening for other disorder: Secondary | ICD-10-CM | POA: Diagnosis not present

## 2016-04-19 DIAGNOSIS — Z6833 Body mass index (BMI) 33.0-33.9, adult: Secondary | ICD-10-CM | POA: Diagnosis not present

## 2016-04-19 DIAGNOSIS — I129 Hypertensive chronic kidney disease with stage 1 through stage 4 chronic kidney disease, or unspecified chronic kidney disease: Secondary | ICD-10-CM | POA: Diagnosis not present

## 2016-04-19 DIAGNOSIS — E1149 Type 2 diabetes mellitus with other diabetic neurological complication: Secondary | ICD-10-CM | POA: Diagnosis not present

## 2016-04-19 DIAGNOSIS — E1169 Type 2 diabetes mellitus with other specified complication: Secondary | ICD-10-CM | POA: Diagnosis not present

## 2016-04-19 DIAGNOSIS — E782 Mixed hyperlipidemia: Secondary | ICD-10-CM | POA: Diagnosis not present

## 2016-04-23 DIAGNOSIS — I119 Hypertensive heart disease without heart failure: Secondary | ICD-10-CM | POA: Insufficient documentation

## 2016-04-23 DIAGNOSIS — Z01810 Encounter for preprocedural cardiovascular examination: Secondary | ICD-10-CM | POA: Diagnosis not present

## 2016-04-23 DIAGNOSIS — I10 Essential (primary) hypertension: Secondary | ICD-10-CM | POA: Diagnosis not present

## 2016-04-23 DIAGNOSIS — I480 Paroxysmal atrial fibrillation: Secondary | ICD-10-CM | POA: Insufficient documentation

## 2016-04-23 DIAGNOSIS — E785 Hyperlipidemia, unspecified: Secondary | ICD-10-CM

## 2016-04-23 DIAGNOSIS — E088 Diabetes mellitus due to underlying condition with unspecified complications: Secondary | ICD-10-CM | POA: Diagnosis not present

## 2016-04-23 HISTORY — DX: Hyperlipidemia, unspecified: E78.5

## 2016-04-30 DIAGNOSIS — N189 Chronic kidney disease, unspecified: Secondary | ICD-10-CM | POA: Diagnosis not present

## 2016-04-30 DIAGNOSIS — D509 Iron deficiency anemia, unspecified: Secondary | ICD-10-CM | POA: Diagnosis not present

## 2016-04-30 DIAGNOSIS — D631 Anemia in chronic kidney disease: Secondary | ICD-10-CM | POA: Diagnosis not present

## 2016-04-30 DIAGNOSIS — D518 Other vitamin B12 deficiency anemias: Secondary | ICD-10-CM | POA: Diagnosis not present

## 2016-04-30 DIAGNOSIS — D638 Anemia in other chronic diseases classified elsewhere: Secondary | ICD-10-CM | POA: Diagnosis not present

## 2016-05-01 DIAGNOSIS — R350 Frequency of micturition: Secondary | ICD-10-CM | POA: Diagnosis not present

## 2016-05-01 DIAGNOSIS — N3941 Urge incontinence: Secondary | ICD-10-CM | POA: Diagnosis not present

## 2016-05-02 DIAGNOSIS — I480 Paroxysmal atrial fibrillation: Secondary | ICD-10-CM | POA: Diagnosis not present

## 2016-05-03 DIAGNOSIS — I498 Other specified cardiac arrhythmias: Secondary | ICD-10-CM | POA: Diagnosis not present

## 2016-05-04 DIAGNOSIS — R1032 Left lower quadrant pain: Secondary | ICD-10-CM | POA: Diagnosis not present

## 2016-05-04 DIAGNOSIS — R1012 Left upper quadrant pain: Secondary | ICD-10-CM | POA: Diagnosis not present

## 2016-05-04 DIAGNOSIS — D638 Anemia in other chronic diseases classified elsewhere: Secondary | ICD-10-CM | POA: Diagnosis not present

## 2016-05-04 DIAGNOSIS — D500 Iron deficiency anemia secondary to blood loss (chronic): Secondary | ICD-10-CM | POA: Diagnosis not present

## 2016-05-10 DIAGNOSIS — E1142 Type 2 diabetes mellitus with diabetic polyneuropathy: Secondary | ICD-10-CM | POA: Diagnosis not present

## 2016-05-10 DIAGNOSIS — L97521 Non-pressure chronic ulcer of other part of left foot limited to breakdown of skin: Secondary | ICD-10-CM | POA: Diagnosis not present

## 2016-05-17 DIAGNOSIS — M0589 Other rheumatoid arthritis with rheumatoid factor of multiple sites: Secondary | ICD-10-CM | POA: Diagnosis not present

## 2016-05-29 DIAGNOSIS — N3941 Urge incontinence: Secondary | ICD-10-CM | POA: Diagnosis not present

## 2016-05-29 DIAGNOSIS — R350 Frequency of micturition: Secondary | ICD-10-CM | POA: Diagnosis not present

## 2016-06-15 DIAGNOSIS — M0589 Other rheumatoid arthritis with rheumatoid factor of multiple sites: Secondary | ICD-10-CM | POA: Diagnosis not present

## 2016-06-21 DIAGNOSIS — M170 Bilateral primary osteoarthritis of knee: Secondary | ICD-10-CM | POA: Diagnosis not present

## 2016-06-26 DIAGNOSIS — N3941 Urge incontinence: Secondary | ICD-10-CM | POA: Diagnosis not present

## 2016-06-29 ENCOUNTER — Other Ambulatory Visit: Payer: Self-pay

## 2016-07-05 DIAGNOSIS — L97521 Non-pressure chronic ulcer of other part of left foot limited to breakdown of skin: Secondary | ICD-10-CM | POA: Diagnosis not present

## 2016-07-05 DIAGNOSIS — E1142 Type 2 diabetes mellitus with diabetic polyneuropathy: Secondary | ICD-10-CM | POA: Diagnosis not present

## 2016-07-17 DIAGNOSIS — M0589 Other rheumatoid arthritis with rheumatoid factor of multiple sites: Secondary | ICD-10-CM | POA: Diagnosis not present

## 2016-07-17 DIAGNOSIS — Z79899 Other long term (current) drug therapy: Secondary | ICD-10-CM | POA: Diagnosis not present

## 2016-07-23 DIAGNOSIS — D631 Anemia in chronic kidney disease: Secondary | ICD-10-CM | POA: Diagnosis not present

## 2016-07-23 DIAGNOSIS — R0781 Pleurodynia: Secondary | ICD-10-CM | POA: Diagnosis not present

## 2016-07-23 DIAGNOSIS — D518 Other vitamin B12 deficiency anemias: Secondary | ICD-10-CM | POA: Diagnosis not present

## 2016-07-23 DIAGNOSIS — D509 Iron deficiency anemia, unspecified: Secondary | ICD-10-CM | POA: Diagnosis not present

## 2016-07-23 DIAGNOSIS — N189 Chronic kidney disease, unspecified: Secondary | ICD-10-CM | POA: Diagnosis not present

## 2016-07-24 DIAGNOSIS — M47814 Spondylosis without myelopathy or radiculopathy, thoracic region: Secondary | ICD-10-CM | POA: Diagnosis not present

## 2016-07-24 DIAGNOSIS — D509 Iron deficiency anemia, unspecified: Secondary | ICD-10-CM | POA: Diagnosis not present

## 2016-07-24 DIAGNOSIS — M4184 Other forms of scoliosis, thoracic region: Secondary | ICD-10-CM | POA: Diagnosis not present

## 2016-07-24 DIAGNOSIS — R0781 Pleurodynia: Secondary | ICD-10-CM | POA: Diagnosis not present

## 2016-07-31 DIAGNOSIS — D509 Iron deficiency anemia, unspecified: Secondary | ICD-10-CM | POA: Diagnosis not present

## 2016-08-07 DIAGNOSIS — Z6833 Body mass index (BMI) 33.0-33.9, adult: Secondary | ICD-10-CM | POA: Diagnosis not present

## 2016-08-07 DIAGNOSIS — R600 Localized edema: Secondary | ICD-10-CM | POA: Diagnosis not present

## 2016-08-07 DIAGNOSIS — Z23 Encounter for immunization: Secondary | ICD-10-CM | POA: Diagnosis not present

## 2016-08-07 DIAGNOSIS — M79662 Pain in left lower leg: Secondary | ICD-10-CM | POA: Diagnosis not present

## 2016-08-13 DIAGNOSIS — H35033 Hypertensive retinopathy, bilateral: Secondary | ICD-10-CM | POA: Diagnosis not present

## 2016-08-13 DIAGNOSIS — Z79899 Other long term (current) drug therapy: Secondary | ICD-10-CM | POA: Diagnosis not present

## 2016-08-15 DIAGNOSIS — M0589 Other rheumatoid arthritis with rheumatoid factor of multiple sites: Secondary | ICD-10-CM | POA: Diagnosis not present

## 2016-08-16 ENCOUNTER — Ambulatory Visit (INDEPENDENT_AMBULATORY_CARE_PROVIDER_SITE_OTHER): Payer: Medicare Other | Admitting: Specialist

## 2016-08-16 DIAGNOSIS — M170 Bilateral primary osteoarthritis of knee: Secondary | ICD-10-CM | POA: Diagnosis not present

## 2016-08-20 ENCOUNTER — Encounter (INDEPENDENT_AMBULATORY_CARE_PROVIDER_SITE_OTHER): Payer: Medicare Other | Admitting: Physical Medicine and Rehabilitation

## 2016-08-20 DIAGNOSIS — M5416 Radiculopathy, lumbar region: Secondary | ICD-10-CM

## 2016-08-22 DIAGNOSIS — M150 Primary generalized (osteo)arthritis: Secondary | ICD-10-CM | POA: Diagnosis not present

## 2016-08-22 DIAGNOSIS — M0579 Rheumatoid arthritis with rheumatoid factor of multiple sites without organ or systems involvement: Secondary | ICD-10-CM | POA: Diagnosis not present

## 2016-08-22 DIAGNOSIS — M25551 Pain in right hip: Secondary | ICD-10-CM | POA: Diagnosis not present

## 2016-08-22 DIAGNOSIS — M797 Fibromyalgia: Secondary | ICD-10-CM | POA: Diagnosis not present

## 2016-08-22 DIAGNOSIS — R7612 Nonspecific reaction to cell mediated immunity measurement of gamma interferon antigen response without active tuberculosis: Secondary | ICD-10-CM | POA: Diagnosis not present

## 2016-08-27 ENCOUNTER — Encounter (HOSPITAL_COMMUNITY): Payer: Self-pay | Admitting: Vascular Surgery

## 2016-08-27 ENCOUNTER — Ambulatory Visit (HOSPITAL_COMMUNITY)
Admission: RE | Admit: 2016-08-27 | Discharge: 2016-08-27 | Disposition: A | Discharge status: Home / Self Care | Payer: Medicare Other | Source: Ambulatory Visit | Attending: Surgery | Admitting: Surgery

## 2016-08-27 ENCOUNTER — Encounter (HOSPITAL_COMMUNITY)
Admission: RE | Admit: 2016-08-27 | Discharge: 2016-08-27 | Disposition: A | Discharge status: Home / Self Care | Payer: Medicare Other | Source: Ambulatory Visit | Attending: Specialist | Admitting: Specialist

## 2016-08-27 ENCOUNTER — Encounter (HOSPITAL_COMMUNITY): Payer: Self-pay

## 2016-08-27 DIAGNOSIS — Z01812 Encounter for preprocedural laboratory examination: Secondary | ICD-10-CM | POA: Diagnosis not present

## 2016-08-27 DIAGNOSIS — Z01818 Encounter for other preprocedural examination: Secondary | ICD-10-CM | POA: Diagnosis not present

## 2016-08-27 DIAGNOSIS — J45909 Unspecified asthma, uncomplicated: Secondary | ICD-10-CM | POA: Diagnosis not present

## 2016-08-27 LAB — COMPREHENSIVE METABOLIC PANEL
ALK PHOS: 55 U/L (ref 38–126)
ALT: 14 U/L (ref 14–54)
ANION GAP: 10 (ref 5–15)
AST: 20 U/L (ref 15–41)
Albumin: 3.4 g/dL — ABNORMAL LOW (ref 3.5–5.0)
BUN: 30 mg/dL — ABNORMAL HIGH (ref 6–20)
CALCIUM: 9.2 mg/dL (ref 8.9–10.3)
CO2: 26 mmol/L (ref 22–32)
Chloride: 104 mmol/L (ref 101–111)
Creatinine, Ser: 1.75 mg/dL — ABNORMAL HIGH (ref 0.44–1.00)
GFR, EST AFRICAN AMERICAN: 31 mL/min — AB (ref >60)
GFR, EST NON AFRICAN AMERICAN: 27 mL/min — AB (ref >60)
Glucose, Bld: 110 mg/dL — ABNORMAL HIGH (ref 65–99)
Potassium: 3.8 mmol/L (ref 3.5–5.1)
SODIUM: 140 mmol/L (ref 135–145)
TOTAL PROTEIN: 6.5 g/dL (ref 6.5–8.1)
Total Bilirubin: 0.3 mg/dL (ref 0.3–1.2)

## 2016-08-27 LAB — CBC
HCT: 34.8 % — ABNORMAL LOW (ref 36.0–46.0)
HEMOGLOBIN: 11 g/dL — AB (ref 12.0–15.0)
MCH: 27 pg (ref 26.0–34.0)
MCHC: 31.6 g/dL (ref 30.0–36.0)
MCV: 85.5 fL (ref 78.0–100.0)
PLATELETS: 298 10*3/uL (ref 150–400)
RBC: 4.07 MIL/uL (ref 3.87–5.11)
RDW: 20.4 % — ABNORMAL HIGH (ref 11.5–15.5)
WBC: 15 10*3/uL — ABNORMAL HIGH (ref 4.0–10.5)

## 2016-08-27 LAB — TYPE AND SCREEN
ABO/RH(D): O POS
Antibody Screen: NEGATIVE

## 2016-08-27 LAB — URINALYSIS, ROUTINE W REFLEX MICROSCOPIC
Bilirubin Urine: NEGATIVE
GLUCOSE, UA: NEGATIVE mg/dL
Ketones, ur: NEGATIVE mg/dL
Nitrite: NEGATIVE
PH: 6 (ref 5.0–8.0)
Protein, ur: NEGATIVE mg/dL
SPECIFIC GRAVITY, URINE: 1.008 (ref 1.005–1.030)

## 2016-08-27 LAB — SURGICAL PCR SCREEN
MRSA, PCR: NEGATIVE
STAPHYLOCOCCUS AUREUS: NEGATIVE

## 2016-08-27 LAB — URINE MICROSCOPIC-ADD ON

## 2016-08-27 LAB — PROTIME-INR
INR: 1
PROTHROMBIN TIME: 13.2 s (ref 11.4–15.2)

## 2016-08-27 LAB — GLUCOSE, CAPILLARY: Glucose-Capillary: 121 mg/dL — ABNORMAL HIGH (ref 65–99)

## 2016-08-27 LAB — APTT: aPTT: 26 seconds (ref 24–36)

## 2016-08-27 NOTE — Progress Notes (Deleted)
Prescription called in for Mupirocin positive for MRSA, [facility], left message on patients voicemail with permission from patient verbalizing instructions and where to pick up prescription

## 2016-08-27 NOTE — Pre-Procedure Instructions (Signed)
Gloria Best  08/27/2016      [facility] - [place], [place] [address] [place] [address] Phone: [phone] Fax: [phone]  [id number] - [place], [place] [address] [place] [address] Phone: [phone] Fax: [phone]  [place], [place] [address] Phone: [phone] Fax: [phone]    Your procedure is scheduled on October 24  Report to [place] at [place].M.  Call this number if you have problems the morning of surgery:  [phone]   Remember:  Do not eat food or drink liquids after midnight.   Take these medicines the morning of surgery with A SIP OF WATER acetaminophen (TYLENOL) ARIPiprazole (ABILIFY) , cloNIDine (CATAPRES), COMBIVENT RESPIMAT,  diltiazem (CARDIZEM CD) hydroxychloroquine (PLAQUENIL), eye drops, pregabalin (LYRICA) ranitidine (ZANTAC),  RABEprazole (ACIPHEX), MYRBETRIQ,  escitalopram (LEXAPRO)  7 days prior to surgery STOP taking any Aspirin, Aleve, Naproxen, Ibuprofen, Motrin, Advil, Goody's, BC's, all herbal medications, fish oil, and all vitamins      How to Manage Your Diabetes Before and After Surgery  Why is it important to control my blood sugar before and after surgery? . Improving blood sugar levels before and after surgery helps healing and can limit problems. . A way of improving blood sugar control is eating a healthy diet by: o  Eating less sugar and carbohydrates o  Increasing activity/exercise o  Talking with your doctor about reaching your blood sugar goals . High blood sugars (greater than 180 mg/dL) can raise your risk of infections and slow your recovery, so you will need to focus on controlling your diabetes during the weeks before surgery. . Make sure that the doctor who takes care of your diabetes knows about your planned surgery including  the date and location.  How do I manage my blood sugar before surgery? . Check your blood sugar at least 4 times a day, starting 2 days before surgery, to make sure that the level is not too high or low. o Check your blood sugar the morning of your surgery when you wake up and every 2 hours until you get to the Short Stay unit. . If your blood sugar is less than 70 mg/dL, you will need to treat for low blood sugar: o Do not take insulin. o Treat a low blood sugar (less than 70 mg/dL) with  cup of clear juice (cranberry or apple), 4 glucose tablets, OR glucose gel. o Recheck blood sugar in 15 minutes after treatment (to make sure it is greater than 70 mg/dL). If your blood sugar is not greater than 70 mg/dL on recheck, call [phone] for further instructions. . Report your blood sugar to the short stay nurse when you get to Short Stay.  . If you are admitted to the hospital after surgery: o Your blood sugar will be checked by the staff and you will probably be given insulin after surgery (instead of oral diabetes medicines) to make sure you have good blood sugar levels. o The goal for blood sugar control after surgery is 80-180 mg/dL.       WHAT DO I DO ABOUT MY DIABETES MEDICATION?   Mar[place] Kitchen Do not take oral diabetes medicines (pills) the morning of surgery sitaGLIPtin (JANUVIA) glimepiride (AMARYL)       Do not wear jewelry, make-up or nail polish.  Do not wear  lotions, powders, or perfumes, or deoderant.  Do not shave 48 hours prior to surgery.    Do not bring valuables to the hospital.  [facility] is not responsible for any belongings or valuables.  Contacts, dentures or bridgework may not be worn into surgery.  Leave your suitcase in the car.  After surgery it may be brought to your room.  For patients admitted to the hospital, discharge time will be determined by your treatment team.  Patients discharged the day of surgery will not be allowed to drive home.    Special  instructions:   [facility]- Preparing For Surgery  Before surgery, you can play an important role. Because skin is not sterile, your skin needs to be as free of germs as possible. You can reduce the number of germs on your skin by washing with CHG (chlorahexidine gluconate) Soap before surgery.  CHG is an antiseptic cleaner which kills germs and bonds with the skin to continue killing germs even after washing.  Please do not use if you have an allergy to CHG or antibacterial soaps. If your skin becomes reddened/irritated stop using the CHG.  Do not shave (including legs and underarms) for at least 48 hours prior to first CHG shower. It is OK to shave your face.  Please follow these instructions carefully.   1. Shower the NIGHT BEFORE SURGERY and the MORNING OF SURGERY with CHG.   2. If you chose to wash your hair, wash your hair first as usual with your normal shampoo.  3. After you shampoo, rinse your hair and body thoroughly to remove the shampoo.  4. Use CHG as you would any other liquid soap. You can apply CHG directly to the skin and wash gently with a scrungie or a clean washcloth.   5. Apply the CHG Soap to your body ONLY FROM THE NECK DOWN.  Do not use on open wounds or open sores. Avoid contact with your eyes, ears, mouth and genitals (private parts). Wash genitals (private parts) with your normal soap.  6. Wash thoroughly, paying special attention to the area where your surgery will be performed.  7. Thoroughly rinse your body with warm water from the neck down.  8. DO NOT shower/wash with your normal soap after using and rinsing off the CHG Soap.  9. Pat yourself dry with a CLEAN TOWEL.   10. Wear CLEAN PAJAMAS   11. Place CLEAN SHEETS on your bed the night of your first shower and DO NOT SLEEP WITH PETS.    Day of Surgery: Do not apply any deodorants/lotions. Please wear clean clothes to the hospital/surgery center.      Please read over the following fact sheets  that you were given.

## 2016-08-27 NOTE — Progress Notes (Signed)
PCP - [facility]  Chest x-ray - 08/27/16 EKG - requesting if not received will need DOS Stress Test - requesting ECHO - requesting Cardiac Cath - denies  Patient takes Paradaxa that was stopped on 08/20/16  Called anesthesia for review of chart since surgery is tomorrow.  Faxed over request for records urgently for review.      Patient denies shortness of breath, fever, cough and chest pain at PAT appointment

## 2016-08-27 NOTE — Progress Notes (Signed)
Anesthesia Chart Review: Patient is a 75 year old female scheduled for right TKA on 08/28/16 by Dr. Louanne Skye. PAT was this afternoon.  History includes former smoker, dysrhythmia (PAF), murmur, DM2, GERD, hiatal hernia, OSA, HTN, SOB, incomplete emptying of bladder (required I&O cath '13; saw Dr. Junious Silk), UTI, depression, childhood asthma, fibromyalgia, RA, hysterectomy, appendectomy, right THA,,colon surgery (for "large polyp").   PCP is Dr. Marco Collie.  Cardiologist is Dr. Geraldo Pitter with [facility]. On 04/23/16 he wrote, "In view of multiple risk factors for coronary artery disease and sedentary lifestyle she will have a Lexiscan sestamibi in the next few days. If this is negative then she is not at high risk for coronary events during the aforementioned surgery. Meticulous hemodynamic monitoring will further reduce the risk of coronary events. Her medications especially calcium channel blocker should be continued in the perioperative period. Atrial fibrillation and anticoagulation issues were discussed. I mentioned to her that she will need to stop her anticoagulation for 5 days before the surgery. Lovenox patient was discussed but she is not keen on it. Benefits this were explained. She will be seen in follow up appointment on a when necessary basis only."  Meds include Abilify, Lipitor, clonidine, Combivent, Pradaxa, Cardizem CD, Lexapro, folic acid, Lasix, Amaryl, plaquenil, KCl, Mirapex, Lyrica, AcipHex, Zantac, Myrbetriq ER, Januvia. She reports Pradaxa held starting 08/20/16.   BP (!) 110/51   Pulse (!) 58   Temp 36.7 C (Oral)   Resp 20   Ht 5' (1.524 m)   Wt 166 lb 2 oz (75.4 kg)   SpO2 100%   BMI 32.44 kg/m   04/23/16 EKG [facility]): SR.  05/03/16 24 hour Holter Monitor (Care Everywhere): CONCLUSIONS: 1. Unremarable Holter monitor.  2.Arrhythmia as described. 3.Symptoms were not reported.  4.Symptoms were not correlated with  arrhythmias.  11/24/11 Echo [facility]; scanned under Media tab; Correspondence 06/09/12): Normal LV function, EF 16%, normal diastolic filling pattern, mild MI, mild TI, upper limits of normal pulmonary pressure (35 mmHg).  Since I didn't see a stress test under Care Everywhere, I called Dr. Ervin Knack office. PAT RN also called the patient. She thought that maybe she had only had the Holter monitor. Dr. Shana Chute call center said that stress test was marked as complete, but they could not access the report. They also said that their computer system was down. I asked them to fax the report once their system is up. I also asked that Dr. Julien Nordmann nurse call me this afternoon.  Preoperative labs noted. BUN 30, Cr 1.75 (overall stable when compared to labs from 11/12/13 but I don't have recent comparison labs--requested from PCP). Glucose 110. WBC 15.0, H/H 11.0/34.8, PLT 298K. PT/PTT WNL. A1c pending. UA/microscopic showed large leukocytes, negative nitrites, few bacteria, WBC too numerous to count, 6-30 epithelials, hyaline casts.   I called UA/microscopic, elevated WBC result to [place] at Dr. Otho Ket office. I also advised that we were still awaiting patient's stress test report and a phone call from Dr. Ervin Knack office to verify further if it had been done. Dr. Louanne Skye is going to cancel surgery for tomorrow due to leukocytosis and abnormal UA. He is advising that she follow-up with Dr. Nyra Capes (labs faxed to Dr. Marco Collie).  I will plan to follow-up with Dr. Ervin Knack office.   George Hugh [facility] Short Stay Center/Anesthesiology Phone [phone] 08/27/2016 4:49 PM

## 2016-08-28 ENCOUNTER — Encounter (HOSPITAL_COMMUNITY): Admission: RE | Payer: Self-pay | Source: Ambulatory Visit

## 2016-08-28 ENCOUNTER — Inpatient Hospital Stay (HOSPITAL_COMMUNITY): Admission: RE | Admit: 2016-08-28 | Payer: Medicare Other | Source: Ambulatory Visit | Admitting: Specialist

## 2016-08-28 DIAGNOSIS — Z6832 Body mass index (BMI) 32.0-32.9, adult: Secondary | ICD-10-CM | POA: Diagnosis not present

## 2016-08-28 DIAGNOSIS — N390 Urinary tract infection, site not specified: Secondary | ICD-10-CM | POA: Diagnosis not present

## 2016-08-28 LAB — HEMOGLOBIN A1C
HEMOGLOBIN A1C: 5.6 % (ref 4.8–5.6)
Mean Plasma Glucose: 114 mg/dL

## 2016-08-28 SURGERY — ARTHROPLASTY, KNEE, TOTAL
Anesthesia: General | Laterality: Right

## 2016-09-03 DIAGNOSIS — N390 Urinary tract infection, site not specified: Secondary | ICD-10-CM | POA: Diagnosis not present

## 2016-09-03 DIAGNOSIS — Z6832 Body mass index (BMI) 32.0-32.9, adult: Secondary | ICD-10-CM | POA: Diagnosis not present

## 2016-09-04 DIAGNOSIS — D631 Anemia in chronic kidney disease: Secondary | ICD-10-CM | POA: Diagnosis not present

## 2016-09-04 DIAGNOSIS — N189 Chronic kidney disease, unspecified: Secondary | ICD-10-CM | POA: Diagnosis not present

## 2016-09-04 DIAGNOSIS — D509 Iron deficiency anemia, unspecified: Secondary | ICD-10-CM | POA: Diagnosis not present

## 2016-09-04 DIAGNOSIS — E538 Deficiency of other specified B group vitamins: Secondary | ICD-10-CM | POA: Diagnosis not present

## 2016-09-06 DIAGNOSIS — L97521 Non-pressure chronic ulcer of other part of left foot limited to breakdown of skin: Secondary | ICD-10-CM | POA: Diagnosis not present

## 2016-09-06 DIAGNOSIS — E1142 Type 2 diabetes mellitus with diabetic polyneuropathy: Secondary | ICD-10-CM | POA: Diagnosis not present

## 2016-09-10 DIAGNOSIS — Z6832 Body mass index (BMI) 32.0-32.9, adult: Secondary | ICD-10-CM | POA: Diagnosis not present

## 2016-09-10 DIAGNOSIS — R05 Cough: Secondary | ICD-10-CM | POA: Diagnosis not present

## 2016-09-13 ENCOUNTER — Inpatient Hospital Stay (INDEPENDENT_AMBULATORY_CARE_PROVIDER_SITE_OTHER): Payer: Medicare Other | Admitting: Specialist

## 2016-09-24 DIAGNOSIS — M0589 Other rheumatoid arthritis with rheumatoid factor of multiple sites: Secondary | ICD-10-CM | POA: Diagnosis not present

## 2016-09-25 DIAGNOSIS — I129 Hypertensive chronic kidney disease with stage 1 through stage 4 chronic kidney disease, or unspecified chronic kidney disease: Secondary | ICD-10-CM | POA: Diagnosis not present

## 2016-09-25 DIAGNOSIS — E1169 Type 2 diabetes mellitus with other specified complication: Secondary | ICD-10-CM | POA: Diagnosis not present

## 2016-09-25 DIAGNOSIS — E1149 Type 2 diabetes mellitus with other diabetic neurological complication: Secondary | ICD-10-CM | POA: Diagnosis not present

## 2016-09-25 DIAGNOSIS — D649 Anemia, unspecified: Secondary | ICD-10-CM | POA: Diagnosis not present

## 2016-10-02 DIAGNOSIS — E782 Mixed hyperlipidemia: Secondary | ICD-10-CM | POA: Diagnosis not present

## 2016-10-02 DIAGNOSIS — I129 Hypertensive chronic kidney disease with stage 1 through stage 4 chronic kidney disease, or unspecified chronic kidney disease: Secondary | ICD-10-CM | POA: Diagnosis not present

## 2016-10-02 DIAGNOSIS — E1169 Type 2 diabetes mellitus with other specified complication: Secondary | ICD-10-CM | POA: Diagnosis not present

## 2016-10-02 DIAGNOSIS — E1149 Type 2 diabetes mellitus with other diabetic neurological complication: Secondary | ICD-10-CM | POA: Diagnosis not present

## 2016-10-11 DIAGNOSIS — I480 Paroxysmal atrial fibrillation: Secondary | ICD-10-CM | POA: Diagnosis not present

## 2016-10-11 DIAGNOSIS — I10 Essential (primary) hypertension: Secondary | ICD-10-CM | POA: Diagnosis not present

## 2016-10-11 DIAGNOSIS — E785 Hyperlipidemia, unspecified: Secondary | ICD-10-CM | POA: Diagnosis not present

## 2016-10-11 DIAGNOSIS — E088 Diabetes mellitus due to underlying condition with unspecified complications: Secondary | ICD-10-CM | POA: Diagnosis not present

## 2016-10-11 DIAGNOSIS — Z01810 Encounter for preprocedural cardiovascular examination: Secondary | ICD-10-CM | POA: Diagnosis not present

## 2016-10-16 DIAGNOSIS — I480 Paroxysmal atrial fibrillation: Secondary | ICD-10-CM | POA: Diagnosis not present

## 2016-10-16 DIAGNOSIS — I10 Essential (primary) hypertension: Secondary | ICD-10-CM | POA: Diagnosis not present

## 2016-10-16 DIAGNOSIS — E785 Hyperlipidemia, unspecified: Secondary | ICD-10-CM | POA: Diagnosis not present

## 2016-10-16 DIAGNOSIS — Z01810 Encounter for preprocedural cardiovascular examination: Secondary | ICD-10-CM | POA: Diagnosis not present

## 2016-10-16 DIAGNOSIS — E088 Diabetes mellitus due to underlying condition with unspecified complications: Secondary | ICD-10-CM | POA: Diagnosis not present

## 2016-10-22 DIAGNOSIS — M0589 Other rheumatoid arthritis with rheumatoid factor of multiple sites: Secondary | ICD-10-CM | POA: Diagnosis not present

## 2016-11-15 DIAGNOSIS — E1142 Type 2 diabetes mellitus with diabetic polyneuropathy: Secondary | ICD-10-CM | POA: Diagnosis not present

## 2016-11-15 DIAGNOSIS — L97521 Non-pressure chronic ulcer of other part of left foot limited to breakdown of skin: Secondary | ICD-10-CM | POA: Diagnosis not present

## 2016-11-15 DIAGNOSIS — M21622 Bunionette of left foot: Secondary | ICD-10-CM

## 2016-11-15 HISTORY — DX: Bunionette of left foot: M21.622

## 2016-11-19 ENCOUNTER — Encounter (HOSPITAL_COMMUNITY): Payer: Self-pay | Admitting: Vascular Surgery

## 2016-11-19 NOTE — Progress Notes (Addendum)
Anesthesia Chart Review: Patient is a 76 year old female scheduled for right TKA on 11/26/16 by Dr. Louanne Skye. Procedure was initially scheduled for 08/28/16, but was delayed due to leukocytosis and abnormal UA. She also needed to complete the stress test that had been ordered by cardiology. PAT is scheduled for 11/22/16.   History includes former smoker, dysrhythmia (PAF), murmur, DM2, GERD, hiatal hernia, OSA, HTN, SOB, incomplete emptying of bladder (required I&O cath '13; saw Dr. Junious Silk), UTI, depression, childhood asthma, fibromyalgia, RA, hysterectomy, appendectomy, right THA, colon surgery (for "large polyp").   PCP is listed as Dr. Marco Collie with [facility] [place]. Records requested.  Cardiologist is Dr. Geraldo Pitter with [facility] [place], last visit 10/11/16. He wrote, "In view of her sedentary lifestyle and multiple risk factors she'll have a Lexiscan sestamibi. If this is negative then she is not at high risk for coronary events during the aforementioned surgery. Meticulous hemodynamic monitoring will further reduce the risk of coronary events. She quit. Her anticoagulation for 5 days before the surgery and resume per the directions of exertion. I gave her options of Lovenox bridging but she is not keen on it and I respect her wishes."  Meds include Abilify, Lipitor, clonidine, Combivent, Pradaxa, Cardizem CD, Lexapro, folic acid, Lasix, Amaryl, plaquenil, KCl, Mirapex, Lyrica, AcipHex, Zantac, Myrbetriq ER, Januvia.   10/16/16 Nuclear stress test (UNCRP-Cardiology): Impression: No ischemia seen on the scan. Normal gated images. Normal EF at 66%.   04/23/16 EKG [facility]): SR.  05/03/16 24 hour Holter Monitor (Care Everywhere): CONCLUSIONS: 1. Unremarable Holter monitor.  2.Arrhythmia as described. 3.Symptoms were not reported.  4.Symptoms were not correlated with arrhythmias.  11/24/11 Echo [facility]; scanned  under Media tab; Correspondence 06/09/12): Normal LV function, EF 94%, normal diastolic filling pattern, mild MI, mild TI, upper limits of normal pulmonary pressure (35 mmHg).  Preoperative labs PENDING PAT visit. Her BUN/Cr were 30/1.75 on 08/27/16. Comparison labs requested from Dr. Nyra Capes, but are still pending.   Additional input pending her PAT visit. Will need to verify date that Pradaxa was held.  George Hugh [facility] Short Stay Center/Anesthesiology Phone [phone] 11/19/2016 4:20 PM

## 2016-11-22 ENCOUNTER — Inpatient Hospital Stay (HOSPITAL_COMMUNITY)
Admission: RE | Admit: 2016-11-22 | Discharge: 2016-11-22 | Disposition: A | Discharge status: Home / Self Care | Payer: Medicare Other | Source: Ambulatory Visit

## 2016-11-22 NOTE — Pre-Procedure Instructions (Signed)
Gloria Best  11/22/2016      [facility] - [place], [place] [address] [place] [address] Phone: [phone] Fax: [phone]  [id number] - [place], [place] [address] [place] [address] Phone: [phone] Fax: [phone]  [place], [place] [address] Phone: [phone] Fax: [phone]    Your procedure is scheduled on Mon, Jan 22 @ 7:30 AM  Report to [facility] Admitting at 5:30 AM  Call this number if you have problems the morning of surgery:  [phone]   Remember:  Do not eat food or drink liquids after midnight.  Take these medicines the morning of surgery with A SIP OF WATER Clonidine(Catapres),Combivent<Bring Your Inhaler With You>,Lexapro(Escitalopram),Eye Drops,Lyrica(Pregabalin),Myrbetriq, and Aciphex(Rabeprazole)             Hold your Pradaxa 5 days prior to surgery. No Goody's,BC's,Aleve,Advil,Motrin,Ibuprofen,Fish Oil,or any Herbal Medications.      How to Manage Your Diabetes Before and After Surgery  Why is it important to control my blood sugar before and after surgery? . Improving blood sugar levels before and after surgery helps healing and can limit problems. . A way of improving blood sugar control is eating a healthy diet by: o  Eating less sugar and carbohydrates o  Increasing activity/exercise o  Talking with your doctor about reaching your blood sugar goals . High blood sugars (greater than 180 mg/dL) can raise your risk of infections and slow your recovery, so you will need to focus on controlling your diabetes during the weeks before surgery. . Make sure that the doctor who takes care of your diabetes knows about your planned surgery including the date and location.  How do I manage my blood sugar before surgery? . Check your blood sugar at least 4 times a day, starting  2 days before surgery, to make sure that the level is not too high or low. o Check your blood sugar the morning of your surgery when you wake up and every 2 hours until you get to the Short Stay unit. . If your blood sugar is less than 70 mg/dL, you will need to treat for low blood sugar: o Do not take insulin. o Treat a low blood sugar (less than 70 mg/dL) with  cup of clear juice (cranberry or apple), 4 glucose tablets, OR glucose gel. o Recheck blood sugar in 15 minutes after treatment (to make sure it is greater than 70 mg/dL). If your blood sugar is not greater than 70 mg/dL on recheck, call [phone] for further instructions. . Report your blood sugar to the short stay nurse when you get to Short Stay.  . If you are admitted to the hospital after surgery: o Your blood sugar will be checked by the staff and you will probably be given insulin after surgery (instead of oral diabetes medicines) to make sure you have good blood sugar levels. o The goal for blood sugar control after surgery is 80-180 mg/dL.              WHAT DO I DO ABOUT MY DIABETES MEDICATION?   Marland Kitchen Do not take oral diabetes medicines (pills) the morning of surgery.      . The day of surgery, do not take other diabetes injectables, including Byetta (exenatide), Bydureon (exenatide ER), Victoza (liraglutide), or Trulicity (dulaglutide).  . If your  CBG is greater than 220 mg/dL, you may take  of your sliding scale (correction) dose of insulin.  Other Instructions:          Reviewed and Endorsed by [facility] Patient Education Committee, August 2015   Do not wear jewelry, make-up or nail polish.  Do not wear lotions, powders, or perfumes, or deoderant.  Do not shave 48 hours prior to surgery.  Men may shave face and neck.  Do not bring valuables to the hospital.  [facility] is not responsible for any belongings or valuables.  Contacts, dentures or bridgework may not be worn into surgery.  Leave  your suitcase in the car.  After surgery it may be brought to your room.  For patients admitted to the hospital, discharge time will be determined by your treatment team.  Patients discharged the day of surgery will not be allowed to drive home.    [facility]  Before surgery, you can play an important role.  Because skin is not sterile, your skin needs to be as free of germs as possible.  You can reduce the number of germs on you skin by washing with CHG (chlorahexidine gluconate) soap before surgery.  CHG is an antiseptic cleaner which kills germs and bonds with the skin to continue killing germs even after washing.  Please DO NOT use if you have an allergy to CHG or antibacterial soaps.  If your skin becomes reddened/irritated stop using the CHG and inform your nurse when you arrive at Short Stay.  Do not shave (including legs and underarms) for at least 48 hours prior to the first CHG shower.  You may shave your face.  Please follow these instructions carefully:   1.  Shower with CHG Soap the night before surgery and the                                morning of Surgery.  2.  If you choose to wash your hair, wash your hair first as usual with your       normal shampoo.  3.  After you shampoo, rinse your hair and body thoroughly to remove the                      Shampoo.  4.  Use CHG as you would any other liquid soap.  You can apply chg directly       to the skin and wash gently with scrungie or a clean washcloth.  5.  Apply the CHG Soap to your body ONLY FROM THE NECK DOWN.        Do not use on open wounds or open sores.  Avoid contact with your eyes,       ears, mouth and genitals (private parts).  Wash genitals (private parts)       with your normal soap.  6.  Wash thoroughly, paying special attention to the area where your surgery        will be performed.  7.  Thoroughly rinse your body with warm water from the neck down.  8.  DO NOT  shower/wash with your normal soap after using and rinsing off       the CHG Soap.  9.  Pat yourself dry with a clean towel.            10.  Wear clean pajamas.  11.  Place clean sheets on your bed the night of your first shower and do not        sleep with pets.  Day of Surgery  Do not apply any lotions/deoderants the morning of surgery.  Please wear clean clothes to the hospital/surgery center.    Please read over the following fact sheets that you were given. Pain Booklet, Coughing and Deep Breathing, MRSA Information and Surgical Site Infection Prevention

## 2016-11-23 ENCOUNTER — Encounter (HOSPITAL_COMMUNITY): Payer: Self-pay

## 2016-11-23 ENCOUNTER — Encounter (HOSPITAL_COMMUNITY)
Admission: RE | Admit: 2016-11-23 | Discharge: 2016-11-23 | Disposition: A | Discharge status: Home / Self Care | Payer: Medicare Other | Source: Ambulatory Visit | Attending: Specialist | Admitting: Specialist

## 2016-11-23 DIAGNOSIS — Z79899 Other long term (current) drug therapy: Secondary | ICD-10-CM | POA: Diagnosis not present

## 2016-11-23 DIAGNOSIS — Z87891 Personal history of nicotine dependence: Secondary | ICD-10-CM | POA: Diagnosis not present

## 2016-11-23 DIAGNOSIS — Z01818 Encounter for other preprocedural examination: Secondary | ICD-10-CM | POA: Insufficient documentation

## 2016-11-23 DIAGNOSIS — M069 Rheumatoid arthritis, unspecified: Secondary | ICD-10-CM | POA: Insufficient documentation

## 2016-11-23 DIAGNOSIS — N183 Chronic kidney disease, stage 3 (moderate): Secondary | ICD-10-CM | POA: Insufficient documentation

## 2016-11-23 DIAGNOSIS — E1122 Type 2 diabetes mellitus with diabetic chronic kidney disease: Secondary | ICD-10-CM | POA: Diagnosis not present

## 2016-11-23 DIAGNOSIS — K219 Gastro-esophageal reflux disease without esophagitis: Secondary | ICD-10-CM | POA: Insufficient documentation

## 2016-11-23 DIAGNOSIS — F329 Major depressive disorder, single episode, unspecified: Secondary | ICD-10-CM | POA: Diagnosis not present

## 2016-11-23 DIAGNOSIS — I480 Paroxysmal atrial fibrillation: Secondary | ICD-10-CM | POA: Insufficient documentation

## 2016-11-23 DIAGNOSIS — G4733 Obstructive sleep apnea (adult) (pediatric): Secondary | ICD-10-CM | POA: Insufficient documentation

## 2016-11-23 DIAGNOSIS — R011 Cardiac murmur, unspecified: Secondary | ICD-10-CM | POA: Insufficient documentation

## 2016-11-23 DIAGNOSIS — M13861 Other specified arthritis, right knee: Secondary | ICD-10-CM | POA: Diagnosis not present

## 2016-11-23 DIAGNOSIS — I129 Hypertensive chronic kidney disease with stage 1 through stage 4 chronic kidney disease, or unspecified chronic kidney disease: Secondary | ICD-10-CM | POA: Insufficient documentation

## 2016-11-23 HISTORY — DX: Anxiety disorder, unspecified: F41.9

## 2016-11-23 HISTORY — DX: Chronic kidney disease, unspecified: N18.9

## 2016-11-23 LAB — URINALYSIS, ROUTINE W REFLEX MICROSCOPIC
BILIRUBIN URINE: NEGATIVE
Glucose, UA: NEGATIVE mg/dL
KETONES UR: NEGATIVE mg/dL
NITRITE: NEGATIVE
PROTEIN: 30 mg/dL — AB
SPECIFIC GRAVITY, URINE: 1.005 (ref 1.005–1.030)
pH: 6 (ref 5.0–8.0)

## 2016-11-23 LAB — CBC
HCT: 33.5 % — ABNORMAL LOW (ref 36.0–46.0)
HEMOGLOBIN: 10.9 g/dL — AB (ref 12.0–15.0)
MCH: 27.7 pg (ref 26.0–34.0)
MCHC: 32.5 g/dL (ref 30.0–36.0)
MCV: 85 fL (ref 78.0–100.0)
PLATELETS: 231 10*3/uL (ref 150–400)
RBC: 3.94 MIL/uL (ref 3.87–5.11)
RDW: 14.5 % (ref 11.5–15.5)
WBC: 7.4 10*3/uL (ref 4.0–10.5)

## 2016-11-23 LAB — COMPREHENSIVE METABOLIC PANEL
ALBUMIN: 3.4 g/dL — AB (ref 3.5–5.0)
ALK PHOS: 40 U/L (ref 38–126)
ALT: 19 U/L (ref 14–54)
AST: 38 U/L (ref 15–41)
Anion gap: 12 (ref 5–15)
BUN: 29 mg/dL — ABNORMAL HIGH (ref 6–20)
CO2: 24 mmol/L (ref 22–32)
Calcium: 8.8 mg/dL — ABNORMAL LOW (ref 8.9–10.3)
Chloride: 99 mmol/L — ABNORMAL LOW (ref 101–111)
Creatinine, Ser: 1.94 mg/dL — ABNORMAL HIGH (ref 0.44–1.00)
GFR calc non Af Amer: 24 mL/min — ABNORMAL LOW (ref >60)
GFR, EST AFRICAN AMERICAN: 28 mL/min — AB (ref >60)
Glucose, Bld: 110 mg/dL — ABNORMAL HIGH (ref 65–99)
Potassium: 3.5 mmol/L (ref 3.5–5.1)
SODIUM: 135 mmol/L (ref 135–145)
TOTAL PROTEIN: 6.9 g/dL (ref 6.5–8.1)
Total Bilirubin: 0.3 mg/dL (ref 0.3–1.2)

## 2016-11-23 LAB — TYPE AND SCREEN
ABO/RH(D): O POS
ANTIBODY SCREEN: NEGATIVE

## 2016-11-23 LAB — PROTIME-INR
INR: 0.97
PROTHROMBIN TIME: 12.9 s (ref 11.4–15.2)

## 2016-11-23 LAB — SURGICAL PCR SCREEN
MRSA, PCR: NEGATIVE
STAPHYLOCOCCUS AUREUS: NEGATIVE

## 2016-11-23 LAB — GLUCOSE, CAPILLARY: Glucose-Capillary: 124 mg/dL — ABNORMAL HIGH (ref 65–99)

## 2016-11-23 LAB — APTT: APTT: 28 s (ref 24–36)

## 2016-11-23 NOTE — Progress Notes (Signed)
PCP is Beth Hodges,she follows pt. For chronic kidney disease. Pt. No longer does In&out  Caths.  Last office visit and labs done in November in Chart. Stress Test results in chart.

## 2016-11-23 NOTE — Progress Notes (Signed)
Anesthesia follow-up: See my note from 11/19/16. PAT was moved from 11/22/16 to 11/23/16 due to inclement weather. Surgery is Monday.   Confirmed PCP is Dr. Marco Collie. Patient has known CKD stage III. She does not see a nephrologist. She no longer I&O caths. She reports last dose Pradaxa dose 11/16/16.    Labs today show UA hazy, large leukocytes, negative nitrites, UA WBC too numerous to count. Leukocytosis resolved (WBC 7.4) since 08/27/16. H/H 10.9/33.5. PT/PTT WNL. Glucose 110. BUN 29, Cr 1.94, eGFR 24. Comparison labs fro 08/27/16 show BUN 30, Cr 1.75, eGRF 27 and from 09/25/16 (Dr. Nyra Capes) BUN 23, Cr 1.63, eGRF 30. BUN, Cr up a little from her last labs (baseline Cr appears to be ~ 1.7 since 2015). UA and BUN/Cr results called to [place] at Dr. Otho Ket office. She will review with him for recommendations. For now, I am planning to order a STAT BMET on the day of surgery. If surgery proceeds as planned, she will need close monitoring of her renal function.   George Hugh [facility] Short Stay Center/Anesthesiology Phone [phone] 11/23/2016 4:04 PM

## 2016-11-25 NOTE — Progress Notes (Signed)
Patient underwent preop evaluation on Friday 11/23/2016, results of UA show Too numerous to count WBC per HPF, leukocyte positive, nitrite negative. She has no symptoms but the study is highly suggestive of UTI. I called her and talked to her about the  Study, her primary care MD is Dr. Marco Collie.  I have cancelled her surgery pending treatment of a Urinary Tract Infection. OR called and informed.

## 2016-11-26 ENCOUNTER — Encounter (HOSPITAL_COMMUNITY): Admission: RE | Payer: Self-pay | Source: Ambulatory Visit

## 2016-11-26 ENCOUNTER — Inpatient Hospital Stay (HOSPITAL_COMMUNITY): Admission: RE | Admit: 2016-11-26 | Payer: Medicare Other | Source: Ambulatory Visit | Admitting: Specialist

## 2016-11-26 SURGERY — ARTHROPLASTY, KNEE, TOTAL
Anesthesia: General | Laterality: Right

## 2016-12-06 DIAGNOSIS — N390 Urinary tract infection, site not specified: Secondary | ICD-10-CM | POA: Diagnosis not present

## 2016-12-06 DIAGNOSIS — Z6831 Body mass index (BMI) 31.0-31.9, adult: Secondary | ICD-10-CM | POA: Diagnosis not present

## 2016-12-07 ENCOUNTER — Encounter (HOSPITAL_COMMUNITY): Payer: Self-pay | Admitting: *Deleted

## 2016-12-09 NOTE — Anesthesia Preprocedure Evaluation (Addendum)
Anesthesia Evaluation  Patient identified by MRN, date of birth, ID band Patient awake    Reviewed: Allergy & Precautions, H&P , NPO status , Patient's Chart, lab work & pertinent test results  Airway Mallampati: III  TM Distance: >3 FB Neck ROM: Full    Dental no notable dental hx. (+) Edentulous Upper, Dental Advisory Given   Pulmonary asthma , sleep apnea , former smoker,    Pulmonary exam normal breath sounds clear to auscultation       Cardiovascular Exercise Tolerance: Good hypertension, Pt. on medications + dysrhythmias Atrial Fibrillation  Rhythm:Regular Rate:Normal     Neuro/Psych Anxiety Depression negative neurological ROS     GI/Hepatic Neg liver ROS, GERD  Medicated and Poorly Controlled,  Endo/Other  diabetes, Type 2, Oral Hypoglycemic Agents  Renal/GU Renal InsufficiencyRenal disease  negative genitourinary   Musculoskeletal  (+) Arthritis , Osteoarthritis,  Fibromyalgia -  Abdominal   Peds  Hematology negative hematology ROS (+) anemia ,   Anesthesia Other Findings   Reproductive/Obstetrics negative OB ROS                            Anesthesia Physical Anesthesia Plan  ASA: III  Anesthesia Plan: General   Post-op Pain Management:  Regional for Post-op pain   Induction: Intravenous  Airway Management Planned: Oral ETT  Additional Equipment:   Intra-op Plan:   Post-operative Plan: Extubation in OR  Informed Consent: I have reviewed the patients History and Physical, chart, labs and discussed the procedure including the risks, benefits and alternatives for the proposed anesthesia with the patient or authorized representative who has indicated his/her understanding and acceptance.   Dental advisory given  Plan Discussed with: CRNA  Anesthesia Plan Comments:        Anesthesia Quick Evaluation

## 2016-12-10 ENCOUNTER — Encounter (HOSPITAL_COMMUNITY)
Admission: RE | Disposition: A | Discharge status: Skilled Nursing Facility | Payer: Self-pay | Source: Ambulatory Visit | Attending: Specialist

## 2016-12-10 ENCOUNTER — Inpatient Hospital Stay (HOSPITAL_COMMUNITY)
Admission: RE | Admit: 2016-12-10 | Discharge: 2016-12-14 | DRG: 470 | Disposition: A | Discharge status: Skilled Nursing Facility | Payer: Medicare Other | Source: Ambulatory Visit | Attending: Specialist | Admitting: Specialist

## 2016-12-10 ENCOUNTER — Inpatient Hospital Stay (HOSPITAL_COMMUNITY): Payer: Medicare Other | Admitting: Anesthesiology

## 2016-12-10 ENCOUNTER — Encounter (HOSPITAL_COMMUNITY): Payer: Self-pay | Admitting: *Deleted

## 2016-12-10 ENCOUNTER — Inpatient Hospital Stay (HOSPITAL_COMMUNITY): Payer: Medicare Other

## 2016-12-10 ENCOUNTER — Inpatient Hospital Stay (INDEPENDENT_AMBULATORY_CARE_PROVIDER_SITE_OTHER): Payer: Medicare Other | Admitting: Specialist

## 2016-12-10 DIAGNOSIS — R330 Drug induced retention of urine: Secondary | ICD-10-CM | POA: Diagnosis not present

## 2016-12-10 DIAGNOSIS — Z79899 Other long term (current) drug therapy: Secondary | ICD-10-CM

## 2016-12-10 DIAGNOSIS — E1122 Type 2 diabetes mellitus with diabetic chronic kidney disease: Secondary | ICD-10-CM | POA: Diagnosis present

## 2016-12-10 DIAGNOSIS — N329 Bladder disorder, unspecified: Secondary | ICD-10-CM | POA: Diagnosis not present

## 2016-12-10 DIAGNOSIS — Z91040 Latex allergy status: Secondary | ICD-10-CM

## 2016-12-10 DIAGNOSIS — F329 Major depressive disorder, single episode, unspecified: Secondary | ICD-10-CM | POA: Diagnosis present

## 2016-12-10 DIAGNOSIS — I129 Hypertensive chronic kidney disease with stage 1 through stage 4 chronic kidney disease, or unspecified chronic kidney disease: Secondary | ICD-10-CM | POA: Diagnosis present

## 2016-12-10 DIAGNOSIS — R531 Weakness: Secondary | ICD-10-CM | POA: Diagnosis not present

## 2016-12-10 DIAGNOSIS — R338 Other retention of urine: Secondary | ICD-10-CM | POA: Diagnosis not present

## 2016-12-10 DIAGNOSIS — Z888 Allergy status to other drugs, medicaments and biological substances status: Secondary | ICD-10-CM

## 2016-12-10 DIAGNOSIS — Z452 Encounter for adjustment and management of vascular access device: Secondary | ICD-10-CM | POA: Diagnosis not present

## 2016-12-10 DIAGNOSIS — M1611 Unilateral primary osteoarthritis, right hip: Secondary | ICD-10-CM | POA: Diagnosis present

## 2016-12-10 DIAGNOSIS — D62 Acute posthemorrhagic anemia: Secondary | ICD-10-CM | POA: Diagnosis not present

## 2016-12-10 DIAGNOSIS — M1711 Unilateral primary osteoarthritis, right knee: Secondary | ICD-10-CM | POA: Diagnosis not present

## 2016-12-10 DIAGNOSIS — Z9189 Other specified personal risk factors, not elsewhere classified: Secondary | ICD-10-CM | POA: Diagnosis not present

## 2016-12-10 DIAGNOSIS — E1142 Type 2 diabetes mellitus with diabetic polyneuropathy: Secondary | ICD-10-CM | POA: Diagnosis present

## 2016-12-10 DIAGNOSIS — D649 Anemia, unspecified: Secondary | ICD-10-CM | POA: Diagnosis not present

## 2016-12-10 DIAGNOSIS — T40605A Adverse effect of unspecified narcotics, initial encounter: Secondary | ICD-10-CM | POA: Diagnosis not present

## 2016-12-10 DIAGNOSIS — Z794 Long term (current) use of insulin: Secondary | ICD-10-CM

## 2016-12-10 DIAGNOSIS — G8929 Other chronic pain: Secondary | ICD-10-CM | POA: Diagnosis present

## 2016-12-10 DIAGNOSIS — N189 Chronic kidney disease, unspecified: Secondary | ICD-10-CM | POA: Diagnosis present

## 2016-12-10 DIAGNOSIS — F419 Anxiety disorder, unspecified: Secondary | ICD-10-CM | POA: Diagnosis present

## 2016-12-10 DIAGNOSIS — K219 Gastro-esophageal reflux disease without esophagitis: Secondary | ICD-10-CM | POA: Diagnosis present

## 2016-12-10 DIAGNOSIS — M6281 Muscle weakness (generalized): Secondary | ICD-10-CM | POA: Diagnosis not present

## 2016-12-10 DIAGNOSIS — N9989 Other postprocedural complications and disorders of genitourinary system: Secondary | ICD-10-CM | POA: Diagnosis not present

## 2016-12-10 DIAGNOSIS — I10 Essential (primary) hypertension: Secondary | ICD-10-CM | POA: Diagnosis not present

## 2016-12-10 DIAGNOSIS — Z87891 Personal history of nicotine dependence: Secondary | ICD-10-CM

## 2016-12-10 DIAGNOSIS — Z7901 Long term (current) use of anticoagulants: Secondary | ICD-10-CM | POA: Diagnosis not present

## 2016-12-10 DIAGNOSIS — M549 Dorsalgia, unspecified: Secondary | ICD-10-CM | POA: Diagnosis present

## 2016-12-10 DIAGNOSIS — Z471 Aftercare following joint replacement surgery: Secondary | ICD-10-CM | POA: Diagnosis not present

## 2016-12-10 DIAGNOSIS — M25569 Pain in unspecified knee: Secondary | ICD-10-CM | POA: Diagnosis not present

## 2016-12-10 DIAGNOSIS — Z79891 Long term (current) use of opiate analgesic: Secondary | ICD-10-CM

## 2016-12-10 DIAGNOSIS — I4891 Unspecified atrial fibrillation: Secondary | ICD-10-CM | POA: Diagnosis present

## 2016-12-10 DIAGNOSIS — Y92239 Unspecified place in hospital as the place of occurrence of the external cause: Secondary | ICD-10-CM | POA: Diagnosis not present

## 2016-12-10 DIAGNOSIS — M797 Fibromyalgia: Secondary | ICD-10-CM | POA: Diagnosis present

## 2016-12-10 DIAGNOSIS — J45909 Unspecified asthma, uncomplicated: Secondary | ICD-10-CM | POA: Diagnosis present

## 2016-12-10 DIAGNOSIS — Z96651 Presence of right artificial knee joint: Secondary | ICD-10-CM | POA: Diagnosis not present

## 2016-12-10 DIAGNOSIS — M069 Rheumatoid arthritis, unspecified: Secondary | ICD-10-CM | POA: Diagnosis present

## 2016-12-10 DIAGNOSIS — I499 Cardiac arrhythmia, unspecified: Secondary | ICD-10-CM | POA: Diagnosis not present

## 2016-12-10 DIAGNOSIS — E785 Hyperlipidemia, unspecified: Secondary | ICD-10-CM | POA: Diagnosis not present

## 2016-12-10 DIAGNOSIS — E876 Hypokalemia: Secondary | ICD-10-CM | POA: Diagnosis not present

## 2016-12-10 DIAGNOSIS — G4730 Sleep apnea, unspecified: Secondary | ICD-10-CM | POA: Diagnosis present

## 2016-12-10 DIAGNOSIS — I2510 Atherosclerotic heart disease of native coronary artery without angina pectoris: Secondary | ICD-10-CM | POA: Diagnosis not present

## 2016-12-10 DIAGNOSIS — G8918 Other acute postprocedural pain: Secondary | ICD-10-CM | POA: Diagnosis not present

## 2016-12-10 HISTORY — PX: TOTAL KNEE ARTHROPLASTY: SHX125

## 2016-12-10 HISTORY — DX: Unilateral primary osteoarthritis, right knee: M17.11

## 2016-12-10 LAB — PROTIME-INR
INR: 1.05
PROTHROMBIN TIME: 13.8 s (ref 11.4–15.2)

## 2016-12-10 LAB — BASIC METABOLIC PANEL
Anion gap: 9 (ref 5–15)
BUN: 32 mg/dL — AB (ref 6–20)
CHLORIDE: 106 mmol/L (ref 101–111)
CO2: 25 mmol/L (ref 22–32)
CREATININE: 1.93 mg/dL — AB (ref 0.44–1.00)
Calcium: 10.2 mg/dL (ref 8.9–10.3)
GFR calc Af Amer: 28 mL/min — ABNORMAL LOW (ref >60)
GFR calc non Af Amer: 24 mL/min — ABNORMAL LOW (ref >60)
Glucose, Bld: 129 mg/dL — ABNORMAL HIGH (ref 65–99)
Potassium: 3.3 mmol/L — ABNORMAL LOW (ref 3.5–5.1)
Sodium: 140 mmol/L (ref 135–145)

## 2016-12-10 LAB — GLUCOSE, CAPILLARY
GLUCOSE-CAPILLARY: 130 mg/dL — AB (ref 65–99)
GLUCOSE-CAPILLARY: 145 mg/dL — AB (ref 65–99)
Glucose-Capillary: 122 mg/dL — ABNORMAL HIGH (ref 65–99)
Glucose-Capillary: 106 mg/dL — ABNORMAL HIGH (ref 65–99)

## 2016-12-10 LAB — CBC
HCT: 33.8 % — ABNORMAL LOW (ref 36.0–46.0)
Hemoglobin: 10.9 g/dL — ABNORMAL LOW (ref 12.0–15.0)
MCH: 27.9 pg (ref 26.0–34.0)
MCHC: 32.2 g/dL (ref 30.0–36.0)
MCV: 86.4 fL (ref 78.0–100.0)
PLATELETS: 299 10*3/uL (ref 150–400)
RBC: 3.91 MIL/uL (ref 3.87–5.11)
RDW: 14.7 % (ref 11.5–15.5)
WBC: 8.3 10*3/uL (ref 4.0–10.5)

## 2016-12-10 LAB — TYPE AND SCREEN
ABO/RH(D): O POS
Antibody Screen: NEGATIVE

## 2016-12-10 LAB — APTT: aPTT: 28 seconds (ref 24–36)

## 2016-12-10 SURGERY — ARTHROPLASTY, KNEE, TOTAL
Anesthesia: General | Laterality: Right

## 2016-12-10 MED ORDER — BISACODYL 10 MG RE SUPP: 10.0000 mg | Freq: Every day | RECTAL | Status: DC | PRN

## 2016-12-10 MED ORDER — DILTIAZEM HCL ER COATED BEADS 240 MG PO CP24
240.0000 mg | ORAL_CAPSULE | Freq: Every day | ORAL | Status: DC
  Administered 2016-12-10 – 2016-12-14 (×5): 240 mg via ORAL
  Filled 2016-12-10 (×5): qty 1

## 2016-12-10 MED ORDER — ACETAMINOPHEN 500 MG PO TABS
1000.0000 mg | ORAL_TABLET | Freq: Four times a day (QID) | ORAL | Status: AC
  Administered 2016-12-10: 1000 mg via ORAL
  Filled 2016-12-10: qty 2

## 2016-12-10 MED ORDER — POTASSIUM CHLORIDE CRYS ER 20 MEQ PO TBCR
20.0000 meq | EXTENDED_RELEASE_TABLET | Freq: Every day | ORAL | Status: DC
  Administered 2016-12-10 – 2016-12-14 (×5): 20 meq via ORAL
  Filled 2016-12-10 (×5): qty 1

## 2016-12-10 MED ORDER — EPHEDRINE 5 MG/ML INJ
INTRAVENOUS | Status: AC
  Filled 2016-12-10: qty 10

## 2016-12-10 MED ORDER — FOLIC ACID 1 MG PO TABS
1.0000 mg | ORAL_TABLET | Freq: Every evening | ORAL | Status: DC
  Administered 2016-12-10 – 2016-12-13 (×4): 1 mg via ORAL
  Filled 2016-12-10 (×4): qty 1

## 2016-12-10 MED ORDER — 0.9 % SODIUM CHLORIDE (POUR BTL) OPTIME
TOPICAL | Status: DC | PRN
  Administered 2016-12-10: 1000 mL

## 2016-12-10 MED ORDER — IPRATROPIUM-ALBUTEROL 0.5-2.5 (3) MG/3ML IN SOLN: 3.0000 mL | Freq: Four times a day (QID) | RESPIRATORY_TRACT | Status: DC | PRN

## 2016-12-10 MED ORDER — METOCLOPRAMIDE HCL 5 MG/ML IJ SOLN: 5.0000 mg | Freq: Three times a day (TID) | INTRAMUSCULAR | Status: DC | PRN

## 2016-12-10 MED ORDER — NEOSTIGMINE METHYLSULFATE 10 MG/10ML IV SOLN
INTRAVENOUS | Status: DC | PRN
  Administered 2016-12-10: 3 mg via INTRAVENOUS

## 2016-12-10 MED ORDER — TRANEXAMIC ACID 1000 MG/10ML IV SOLN
1000.0000 mg | INTRAVENOUS | Status: AC
  Administered 2016-12-10: 1000 mg via INTRAVENOUS
  Filled 2016-12-10: qty 10

## 2016-12-10 MED ORDER — EPHEDRINE SULFATE 50 MG/ML IJ SOLN
INTRAMUSCULAR | Status: DC | PRN
Start: 2016-12-10 — End: 2016-12-10
  Administered 2016-12-10 (×2): 10 mg via INTRAVENOUS

## 2016-12-10 MED ORDER — INSULIN ASPART 100 UNIT/ML ~~LOC~~ SOLN
0.0000 [IU] | Freq: Three times a day (TID) | SUBCUTANEOUS | Status: DC
  Administered 2016-12-10 – 2016-12-11 (×2): 1 [IU] via SUBCUTANEOUS
  Administered 2016-12-11: 2 [IU] via SUBCUTANEOUS
  Administered 2016-12-12: 1 [IU] via SUBCUTANEOUS
  Administered 2016-12-13: 2 [IU] via SUBCUTANEOUS

## 2016-12-10 MED ORDER — PROPOFOL 10 MG/ML IV BOLUS
INTRAVENOUS | Status: DC | PRN
  Administered 2016-12-10: 70 mg via INTRAVENOUS

## 2016-12-10 MED ORDER — SODIUM CHLORIDE 0.9 % IR SOLN
Status: DC | PRN
  Administered 2016-12-10: 3000 mL

## 2016-12-10 MED ORDER — MENTHOL 3 MG MT LOZG: 1.0000 | LOZENGE | OROMUCOSAL | Status: DC | PRN

## 2016-12-10 MED ORDER — MIRABEGRON ER 25 MG PO TB24
50.0000 mg | ORAL_TABLET | Freq: Every day | ORAL | Status: DC
Start: 2016-12-11 — End: 2016-12-14
  Administered 2016-12-11 – 2016-12-14 (×4): 50 mg via ORAL
  Filled 2016-12-10 (×4): qty 2

## 2016-12-10 MED ORDER — HYDROCODONE-ACETAMINOPHEN 7.5-325 MG PO TABS
1.0000 | ORAL_TABLET | Freq: Four times a day (QID) | ORAL | Status: DC
  Administered 2016-12-10: 1 via ORAL
  Filled 2016-12-10: qty 1

## 2016-12-10 MED ORDER — ACETAMINOPHEN 500 MG PO TABS
ORAL_TABLET | ORAL | Status: AC
  Administered 2016-12-10: 1000 mg via ORAL
  Filled 2016-12-10: qty 2

## 2016-12-10 MED ORDER — BUPIVACAINE-EPINEPHRINE 0.5% -1:200000 IJ SOLN
INTRAMUSCULAR | Status: DC | PRN
  Administered 2016-12-10: 20 mL

## 2016-12-10 MED ORDER — FENTANYL CITRATE (PF) 100 MCG/2ML IJ SOLN
INTRAMUSCULAR | Status: DC | PRN
  Administered 2016-12-10: 50 ug via INTRAVENOUS
  Administered 2016-12-10: 25 ug via INTRAVENOUS
  Administered 2016-12-10 (×2): 50 ug via INTRAVENOUS

## 2016-12-10 MED ORDER — PANTOPRAZOLE SODIUM 40 MG PO TBEC
40.0000 mg | DELAYED_RELEASE_TABLET | Freq: Every day | ORAL | Status: DC
  Administered 2016-12-11 – 2016-12-14 (×4): 40 mg via ORAL
  Filled 2016-12-10 (×4): qty 1

## 2016-12-10 MED ORDER — BUPIVACAINE HCL (PF) 0.5 % IJ SOLN
INTRAMUSCULAR | Status: AC
  Filled 2016-12-10: qty 30

## 2016-12-10 MED ORDER — GABAPENTIN 300 MG PO CAPS
ORAL_CAPSULE | ORAL | Status: AC
  Administered 2016-12-10: 300 mg via ORAL
  Filled 2016-12-10: qty 1

## 2016-12-10 MED ORDER — GABAPENTIN 100 MG PO CAPS: 100.0000 mg | ORAL_CAPSULE | Freq: Three times a day (TID) | ORAL | Status: DC

## 2016-12-10 MED ORDER — HYPROMELLOSE (GONIOSCOPIC) 2.5 % OP SOLN
1.0000 [drp] | Freq: Three times a day (TID) | OPHTHALMIC | Status: DC | PRN
  Filled 2016-12-10: qty 15

## 2016-12-10 MED ORDER — FLEET ENEMA 7-19 GM/118ML RE ENEM: 1.0000 | ENEMA | Freq: Once | RECTAL | Status: DC | PRN

## 2016-12-10 MED ORDER — FERROUS SULFATE 325 (65 FE) MG PO TABS
325.0000 mg | ORAL_TABLET | Freq: Two times a day (BID) | ORAL | Status: DC
  Administered 2016-12-10 – 2016-12-14 (×8): 325 mg via ORAL
  Filled 2016-12-10 (×8): qty 1

## 2016-12-10 MED ORDER — DOCUSATE SODIUM 100 MG PO CAPS
100.0000 mg | ORAL_CAPSULE | Freq: Two times a day (BID) | ORAL | Status: DC
  Administered 2016-12-10 – 2016-12-14 (×9): 100 mg via ORAL
  Filled 2016-12-10 (×9): qty 1

## 2016-12-10 MED ORDER — PROPOFOL 10 MG/ML IV BOLUS
INTRAVENOUS | Status: AC
Start: 2016-12-10 — End: 2016-12-10
  Filled 2016-12-10: qty 20

## 2016-12-10 MED ORDER — ARIPIPRAZOLE 5 MG PO TABS
5.0000 mg | ORAL_TABLET | Freq: Every evening | ORAL | Status: DC
  Administered 2016-12-10 – 2016-12-13 (×4): 5 mg via ORAL
  Filled 2016-12-10 (×4): qty 1

## 2016-12-10 MED ORDER — MIDAZOLAM HCL 2 MG/2ML IJ SOLN
INTRAMUSCULAR | Status: AC
  Filled 2016-12-10: qty 2

## 2016-12-10 MED ORDER — ONDANSETRON HCL 4 MG/2ML IJ SOLN: 4.0000 mg | Freq: Four times a day (QID) | INTRAMUSCULAR | Status: DC | PRN

## 2016-12-10 MED ORDER — PROPOFOL 1000 MG/100ML IV EMUL
INTRAVENOUS | Status: AC
  Filled 2016-12-10: qty 200

## 2016-12-10 MED ORDER — ESCITALOPRAM OXALATE 10 MG PO TABS
10.0000 mg | ORAL_TABLET | Freq: Every day | ORAL | Status: DC
  Administered 2016-12-11 – 2016-12-14 (×4): 10 mg via ORAL
  Filled 2016-12-10 (×4): qty 1

## 2016-12-10 MED ORDER — ACETAMINOPHEN 325 MG PO TABS
650.0000 mg | ORAL_TABLET | Freq: Four times a day (QID) | ORAL | Status: DC | PRN
  Administered 2016-12-11 – 2016-12-12 (×2): 650 mg via ORAL
  Filled 2016-12-10 (×2): qty 2

## 2016-12-10 MED ORDER — POTASSIUM CHLORIDE IN NACL 20-0.9 MEQ/L-% IV SOLN
INTRAVENOUS | Status: DC
  Administered 2016-12-10: 14:00:00 via INTRAVENOUS
  Administered 2016-12-11: 100 mL/h via INTRAVENOUS
  Administered 2016-12-11 – 2016-12-12 (×3): via INTRAVENOUS
  Filled 2016-12-10 (×5): qty 1000

## 2016-12-10 MED ORDER — VITAMIN D (ERGOCALCIFEROL) 1.25 MG (50000 UNIT) PO CAPS: 50000.0000 [IU] | ORAL_CAPSULE | ORAL | Status: DC

## 2016-12-10 MED ORDER — CLONIDINE HCL 0.2 MG PO TABS
0.2000 mg | ORAL_TABLET | Freq: Two times a day (BID) | ORAL | Status: DC
  Administered 2016-12-10 – 2016-12-14 (×8): 0.2 mg via ORAL
  Filled 2016-12-10 (×8): qty 1

## 2016-12-10 MED ORDER — PREGABALIN 50 MG PO CAPS
50.0000 mg | ORAL_CAPSULE | Freq: Two times a day (BID) | ORAL | Status: DC
Start: 2016-12-10 — End: 2016-12-10

## 2016-12-10 MED ORDER — SODIUM CHLORIDE 0.9 % IV SOLN
INTRAVENOUS | Status: DC | PRN
  Administered 2016-12-10: 07:00:00 via INTRAVENOUS

## 2016-12-10 MED ORDER — ROCURONIUM BROMIDE 50 MG/5ML IV SOSY
PREFILLED_SYRINGE | INTRAVENOUS | Status: AC
  Filled 2016-12-10: qty 5

## 2016-12-10 MED ORDER — ACETAMINOPHEN 500 MG PO TABS: 1000.0000 mg | ORAL_TABLET | Freq: Four times a day (QID) | ORAL | Status: DC | PRN

## 2016-12-10 MED ORDER — FAMOTIDINE IN NACL 20-0.9 MG/50ML-% IV SOLN
20.0000 mg | Freq: Two times a day (BID) | INTRAVENOUS | Status: AC
  Administered 2016-12-10 – 2016-12-11 (×3): 20 mg via INTRAVENOUS
  Filled 2016-12-10 (×3): qty 50

## 2016-12-10 MED ORDER — PREGABALIN 50 MG PO CAPS
50.0000 mg | ORAL_CAPSULE | Freq: Every day | ORAL | Status: DC
  Administered 2016-12-11 – 2016-12-13 (×3): 50 mg via ORAL
  Filled 2016-12-10 (×4): qty 1

## 2016-12-10 MED ORDER — ATORVASTATIN CALCIUM 80 MG PO TABS
80.0000 mg | ORAL_TABLET | Freq: Every day | ORAL | Status: DC
  Administered 2016-12-10 – 2016-12-13 (×4): 80 mg via ORAL
  Filled 2016-12-10 (×4): qty 1

## 2016-12-10 MED ORDER — ROCURONIUM BROMIDE 100 MG/10ML IV SOLN
INTRAVENOUS | Status: DC | PRN
  Administered 2016-12-10: 50 mg via INTRAVENOUS

## 2016-12-10 MED ORDER — ONDANSETRON HCL 4 MG/2ML IJ SOLN
INTRAMUSCULAR | Status: DC | PRN
  Administered 2016-12-10: 4 mg via INTRAVENOUS

## 2016-12-10 MED ORDER — HYDROMORPHONE HCL 1 MG/ML IJ SOLN: 0.2500 mg | INTRAMUSCULAR | Status: DC | PRN

## 2016-12-10 MED ORDER — MIDAZOLAM HCL 5 MG/5ML IJ SOLN
INTRAMUSCULAR | Status: DC | PRN
  Administered 2016-12-10: 1 mg via INTRAVENOUS

## 2016-12-10 MED ORDER — GABAPENTIN 300 MG PO CAPS: 300.0000 mg | ORAL_CAPSULE | Freq: Once | ORAL | Status: DC

## 2016-12-10 MED ORDER — CEFAZOLIN SODIUM-DEXTROSE 2-4 GM/100ML-% IV SOLN
2.0000 g | INTRAVENOUS | Status: AC
  Administered 2016-12-10: 2 g via INTRAVENOUS
  Filled 2016-12-10: qty 100

## 2016-12-10 MED ORDER — POLYETHYLENE GLYCOL 3350 17 G PO PACK: 17.0000 g | PACK | Freq: Every day | ORAL | Status: DC | PRN

## 2016-12-10 MED ORDER — ACETAMINOPHEN 650 MG RE SUPP: 650.0000 mg | Freq: Four times a day (QID) | RECTAL | Status: DC | PRN

## 2016-12-10 MED ORDER — GLYCOPYRROLATE 0.2 MG/ML IJ SOLN
INTRAMUSCULAR | Status: DC | PRN
Start: 2016-12-10 — End: 2016-12-10
  Administered 2016-12-10: 0.4 mg via INTRAVENOUS

## 2016-12-10 MED ORDER — DABIGATRAN ETEXILATE MESYLATE 150 MG PO CAPS
150.0000 mg | ORAL_CAPSULE | ORAL | Status: DC
  Administered 2016-12-11 – 2016-12-13 (×2): 150 mg via ORAL
  Filled 2016-12-10 (×2): qty 1

## 2016-12-10 MED ORDER — METHOCARBAMOL 500 MG PO TABS
500.0000 mg | ORAL_TABLET | Freq: Four times a day (QID) | ORAL | Status: DC | PRN
Start: 2016-12-10 — End: 2016-12-11

## 2016-12-10 MED ORDER — PHENOL 1.4 % MT LIQD: 1.0000 | OROMUCOSAL | Status: DC | PRN

## 2016-12-10 MED ORDER — ACETAMINOPHEN 500 MG PO TABS: 1000.0000 mg | ORAL_TABLET | Freq: Once | ORAL | Status: DC

## 2016-12-10 MED ORDER — METOCLOPRAMIDE HCL 5 MG PO TABS: 5.0000 mg | ORAL_TABLET | Freq: Three times a day (TID) | ORAL | Status: DC | PRN

## 2016-12-10 MED ORDER — BUPIVACAINE LIPOSOME 1.3 % IJ SUSP
INTRAMUSCULAR | Status: DC | PRN
  Administered 2016-12-10: 08:00:00

## 2016-12-10 MED ORDER — FENTANYL CITRATE (PF) 100 MCG/2ML IJ SOLN
INTRAMUSCULAR | Status: AC
  Filled 2016-12-10: qty 4

## 2016-12-10 MED ORDER — FUROSEMIDE 40 MG PO TABS
40.0000 mg | ORAL_TABLET | Freq: Every day | ORAL | Status: DC
  Administered 2016-12-10 – 2016-12-14 (×5): 40 mg via ORAL
  Filled 2016-12-10 (×5): qty 1

## 2016-12-10 MED ORDER — ROPIVACAINE HCL 7.5 MG/ML IJ SOLN
INTRAMUSCULAR | Status: DC | PRN
  Administered 2016-12-10: 20 mL via PERINEURAL

## 2016-12-10 MED ORDER — ONDANSETRON HCL 4 MG PO TABS: 4.0000 mg | ORAL_TABLET | Freq: Four times a day (QID) | ORAL | Status: DC | PRN

## 2016-12-10 MED ORDER — PREGABALIN 100 MG PO CAPS
100.0000 mg | ORAL_CAPSULE | Freq: Every day | ORAL | Status: DC
  Administered 2016-12-10 – 2016-12-13 (×5): 100 mg via ORAL
  Administered 2016-12-14: 50 mg via ORAL
  Filled 2016-12-10 (×4): qty 1

## 2016-12-10 MED ORDER — CHLORHEXIDINE GLUCONATE 4 % EX LIQD: 60.0000 mL | Freq: Once | CUTANEOUS | Status: DC

## 2016-12-10 MED ORDER — METHOCARBAMOL 1000 MG/10ML IJ SOLN
500.0000 mg | Freq: Four times a day (QID) | INTRAVENOUS | Status: DC | PRN
  Filled 2016-12-10: qty 5

## 2016-12-10 MED ORDER — EPINEPHRINE PF 1 MG/ML IJ SOLN
INTRAMUSCULAR | Status: AC
  Filled 2016-12-10: qty 1

## 2016-12-10 MED ORDER — PRAMIPEXOLE DIHYDROCHLORIDE 0.125 MG PO TABS
0.5000 mg | ORAL_TABLET | Freq: Every day | ORAL | Status: DC
  Administered 2016-12-10 – 2016-12-13 (×4): 0.5 mg via ORAL
  Filled 2016-12-10 (×5): qty 4

## 2016-12-10 MED ORDER — LIDOCAINE 2% (20 MG/ML) 5 ML SYRINGE
INTRAMUSCULAR | Status: AC
  Filled 2016-12-10: qty 5

## 2016-12-10 MED ORDER — HYDROCODONE-ACETAMINOPHEN 5-325 MG PO TABS
1.0000 | ORAL_TABLET | ORAL | Status: DC | PRN
  Administered 2016-12-10: 1 via ORAL
  Administered 2016-12-12 – 2016-12-13 (×2): 2 via ORAL
  Administered 2016-12-13 (×2): 1 via ORAL
  Administered 2016-12-14: 2 via ORAL
  Filled 2016-12-10: qty 2
  Filled 2016-12-10: qty 1
  Filled 2016-12-10 (×3): qty 2
  Filled 2016-12-10: qty 1

## 2016-12-10 MED ORDER — BUPIVACAINE LIPOSOME 1.3 % IJ SUSP
20.0000 mL | INTRAMUSCULAR | Status: DC
  Filled 2016-12-10: qty 20

## 2016-12-10 MED ORDER — ALUM & MAG HYDROXIDE-SIMETH 200-200-20 MG/5ML PO SUSP: 30.0000 mL | ORAL | Status: DC | PRN

## 2016-12-10 MED ORDER — MORPHINE SULFATE (PF) 2 MG/ML IV SOLN: 1.0000 mg | INTRAVENOUS | Status: DC | PRN

## 2016-12-10 SURGICAL SUPPLY — 79 items
ADH SKN CLS APL DERMABOND .7 (GAUZE/BANDAGES/DRESSINGS) ×2
APL SKNCLS STERI-STRIP NONHPOA (GAUZE/BANDAGES/DRESSINGS) ×1
BANDAGE ACE 4X5 VEL STRL LF (GAUZE/BANDAGES/DRESSINGS) ×3 IMPLANT
BANDAGE ESMARK 6X9 LF (GAUZE/BANDAGES/DRESSINGS) ×1 IMPLANT
BENZOIN TINCTURE PRP APPL 2/3 (GAUZE/BANDAGES/DRESSINGS) ×3 IMPLANT
BLADE SAG 18X100X1.27 (BLADE) ×3 IMPLANT
BLADE SAW SGTL 13X75X1.27 (BLADE) ×3 IMPLANT
BNDG CMPR 9X6 STRL LF SNTH (GAUZE/BANDAGES/DRESSINGS) ×1
BNDG CMPR MD 5X2 ELC HKLP STRL (GAUZE/BANDAGES/DRESSINGS) ×1
BNDG CMPR MED 10X6 ELC LF (GAUZE/BANDAGES/DRESSINGS) ×1
BNDG ELASTIC 2 VLCR STRL LF (GAUZE/BANDAGES/DRESSINGS) ×2 IMPLANT
BNDG ELASTIC 6X10 VLCR STRL LF (GAUZE/BANDAGES/DRESSINGS) ×3 IMPLANT
BNDG ESMARK 6X9 LF (GAUZE/BANDAGES/DRESSINGS) ×3
BOWL SMART MIX CTS (DISPOSABLE) ×3 IMPLANT
CAP KNEE TOTAL 3 SIGMA ×2 IMPLANT
CEMENT HV SMART SET (Cement) ×6 IMPLANT
CLOSURE STERI-STRIP 1/2X4 (GAUZE/BANDAGES/DRESSINGS) ×1
CLOSURE WOUND 1/2 X4 (GAUZE/BANDAGES/DRESSINGS) ×2
CLSR STERI-STRIP ANTIMIC 1/2X4 (GAUZE/BANDAGES/DRESSINGS) ×1 IMPLANT
COVER SURGICAL LIGHT HANDLE (MISCELLANEOUS) ×3 IMPLANT
CUFF TOURNIQUET SINGLE 34IN LL (TOURNIQUET CUFF) ×3 IMPLANT
CUFF TOURNIQUET SINGLE 44IN (TOURNIQUET CUFF) IMPLANT
DERMABOND ADVANCED (GAUZE/BANDAGES/DRESSINGS) ×4
DERMABOND ADVANCED .7 DNX12 (GAUZE/BANDAGES/DRESSINGS) ×2 IMPLANT
DRAPE ORTHO SPLIT 77X108 STRL (DRAPES) ×6
DRAPE SURG ORHT 6 SPLT 77X108 (DRAPES) ×2 IMPLANT
DRAPE U-SHAPE 47X51 STRL (DRAPES) ×3 IMPLANT
DRSG ADAPTIC 3X8 NADH LF (GAUZE/BANDAGES/DRESSINGS) ×3 IMPLANT
DRSG PAD ABDOMINAL 8X10 ST (GAUZE/BANDAGES/DRESSINGS) ×3 IMPLANT
DURAPREP 26ML APPLICATOR (WOUND CARE) ×3 IMPLANT
ELECT REM PT RETURN 9FT ADLT (ELECTROSURGICAL) ×3
ELECTRODE REM PT RTRN 9FT ADLT (ELECTROSURGICAL) ×1 IMPLANT
EVACUATOR 1/8 PVC DRAIN (DRAIN) IMPLANT
FACESHIELD WRAPAROUND (MASK) ×6 IMPLANT
FACESHIELD WRAPAROUND OR TEAM (MASK) ×2 IMPLANT
GAUZE SPONGE 4X4 12PLY STRL (GAUZE/BANDAGES/DRESSINGS) ×3 IMPLANT
GLOVE BIOGEL PI IND STRL 8 (GLOVE) ×1 IMPLANT
GLOVE BIOGEL PI INDICATOR 8 (GLOVE) ×2
GLOVE ECLIPSE 9.0 STRL (GLOVE) ×1 IMPLANT
GLOVE ORTHO TXT STRL SZ7.5 (GLOVE) ×1 IMPLANT
GLOVE SURG 8.5 LATEX PF (GLOVE) ×1 IMPLANT
GOWN STRL REUS W/ TWL LRG LVL3 (GOWN DISPOSABLE) ×1 IMPLANT
GOWN STRL REUS W/TWL 2XL LVL3 (GOWN DISPOSABLE) ×6 IMPLANT
GOWN STRL REUS W/TWL LRG LVL3 (GOWN DISPOSABLE) ×6
HANDPIECE INTERPULSE COAX TIP (DISPOSABLE) ×3
IMMOBILIZER KNEE 22 UNIV (SOFTGOODS) ×2 IMPLANT
KIT BASIN OR (CUSTOM PROCEDURE TRAY) ×3 IMPLANT
KIT ROOM TURNOVER OR (KITS) ×3 IMPLANT
MANIFOLD NEPTUNE II (INSTRUMENTS) ×3 IMPLANT
NDL HYPO 25X1 1.5 SAFETY (NEEDLE) IMPLANT
NEEDLE HYPO 25X1 1.5 SAFETY (NEEDLE) IMPLANT
NS IRRIG 1000ML POUR BTL (IV SOLUTION) ×3 IMPLANT
PACK TOTAL JOINT (CUSTOM PROCEDURE TRAY) ×3 IMPLANT
PAD ARMBOARD 7.5X6 YLW CONV (MISCELLANEOUS) ×6 IMPLANT
PAD CAST 4YDX4 CTTN HI CHSV (CAST SUPPLIES) ×1 IMPLANT
PADDING CAST ABS 6INX4YD NS (CAST SUPPLIES) ×2
PADDING CAST ABS COTTON 6X4 NS (CAST SUPPLIES) IMPLANT
PADDING CAST COTTON 4X4 STRL (CAST SUPPLIES) ×3
PADDING CAST COTTON 6X4 STRL (CAST SUPPLIES) ×3 IMPLANT
SET HNDPC FAN SPRY TIP SCT (DISPOSABLE) ×1 IMPLANT
SPONGE GAUZE 4X4 12PLY STER LF (GAUZE/BANDAGES/DRESSINGS) ×2 IMPLANT
STAPLER VISISTAT 35W (STAPLE) IMPLANT
STRIP CLOSURE SKIN 1/2X4 (GAUZE/BANDAGES/DRESSINGS) ×4 IMPLANT
SUCTION FRAZIER HANDLE 10FR (MISCELLANEOUS)
SUCTION TUBE FRAZIER 10FR DISP (MISCELLANEOUS) IMPLANT
SUT BONE WAX W31G (SUTURE) ×2 IMPLANT
SUT VIC AB 0 CT1 27 (SUTURE) ×6
SUT VIC AB 0 CT1 27XBRD ANBCTR (SUTURE) ×2 IMPLANT
SUT VIC AB 1 CT1 27 (SUTURE) ×6
SUT VIC AB 1 CT1 27XBRD ANBCTR (SUTURE) ×2 IMPLANT
SUT VIC AB 2-0 CT1 27 (SUTURE) ×9
SUT VIC AB 2-0 CT1 TAPERPNT 27 (SUTURE) ×3 IMPLANT
SUT VICRYL 4-0 PS2 18IN ABS (SUTURE) ×3 IMPLANT
SYR CONTROL 10ML LL (SYRINGE) IMPLANT
TOWEL OR 17X24 6PK STRL BLUE (TOWEL DISPOSABLE) ×3 IMPLANT
TOWEL OR 17X26 10 PK STRL BLUE (TOWEL DISPOSABLE) ×3 IMPLANT
TRAY CATH 16FR W/PLASTIC CATH (SET/KITS/TRAYS/PACK) IMPLANT
TRAY FOLEY CATH 16FR SILVER (SET/KITS/TRAYS/PACK) IMPLANT
WATER STERILE IRR 1000ML POUR (IV SOLUTION) ×1 IMPLANT

## 2016-12-10 NOTE — Progress Notes (Signed)
Orthopedic Tech Progress Note Patient Details:  JALEEYAH MUNCE 07/09/1941 [id number]  Ortho Devices Type of Ortho Device: Other (comment) Ortho Device/Splint Location: Provided bone foam zero degree for Right Knee/leg. (Right knee/leg).  Placed at bedside. Nurse Andi Hence at bedside.   Kristopher Oppenheim 12/10/2016, 12:03 PM

## 2016-12-10 NOTE — Anesthesia Procedure Notes (Signed)
Anesthesia Regional Block:  Adductor canal block  Pre-Anesthetic Checklist: ,, timeout performed, Correct Patient, Correct Site, Correct Laterality, Correct Procedure, Correct Position, site marked, Risks and benefits discussed, pre-op evaluation,  At surgeon's request and post-op pain management  Laterality: Right  Prep: Maximum Sterile Barrier Precautions used, chloraprep       Needles:  Injection technique: Single-shot  Needle Type: Echogenic Stimulator Needle     Needle Length: 9cm 9 cm Needle Gauge: 21 and 21 G    Additional Needles:  Procedures: ultrasound guided (picture in chart) Adductor canal block Narrative:  Start time: 12/10/2016 7:35 AM End time: 12/10/2016 7:45 AM Injection made incrementally with aspirations every 5 mL. Anesthesiologist: Roderic Palau  Additional Notes: 2% Lidocaine skin wheel.

## 2016-12-10 NOTE — Anesthesia Procedure Notes (Signed)
Procedure Name: Intubation Date/Time: 12/10/2016 8:00 AM Performed by: Lance Coon Pre-anesthesia Checklist: Patient identified, Emergency Drugs available, Suction available, Patient being monitored and Timeout performed Patient Re-evaluated:Patient Re-evaluated prior to inductionOxygen Delivery Method: Circle system utilized Preoxygenation: Pre-oxygenation with 100% oxygen Intubation Type: IV induction Ventilation: Mask ventilation without difficulty and Oral airway inserted - appropriate to patient size Laryngoscope Size: Kirley and 3 Grade View: Grade I Tube type: Oral Tube size: 7.5 mm Number of attempts: 1 Airway Equipment and Method: Stylet Placement Confirmation: ETT inserted through vocal cords under direct vision,  positive ETCO2 and breath sounds checked- equal and bilateral Secured at: 21 cm Tube secured with: Tape Dental Injury: Teeth and Oropharynx as per pre-operative assessment

## 2016-12-10 NOTE — Discharge Instructions (Addendum)
° ° °Keep knee incision dry for 5 days post op then may wet while bathing. °Therapy daily and CPM goal full extension and greater than 90 degrees flexion. °Call if fever or chills or increased drainage. °Go to ER if acutely short of breath or call for ambulance. °Return for follow up in 2 weeks. °May full weight bear on the surgical leg unless told otherwise. °Use knee immobilizer until able to straight leg raise off bed with knee stable. °In house walking for first 2 weeks. °INSTRUCTIONS AFTER JOINT REPLACEMENT  ° °o Remove items at home which could result in a fall. This includes throw rugs or furniture in walking pathways °o ICE to the affected joint every three hours while awake for 30 minutes at a time, for at least the first 3-5 days, and then as needed for pain and swelling.  Continue to use ice for pain and swelling. You may notice swelling that will progress down to the foot and ankle.  This is normal after surgery.  Elevate your leg when you are not up walking on it.   °o Continue to use the breathing machine you got in the hospital (incentive spirometer) which will help keep your temperature down.  It is common for your temperature to cycle up and down following surgery, especially at night when you are not up moving around and exerting yourself.  The breathing machine keeps your lungs expanded and your temperature down. ° ° °DIET:  As you were doing prior to hospitalization, we recommend a well-balanced diet. ° °DRESSING / WOUND CARE / SHOWERING ° °You may change your dressing 3-5 days after surgery.  Then change the dressing every day with sterile gauze.  Please use good hand washing techniques before changing the dressing.  Do not use any lotions or creams on the incision until instructed by your surgeon. ° °ACTIVITY ° °o Increase activity slowly as tolerated, but follow the weight bearing instructions below.   °o No driving for 6 weeks or until further direction given by your physician.  You cannot  drive while taking narcotics.  °o No lifting or carrying greater than 10 lbs. until further directed by your surgeon. °o Avoid periods of inactivity such as sitting longer than an hour when not asleep. This helps prevent blood clots.  °o You may return to work once you are authorized by your doctor.  ° ° ° °WEIGHT BEARING  ° °Weight bearing as tolerated with assist device (walker, cane, etc) as directed, use it as long as suggested by your surgeon or therapist, typically at least 4-6 weeks. ° ° °EXERCISES ° °Results after joint replacement surgery are often greatly improved when you follow the exercise, range of motion and muscle strengthening exercises prescribed by your doctor. Safety measures are also important to protect the joint from further injury. Any time any of these exercises cause you to have increased pain or swelling, decrease what you are doing until you are comfortable again and then slowly increase them. If you have problems or questions, call your caregiver or physical therapist for advice.  ° °Rehabilitation is important following a joint replacement. After just a few days of immobilization, the muscles of the leg can become weakened and shrink (atrophy).  These exercises are designed to build up the tone and strength of the thigh and leg muscles and to improve motion. Often times heat used for twenty to thirty minutes before working out will loosen up your tissues and help with improving the range   of motion but do not use heat for the first two weeks following surgery (sometimes heat can increase post-operative swelling).  ° °These exercises can be done on a training (exercise) mat, on the floor, on a table or on a bed. Use whatever works the best and is most comfortable for you.    Use music or television while you are exercising so that the exercises are a pleasant break in your day. This will make your life better with the exercises acting as a break in your routine that you can look forward  to.   Perform all exercises about fifteen times, three times per day or as directed.  You should exercise both the operative leg and the other leg as well. ° °Exercises include: °  °• Quad Sets - Tighten up the muscle on the front of the thigh (Quad) and hold for 5-10 seconds.   °• Straight Leg Raises - With your knee straight (if you were given a brace, keep it on), lift the leg to 60 degrees, hold for 3 seconds, and slowly lower the leg.  Perform this exercise against resistance later as your leg gets stronger.  °• Leg Slides: Lying on your back, slowly slide your foot toward your buttocks, bending your knee up off the floor (only go as far as is comfortable). Then slowly slide your foot back down until your leg is flat on the floor again.  °• Angel Wings: Lying on your back spread your legs to the side as far apart as you can without causing discomfort.  °• Hamstring Strength:  Lying on your back, push your heel against the floor with your leg straight by tightening up the muscles of your buttocks.  Repeat, but this time bend your knee to a comfortable angle, and push your heel against the floor.  You may put a pillow under the heel to make it more comfortable if necessary.  ° °A rehabilitation program following joint replacement surgery can speed recovery and prevent re-injury in the future due to weakened muscles. Contact your doctor or a physical therapist for more information on knee rehabilitation.  ° ° °CONSTIPATION ° °Constipation is defined medically as fewer than three stools per week and severe constipation as less than one stool per week.  Even if you have a regular bowel pattern at home, your normal regimen is likely to be disrupted due to multiple reasons following surgery.  Combination of anesthesia, postoperative narcotics, change in appetite and fluid intake all can affect your bowels.  ° °YOU MUST use at least one of the following options; they are listed in order of increasing strength to get  the job done.  They are all available over the counter, and you may need to use some, POSSIBLY even all of these options:   ° °Drink plenty of fluids (prune juice may be helpful) and high fiber foods °Colace 100 mg by mouth twice a day  °Senokot for constipation as directed and as needed Dulcolax (bisacodyl), take with full glass of water  °Miralax (polyethylene glycol) once or twice a day as needed. ° °If you have tried all these things and are unable to have a bowel movement in the first 3-4 days after surgery call either your surgeon or your primary doctor.   ° °If you experience loose stools or diarrhea, hold the medications until you stool forms back up.  If your symptoms do not get better within 1 week or if they get worse, check with your   doctor.  If you experience "the worst abdominal pain ever" or develop nausea or vomiting, please contact the office immediately for further recommendations for treatment.   ITCHING:  If you experience itching with your medications, try taking only a single pain pill, or even half a pain pill at a time.  You can also use Benadryl over the counter for itching or also to help with sleep.   TED HOSE STOCKINGS:  Use stockings on both legs until for at least 2 weeks or as directed by physician office. They may be removed at night for sleeping.  MEDICATIONS:  See your medication summary on the After Visit Summary that nursing will review with you.  You may have some home medications which will be placed on hold until you complete the course of blood thinner medication.  It is important for you to complete the blood thinner medication as prescribed.  PRECAUTIONS:  If you experience chest pain or shortness of breath - call 911 immediately for transfer to the hospital emergency department.   If you develop a fever greater that 101 F, purulent drainage from wound, increased redness or drainage from wound, foul odor from the wound/dressing, or calf pain - CONTACT YOUR  SURGEON.                                                   FOLLOW-UP APPOINTMENTS:  If you do not already have a post-op appointment, please call the office for an appointment to be seen by your surgeon.  Guidelines for how soon to be seen are listed in your After Visit Summary, but are typically between 1-4 weeks after surgery.  OTHER INSTRUCTIONS:   Knee Replacement:  Do not place pillow under knee, focus on keeping the knee straight while resting. CPM instructions: 0-90 degrees, 2 hours in the morning, 2 hours in the afternoon, and 2 hours in the evening. Place foam block, curve side up under heel at all times except when in CPM or when walking.  DO NOT modify, tear, cut, or change the foam block in any way.  MAKE SURE YOU:   Understand these instructions.   Get help right away if you are not doing well or get worse.    Thank you for letting us be a part of your medical care team.  It is a privilege we respect greatly.  We hope these instructions will help you stay on track for a fast and full recovery!   Information on my medicine - Pradaxa (dabigatran)  This medication education was reviewed with me or my healthcare representative as part of my discharge preparation.    Why was Pradaxa prescribed for you? Pradaxa was prescribed for you to reduce the risk of forming blood clots that cause a stroke if you have a medical condition called atrial fibrillation (a type of irregular heartbeat).    What do you Need to know about PradAXa? Take your Pradaxa TWICE DAILY - one capsule in the morning and one tablet in the evening with or without food.  It would be best to take the doses about the same time each day.  The capsules should not be broken, chewed or opened - they must be swallowed whole.  Do not store Pradaxa in other medication containers - once the bottle is opened the Pradaxa should be used within FOUR  months; throw away any capsules that havent been by that time.  Take  Pradaxa exactly as prescribed by your doctor.  DO NOT stop taking Pradaxa without talking to the doctor who prescribed the medication.  Stopping without other stroke prevention medication to take the place of Pradaxa may increase your risk of developing a clot that causes a stroke.  Refill your prescription before you run out.  After discharge, you should have regular check-up appointments with your healthcare provider that is prescribing your Pradaxa.  In the future your dose may need to be changed if your kidney function or weight changes by a significant amount.  What do you do if you miss a dose? If you miss a dose, take it as soon as you remember on the same day.  If your next dose is less than 6 hours away, skip the missed dose.  Do not take two doses of PRADAXA at the same time.  Important Safety Information A possible side effect of Pradaxa is bleeding. You should call your healthcare provider right away if you experience any of the following: ? Bleeding from an injury or your nose that does not stop. ? Unusual colored urine (red or dark brown) or unusual colored stools (red or black). ? Unusual bruising for unknown reasons. ? A serious fall or if you hit your head (even if there is no bleeding).  Some medicines may interact with Pradaxa and might increase your risk of bleeding or clotting while on Pradaxa. To help avoid this, consult your healthcare provider or pharmacist prior to using any new prescription or non-prescription medications, including herbals, vitamins, non-steroidal anti-inflammatory drugs (NSAIDs) and supplements.  This website has more information on Pradaxa (dabigatran): [email or web address].com  Contact Dr.Eskridge' office to arrange for a voiding trial in 10 days around 12/21/2016

## 2016-12-10 NOTE — H&P (Signed)
TOTAL KNEE ADMISSION H&P  Patient is being admitted for right total knee arthroplasty.  Subjective:  Chief Complaint:right knee pain.  HPI: Gloria Best, 76 y.o. female, has a history of pain and functional disability in the right knee due to arthritis and has failed non-surgical conservative treatments for greater than 12 weeks to includeNSAID's and/or analgesics, corticosteriod injections, use of assistive devices, weight reduction as appropriate and activity modification.  Onset of symptoms was gradual, starting 10 years ago with gradually worsening course since that time.   Patient currently rates pain in the right knee(s) at 10 out of 10 with activity. Patient has worsening of pain with activity and weight bearing, pain that interferes with activities of daily living, pain with passive range of motion and joint swelling.  Patient has evidence of subchondral sclerosis, periarticular osteophytes and joint space narrowing by imaging studies.  There is no active infection.  Patient Active Problem List   Diagnosis Date Noted  . AVN (avascular necrosis of bone) ([place]) 06/09/2012    Class: Chronic  . Osteoarthritis of right hip 06/09/2012    Class: Chronic   Past Medical History:  Diagnosis Date  . Anemia    hx of blood transfusion  . Anxiety   . Arthritis    RA  . Asthma    hx of as child  . Bladder disorder    having difficulty voiding, requires in and out catheter 2x day and prn; denied current need for I&O cath 08/27/16  . Bronchitis    hx of  . Carpal tunnel syndrome of left wrist    hx of  . Chronic back pain   . Chronic kidney disease   . Depression    takes medication for   . Diabetes mellitus    oral medication  . Dysrhythmia    takes cardizem, sees Dr. Marco Collie primary  . Fibromyalgia    takes lyrica  . GERD (gastroesophageal reflux disease)   . H/O hiatal hernia    hx of   . Heart murmur   . Hypertension   . Pneumonia    hx of  . Sleep apnea    no  CPAP. last sleep study over 10 years ago  . Urinary tract infection    hx of  . Wears dentures    top    Past Surgical History:  Procedure Laterality Date  . ABDOMINAL HYSTERECTOMY    . APPENDECTOMY    . BACK SURGERY      x 3  . CARPAL TUNNEL RELEASE     left side only  . CATARACT EXTRACTION     bilateral  . CESAREAN SECTION    . COLON SURGERY     "due to large polyp"  . COLONOSCOPY    . FOOT SURGERY    . HERNIA REPAIR    . HIP ARTHROPLASTY  06/09/2012  . TENOLYSIS Left 11/12/2013   Procedure: LEFT TENDON GRAFT RECONSTRUCTION OF LEFT LONG FINGER SAGITTAL FIBERS CORD RELEASE;  Surgeon: Cammie Sickle., MD;  Location: [place];  Service: Orthopedics;  Laterality: Left;  . TOTAL HIP ARTHROPLASTY  06/09/2012   Procedure: TOTAL HIP ARTHROPLASTY;  Surgeon: [name], MD;  Location: [facility];  Service: Orthopedics;  Laterality: Right;  Right total hip replacement    Prescriptions Prior to Admission  Medication Sig Dispense Refill Last Dose  . ARIPiprazole (ABILIFY) 5 MG tablet Take 5 mg by mouth every evening.    12/09/2016 at Unknown  time  . atorvastatin (LIPITOR) 80 MG tablet Take 80 mg by mouth daily at 6 PM.    12/09/2016 at Unknown time  . cloNIDine (CATAPRES) 0.2 MG tablet Take 0.2 mg by mouth 2 (two) times daily.   12/10/2016 at 0500  . COMBIVENT RESPIMAT 20-100 MCG/ACT AERS respimat Inhale 1-2 puffs into the lungs every 6 (six) hours as needed for wheezing or shortness of breath. For shortness of breath.   Past Month at Unknown time  . dabigatran (PRADAXA) 150 MG CAPS Take 150 mg by mouth every other day. Stopped prior to procedure 11-16-16   Past Month at Unknown time  . diclofenac sodium (VOLTAREN) 1 % GEL Apply 2 g topically 4 (four) times daily as needed. For pain.   Past Month at Unknown time  . diltiazem (CARDIZEM CD) 240 MG 24 hr capsule Take 240 mg by mouth daily.   12/09/2016 at Unknown time  . escitalopram (LEXAPRO) 10 MG tablet Take 10 mg by mouth daily.    12/10/2016 at 0500  . folic acid (FOLVITE) 1 MG tablet Take 1 mg by mouth every evening.    12/09/2016 at Unknown time  . furosemide (LASIX) 40 MG tablet Take 40 mg by mouth daily.   12/09/2016 at Unknown time  . hydroxychloroquine (PLAQUENIL) 200 MG tablet Take 200 mg by mouth 2 (two) times daily.    12/09/2016 at Unknown time  . MYRBETRIQ 50 MG TB24 tablet Take 50 mg by mouth daily.   12/10/2016 at 0500  . potassium chloride SA (K-DUR,KLOR-CON) 20 MEQ tablet Take 20 mEq by mouth daily.   12/09/2016 at Unknown time  . pramipexole (MIRAPEX) 0.5 MG tablet Take 0.5 mg by mouth at bedtime.   12/09/2016 at Unknown time  . pregabalin (LYRICA) 50 MG capsule Take 50-100 mg by mouth 2 (two) times daily. Takes 1 capsule (50 MG)  in am and 2 capsules (100 MG) at night   12/10/2016 at 0500  . RABEprazole (ACIPHEX) 20 MG tablet Take 20 mg by mouth daily.   12/10/2016 at 0500  . sitaGLIPtin (JANUVIA) 50 MG tablet Take 50 mg by mouth daily.   12/09/2016 at Unknown time  . Vitamin D, Ergocalciferol, (DRISDOL) 50000 units CAPS capsule Take 50,000 Units by mouth every 30 (thirty) days.   Past Month at Unknown time  . acetaminophen (TYLENOL) 500 MG tablet Take 1,000 mg by mouth every 6 (six) hours as needed (for pain).   More than a month at Unknown time  . glimepiride (AMARYL) 1 MG tablet Take 1 mg by mouth daily as needed (if AM CBG >200).    More than a month at Unknown time  . hydroxypropyl methylcellulose / hypromellose (ISOPTO TEARS / GONIOVISC) 2.5 % ophthalmic solution Place 1-2 drops into both eyes 3 (three) times daily as needed for dry eyes.   More than a month at Unknown time  . ranitidine (ZANTAC) 300 MG tablet Take 300 mg by mouth daily at 2 PM.   More than a month at Unknown time   Allergies  Allergen Reactions  . Adhesive [Tape] Other (See Comments)    Tears skin  . Latex Other (See Comments)    Blisters  . Lotrel [Amlodipine Besy-Benazepril Hcl] Itching and Cough    Social History  Substance Use Topics  .  Smoking status: Former Smoker    Quit date: 11/05/1984  . Smokeless tobacco: Never Used     Comment: stopped smoking 35 years ago  . Alcohol use No  History reviewed. No pertinent family history.   Review of Systems  Constitutional: Negative.   HENT: Negative.   Eyes: Negative.   Respiratory: Negative.   Gastrointestinal: Negative.   Genitourinary: Negative.   Musculoskeletal: Positive for joint pain.  Skin: Negative.   Neurological: Negative.   Endo/Heme/Allergies: Negative.   Psychiatric/Behavioral: Negative.     Objective:  Physical Exam  Constitutional: She is oriented to person, place, and time. No distress.  HENT:  Head: Normocephalic and atraumatic.  Eyes: EOM are normal. Pupils are equal, round, and reactive to light.  Neck: Normal range of motion.  Respiratory: Effort normal. No respiratory distress.  GI: She exhibits no distension.  Musculoskeletal: She exhibits tenderness.  Neurological: She is alert and oriented to person, place, and time.  Skin: Skin is warm and dry.  Psychiatric: She has a normal mood and affect.    Vital signs in last 24 hours: Temp:  [97.8 F (36.6 C)] 97.8 F (36.6 C) (02/05 0500) Pulse Rate:  [57] 57 (02/05 0500) Resp:  [20] 20 (02/05 0500) BP: (153)/(57) 153/57 (02/05 0500)  Labs:   Estimated body mass index is 31.6 kg/m as calculated from the following:   Height as of 08/27/16: 5' (1.524 m).   Weight as of this encounter: 161 lb 13.1 oz (73.4 kg).   Imaging Review Plain radiographs demonstrate moderate degenerative joint disease of the right knee(s). The overall alignment ismild varus. The bone quality appears to be good for age and reported activity level.  Assessment/Plan:  End stage arthritis, right knee   The patient history, physical examination, clinical judgment of the provider and imaging studies are consistent with end stage degenerative joint disease of the right knee(s) and total knee arthroplasty is deemed  medically necessary. The treatment options including medical management, injection therapy arthroscopy and arthroplasty were discussed at length. The risks and benefits of total knee arthroplasty were presented and reviewed. The risks due to aseptic loosening, infection, stiffness, patella tracking problems, thromboembolic complications and other imponderables were discussed. The patient acknowledged the explanation, agreed to proceed with the plan and consent was signed. Patient is being admitted for inpatient treatment for surgery, pain control, PT, OT, prophylactic antibiotics, VTE prophylaxis, progressive ambulation and ADL's and discharge planning. The patient is planning to be discharged home with home health services

## 2016-12-10 NOTE — Progress Notes (Signed)
Orthopedic Tech Progress Note Patient Details:  Gloria Best 09-May-1941 [id number]  Ortho Devices Type of Ortho Device: Knee Immobilizer Ortho Device/Splint Location: Provided bone foam zero degree for Right Knee/leg. (Right knee/leg).  Placed at bedside. Nurse Andi Hence at bedside.   Gloria Best 12/10/2016, 12:09 PM

## 2016-12-10 NOTE — Progress Notes (Signed)
Pt unable to void post operatively. Pt attempted to use BSC x2 and was unsuccessful. I&O cath performed at 6:30 pm per protocol with 550 cc urine returned. Pt does have a history of self catheterizing 10 years ago, pt denies any issues recently. Will encourage PO fluids and pt getting OOB for voiding.

## 2016-12-10 NOTE — Evaluation (Signed)
Physical Therapy Evaluation Patient Details Name: Gloria Best MRN: [id number] DOB: 07/30/1941 Today's Date: 12/10/2016   History of Present Illness  76 y.o. female s/p Rt TKA. PMH: HTN, fibromyalgia, chronic back pain, asthma, anxiety, diabetes, THA, back surgery X3.   Clinical Impression  Pt is s/p TKA resulting in the deficits listed below (see PT Problem List). Pt mobilizing well during initial PT session, with complaints of feeling mildly unsteady. Pt reports that she will have family at home to assist when she leaves the hospital. Anticipate d/c to home following acute stay. PT to increase their independence and safety with mobility.     Follow Up Recommendations Home health PT;Supervision for mobility/OOB    Equipment Recommendations  Rolling walker with 5" wheels;3in1 (PT)    Recommendations for Other Services       Precautions / Restrictions Precautions Precautions: Knee;Fall Precaution Booklet Issued: Yes (comment) Precaution Comments: HEP provided and reviewed knee extension precautions Required Braces or Orthoses: Knee Immobilizer - Right Restrictions Weight Bearing Restrictions: Yes RLE Weight Bearing: Weight bearing as tolerated      Mobility  Bed Mobility Overal bed mobility: Needs Assistance Bed Mobility: Supine to Sit     Supine to sit: Min guard     General bed mobility comments: HOB elevated, using rails.   Transfers Overall transfer level: Needs assistance Equipment used: Rolling walker (2 wheeled) Transfers: Sit to/from Stand Sit to Stand: Min assist         General transfer comment: good hand placement, avoiding weightbearing initially through Rt LE  Ambulation/Gait Ambulation/Gait assistance: Min guard Ambulation Distance (Feet): 3 Feet Assistive device: Rolling walker (2 wheeled) Gait Pattern/deviations: Step-to pattern;Decreased stance time - right;Decreased weight shift to right Gait velocity: slow sequence   General Gait  Details: encouraging weightbearing as tolerated.   Stairs            Wheelchair Mobility    Modified Rankin (Stroke Patients Only)       Balance Overall balance assessment: Needs assistance Sitting-balance support: No upper extremity supported Sitting balance-Leahy Scale: Good     Standing balance support: Bilateral upper extremity supported Standing balance-Leahy Scale: Poor Standing balance comment: using rw for support                             Pertinent Vitals/Pain Pain Assessment: 0-10 Pain Score: 8  Pain Location: Rt knee Pain Descriptors / Indicators: Aching Pain Intervention(s): Limited activity within patient's tolerance;Monitored during session    Home Living Family/patient expects to be discharged to:: Private residence Living Arrangements: Children (daughter) Available Help at Discharge: Family;Available 24 hours/day Type of Home: House Home Access: Ramped entrance     Home Layout: One level Home Equipment: Walker - 4 wheels Additional Comments: lives with daughter and 18 y.o. grandson. Daughter works but her son is always around.     Prior Function Level of Independence: Independent with assistive device(s)         Comments: using rollator for ambulation, bathes herself but daughter helps out.      Hand Dominance        Extremity/Trunk Assessment   Upper Extremity Assessment Upper Extremity Assessment: Generalized weakness    Lower Extremity Assessment Lower Extremity Assessment: RLE deficits/detail RLE Deficits / Details: able to perform SLR    Cervical / Trunk Assessment Cervical / Trunk Assessment: Kyphotic  Communication   Communication: No difficulties  Cognition Arousal/Alertness: Awake/alert Behavior During Therapy: [facility] for  tasks assessed/performed Overall Cognitive Status: Within Functional Limits for tasks assessed                      General Comments      Exercises     Assessment/Plan     PT Assessment Patient needs continued PT services  PT Problem List Decreased strength;Decreased range of motion;Decreased activity tolerance;Decreased balance;Decreased mobility          PT Treatment Interventions DME instruction;Gait training;Functional mobility training;Therapeutic activities;Therapeutic exercise;Stair training;Balance training;Patient/family education    PT Goals (Current goals can be found in the Care Plan section)  Acute Rehab PT Goals Patient Stated Goal: go home from the hospital PT Goal Formulation: With patient Time For Goal Achievement: 12/24/16 Potential to Achieve Goals: Good    Frequency 7X/week   Barriers to discharge        Co-evaluation               End of Session Equipment Utilized During Treatment: Gait belt;Right knee immobilizer Activity Tolerance: Patient tolerated treatment well Patient left: in chair;with call bell/phone within reach;with chair alarm set Nurse Communication: Mobility status;Weight bearing status         Time: 7121-9758 PT Time Calculation (min) (ACUTE ONLY): 32 min   Charges:   PT Evaluation $PT Eval Moderate Complexity: 1 Procedure PT Treatments $Therapeutic Activity: 8-22 mins   PT G Codes:        Cassell Clement, PT, CSCS Pager [phone] Office [phone]  12/10/2016, 4:15 PM

## 2016-12-10 NOTE — Transfer of Care (Signed)
Immediate Anesthesia Transfer of Care Note  Patient: Gloria Best  Procedure(s) Performed: Procedure(s): RIGHT TOTAL KNEE ARTHROPLASTY (Right)  Patient Location: PACU  Anesthesia Type:General and GA combined with regional for post-op pain  Level of Consciousness: awake and patient cooperative  Airway & Oxygen Therapy: Patient Spontanous Breathing and Patient connected to face mask oxygen  Post-op Assessment: Report given to RN and Post -op Vital signs reviewed and stable  Post vital signs: Reviewed and stable  Last Vitals:  Vitals:   12/10/16 0500  BP: (!) 153/57  Pulse: (!) 57  Resp: 20  Temp: 36.6 C    Last Pain:  Vitals:   12/10/16 0618  PainSc: 0-No pain         Complications: No apparent anesthesia complications

## 2016-12-10 NOTE — Brief Op Note (Signed)
12/10/2016  10:01 AM  PATIENT:  Gloria Best  76 y.o. female  PRE-OPERATIVE DIAGNOSIS:  endstage degenerative joint disease right knee  POST-OPERATIVE DIAGNOSIS:  endstage degenerative joint disease right knee  PROCEDURE:  Procedure(s): RIGHT TOTAL KNEE ARTHROPLASTY (Right)  SURGEON:  Surgeon(s) and Role:    * [name], MD - Primary  PHYSICIAN ASSISTANT:Darothy Courtright Ricard Dillon PA-C/ April Green CRNA    ANESTHESIA:   local, regional and general  EBL:  Total I/O In: -  Out: 50 [Blood:50]  BLOOD ADMINISTERED:none  DRAINS: none   LOCAL MEDICATIONS USED:  MARCAINE 0.5% 1:1 EXPAREL 1.3%   Amount: 20 ml  SPECIMEN:  No Specimen  DISPOSITION OF SPECIMEN:  N/A  COUNTS:  YES  TOURNIQUET:   Total Tourniquet Time Documented: Thigh (Right) - 69 minutes Total: Thigh (Right) - 69 minutes   DICTATION: .Viviann Spare Dictation  PLAN OF CARE: Admit to inpatient   PATIENT DISPOSITION:  PACU - hemodynamically stable.   Delay start of Pharmacological VTE agent (>24hrs) due to surgical blood loss or risk of bleeding: no

## 2016-12-10 NOTE — Anesthesia Procedure Notes (Signed)
Central Venous Catheter Insertion Performed by: Roderic Palau, anesthesiologist Start/End2/03/2017 7:21 AM, 12/10/2016 7:31 AM Patient location: Pre-op. Preanesthetic checklist: patient identified, IV checked, site marked, risks and benefits discussed, surgical consent, monitors and equipment checked, pre-op evaluation, timeout performed and anesthesia consent Position: Trendelenburg Lidocaine 1% used for infiltration and patient sedated Hand hygiene performed , maximum sterile barriers used  and Seldinger technique used Catheter size: 8 Fr Total catheter length 16. Central line was placed.Double lumen Procedure performed using ultrasound guided technique. Ultrasound Notes:anatomy identified, needle tip was noted to be adjacent to the nerve/plexus identified, no ultrasound evidence of intravascular and/or intraneural injection and image(s) printed for medical record Attempts: 1 Following insertion, dressing applied, line sutured and Biopatch. Post procedure assessment: blood return through all ports  Patient tolerated the procedure well with no immediate complications.

## 2016-12-10 NOTE — Anesthesia Postprocedure Evaluation (Signed)
Anesthesia Post Note  Patient: Gloria Best  Procedure(s) Performed: Procedure(s) (LRB): RIGHT TOTAL KNEE ARTHROPLASTY (Right)  Patient location during evaluation: PACU Anesthesia Type: General and Regional Level of consciousness: awake and alert Pain management: pain level controlled Vital Signs Assessment: post-procedure vital signs reviewed and stable Respiratory status: spontaneous breathing, nonlabored ventilation, respiratory function stable and patient connected to nasal cannula oxygen Cardiovascular status: blood pressure returned to baseline and stable Postop Assessment: no signs of nausea or vomiting Anesthetic complications: no       Last Vitals:  Vitals:   12/10/16 1130 12/10/16 1141  BP:    Pulse: 63   Resp: 20   Temp:  36.3 C    Last Pain:  Vitals:   12/10/16 1114  PainSc: 0-No pain                 Euleta Belson,W. EDMOND

## 2016-12-10 NOTE — Interval H&P Note (Signed)
History and Physical Interval Note:  12/10/2016 7:16 AM  Gloria Best  has presented today for surgery, with the diagnosis of endstage degenerative joint disease right knee  The various methods of treatment have been discussed with the patient and family. After consideration of risks, benefits and other options for treatment, the patient has consented to  Procedure(s): RIGHT TOTAL KNEE ARTHROPLASTY (Right) as a surgical intervention .  The patient's history has been reviewed, patient examined, no change in status, stable for surgery.  I have reviewed the patient's chart and labs.  Questions were answered to the patient's satisfaction.     [name]

## 2016-12-10 NOTE — Progress Notes (Signed)
Orthopedic Tech Progress Note Patient Details:  Gloria Best February 01, 1941 [id number]  CPM Right Knee Additional Comments: drop off   [name] 12/10/2016, 1:10 PM

## 2016-12-10 NOTE — Op Note (Signed)
12/10/2016  10:21 AM  PATIENT:  Gloria Best  76 y.o. female  MRN: [id number]  OPERATIVE REPORT   PRE-OPERATIVE DIAGNOSIS:  endstage degenerative joint disease right knee  POST-OPERATIVE DIAGNOSIS:  endstage degenerative joint disease right knee  PROCEDURE:  Procedure(s): RIGHT TOTAL KNEE ARTHROPLASTY    SURGEON: [name], MD      ASSISTANT:  Benjiman Core, PA-C  (Present throughout the entire procedure and necessary for completion of procedure in a timely manner)      ANESTHESIA:  General with supplemental right obturator block and local infiltration with marcaine 0.5% 1:1 exparel 1.3% total 20cc. Dr. Oren Bracket     COMPLICATIONS:  None.      COMPONENTS:  DePuy Sigma cemented total knee system.  A #2.5 femoral component.  A #2.5 tibial tray with a 1m polyethylene RP tibial spacer , a 35 mm polyethylene patella.    Implant Name Type Inv. Item Serial No. Manufacturer Lot No. LRB No. Used  CEMENT HV SMART SET - [id number] CEMENT HV SMART SET  DEPUY SYNTHES [id number] 2  FEMUR RT 2.5 - [id number] FEMUR RT 2.5  DEPUY SYNTHES [id number] 1  TIBIA MBT CEMENT SIZE 2.5 - [id number] TIBIA MBT CEMENT SIZE 2.5  DEPUY SYNTHES [id number] 1  PATELLA DOME PFC 35MM - [id number] PATELLA DOME PFC 35MM  DEPUY SYNTHES [id number] 1  PLATE ROT INSERT 175FFSIZE 2.5 - [id number] PLATE ROT INSERT 135TSSIZE 2.5   DEPUY SYNTHES [id number] 1    PROCEDURE:The patient was met in the holding area, and the appropriate right knee identified and marked with "X" and my initials. The patient received a preoperative right obturator nerve block by anesthesia.  The patient was then transported to OR and was placed on the operative table in a supine position. The patient was then placed under general anesthesia without difficulty. The patient received appropriate preoperative antibiotic prophylaxis 2 grams ancef. The patien'ts right knee was examined under anesthesia and  shown to have 0-110 range of motion,varus deformity, ligamentously stable, and lateral patella tracking. Tourniquet was applied to the operative thigh. Leg was then prepped using sterile conditions and draped using sterile technique. Time-out procedure was called and correct knee identified.  The leg was elevated and Esmarch exsanguinated with a thigh  tourniquet elevated ot 280 mmHg.  Initially, through a 12-cm longitudinal incision based over the patella initial exposure was made. The underlying subcutaneous tissues were incised along with the skin incision. A median arthrotomy was performed revealing an excessive amount of normal-appearing joint fluid, The articular surfaces were inspected. The patient had grade 5 changes laterally, grade 4 changes medially and grade 3 changes in the patellofemoral joint. Osteophytes were removed from the femoral condyles and the tibial plateau. The medial and lateral meniscal remnants were removed as well as the anterior cruciate ligament.    Attention then turned to the femur where the knee was flexed to 90 degrees and an intramedullary guide placed a drill hole placed just anterior to the intercondylar notch on the distal femur. The distal cutting jig then attached to the intramedulllay nail 5 degrees of valgus for the right knee. A planned 10 mm to be removed off the most prominent condyle, medial in this case. The jig pinned in place and the distal femoral cut performed.  Anterior bow of the femur measured at 0. The distal champfer cutting jig was moved anteriorly 270mto prevent anterior distal femoral cortex  resection too deeply. Based on this then the amount of proximal tibial cut was determined to be in line with off the depressed medial side of the joint 3-4 mm. Using the proximal tibial cutting jig with the leg alignment guide the jig was pinned to the proximal tibia. 3 pins were used 0 degrees of posterior slope.Tibia then subluxed and proximal tibial cut was  then performed. Soft tissue tensioning then examined and found to be well balanced in flexion. Extension was tight, and this accounted for the additional 2 mm resection off the proximal tibia. Due to soft bone, rheumatoid arthritis, the extension block indented the proximal cut surface of the proximal tibial, and this with improved with the additional 2 mm resection. Total of 4 mm off the proximal tibial resected. #2.5 femoral component chosen. The trial placed against the end of the femur and felt to be a good fit. Distal femur cutting jig was then carefully positioned on the distal femur using the end guide and placing 2 pins in the anterior end of the cut distal femur.  2 threaded pins used to pin the jig in place   Rotation guide was then corrected the alignment 2 pins placed over the distal cut surface of the femur for placement of the jig for the chamfer cuts and coronal cuts. This is a carefully placed and held in place with threaded pins. Protecting soft tissues then the anterior cut was made after first verifying with the wing depth of the cut. Then the posterior coronal cuts protect soft tissue structures posteriorly. Posterior chamfer cut and anterior chamfer cut. These were done without difficulty. Cutting guide removed and box cutting jig was then applied to the distal femur carefully aligned and pinned into place. Box cut was then performed from the distal femur intercondylar box. Proximal tibia was then subluxed the posterior cruciate resected. A #2.5 plate and attached to the transverse cut surface of the proximal tibia using 2 pins. The upright for the reamer was then applied and reaming carried out for the keel for the tibial component. Using the osteotome was then inserted and impacted into place the temporary fixation pins removed from the proximal tibial plate. Femoral component was inserted using a trial component. Then a 10 mm insert placed and the knee brought into full extension full  extension to 0 and 1.5 hyperextension was possible full flexion to 135 without difficulty or lift off. The components were accepted and the permanent #2.5 femoral and tibial components chosen and brought onto the field. Patella was observed to be a width anterior to posterior 22 mm and the residual 16 mm was allowed for the patella component. The cutting jig was then adjusted to allow for 16 mm remaining width. Applied and then the coronal cut the posterior aspect of the patella was performed. Lateral facet of patella showed very little resection. The trial 35 mm patella was chosen based on the size of the cut surface of patella. The 35 mm drill plate applied and drill holes placed through the posterior aspect of the patella. Trial reduction with the 35 mm trial and the leg placed through range of motion showed excellent motion full extension and flexion 135 patella tendency to elevate medially was clamped in place over the medial retinaculum patellar showed normal appearance with flexion extension. No shuck noted then the knee was stable to varus and valgus at 0 30 and 60. Expose were then removed and the knee was then irrigated with copious amounts of. Solution.  A 35 mm permanent patella prosthesis also brought to the field. Cement was mixed the knee was subluxed and carefully soft tissues protected note that the lug holes in the distal femur where drill using the trial prosthesis and with the drill guide provided. With irrigation completed and permanent tibial components were then inserted into place using cement and a semi-putty state over the proximal aspect of the tibia cement was also applied to the deep surface of the tibial component as well as over the posterior runners of the femur and the deep surface of the patella component. These were then carefully place excess cement removed about the circumference of the tibial tray the femur was then placed applying cement to the cut it distal aspect of the  femur and posterior chamfer area anterior chamfer and anterior coronal cut surface small amount within the box. Femoral component was then impacted into place excess cement removed amounts of circumference including the posterior chamfer area and the posterior femoral condyle area. The trial tibial peg then placed in the tibial component and a 10 mm rotating platform trial was inserted knee brought into full extension observed on the appeared to be in 2 of valgus with full extension 1 of hyperextension. Cement was then applied to the posterior cut surface of the patella within each of the patella peg opening was then pin holes were placed for the cementing along the lateral aspect of the patella. Patella component was then carefully aligned and inserted over the posterior aspect of the patella and clamped in place excess cement was then resected circumferentially using scalpel in order for her to preserve cement the lateral facet area. Cement was allowed to fully harden tourniquet was released while cement was nearly completely hardened. Following this then irrigation was carried down careful inspection demonstrated some small bleeders present over the lateral posterior aspect of the knee joint cauterized using electrocautery off to the medial aspect and the areas of geniculate arteries. There is no active bleeding present then at that time. Trial 10 mm tibial insert demonstrated stability of the anterior drawer and negative and the knee stable to varus valgus stress and 30 and 60 flexion and full extension. A 10 mm rotating platform insert was then chosen this was applied to the tibia using the permanent implant removing the trial examining tibia and determined there was no evident residual cement remaining. Also examined posterior runners I posterior aspect of the femoral condyles for any residual cement none was present. The tibial insert in place the reduced placed through range of motion and full extension  flexion and 30 knee stable to varus stress at 0 30 and 60 anterior drawer negative. This component was accepted.Tourniquet released at 68 minutes.  A drain was not necessary the synovium reapproximated over the medial aspect of the knee with 0 Vicryl sutures.  The retinaculum of the knee and the quadriceps tendon were then reapproximated with interrupted #1 Vicryl sutures. Peritenon of the reapproximated with interrupted 0 Vicryl sutures deep subcutaneous layers approximated with interrupted 0 and 2-0 Vicryl sutures and the skin closed with a running subcutaneous stitch of 4-0 Vicryl. Dermabond was then applied. 30 cc of marcaine 1/2%1:1 exparel 1.3% used to infiltrate the skin, subcutaneous layers and quad and patella tendons of the right knee. Adaptic 4 x 4's ABDs pads fixed to the skin with sterile labrum an Ace wrap applied from the foot to the right upper thigh and knee immobilizer. All instrument and sponge counts were correct. Then reactivated  extubated and returned to recovery room in satisfactory condition.  Benjiman Core, PA-C perform the duties of assistant surgeon she performed careful retraction of soft tissues assisted in addition the patient had removal on the OR table is present beginning the case the case and performed closure of the incision from the peritenon to the skin and application of dressing.     [name] 12/10/2016, 10:21 AM

## 2016-12-11 LAB — BASIC METABOLIC PANEL
Anion gap: 11 (ref 5–15)
BUN: 26 mg/dL — ABNORMAL HIGH (ref 6–20)
CALCIUM: 8.6 mg/dL — AB (ref 8.9–10.3)
CO2: 22 mmol/L (ref 22–32)
CREATININE: 1.76 mg/dL — AB (ref 0.44–1.00)
Chloride: 107 mmol/L (ref 101–111)
GFR calc Af Amer: 31 mL/min — ABNORMAL LOW (ref >60)
GFR calc non Af Amer: 27 mL/min — ABNORMAL LOW (ref >60)
GLUCOSE: 134 mg/dL — AB (ref 65–99)
Potassium: 4.4 mmol/L (ref 3.5–5.1)
Sodium: 140 mmol/L (ref 135–145)

## 2016-12-11 LAB — CBC
HEMATOCRIT: 31.8 % — AB (ref 36.0–46.0)
Hemoglobin: 9.7 g/dL — ABNORMAL LOW (ref 12.0–15.0)
MCH: 27.2 pg (ref 26.0–34.0)
MCHC: 30.5 g/dL (ref 30.0–36.0)
MCV: 89.1 fL (ref 78.0–100.0)
Platelets: 263 10*3/uL (ref 150–400)
RBC: 3.57 MIL/uL — ABNORMAL LOW (ref 3.87–5.11)
RDW: 14.6 % (ref 11.5–15.5)
WBC: 15.2 10*3/uL — ABNORMAL HIGH (ref 4.0–10.5)

## 2016-12-11 LAB — HEMOGLOBIN A1C
HEMOGLOBIN A1C: 6 % — AB (ref 4.8–5.6)
Hgb A1c MFr Bld: 6 % — ABNORMAL HIGH (ref 4.8–5.6)
MEAN PLASMA GLUCOSE: 126 mg/dL
Mean Plasma Glucose: 126 mg/dL

## 2016-12-11 LAB — GLUCOSE, CAPILLARY
GLUCOSE-CAPILLARY: 129 mg/dL — AB (ref 65–99)
Glucose-Capillary: 74 mg/dL (ref 65–99)
Glucose-Capillary: 136 mg/dL — ABNORMAL HIGH (ref 65–99)
Glucose-Capillary: 158 mg/dL — ABNORMAL HIGH (ref 65–99)
Glucose-Capillary: 141 mg/dL — ABNORMAL HIGH (ref 65–99)
Glucose-Capillary: 139 mg/dL — ABNORMAL HIGH (ref 65–99)

## 2016-12-11 LAB — CORTISOL-AM, BLOOD: Cortisol - AM: 28.5 ug/dL — ABNORMAL HIGH (ref 6.7–22.6)

## 2016-12-11 MED ORDER — NALOXONE HCL 0.4 MG/ML IJ SOLN
INTRAMUSCULAR | Status: AC
  Administered 2016-12-11: 0.4 mg
  Filled 2016-12-11: qty 1

## 2016-12-11 MED ORDER — HYDROXYCHLOROQUINE SULFATE 200 MG PO TABS
200.0000 mg | ORAL_TABLET | Freq: Two times a day (BID) | ORAL | Status: DC
  Administered 2016-12-11 – 2016-12-14 (×7): 200 mg via ORAL
  Filled 2016-12-11 (×8): qty 1

## 2016-12-11 MED ORDER — PREDNISONE 10 MG PO TABS
10.0000 mg | ORAL_TABLET | Freq: Every day | ORAL | Status: DC
  Administered 2016-12-11 – 2016-12-14 (×4): 10 mg via ORAL
  Filled 2016-12-11 (×4): qty 1

## 2016-12-11 NOTE — Progress Notes (Signed)
Patient ID: Gloria Best, female   DOB: [birth year], 76 y.o.   MRN: [id number] Patient is somulent this AM, hard to keep her eyes open, discontinue hydrocodine 7.5 mg, continue with hydrocodone 5mg  q 4-6 prn pain. Urinary retention likely due to anesthesia, and muscle relaxers and narcotics, urology consult requested as nursing and the cude' catheter team unable to obtain return with  Most recent attempts at catheterization unsuccessful. Her BP is high, but she is showing some lethergy. D/c MSO4, d/c robaxin. I spoke with Dr. Sedonia Small her rheumalologist and he indicates that she has not been on a long term steroid regimen. Her remicade is scheduled for tomorrow but should be delayed for at least 3 weeks to promote healing post knee replacement. Also Plaquenil may be continued. He indicated that a low dose of prednisone 10 mg per day around the time of surgery is not unreasonable to give some adrenal coverage. I will restart her plaquenil, and start a daily dose of prednisone. Adjust narcotics.

## 2016-12-11 NOTE — Clinical Social Work Note (Signed)
CSW consulted for New SNF. PT is recommending HHPT; Supervision for mobility/OOB. RNCM is following for discharge planning needs. CSW is signing off as no further needs identified.   Darden Dates, MSW, LCSW  Clinical Social Worker  [phone]

## 2016-12-11 NOTE — Consult Note (Signed)
Urology Consult   Physician requesting consult: Basil Dess  Reason for consult: Post op urinary retention  History of Present Illness: Gloria Best is a 76 y.o. female with PMH significant for OAB s/p PTNS with Dr. Junious Silk, arthritis, RA, CKD, DM, and HTN who is POD 2 s/p right total knee arthroplasty.  She did not have a foley intra op but did require an I/O cath last night for urinary retention.  This morning she was still unable to void and multiple unsuccessful attempts were made for I/O cath placement due to PVR of 450cc.  Pt has not complained of pain or urge to void.   She resting comfortably and is easily arousable, however; she appears narcotized with slow speech, pin point pupils, and slow cognition.  Per the RN tech this is her baseline and is unchanged from the previous day.  She answers questions but does not appear to completely understand.  Her medications are being adjusted by the surgeon.    Past Medical History:  Diagnosis Date  . Anemia    hx of blood transfusion  . Anxiety   . Arthritis    RA  . Asthma    hx of as child  . Bladder disorder    having difficulty voiding, requires in and out catheter 2x day and prn; denied current need for I&O cath 08/27/16  . Bronchitis    hx of  . Carpal tunnel syndrome of left wrist    hx of  . Chronic back pain   . Chronic kidney disease   . Depression    takes medication for   . Diabetes mellitus    oral medication  . Dysrhythmia    takes cardizem, sees Dr. Marco Collie primary  . Fibromyalgia    takes lyrica  . GERD (gastroesophageal reflux disease)   . H/O hiatal hernia    hx of   . Heart murmur   . Hypertension   . Pneumonia    hx of  . Sleep apnea    no CPAP. last sleep study over 10 years ago  . Urinary tract infection    hx of  . Wears dentures    top    Past Surgical History:  Procedure Laterality Date  . ABDOMINAL HYSTERECTOMY    . APPENDECTOMY    . BACK SURGERY      x 3  . CARPAL TUNNEL  RELEASE     left side only  . CATARACT EXTRACTION     bilateral  . CESAREAN SECTION    . COLON SURGERY     "due to large polyp"  . COLONOSCOPY    . FOOT SURGERY    . HERNIA REPAIR    . HIP ARTHROPLASTY  06/09/2012  . TENOLYSIS Left 11/12/2013   Procedure: LEFT TENDON GRAFT RECONSTRUCTION OF LEFT LONG FINGER SAGITTAL FIBERS CORD RELEASE;  Surgeon: Cammie Sickle., MD;  Location: [place];  Service: Orthopedics;  Laterality: Left;  . TOTAL HIP ARTHROPLASTY  06/09/2012   Procedure: TOTAL HIP ARTHROPLASTY;  Surgeon: [name], MD;  Location: [place];  Service: Orthopedics;  Laterality: Right;  Right total hip replacement     Current Hospital Medications:  Home meds:  Current Meds  Medication Sig  . ARIPiprazole (ABILIFY) 5 MG tablet Take 5 mg by mouth every evening.   Marland Kitchen atorvastatin (LIPITOR) 80 MG tablet Take 80 mg by mouth daily at 6 PM.   . cloNIDine (CATAPRES) 0.2 MG tablet Take  0.2 mg by mouth 2 (two) times daily.  . COMBIVENT RESPIMAT 20-100 MCG/ACT AERS respimat Inhale 1-2 puffs into the lungs every 6 (six) hours as needed for wheezing or shortness of breath. For shortness of breath.  . dabigatran (PRADAXA) 150 MG CAPS Take 150 mg by mouth every other day. Stopped prior to procedure 11-16-16  . diclofenac sodium (VOLTAREN) 1 % GEL Apply 2 g topically 4 (four) times daily as needed. For pain.  Marland Kitchen diltiazem (CARDIZEM CD) 240 MG 24 hr capsule Take 240 mg by mouth daily.  Marland Kitchen escitalopram (LEXAPRO) 10 MG tablet Take 10 mg by mouth daily.  . folic acid (FOLVITE) 1 MG tablet Take 1 mg by mouth every evening.   . furosemide (LASIX) 40 MG tablet Take 40 mg by mouth daily.  . hydroxychloroquine (PLAQUENIL) 200 MG tablet Take 200 mg by mouth 2 (two) times daily.   Marland Kitchen MYRBETRIQ 50 MG TB24 tablet Take 50 mg by mouth daily.  . potassium chloride SA (K-DUR,KLOR-CON) 20 MEQ tablet Take 20 mEq by mouth daily.  . pramipexole (MIRAPEX) 0.5 MG tablet Take 0.5 mg by mouth at bedtime.   . pregabalin (LYRICA) 50 MG capsule Take 50-100 mg by mouth 2 (two) times daily. Takes 1 capsule (50 MG)  in am and 2 capsules (100 MG) at night  . RABEprazole (ACIPHEX) 20 MG tablet Take 20 mg by mouth daily.  . sitaGLIPtin (JANUVIA) 50 MG tablet Take 50 mg by mouth daily.  . Vitamin D, Ergocalciferol, (DRISDOL) 50000 units CAPS capsule Take 50,000 Units by mouth every 30 (thirty) days.    Scheduled Meds: . acetaminophen  1,000 mg Oral Q6H  . ARIPiprazole  5 mg Oral QPM  . atorvastatin  80 mg Oral q1800  . cloNIDine  0.2 mg Oral BID  . dabigatran  150 mg Oral QODAY  . diltiazem  240 mg Oral Daily  . docusate sodium  100 mg Oral BID  . escitalopram  10 mg Oral Daily  . famotidine (PEPCID) IV  20 mg Intravenous Q12H  . ferrous sulfate  325 mg Oral BID WC  . folic acid  1 mg Oral QPM  . furosemide  40 mg Oral Daily  . hydroxychloroquine  200 mg Oral BID  . insulin aspart  0-9 Units Subcutaneous TID WC  . mirabegron ER  50 mg Oral Daily  . pantoprazole  40 mg Oral Daily  . potassium chloride SA  20 mEq Oral Daily  . pramipexole  0.5 mg Oral QHS  . predniSONE  10 mg Oral Q breakfast  . pregabalin  50 mg Oral Daily   And  . pregabalin  100 mg Oral QHS  . [START ON 01/03/2017] Vitamin D (Ergocalciferol)  50,000 Units Oral Q30 days   Continuous Infusions: . 0.9 % NaCl with KCl 20 mEq / L 75 mL/hr at 12/11/16 0358   PRN Meds:.acetaminophen **OR** acetaminophen, alum & mag hydroxide-simeth, bisacodyl, HYDROcodone-acetaminophen, hydroxypropyl methylcellulose / hypromellose, ipratropium-albuterol, menthol-cetylpyridinium **OR** phenol, metoCLOPramide **OR** metoCLOPramide (REGLAN) injection, morphine injection, ondansetron **OR** ondansetron (ZOFRAN) IV, polyethylene glycol, sodium phosphate  Allergies:  Allergies  Allergen Reactions  . Adhesive [Tape] Other (See Comments)    Tears skin  . Latex Other (See Comments)    Blisters  . Lotrel [Amlodipine Besy-Benazepril Hcl] Itching and  Cough    History reviewed. No pertinent family history.  Social History:  reports that she quit smoking about 32 years ago. She has never used smokeless tobacco. She reports that she does  not drink alcohol or use drugs.  ROS: A complete review of systems could not be performed as the pt was to medicated to answer clearly.   Physical Exam:  Vital signs in last 24 hours: Temp:  [97.3 F (36.3 C)-98.2 F (36.8 C)] 98.2 F (36.8 C) (02/06 0423) Pulse Rate:  [60-83] 83 (02/06 0423) Resp:  [10-20] 18 (02/06 0423) BP: (120-164)/(41-63) 164/63 (02/06 0423) SpO2:  [91 %-100 %] 93 % (02/06 0423) Constitutional:  Alert; No acute distress Cardiovascular: Regular rate and rhythm Respiratory: Normal respiratory effort GI: Abdomen is soft, nontender, nondistended, no abdominal masses GU: atrophic vagina; urethra not visible; no discharge/drainage noted; no skin irriation Lymphatic: No lymphadenopathy Neurologic: Grossly intact, narcotized Psychiatric: Normal mood and affect  Laboratory Data:   Recent Labs  12/10/16 0613 12/11/16 0732  WBC 8.3 15.2*  HGB 10.9* 9.7*  HCT 33.8* 31.8*  PLT 299 263     Recent Labs  12/10/16 0613 12/11/16 0732  NA 140 140  K 3.3* 4.4  CL 106 107  GLUCOSE 129* 134*  BUN 32* 26*  CALCIUM 10.2 8.6*  CREATININE 1.93* 1.76*     Results for orders placed or performed during the hospital encounter of 12/10/16 (from the past 24 hour(s))  Hemoglobin A1c     Status: Abnormal   Collection Time: 12/10/16 11:56 AM  Result Value Ref Range   Hgb A1c MFr Bld 6.0 (H) 4.8 - 5.6 %   Mean Plasma Glucose 126 mg/dL  Glucose, capillary     Status: Abnormal   Collection Time: 12/10/16 12:49 PM  Result Value Ref Range   Glucose-Capillary 145 (H) 65 - 99 mg/dL  Glucose, capillary     Status: None   Collection Time: 12/10/16  4:11 PM  Result Value Ref Range   Glucose-Capillary 74 65 - 99 mg/dL  Glucose, capillary     Status: Abnormal   Collection Time:  12/10/16  9:28 PM  Result Value Ref Range   Glucose-Capillary 106 (H) 65 - 99 mg/dL  Glucose, capillary     Status: Abnormal   Collection Time: 12/11/16  2:37 AM  Result Value Ref Range   Glucose-Capillary 136 (H) 65 - 99 mg/dL  Glucose, capillary     Status: Abnormal   Collection Time: 12/11/16  6:19 AM  Result Value Ref Range   Glucose-Capillary 158 (H) 65 - 99 mg/dL  CBC     Status: Abnormal   Collection Time: 12/11/16  7:32 AM  Result Value Ref Range   WBC 15.2 (H) 4.0 - 10.5 K/uL   RBC 3.57 (L) 3.87 - 5.11 MIL/uL   Hemoglobin 9.7 (L) 12.0 - 15.0 g/dL   HCT 31.8 (L) 36.0 - 46.0 %   MCV 89.1 78.0 - 100.0 fL   MCH 27.2 26.0 - 34.0 pg   MCHC 30.5 30.0 - 36.0 g/dL   RDW 14.6 11.5 - 15.5 %   Platelets 263 150 - 400 K/uL  Basic metabolic panel     Status: Abnormal   Collection Time: 12/11/16  7:32 AM  Result Value Ref Range   Sodium 140 135 - 145 mmol/L   Potassium 4.4 3.5 - 5.1 mmol/L   Chloride 107 101 - 111 mmol/L   CO2 22 22 - 32 mmol/L   Glucose, Bld 134 (H) 65 - 99 mg/dL   BUN 26 (H) 6 - 20 mg/dL   Creatinine, Ser 1.76 (H) 0.44 - 1.00 mg/dL   Calcium 8.6 (L) 8.9 - 10.3 mg/dL  GFR calc non Af Amer 27 (L) >60 mL/min   GFR calc Af Amer 31 (L) >60 mL/min   Anion gap 11 5 - 15   No results found for this or any previous visit (from the past 240 hour(s)).  Renal Function:  Recent Labs  12/10/16 7619 12/11/16 0732  CREATININE 1.93* 1.76*   Estimated Creatinine Clearance: 24.3 mL/min (by C-G formula based on SCr of 1.76 mg/dL (H)).  Radiologic Imaging: Dg Chest Port 1 View  Result Date: 12/10/2016 CLINICAL DATA:  Status post central venous catheter placement prior to knee surgery. EXAM: PORTABLE CHEST 1 VIEW COMPARISON:  PA and lateral chest x-ray of September 10, 2016 FINDINGS: The lungs are well-expanded. There is no pneumothorax or pleural effusion. The interstitial markings are mildly prominent though stable. The cardiac silhouette is top-normal in size. The  pulmonary vascularity is normal. There is calcification in the wall of the aortic arch. The right internal jugular venous catheter tip projects over the junction of the middle and distal thirds of the SVC. The observed bony thorax is unremarkable. IMPRESSION: There is no immediate postprocedure complication following placement of the right internal jugular venous catheter. The tip of the catheter projects at the junction of the middle and distal SVC. Mild chronic bronchitic changes. Thoracic aortic atherosclerosis. Electronically Signed   By: David  Martinique M.D.   On: 12/10/2016 11:39   Procedure: Area was prepped with betadine and draped in sterile fashion.  30f foley was placed with immediate return of 1200cc of clear/yellow urine.  Balloon was inflated with 10cc sterile saline.  Pt tolerated well.    Impression/Recommendation: Post op urinary retention--due to narcotics and decreased mobility.  Leave foley in place until more mobile with decreased narcotic use.  She will require a void trial prior to d/c home.  If she is unable to void she can go home with foley and be seen in our office as an outpt for repeat trial.    Amado Nash, [place] 12/11/2016, 11:18 AM

## 2016-12-11 NOTE — Progress Notes (Signed)
Orthopedic Tech Progress Note Patient Details:  Gloria Best 1940-11-23 [id number]  CPM Right Knee CPM Right Knee: On Additional Comments: drop off   [name] 12/11/2016, 6:36 PM

## 2016-12-11 NOTE — Progress Notes (Signed)
OT Cancellation Note  Patient Details Name: Gloria Best MRN: [id number] DOB: 08-12-1941   Cancelled Treatment:    Reason Eval/Treat Not Completed: Fatigue/lethargy limiting ability to participate. On entry, pt lethargic and unable to stay awake long enough to discuss PLOF or progress with OT evaluation. Noted that pt had just recently finished working with PT. Discussed case with PT and RN who report significant functional decline from previous day. OT will check back as able for evaluation.  [address] OTR/L [phone] 12/11/2016, 4:08 PM

## 2016-12-11 NOTE — Progress Notes (Signed)
     Subjective: 1 Day Post-Op Procedure(s) (LRB): RIGHT TOTAL KNEE ARTHROPLASTY (Right) + Awake,alert and having difficulty with urinary retention, no foley used during her case, I&O cath performed last night, this AM bladder scan with significant bladder distension to 450cc, attempts at insertion of catheter unsuccessful. Team from 6N, cude catheter team called but unable to obtain return. Patient reports pain as moderate.    Objective:   VITALS:  Temp:  [97.2 F (36.2 C)-98.2 F (36.8 C)] 98.2 F (36.8 C) (02/06 0423) Pulse Rate:  [60-83] 83 (02/06 0423) Resp:  [10-20] 18 (02/06 0423) BP: (120-164)/(41-63) 164/63 (02/06 0423) SpO2:  [89 %-100 %] 93 % (02/06 0423)  Neurologically intact ABD soft Neurovascular intact Sensation intact distally Intact pulses distally Dorsiflexion/Plantar flexion intact Incision: dressing C/D/I   LABS  Recent Labs  12/10/16 0613 12/11/16 0732  HGB 10.9* 9.7*  WBC 8.3 15.2*  PLT 299 263    Recent Labs  12/10/16 0613 12/11/16 0732  NA 140 140  K 3.3* 4.4  CL 106 107  CO2 25 22  BUN 32* 26*  CREATININE 1.93* 1.76*  GLUCOSE 129* 134*    Recent Labs  12/10/16 0613  INR 1.05     Assessment/Plan: 1 Day Post-Op Procedure(s) (LRB): RIGHT TOTAL KNEE ARTHROPLASTY (Right)  Advance diet Up with therapy  Urology consult requested and call in at 9:04 AM  [name] 12/11/2016, 9:00 AM Patient ID: Gloria Best, female   DOB: September 08, 1941, 76 y.o.   MRN: [id number]

## 2016-12-11 NOTE — Progress Notes (Signed)
Physical Therapy Treatment Patient Details Name: Gloria Best MRN: [id number] DOB: 1941/06/25 Today's Date: 12/11/2016    History of Present Illness 76 y.o. female s/p Rt TKA. PMH: HTN, fibromyalgia, chronic back pain, asthma, anxiety, diabetes, THA, back surgery X3.     PT Comments    Pt very lethargic throughout session, frequently falling asleep even during mobility. Pt easily aroused but quickly falls back asleep. In sitting pt unable to maintain sitting balance and unable to safely attempt standing. PT will continue to follow to progress mobility. Modification to f/u recommendations may be needed depending on patient's progression with mobility.   Follow Up Recommendations  Home health PT;Supervision for mobility/OOB     Equipment Recommendations  Rolling walker with 5" wheels;3in1 (PT)    Recommendations for Other Services       Precautions / Restrictions Precautions Precautions: Knee;Fall Restrictions Weight Bearing Restrictions: Yes RLE Weight Bearing: Weight bearing as tolerated    Mobility  Bed Mobility Overal bed mobility: Needs Assistance Bed Mobility: Supine to Sit;Sit to Supine Rolling: Max assist   Supine to sit: Max assist Sit to supine: Total assist   General bed mobility comments: Pt providing minimal assistance with bed mobility, requiring max to total assistance.   Transfers                 General transfer comment: unable to safely attempt due to lethargy and instability in sitting.   Ambulation/Gait                 Stairs            Wheelchair Mobility    Modified Rankin (Stroke Patients Only)       Balance Overall balance assessment: Needs assistance Sitting-balance support: Bilateral upper extremity supported Sitting balance-Leahy Scale: Poor Sitting balance - Comments: requiring mod assist to maintain sitting balance                            Cognition Arousal/Alertness: Lethargic;Suspect due to  medications Behavior During Therapy: Flat affect Overall Cognitive Status: Within Functional Limits for tasks assessed                      Exercises Total Joint Exercises Ankle Circles/Pumps: AAROM;Right;10 reps Quad Sets:  (pt unable to perform) Heel Slides: AAROM;Right;10 reps Hip ABduction/ADduction: Strengthening;Right;10 reps (max assist) Goniometric ROM: approx. 30 degrees Rt knee flexion     General Comments General comments (skin integrity, edema, etc.): SpO2 in high 90's during session. Pt very lethargic and inconsistent/slow to respond to commands      Pertinent Vitals/Pain Pain Assessment: Faces Faces Pain Scale: Hurts whole lot (with movement) Pain Location: Rt knee Pain Descriptors / Indicators: Grimacing;Guarding;Moaning Pain Intervention(s): Limited activity within patient's tolerance;Monitored during session;Ice applied    Home Living                      Prior Function            PT Goals (current goals can now be found in the care plan section) Acute Rehab PT Goals Patient Stated Goal: not expressed PT Goal Formulation: With patient Time For Goal Achievement: 12/24/16 Potential to Achieve Goals: Good Progress towards PT goals: Not progressing toward goals - comment (very lethargic today, decreased mobility. )    Frequency    7X/week      PT Plan Current plan remains appropriate    Co-evaluation  End of Session Equipment Utilized During Treatment: Right knee immobilizer Activity Tolerance: Patient limited by lethargy Patient left: in bed;with call bell/phone within reach;with bed alarm set;with SCD's reapplied     Time: 8563-1497 PT Time Calculation (min) (ACUTE ONLY): 32 min  Charges:  $Therapeutic Exercise: 8-22 mins $Therapeutic Activity: 8-22 mins                    G Codes:      Cassell Clement, PT, CSCS Pager [phone] Office [phone]  12/11/2016, 10:33 AM

## 2016-12-11 NOTE — Progress Notes (Signed)
RN called into room to assist NT with ambulating pt to bedside commode. Pt has been lethargic all evening and when sitting at bedside was unable to keep herself sitting up, was shaky and still lethargic. Pt was placed back in bed. Neuro assessment completed, pt disoriented to time but otherwise answered all questions appropriately. Hand grasps were intact and equal bilaterally. No drifting limbs. Facial expressions intact and equal bilaterally. Pt unable to visually track pen light. Pupils equal, reactive. Rapid response was called for further assessment. Vitals WNL. BS 136. Will notify Dr. Louanne Skye and continue to monitor.

## 2016-12-11 NOTE — Progress Notes (Signed)
Physical Therapy Treatment Patient Details Name: Gloria Best MRN: [id number] DOB: 1940/11/28 Today's Date: 12/11/2016    History of Present Illness 76 y.o. female s/p Rt TKA. PMH: HTN, fibromyalgia, chronic back pain, asthma, anxiety, diabetes, THA, back surgery X3.     PT Comments    Pt continues to be limited by lethargy and confusion. Pt requiring +2 max assist for all bed mobility and unable to safely attempt standing. If the pt is unable to make a significant improvement with mobility tomorrow, may need SNF prior to returning home. Will update recommendations as appropriate.   Follow Up Recommendations  Home health PT;Supervision for mobility/OOB     Equipment Recommendations  Rolling walker with 5" wheels;3in1 (PT)    Recommendations for Other Services       Precautions / Restrictions Precautions Precautions: Knee;Fall Required Braces or Orthoses: Knee Immobilizer - Right Restrictions Weight Bearing Restrictions: Yes RLE Weight Bearing: Weight bearing as tolerated    Mobility  Bed Mobility Overal bed mobility: Needs Assistance Bed Mobility: Supine to Sit;Sit to Supine     Supine to sit: +2 for physical assistance;Max assist Sit to supine: +2 for physical assistance;Max assist   General bed mobility comments: Pt with poor sitting balance at EOB, physical support needed. Pt leaning forward to attempt to stand and reaching for walker on other side of room.   Transfers                 General transfer comment: unable to safely attempt  Ambulation/Gait                 Stairs            Wheelchair Mobility    Modified Rankin (Stroke Patients Only)       Balance Overall balance assessment: Needs assistance Sitting-balance support: Bilateral upper extremity supported Sitting balance-Leahy Scale: Poor Sitting balance - Comments: mod to max assist needed for sitting balance                            Cognition  Arousal/Alertness: Lethargic;Suspect due to medications Behavior During Therapy: Flat affect Overall Cognitive Status: Impaired/Different from baseline                 General Comments: Pt easily confused during conversation, unable to recall morning session and unable to recall why she is in the hospital.     Exercises      General Comments        Pertinent Vitals/Pain Pain Assessment: 0-10 Pain Score: 10-Worst pain ever Pain Location: Rt knee Pain Descriptors / Indicators: Grimacing;Guarding;Moaning Pain Intervention(s): Limited activity within patient's tolerance;Monitored during session    Home Living                      Prior Function            PT Goals (current goals can now be found in the care plan section) Acute Rehab PT Goals Patient Stated Goal: not expressed PT Goal Formulation: With patient Time For Goal Achievement: 12/24/16 Potential to Achieve Goals: Good Progress towards PT goals: Progressing toward goals    Frequency    7X/week      PT Plan Current plan remains appropriate    Co-evaluation             End of Session Equipment Utilized During Treatment: Right knee immobilizer Activity Tolerance: Patient limited by lethargy (and confusion)  Patient left: in bed;with call bell/phone within reach;with bed alarm set;with SCD's reapplied     Time: 1421-1440 PT Time Calculation (min) (ACUTE ONLY): 19 min  Charges:  $Therapeutic Activity: 8-22 mins                    G Codes:      Cassell Clement, PT, CSCS Pager [phone] Office [phone]  12/11/2016, 3:55 PM

## 2016-12-12 ENCOUNTER — Encounter (HOSPITAL_COMMUNITY): Payer: Self-pay | Admitting: Specialist

## 2016-12-12 LAB — BASIC METABOLIC PANEL
ANION GAP: 11 (ref 5–15)
BUN: 21 mg/dL — ABNORMAL HIGH (ref 6–20)
CO2: 25 mmol/L (ref 22–32)
Calcium: 8.2 mg/dL — ABNORMAL LOW (ref 8.9–10.3)
Chloride: 100 mmol/L — ABNORMAL LOW (ref 101–111)
Creatinine, Ser: 1.67 mg/dL — ABNORMAL HIGH (ref 0.44–1.00)
GFR, EST AFRICAN AMERICAN: 33 mL/min — AB (ref >60)
GFR, EST NON AFRICAN AMERICAN: 29 mL/min — AB (ref >60)
Glucose, Bld: 117 mg/dL — ABNORMAL HIGH (ref 65–99)
POTASSIUM: 3.9 mmol/L (ref 3.5–5.1)
SODIUM: 136 mmol/L (ref 135–145)

## 2016-12-12 LAB — CBC
HCT: 29.1 % — ABNORMAL LOW (ref 36.0–46.0)
Hemoglobin: 9 g/dL — ABNORMAL LOW (ref 12.0–15.0)
MCH: 27.3 pg (ref 26.0–34.0)
MCHC: 30.9 g/dL (ref 30.0–36.0)
MCV: 88.2 fL (ref 78.0–100.0)
PLATELETS: 228 10*3/uL (ref 150–400)
RBC: 3.3 MIL/uL — AB (ref 3.87–5.11)
RDW: 14.7 % (ref 11.5–15.5)
WBC: 16.5 10*3/uL — AB (ref 4.0–10.5)

## 2016-12-12 LAB — GLUCOSE, CAPILLARY
GLUCOSE-CAPILLARY: 112 mg/dL — AB (ref 65–99)
GLUCOSE-CAPILLARY: 104 mg/dL — AB (ref 65–99)
GLUCOSE-CAPILLARY: 116 mg/dL — AB (ref 65–99)
Glucose-Capillary: 146 mg/dL — ABNORMAL HIGH (ref 65–99)

## 2016-12-12 NOTE — Progress Notes (Signed)
Physical Therapy Treatment Patient Details Name: Gloria Best MRN: [id number] DOB: 02-Jun-1941 Today's Date: 12/12/2016    History of Present Illness 76 y.o. female s/p Rt TKA. PMH: HTN, fibromyalgia, chronic back pain, asthma, anxiety, diabetes, THA, back surgery X3.     PT Comments    Patient continues to have limited mobility. Unable to ambulate this session but able to stand pivot with min/mod A. Given pt's current mobility level recommending [facility]NF for further skilled PT services to maximize independence and safety with mobility prior to d/c home.    Follow Up Recommendations  SNF;Supervision/Assistance - 24 hour     Equipment Recommendations  Rolling walker with 5" wheels;3in1 (PT)    Recommendations for Other Services       Precautions / Restrictions Precautions Precautions: Knee;Fall Precaution Booklet Issued: Yes (comment) Precaution Comments: HEP provided during PT eval and reviewed knee extension precautions Required Braces or Orthoses: Knee Immobilizer - Right Knee Immobilizer - Right: On at all times Restrictions Weight Bearing Restrictions: Yes RLE Weight Bearing: Weight bearing as tolerated    Mobility  Bed Mobility Overal bed mobility: Needs Assistance Bed Mobility: Supine to Sit     Supine to sit: Min assist (use of rails, HOB flat)     General bed mobility comments: Pt with good balance sitting EOB and able to bring RLE to EOB min A  Transfers Overall transfer level: Needs assistance Equipment used: Rolling walker (2 wheeled) Transfers: Sit to/from [facility] Sit to Stand: Min assist Stand pivot transfers: Mod assist       General transfer comment: cues for hand placement and safe use of AD with multimodal cues for posture and assist to weight shift and manage RW; pt fatigued quickly with transfers; able to take a few steps forward during stand pivot; flexed trunk and decreased ability to WB on R LE  Ambulation/Gait                  Stairs            Wheelchair Mobility    Modified Rankin (Stroke Patients Only)       Balance Overall balance assessment: Needs assistance Sitting-balance support: Bilateral upper extremity supported;Feet supported Sitting balance-Leahy Scale: Fair Sitting balance - Comments: sitting EOB with no back support   Standing balance support: Bilateral upper extremity supported Standing balance-Leahy Scale: Poor Standing balance comment: using rw for support                    Cognition Arousal/Alertness: Awake/alert;Suspect due to medications (still demonstrating slight confusion) Behavior During Therapy: WFL for tasks assessed/performed Overall Cognitive Status: Within Functional Limits for tasks assessed                 General Comments: Pt seemed more alert this session and able to answer PLOF questions, but then had to ask how to use call bell.     Exercises Total Joint Exercises Quad Sets: AROM;Both;10 reps Heel Slides: AAROM;Right;10 reps Goniometric ROM:  (~50 degrees flexion with AAROM)    General Comments        Pertinent Vitals/Pain Pain Assessment: 0-10 Pain Score: 8  Pain Location: Rt knee Pain Descriptors / Indicators: Grimacing;Guarding;Moaning;Spasm;Sharp Pain Intervention(s): Limited activity within patient's tolerance;Monitored during session;Repositioned;Ice applied    Home Living Family/patient expects to be discharged to:: Private residence Living Arrangements: Children (daughter and 67 y/o grandson) Available Help at Discharge: Family;Available 24 hours/day Type of Home: House Home Access: Ramped entrance  Home Layout: One level Home Equipment: [place] - 4 wheels;Bedside commode;Hand held shower head;Shower seat Additional Comments: lives with daughter and 72 y.o. grandson. Daughter works but her son is always around.     Prior Function Level of Independence: Independent with assistive device(s)       Comments: using rollator for ambulation, bathes herself but daughter helps out.    PT Goals (current goals can now be found in the care plan section) Acute Rehab PT Goals Patient Stated Goal: to get home Progress towards PT goals: Progressing toward goals    Frequency    7X/week      PT Plan Discharge plan needs to be updated    Co-evaluation PT/OT/SLP Co-Evaluation/Treatment: Yes Reason for Co-Treatment: For patient/therapist safety PT goals addressed during session: Mobility/safety with mobility OT goals addressed during session: ADL's and self-care     End of Session Equipment Utilized During Treatment: Right knee immobilizer Activity Tolerance: Patient tolerated treatment well Patient left: in chair;with call bell/phone within reach     Time: 0905-0943 PT Time Calculation (min) (ACUTE ONLY): 38 min  Charges:  $Therapeutic Exercise: 8-22 mins $Therapeutic Activity: 23-37 mins                    G Codes:      [name], PTA Pager: [phone]   12/12/2016, 1:21 PM

## 2016-12-12 NOTE — Progress Notes (Signed)
Physical Therapy Treatment Patient Details Name: Gloria Best MRN: [id number] DOB: [birth year] Today's Date: 12/12/2016    History of Present Illness 76 y.o. female s/p Rt TKA. PMH: HTN, fibromyalgia, chronic back pain, asthma, anxiety, diabetes, THA, back surgery X3.     PT Comments    Patient able to ambulate ~23ft with mod A before requiring seated rest break and then ambulated 26ft. Continue to progress as tolerated with anticipated d/c to SNF for further skilled PT services.    Follow Up Recommendations  SNF;Supervision/Assistance - 24 hour     Equipment Recommendations  Rolling walker with 5" wheels;3in1 (PT)    Recommendations for Other Services       Precautions / Restrictions Precautions Precautions: Knee;Fall Precaution Booklet Issued: Yes (comment) Precaution Comments: HEP provided during PT eval and reviewed knee extension precautions Required Braces or Orthoses: Knee Immobilizer - Right Knee Immobilizer - Right: On at all times Restrictions Weight Bearing Restrictions: Yes RLE Weight Bearing: Weight bearing as tolerated    Mobility  Bed Mobility Overal bed mobility: Needs Assistance Bed Mobility: Supine to Sit     Supine to sit: Min assist (use of rails, HOB flat) Sit to supine: Min assist   General bed mobility comments: cues for sequencing; assist to mobilize R LE  Transfers Overall transfer level: Needs assistance Equipment used: Rolling walker (2 wheeled) Transfers: Sit to/from [facility] Sit to Stand: Min assist Stand pivot transfers: Mod assist       General transfer comment: cues for safe hand placement although pt does not follow commands; pt maintained flexed trunk and bilat UE with OOB mobility; cues for safely descending to EOB and recliner as pt has tendency to sit prematurely  Ambulation/Gait Ambulation/Gait assistance: Mod assist Ambulation Distance (Feet):  (76ft then 55ft) Assistive device: Rolling walker (2  wheeled) Gait Pattern/deviations: Step-to pattern;Decreased stance time - right;Decreased step length - left;Decreased weight shift to right;Antalgic;Trunk flexed     General Gait Details: pt with difficulty WB on R LE and with bilat UE; assist for posture, weight shifting, and RW management   Stairs            Wheelchair Mobility    Modified Rankin (Stroke Patients Only)       Balance Overall balance assessment: Needs assistance Sitting-balance support: Bilateral upper extremity supported;Feet supported Sitting balance-Leahy Scale: Fair Sitting balance - Comments: sitting EOB with no back support   Standing balance support: Bilateral upper extremity supported Standing balance-Leahy Scale: Poor Standing balance comment: using rw for support                    Cognition Arousal/Alertness: Awake/alert Behavior During Therapy: WFL for tasks assessed/performed Overall Cognitive Status: Within Functional Limits for tasks assessed                 General Comments: Pt seemed more alert this session and able to answer PLOF questions, but then had to ask how to use call bell.     Exercises Total Joint Exercises Quad Sets: AROM;Both;10 reps Heel Slides: AAROM;Right;10 reps Goniometric ROM:  (~50 degrees flexion with AAROM)    General Comments General comments (skin integrity, edema, etc.): SpO2 >93% on RA throughout session      Pertinent Vitals/Pain Pain Assessment: Faces Pain Score: 8  Faces Pain Scale: Hurts little more Pain Location: Rt knee Pain Descriptors / Indicators: Guarding;Sore;Aching Pain Intervention(s): Limited activity within patient's tolerance;Monitored during session;Repositioned    Home Living  Prior Function            PT Goals (current goals can now be found in the care plan section) Acute Rehab PT Goals Patient Stated Goal: none stated Progress towards PT goals: Progressing toward goals     Frequency    7X/week      PT Plan Current plan remains appropriate    Co-evaluation PT/OT/SLP Co-Evaluation/Treatment: Yes Reason for Co-Treatment: For patient/therapist safety PT goals addressed during session: Mobility/safety with mobility       End of Session Equipment Utilized During Treatment: Right knee immobilizer Activity Tolerance: Patient tolerated treatment well Patient left: in chair;with call bell/phone within reach     Time: 1520-1547 PT Time Calculation (min) (ACUTE ONLY): 27 min  Charges:  $Gait Training: 8-22 mins $Therapeutic Activity: 8-22 mins                    G Codes:      [name], PTA Pager: [phone]   12/12/2016, 4:03 PM

## 2016-12-12 NOTE — Evaluation (Signed)
Occupational Therapy Evaluation Patient Details Name: Gloria Best MRN: [id number] DOB: [birth year] Today's Date: 12/12/2016    History of Present Illness 76 y.o. female s/p Rt TKA. PMH: HTN, fibromyalgia, chronic back pain, asthma, anxiety, diabetes, THA, back surgery X3.    Clinical Impression   PTA Pt min A for ADL and using a rollator for mobility. Pt currently Max A for ADL and min A for mobility with RW. Pt will benefit from skilled OT in the acute setting to maximize independence and safety with ADL and functional transfers. Pt will also require SNF level therapy upon dc to decrease caregiver burden and assist to safe return to PLOF. Pt is agreeable to SNF, and recognizes the need for it. Next session to focus on 3 in 1 transfer and making sure Pt can don/doff KI or direct/instruct a caregiver how to do it.     Follow Up Recommendations  SNF;Supervision/Assistance - 24 hour    Equipment Recommendations  None recommended by OT (Pt has appropriate DME)    Recommendations for Other Services       Precautions / Restrictions Precautions Precautions: Knee;Fall Precaution Booklet Issued: Yes (comment) Precaution Comments: HEP provided during PT eval and reviewed knee extension precautions Required Braces or Orthoses: Knee Immobilizer - Right Knee Immobilizer - Right: On at all times Restrictions Weight Bearing Restrictions: Yes RLE Weight Bearing: Weight bearing as tolerated      Mobility Bed Mobility Overal bed mobility: Needs Assistance Bed Mobility: Supine to Sit     Supine to sit: Min assist (use of rails, HOB flat)     General bed mobility comments: Pt with good balance sitting EOB and able to bring RLE to EOB min A  Transfers Overall transfer level: Needs assistance Equipment used: Rolling walker (2 wheeled) Transfers: Sit to/from Stand Sit to Stand: Min assist         General transfer comment: vc for safe hand placement and vc for BLE positioning to  assist power up    Balance Overall balance assessment: Needs assistance Sitting-balance support: Bilateral upper extremity supported;Feet supported Sitting balance-Leahy Scale: Fair Sitting balance - Comments: sitting EOB with no back support   Standing balance support: Bilateral upper extremity supported Standing balance-Leahy Scale: Poor Standing balance comment: using rw for support                            ADL Overall ADL's : Needs assistance/impaired     Grooming: Wash/dry face;Oral care;Set up;Sitting Grooming Details (indicate cue type and reason): in recliner Upper Body Bathing: Moderate assistance   Lower Body Bathing: Maximal assistance   Upper Body Dressing : Minimal assistance   Lower Body Dressing: Maximal assistance   Toilet Transfer: Moderate assistance;Cueing for sequencing;Cueing for safety;Ambulation;BSC;RW Toilet Transfer Details (indicate cue type and reason): simulated with trandfer to recliner, Pt has folley at this time         Functional mobility during ADLs: Minimal assistance;Rolling walker;Cueing for sequencing       Vision Vision Assessment?: No apparent visual deficits   Perception     Praxis      Pertinent Vitals/Pain Pain Assessment: 0-10 Pain Score: 8  Pain Location: Rt knee Pain Descriptors / Indicators: Grimacing;Guarding;Moaning;Spasm;Sharp Pain Intervention(s): Monitored during session;Repositioned;Ice applied     Hand Dominance     Extremity/Trunk Assessment Upper Extremity Assessment Upper Extremity Assessment: Generalized weakness   Lower Extremity Assessment Lower Extremity Assessment: RLE deficits/detail;Generalized weakness RLE Deficits /  Details: typical deficits in ROM and strength post-op   Cervical / Trunk Assessment Cervical / Trunk Assessment: Kyphotic   Communication Communication Communication: No difficulties   Cognition Arousal/Alertness: Awake/alert;Suspect due to medications (still  demonstrating slight confusion) Behavior During Therapy: WFL for tasks assessed/performed Overall Cognitive Status: Within Functional Limits for tasks assessed                 General Comments: Pt seemed more alert this session and able to answer PLOF questions, but then had to ask how to use call bell.    General Comments       Exercises       Shoulder Instructions      Home Living Family/patient expects to be discharged to:: Private residence Living Arrangements: Children (daughter and 39 y/o grandson) Available Help at Discharge: Family;Available 24 hours/day Type of Home: House Home Access: Ramped entrance     Home Layout: One level     Bathroom Shower/Tub: Occupational psychologist: Standard     Home Equipment: Environmental consultant - 4 wheels;Bedside commode;Hand held shower head;Shower seat   Additional Comments: lives with daughter and 81 y.o. grandson. Daughter works but her son is always around.       Prior Functioning/Environment Level of Independence: Independent with assistive device(s)        Comments: using rollator for ambulation, bathes herself but daughter helps out.         OT Problem List: Decreased strength;Decreased range of motion;Decreased activity tolerance;Impaired balance (sitting and/or standing);Decreased safety awareness;Decreased knowledge of use of DME or AE;Decreased knowledge of precautions;Pain   OT Treatment/Interventions: Self-care/ADL training;Therapeutic exercise;DME and/or AE instruction;Therapeutic activities;Patient/family education;Balance training    OT Goals(Current goals can be found in the care plan section) Acute Rehab OT Goals Patient Stated Goal: to get home OT Goal Formulation: With patient Time For Goal Achievement: 12/26/16 Potential to Achieve Goals: Good ADL Goals Pt Will Perform Lower Body Bathing: with min guard assist;with caregiver independent in assisting;with adaptive equipment;sitting/lateral leans Pt  Will Perform Lower Body Dressing: sit to/from stand;with min assist;with caregiver independent in assisting;with adaptive equipment Pt Will Transfer to Toilet: with min guard assist;stand pivot transfer;bedside commode (caregiver independent in assisting, with RW) Pt Will Perform Toileting - Clothing Manipulation and hygiene: with min guard assist;with caregiver independent in assisting;sit to/from stand Pt Will Perform Tub/Shower Transfer: Shower transfer;with supervision;with caregiver independent in assisting;ambulating;shower seat;rolling walker  OT Frequency: Min 2X/week   Barriers to D/C:            Co-evaluation PT/OT/SLP Co-Evaluation/Treatment: Yes Reason for Co-Treatment: Necessary to address cognition/behavior during functional activity;For patient/therapist safety;To address functional/ADL transfers PT goals addressed during session: Mobility/safety with mobility OT goals addressed during session: ADL's and self-care      End of Session Equipment Utilized During Treatment: Gait belt;Rolling walker;Oxygen;Right knee immobilizer CPM Right Knee CPM Right Knee: Off Nurse Communication: Mobility status;Precautions;Weight bearing status;Other (comment) (changing dc recommendation)  Activity Tolerance: Patient limited by fatigue Patient left: in chair;with call bell/phone within reach   Time: 2751-7001 OT Time Calculation (min): 29 min Charges:  OT General Charges $OT Visit: 1 Procedure OT Evaluation $OT Eval Moderate Complexity: 1 Procedure G-Codes:    Merri Ray Amado Andal 2016-12-17, 10:26 AM  Hulda Humphrey OTR/L [phone]

## 2016-12-12 NOTE — Progress Notes (Addendum)
Subjective: Pain controlled right knee. Patient is status post right total knee replacement by Dr. Louanne Skye 12/10/2016. This morning I was in patient's room she advised me that her nurse call light has not been working since her arrival to the unit and had communicated this to multiple staff members involved with her care. I also did attempt to use the call light for assistance and a dressing change and it was not working.    Objective: Vital signs in last 24 hours: Temp:  [98.3 F (36.8 C)-100.4 F (38 C)] 98.5 F (36.9 C) (02/07 0545) Pulse Rate:  [75-80] 76 (02/07 0545) Resp:  [16] 16 (02/07 0545) BP: (135-148)/(42-46) 145/46 (02/07 0545) SpO2:  [95 %-100 %] 99 % (02/07 0545)  Intake/Output from previous day: 02/06 0701 - 02/07 0700 In: 2220 [P.O.:720; I.V.:1500] Out: 3675 [Urine:3675] Intake/Output this shift: Total I/O In: 240 [P.O.:240] Out: -    Recent Labs  12/10/16 0613 12/11/16 0732 12/12/16 0558  HGB 10.9* 9.7* 9.0*    Recent Labs  12/11/16 0732 12/12/16 0558  WBC 15.2* 16.5*  RBC 3.57* 3.30*  HCT 31.8* 29.1*  PLT 263 228    Recent Labs  12/11/16 0732 12/12/16 0558  NA 140 136  K 4.4 3.9  CL 107 100*  CO2 22 25  BUN 26* 21*  CREATININE 1.76* 1.67*  GLUCOSE 134* 117*  CALCIUM 8.6* 8.2*    Recent Labs  12/10/16 0613  INR 1.05    Exam:  Pleasant elderly female in no acute distress. Right knee wound looks good. No drainage or signs of infection. Bilateral calves nontender.  Assessment/Plan: Patient may need skilled nursing facility placement for rehabilitation for discharge home. We'll see if she makes progress with PT today. Regards to the issue with patient's nurse call light not working I did communicate this directly to the Restaurant manager, fast food and [place].  Advised that this is a great safety concern for the patient.    Benjiman Core 12/12/2016, 9:14 AM

## 2016-12-12 NOTE — Care Management Note (Signed)
Case Management Note  Patient Details  Name: Gloria Best MRN: [id number] Date of Birth: April 09, 1941  Subjective/Objective: 76 yr old female s/p right total knee arthroplasty.                 Action/Plan: Case manager spoke with patient concerning [place] and DME needs. Choice was offered for Home Health. Referral was called to Stevie Kern, [place] Liaison. Patient states she has rolling waler and 3in1. Patient states her daughter and grandson will assist her at discharge.  Expected Discharge Date:   12/23/16               Expected Discharge Plan:  [place]  In-House Referral:  NA  Discharge planning Services  CM Consult  Post Acute Care Choice:  Home Health Choice offered to:  Patient  DME Arranged:  N/A DME Agency:     HH Arranged:  PT [place] Agency:  [place]  Status of Service:  Completed, signed off  If discussed at [place] of Stay Meetings, dates discussed:    Additional Comments:  Ninfa Meeker, RN 12/12/2016, 12:43 PM

## 2016-12-13 DIAGNOSIS — N9989 Other postprocedural complications and disorders of genitourinary system: Secondary | ICD-10-CM

## 2016-12-13 DIAGNOSIS — E1142 Type 2 diabetes mellitus with diabetic polyneuropathy: Secondary | ICD-10-CM | POA: Diagnosis present

## 2016-12-13 DIAGNOSIS — R338 Other retention of urine: Secondary | ICD-10-CM

## 2016-12-13 HISTORY — DX: Other postprocedural complications and disorders of genitourinary system: N99.89

## 2016-12-13 LAB — BASIC METABOLIC PANEL
ANION GAP: 7 (ref 5–15)
BUN: 23 mg/dL — ABNORMAL HIGH (ref 6–20)
CALCIUM: 8.1 mg/dL — AB (ref 8.9–10.3)
CO2: 23 mmol/L (ref 22–32)
Chloride: 103 mmol/L (ref 101–111)
Creatinine, Ser: 1.53 mg/dL — ABNORMAL HIGH (ref 0.44–1.00)
GFR, EST AFRICAN AMERICAN: 37 mL/min — AB (ref >60)
GFR, EST NON AFRICAN AMERICAN: 32 mL/min — AB (ref >60)
GLUCOSE: 157 mg/dL — AB (ref 65–99)
POTASSIUM: 3.8 mmol/L (ref 3.5–5.1)
Sodium: 133 mmol/L — ABNORMAL LOW (ref 135–145)

## 2016-12-13 LAB — CBC WITH DIFFERENTIAL/PLATELET
BASOS ABS: 0 10*3/uL (ref 0.0–0.1)
BASOS PCT: 0 %
Eosinophils Absolute: 0.1 10*3/uL (ref 0.0–0.7)
Eosinophils Relative: 1 %
HEMATOCRIT: 25.4 % — AB (ref 36.0–46.0)
Hemoglobin: 7.9 g/dL — ABNORMAL LOW (ref 12.0–15.0)
LYMPHS PCT: 17 %
Lymphs Abs: 2.3 10*3/uL (ref 0.7–4.0)
MCH: 27.4 pg (ref 26.0–34.0)
MCHC: 31.1 g/dL (ref 30.0–36.0)
MCV: 88.2 fL (ref 78.0–100.0)
MONO ABS: 1.8 10*3/uL — AB (ref 0.1–1.0)
Monocytes Relative: 13 %
NEUTROS ABS: 9.5 10*3/uL — AB (ref 1.7–7.7)
Neutrophils Relative %: 69 %
Platelets: 211 10*3/uL (ref 150–400)
RBC: 2.88 MIL/uL — AB (ref 3.87–5.11)
RDW: 14.8 % (ref 11.5–15.5)
WBC: 13.7 10*3/uL — AB (ref 4.0–10.5)

## 2016-12-13 LAB — GLUCOSE, CAPILLARY
GLUCOSE-CAPILLARY: 105 mg/dL — AB (ref 65–99)
Glucose-Capillary: 152 mg/dL — ABNORMAL HIGH (ref 65–99)
Glucose-Capillary: 141 mg/dL — ABNORMAL HIGH (ref 65–99)

## 2016-12-13 MED ORDER — HYDROCODONE-ACETAMINOPHEN 5-325 MG PO TABS: 1.0000 | ORAL_TABLET | ORAL | 0 refills | Status: DC | PRN

## 2016-12-13 MED ORDER — HYDROXYCHLOROQUINE SULFATE 200 MG PO TABS: 200.0000 mg | ORAL_TABLET | Freq: Two times a day (BID) | ORAL | 0 refills | Status: DC

## 2016-12-13 MED ORDER — DABIGATRAN ETEXILATE MESYLATE 150 MG PO CAPS: 150.0000 mg | ORAL_CAPSULE | ORAL | 0 refills | Status: DC

## 2016-12-13 MED ORDER — PREGABALIN 100 MG PO CAPS: 100.0000 mg | ORAL_CAPSULE | Freq: Every day | ORAL | 0 refills | Status: DC

## 2016-12-13 MED ORDER — PREGABALIN 50 MG PO CAPS: 50.0000 mg | ORAL_CAPSULE | Freq: Every day | ORAL | 0 refills | Status: DC

## 2016-12-13 MED ORDER — FERROUS SULFATE 325 (65 FE) MG PO TABS: 325.0000 mg | ORAL_TABLET | Freq: Two times a day (BID) | ORAL | 3 refills | Status: DC

## 2016-12-13 NOTE — NC FL2 (Signed)
[place] LEVEL OF CARE SCREENING TOOL     IDENTIFICATION  Patient Name: Gloria Best Birthdate: May 28, 1941 Sex: female Admission Date (Current Location): 12/10/2016  [facility] and [place] Number:  Herbalist and Address:  [facility] [place]. [facility], [place] [address], [place], [place] [address]      Provider Number: [id number]  Attending Physician Name and Address:  [name], MD  Relative Name and Phone Number:       Current Level of Care: Hospital Recommended Level of Care: [place] Prior Approval Number:    Date Approved/Denied: 12/13/16 PASRR Number: [id number]  Discharge Plan: SNF    Current Diagnoses: Patient Active Problem List   Diagnosis Date Noted  . Type 2 diabetes mellitus with diabetic polyneuropathy ([place]) 12/13/2016    Class: Chronic  . Postoperative urinary retention 12/13/2016    Class: Acute  . Unilateral primary osteoarthritis, right knee 12/10/2016    Class: Chronic  . Tricompartment osteoarthritis of right knee 12/10/2016  . Postoperative anemia due to acute blood loss 06/12/2012    Class: Acute  . AVN (avascular necrosis of bone) ([place]) 06/09/2012    Class: Chronic  . Osteoarthritis of right hip 06/09/2012    Class: Chronic    Orientation RESPIRATION BLADDER Height & Weight     Self, Time, Situation, Place  Normal Incontinent Weight: 161 lb 13.1 oz (73.4 kg) Height:     BEHAVIORAL SYMPTOMS/MOOD NEUROLOGICAL BOWEL NUTRITION STATUS      Continent  (Please see discharge summary)  AMBULATORY STATUS COMMUNICATION OF NEEDS Skin   Extensive Assist Verbally Surgical wounds (Closed incision Right knee, compression wrap. Closed incision right hip)                       Personal Care Assistance Level of Assistance  Bathing, Feeding, Dressing Bathing Assistance: Maximum assistance Feeding assistance: Limited assistance Dressing Assistance: Maximum assistance     Functional Limitations  Info  Sight, Hearing, Speech Sight Info: Adequate Hearing Info: Adequate Speech Info: Adequate    SPECIAL CARE FACTORS FREQUENCY  PT (By licensed PT), OT (By licensed OT)     PT Frequency: 7x week OT Frequency: 7x week            Contractures Contractures Info: Not present    Additional Factors Info  Code Status, Allergies Code Status Info: full code Allergies Info: Adhesive Tape, Latex, Lotrel Amlodipine Besy-benazepril Hcl           Current Medications (12/13/2016):  This is the current hospital active medication list Current Facility-Administered Medications  Medication Dose Route Frequency Provider Last Rate Last Dose  . 0.9 % NaCl with KCl 20 mEq/ L  infusion   Intravenous Continuous [name], MD 100 mL/hr at 12/12/16 2058    . acetaminophen (TYLENOL) tablet 650 mg  650 mg Oral Q6H PRN [name], MD   650 mg at 12/12/16 0531   Or  . acetaminophen (TYLENOL) suppository 650 mg  650 mg Rectal Q6H PRN [name], MD      . alum & mag hydroxide-simeth (MAALOX/MYLANTA) 200-200-20 MG/5ML suspension 30 mL  30 mL Oral Q4H PRN [name], MD      . ARIPiprazole (ABILIFY) tablet 5 mg  5 mg Oral QPM [name], MD   5 mg at 12/12/16 1912  . atorvastatin (LIPITOR) tablet 80 mg  80 mg Oral q1800 [name], MD  80 mg at 12/12/16 1912  . bisacodyl (DULCOLAX) suppository 10 mg  10 mg Rectal Daily PRN [name], MD      . cloNIDine (CATAPRES) tablet 0.2 mg  0.2 mg Oral BID [name], MD   0.2 mg at 12/12/16 2323  . dabigatran (PRADAXA) capsule 150 mg  150 mg Oral QODAY [name], MD   150 mg at 12/11/16 1000  . diltiazem (CARDIZEM CD) 24 hr capsule 240 mg  240 mg Oral Daily [name], MD   240 mg at 12/12/16 1156  . docusate sodium (COLACE) capsule 100 mg  100 mg Oral BID [name], MD   100 mg at 12/12/16 2323  . escitalopram (LEXAPRO) tablet 10 mg  10 mg Oral Daily [name], MD   10 mg at 12/12/16 1155  . ferrous sulfate tablet 325 mg   325 mg Oral BID WC [name], MD   325 mg at 12/12/16 1912  . folic acid (FOLVITE) tablet 1 mg  1 mg Oral QPM [name], MD   1 mg at 12/12/16 1912  . furosemide (LASIX) tablet 40 mg  40 mg Oral Daily [name], MD   40 mg at 12/12/16 1157  . HYDROcodone-acetaminophen (NORCO/VICODIN) 5-325 MG per tablet 1-2 tablet  1-2 tablet Oral Q4H PRN [name], MD   1 tablet at 12/13/16 [phone]  . hydroxychloroquine (PLAQUENIL) tablet 200 mg  200 mg Oral BID [name], MD   200 mg at 12/12/16 2324  . hydroxypropyl methylcellulose / hypromellose (ISOPTO TEARS / GONIOVISC) 2.5 % ophthalmic solution 1-2 drop  1-2 drop Both Eyes TID PRN [name], MD      . insulin aspart (novoLOG) injection 0-9 Units  0-9 Units Subcutaneous TID WC [name], MD   1 Units at 12/12/16 1912  . ipratropium-albuterol (DUONEB) 0.5-2.5 (3) MG/3ML nebulizer solution 3 mL  3 mL Inhalation Q6H PRN [name], MD      . menthol-cetylpyridinium (CEPACOL) lozenge 3 mg  1 lozenge Oral PRN [name], MD       Or  . phenol (CHLORASEPTIC) mouth spray 1 spray  1 spray Mouth/Throat PRN [name], MD      . metoCLOPramide (REGLAN) tablet 5-10 mg  5-10 mg Oral Q8H PRN [name], MD       Or  . metoCLOPramide (REGLAN) injection 5-10 mg  5-10 mg Intravenous Q8H PRN [name], MD      . mirabegron ER [facility]) tablet 50 mg  50 mg Oral Daily [name], MD   50 mg at 12/12/16 1155  . morphine 2 MG/ML injection 1 mg  1 mg Intravenous Q2H PRN [name], MD      . ondansetron [facility]) tablet 4 mg  4 mg Oral Q6H PRN [name], MD       Or  . ondansetron [facility]) injection 4 mg  4 mg Intravenous Q6H PRN [name], MD      . pantoprazole (PROTONIX) EC tablet 40 mg  40 mg Oral Daily [name], MD   40 mg at 12/12/16 1156  . polyethylene glycol (MIRALAX / GLYCOLAX) packet 17 g  17 g Oral Daily PRN [name], MD      . potassium chloride SA (K-DUR,KLOR-CON) CR tablet 20 mEq  20 mEq Oral Daily  [name], MD   20 mEq  at 12/12/16 1156  . pramipexole (MIRAPEX) tablet 0.5 mg  0.5 mg Oral QHS [name], MD   0.5 mg at 12/12/16 2323  . predniSONE (DELTASONE) tablet 10 mg  10 mg Oral Q breakfast [name], MD   10 mg at 12/12/16 1156  . pregabalin (LYRICA) capsule 50 mg  50 mg Oral Daily Theone Murdoch Hammons, RPH   50 mg at 12/12/16 1156   And  . pregabalin (LYRICA) capsule 100 mg  100 mg Oral QHS Kimberly B Hammons, RPH   100 mg at 12/12/16 2324  . sodium phosphate (FLEET) 7-19 GM/118ML enema 1 enema  1 enema Rectal Once PRN [name], MD      . Derrill Memo ON 01/03/2017] Vitamin D (Ergocalciferol) (DRISDOL) capsule 50,000 Units  50,000 Units Oral Q30 days [name], MD         Discharge Medications: Please see discharge summary for a list of discharge medications.  Relevant Imaging Results:  Relevant Lab Results:   Additional Information SSN: [id number]  Alla German, LCSW

## 2016-12-13 NOTE — Clinical Social Work Placement (Signed)
   CLINICAL SOCIAL WORK PLACEMENT  NOTE  Date:  12/13/2016  Patient Details  Name: Gloria Best MRN: [id number] Date of Birth: [birth year]  Clinical Social Work is seeking post-discharge placement for this patient at the [place] level of care (*CSW will initial, date and re-position this form in  chart as items are completed):      Patient/family provided with [facility] Work Department's list of facilities offering this level of care within the geographic area requested by the patient (or if unable, by the patient's family).      Patient/family informed of their freedom to choose among providers that offer the needed level of care, that participate in Medicare, Medicaid or managed care program needed by the patient, have an available bed and are willing to accept the patient.      Patient/family informed of [facility]'s ownership interest in [facility] and [facility], as well as of the fact that they are under no obligation to receive care at these facilities.  PASRR submitted to EDS on       PASRR number received on 12/13/16     Existing PASRR number confirmed on       FL2 transmitted to all facilities in geographic area requested by pt/family on 12/13/16     FL2 transmitted to all facilities within larger geographic area on       Patient informed that his/her managed care company has contracts with or will negotiate with certain facilities, including the following:        Yes   Patient/family informed of bed offers received.  Patient chooses bed at [facility] recommends and patient chooses bed at      Patient to be transferred to [facility] on 12/13/16.  Patient to be transferred to facility by [facility]     Patient family notified on 12/13/16 of transfer.  Name of family member notified:  Clarene Critchley     PHYSICIAN Please prepare prescriptions     Additional Comment:     _______________________________________________ Alla German, LCSW 12/13/2016, 2:53 PM

## 2016-12-13 NOTE — Care Management Important Message (Signed)
Important Message  Patient Details  Name: Gloria Best MRN: [id number] Date of Birth: [birth year]   Medicare Important Message Given:  Yes    Orbie Pyo 12/13/2016, 11:52 AM

## 2016-12-13 NOTE — Care Management (Signed)
Patient will be going to SNF for Shortterm rehab. Social work is aware.  Ricki Ricketson, RN BSN Case Manager

## 2016-12-13 NOTE — Discharge Summary (Addendum)
Physician Discharge Summary      Patient ID: ATAYA MURDY MRN: [id number] DOB/AGE: 1941/08/31 76 y.o.  Admit date: 12/10/2016 Discharge date:12/13/2016   Admission Diagnoses:  Principal Problem:   Unilateral primary osteoarthritis, right knee Active Problems:   Postoperative urinary retention   Type 2 diabetes mellitus with diabetic polyneuropathy (HCC)   Tricompartment osteoarthritis of right knee   Discharge Diagnoses:  Same  Past Medical History:  Diagnosis Date  . Anemia    hx of blood transfusion  . Anxiety   . Arthritis    RA  . Asthma    hx of as child  . Bladder disorder    having difficulty voiding, requires in and out catheter 2x day and prn; denied current need for I&O cath 08/27/16  . Bronchitis    hx of  . Carpal tunnel syndrome of left wrist    hx of  . Chronic back pain   . Chronic kidney disease   . Depression    takes medication for   . Diabetes mellitus    oral medication  . Dysrhythmia    takes cardizem, sees Dr. Marco Collie primary  . Fibromyalgia    takes lyrica  . GERD (gastroesophageal reflux disease)   . H/O hiatal hernia    hx of   . Heart murmur   . Hypertension   . Pneumonia    hx of  . Sleep apnea    no CPAP. last sleep study over 10 years ago  . Urinary tract infection    hx of  . Wears dentures    top    Surgeries: Procedure(s): RIGHT TOTAL KNEE ARTHROPLASTY on 12/10/2016   Consultants: Treatment Team:  Ardis Hughs, MD urology.  Discharged Condition: Improved  Hospital Course: MAURIANNA BENARD is an 76 y.o. female who was admitted 12/10/2016 with a chief complaint of No chief complaint on file. , and found to have a diagnosis of Unilateral primary osteoarthritis, right knee. She was brought to the operating room on 12/10/2016 and underwent the above named procedures.    She was given perioperative antibiotics:  Anti-infectives    Start     Dose/Rate Route Frequency Ordered Stop   12/13/16 0000   hydroxychloroquine (PLAQUENIL) 200 MG tablet     200 mg Oral 2 times daily 12/13/16 0831     12/11/16 1000  hydroxychloroquine (PLAQUENIL) tablet 200 mg     200 mg Oral 2 times daily 12/11/16 0957     12/10/16 0601  ceFAZolin (ANCEF) IVPB 2g/100 mL premix     2 g 200 mL/hr over 30 Minutes Intravenous On call to O.R. 12/10/16 0601 12/10/16 0805    This patient recovered uneventfully in the PACU and was transferred to [place] floor 5 N. bed. The patient was given adequate postoperative analgesia next morning postoperative day #1 she was noted to be obtunded had difficulty voiding post surgery and required in and out catheterization the evening of her surgery however on the morning of postoperative day #1 she was unable to void and nursing personnel noted a bladder scan of 450 mL of urine. Nursing personnel were unable to insert an in out catheter so that the urology daily Foley catheter nurses from 6 N. were recalled and also attempted to place a Foley catheter and were unsuccessful. Due to the large bladder volume of a urology consult was obtained and the urology PA Amy dancy saw the patient and was able to insert a Foley catheter to straight  drain with a volume of 1200 mL returned. Foley was left to straight drain throughout the remainder of this patient's hospital course. She was maintained in the dressing of the right lower extremity seen by physical therapy on postoperative day #1 but due to obtundation and the sedation she was not capable of being able to make much progress. Much of her analgesic medications and muscle relaxers were discontinued and she was maintained on hydrocodone. L status improved and physical therapy was able to be more successful in ambulation later that afternoon postoperative day #1 and then on postoperative day #2 was showed some further improvement with the ability to stand and ambulate with assistance about 6 feet from her bed and to the bedside recliner. This however was  still quite slow and while hospital course for total knee arthroplasty has been shortened the patient showed obvious dependencies in her capacity to stand and ambulate due to problems of rheumatoid arthritis and urinary retention with an indwelling Foley catheter and sedation. A phone call was made to Dr. Amil Amen, her rheumatologist and discussion regarding inserted into the possible need for perioperative adrenal coverage and also for medications to be used postoperatively for treatment of her rheumatoid arthritis. After discussion with Dr. Amil Amen patient was placed on prednisone 10 mg today for some adrenal coverage. Her Plaquenil was continued as it was felt to be appropriate in the postoperative period without any concerns of delayed wound healing. Patient was scheduled to have a Remicade injection performed on 2/7 as an outpatient with Dr. Amil Amen this however is to be delayed at least 3 weeks from the time of her knee replacement. Postoperative day #2 patient improved understanding and walking tolerance however the physical therapy felt that she showed significant dependencies and very slow in her standing walking tolerance of the skilled nursing rehabilitation post hospitalization was recommended. Postoperative day #3 the patient was awake alert oriented 4 patient's incision is dry and dressings active. She has some mild swelling about the right knee as expected and has been maintained on Pradaxa for anti-DVT prophylaxis as well as for atrial fibrillation history and carotid artery disease. She was weaned from her oxygen which was provided during. Assisted sedation and obtundation during the first day and a half post op. Her hemoglobin decreased to 9.0 on postoperative day #2 and current laboratory tests from postoperative day #3 are pending. Patient's central line will be discontinued within today and discussion with urology will help to determine whether a voiding trial should be performed. I expect the  patient will probably keep her Foley catheter for a period of 10 days postoperatively will then undergo a voiding trial with her knowledge is Dr. Junious Silk of [facility]. A skilled nursing facility placement and then is undertaken for short-term rehabilitation post hospitalization of for right total knee arthroplasty. Her diabetes remained stable throughout her hospitalizations with sugars in the range of 110-150. She can return to using by mouth anti-hyperglycemic medicines at the time of her discharge. These have returned with Hgb of 7.9 down from 9.0, Na 133 Cr 1.53 improved from 1.7 POD#1. Will continue on ferrous gluconate. Her VSS so will discharge to SNF but will need follow up CBC in the next one week.   She was given sequential compression devices, early ambulation, and chemoprophylaxis for DVT prophylaxis.  She benefited maximally from their hospital stay and there were complications of urinary retention, sedation secondary to narcotic medications.    Recent vital signs:  Vitals:   12/12/16 2100  12/13/16 0455  BP: (!) 145/38 (!) 124/45  Pulse: 77 75  Resp: 18 16  Temp: 99 F (37.2 C) 98.1 F (36.7 C)    Recent laboratory studies:  Results for orders placed or performed during the hospital encounter of 12/10/16  Hemoglobin A1c  Result Value Ref Range   Hgb A1c MFr Bld 6.0 (H) 4.8 - 5.6 %   Mean Plasma Glucose 126 mg/dL  PT- INR Day of Surgery  Result Value Ref Range   Prothrombin Time 13.8 11.4 - 15.2 seconds   INR 1.05   PTT Day of Surgery  Result Value Ref Range   aPTT 28 24 - 36 seconds  Basic metabolic panel  Result Value Ref Range   Sodium 140 135 - 145 mmol/L   Potassium 3.3 (L) 3.5 - 5.1 mmol/L   Chloride 106 101 - 111 mmol/L   CO2 25 22 - 32 mmol/L   Glucose, Bld 129 (H) 65 - 99 mg/dL   BUN 32 (H) 6 - 20 mg/dL   Creatinine, Ser 1.93 (H) 0.44 - 1.00 mg/dL   Calcium 10.2 8.9 - 10.3 mg/dL   GFR calc non Af Amer 24 (L) >60 mL/min   GFR calc Af Amer 28 (L)  >60 mL/min   Anion gap 9 5 - 15  CBC  Result Value Ref Range   WBC 8.3 4.0 - 10.5 K/uL   RBC 3.91 3.87 - 5.11 MIL/uL   Hemoglobin 10.9 (L) 12.0 - 15.0 g/dL   HCT 33.8 (L) 36.0 - 46.0 %   MCV 86.4 78.0 - 100.0 fL   MCH 27.9 26.0 - 34.0 pg   MCHC 32.2 30.0 - 36.0 g/dL   RDW 14.7 11.5 - 15.5 %   Platelets 299 150 - 400 K/uL  Glucose, capillary  Result Value Ref Range   Glucose-Capillary 130 (H) 65 - 99 mg/dL  Glucose, capillary  Result Value Ref Range   Glucose-Capillary 122 (H) 65 - 99 mg/dL   Comment 1 Notify RN   Hemoglobin A1c  Result Value Ref Range   Hgb A1c MFr Bld 6.0 (H) 4.8 - 5.6 %   Mean Plasma Glucose 126 mg/dL  Glucose, capillary  Result Value Ref Range   Glucose-Capillary 145 (H) 65 - 99 mg/dL  CBC  Result Value Ref Range   WBC 15.2 (H) 4.0 - 10.5 K/uL   RBC 3.57 (L) 3.87 - 5.11 MIL/uL   Hemoglobin 9.7 (L) 12.0 - 15.0 g/dL   HCT 31.8 (L) 36.0 - 46.0 %   MCV 89.1 78.0 - 100.0 fL   MCH 27.2 26.0 - 34.0 pg   MCHC 30.5 30.0 - 36.0 g/dL   RDW 14.6 11.5 - 15.5 %   Platelets 263 150 - 400 K/uL  Basic metabolic panel  Result Value Ref Range   Sodium 140 135 - 145 mmol/L   Potassium 4.4 3.5 - 5.1 mmol/L   Chloride 107 101 - 111 mmol/L   CO2 22 22 - 32 mmol/L   Glucose, Bld 134 (H) 65 - 99 mg/dL   BUN 26 (H) 6 - 20 mg/dL   Creatinine, Ser 1.76 (H) 0.44 - 1.00 mg/dL   Calcium 8.6 (L) 8.9 - 10.3 mg/dL   GFR calc non Af Amer 27 (L) >60 mL/min   GFR calc Af Amer 31 (L) >60 mL/min   Anion gap 11 5 - 15  Glucose, capillary  Result Value Ref Range   Glucose-Capillary 106 (H) 65 - 99 mg/dL  Glucose,  capillary  Result Value Ref Range   Glucose-Capillary 136 (H) 65 - 99 mg/dL  Glucose, capillary  Result Value Ref Range   Glucose-Capillary 158 (H) 65 - 99 mg/dL  Glucose, capillary  Result Value Ref Range   Glucose-Capillary 74 65 - 99 mg/dL  Cortisol-am, blood  Result Value Ref Range   Cortisol - AM 28.5 (H) 6.7 - 22.6 ug/dL  Glucose, capillary  Result Value  Ref Range   Glucose-Capillary 141 (H) 65 - 99 mg/dL  Glucose, capillary  Result Value Ref Range   Glucose-Capillary 139 (H) 65 - 99 mg/dL  CBC  Result Value Ref Range   WBC 16.5 (H) 4.0 - 10.5 K/uL   RBC 3.30 (L) 3.87 - 5.11 MIL/uL   Hemoglobin 9.0 (L) 12.0 - 15.0 g/dL   HCT 29.1 (L) 36.0 - 46.0 %   MCV 88.2 78.0 - 100.0 fL   MCH 27.3 26.0 - 34.0 pg   MCHC 30.9 30.0 - 36.0 g/dL   RDW 14.7 11.5 - 15.5 %   Platelets 228 150 - 400 K/uL  Basic metabolic panel  Result Value Ref Range   Sodium 136 135 - 145 mmol/L   Potassium 3.9 3.5 - 5.1 mmol/L   Chloride 100 (L) 101 - 111 mmol/L   CO2 25 22 - 32 mmol/L   Glucose, Bld 117 (H) 65 - 99 mg/dL   BUN 21 (H) 6 - 20 mg/dL   Creatinine, Ser 1.67 (H) 0.44 - 1.00 mg/dL   Calcium 8.2 (L) 8.9 - 10.3 mg/dL   GFR calc non Af Amer 29 (L) >60 mL/min   GFR calc Af Amer 33 (L) >60 mL/min   Anion gap 11 5 - 15  Glucose, capillary  Result Value Ref Range   Glucose-Capillary 129 (H) 65 - 99 mg/dL  Glucose, capillary  Result Value Ref Range   Glucose-Capillary 112 (H) 65 - 99 mg/dL  Glucose, capillary  Result Value Ref Range   Glucose-Capillary 104 (H) 65 - 99 mg/dL   Comment 1 Repeat Test    Comment 2 Document in Chart   Glucose, capillary  Result Value Ref Range   Glucose-Capillary 146 (H) 65 - 99 mg/dL  Glucose, capillary  Result Value Ref Range   Glucose-Capillary 116 (H) 65 - 99 mg/dL  Glucose, capillary  Result Value Ref Range   Glucose-Capillary 105 (H) 65 - 99 mg/dL  CBC with Differential/Platelet  Result Value Ref Range   WBC 13.7 (H) 4.0 - 10.5 K/uL   RBC 2.88 (L) 3.87 - 5.11 MIL/uL   Hemoglobin 7.9 (L) 12.0 - 15.0 g/dL   HCT 25.4 (L) 36.0 - 46.0 %   MCV 88.2 78.0 - 100.0 fL   MCH 27.4 26.0 - 34.0 pg   MCHC 31.1 30.0 - 36.0 g/dL   RDW 14.8 11.5 - 15.5 %   Platelets 211 150 - 400 K/uL   Neutrophils Relative % 69 %   Neutro Abs 9.5 (H) 1.7 - 7.7 K/uL   Lymphocytes Relative 17 %   Lymphs Abs 2.3 0.7 - 4.0 K/uL   Monocytes  Relative 13 %   Monocytes Absolute 1.8 (H) 0.1 - 1.0 K/uL   Eosinophils Relative 1 %   Eosinophils Absolute 0.1 0.0 - 0.7 K/uL   Basophils Relative 0 %   Basophils Absolute 0.0 0.0 - 0.1 K/uL  Basic metabolic panel  Result Value Ref Range   Sodium 133 (L) 135 - 145 mmol/L   Potassium 3.8 3.5 - 5.1  mmol/L   Chloride 103 101 - 111 mmol/L   CO2 23 22 - 32 mmol/L   Glucose, Bld 157 (H) 65 - 99 mg/dL   BUN 23 (H) 6 - 20 mg/dL   Creatinine, Ser 1.53 (H) 0.44 - 1.00 mg/dL   Calcium 8.1 (L) 8.9 - 10.3 mg/dL   GFR calc non Af Amer 32 (L) >60 mL/min   GFR calc Af Amer 37 (L) >60 mL/min   Anion gap 7 5 - 15  Type and screen [place]  Result Value Ref Range   ABO/RH(D) O POS    Antibody Screen NEG    Sample Expiration 12/13/2016     Discharge Medications:   Allergies as of 12/13/2016      Reactions   Adhesive [tape] Other (See Comments)   Tears skin   Latex Other (See Comments)   Blisters   Lotrel [amlodipine Besy-benazepril Hcl] Itching, Cough      Medication List    TAKE these medications   acetaminophen 500 MG tablet Commonly known as:  TYLENOL Take 1,000 mg by mouth every 6 (six) hours as needed (for pain).   ARIPiprazole 5 MG tablet Commonly known as:  ABILIFY Take 5 mg by mouth every evening.   atorvastatin 80 MG tablet Commonly known as:  LIPITOR Take 80 mg by mouth daily at 6 PM.   cloNIDine 0.2 MG tablet Commonly known as:  CATAPRES Take 0.2 mg by mouth 2 (two) times daily.   COMBIVENT RESPIMAT 20-100 MCG/ACT Aers respimat Generic drug:  Ipratropium-Albuterol Inhale 1-2 puffs into the lungs every 6 (six) hours as needed for wheezing or shortness of breath. For shortness of breath.   dabigatran 150 MG Caps capsule Commonly known as:  PRADAXA Take 1 capsule (150 mg total) by mouth every other day. What changed:  You were already taking a medication with the same name, and this prescription was added. Make sure you understand how and when to  take each.   dabigatran 150 MG Caps capsule Commonly known as:  PRADAXA Take 150 mg by mouth every other day. Stopped prior to procedure 11-16-16 What changed:  Another medication with the same name was added. Make sure you understand how and when to take each.   diclofenac sodium 1 % Gel Commonly known as:  VOLTAREN Apply 2 g topically 4 (four) times daily as needed. For pain.   diltiazem 240 MG 24 hr capsule Commonly known as:  CARDIZEM CD Take 240 mg by mouth daily.   escitalopram 10 MG tablet Commonly known as:  LEXAPRO Take 10 mg by mouth daily.   ferrous sulfate 325 (65 FE) MG tablet Take 1 tablet (325 mg total) by mouth 2 (two) times daily with a meal.   folic acid 1 MG tablet Commonly known as:  FOLVITE Take 1 mg by mouth every evening.   furosemide 40 MG tablet Commonly known as:  LASIX Take 40 mg by mouth daily.   glimepiride 1 MG tablet Commonly known as:  AMARYL Take 1 mg by mouth daily as needed (if AM CBG >200).   HYDROcodone-acetaminophen 5-325 MG tablet Commonly known as:  NORCO/VICODIN Take 1-2 tablets by mouth every 4 (four) hours as needed for moderate pain or severe pain (pain).   hydroxychloroquine 200 MG tablet Commonly known as:  PLAQUENIL Take 200 mg by mouth 2 (two) times daily. What changed:  Another medication with the same name was added. Make sure you understand how and when to take  each.   hydroxychloroquine 200 MG tablet Commonly known as:  PLAQUENIL Take 1 tablet (200 mg total) by mouth 2 (two) times daily. What changed:  You were already taking a medication with the same name, and this prescription was added. Make sure you understand how and when to take each.   hydroxypropyl methylcellulose / hypromellose 2.5 % ophthalmic solution Commonly known as:  ISOPTO TEARS / GONIOVISC Place 1-2 drops into both eyes 3 (three) times daily as needed for dry eyes.   MYRBETRIQ 50 MG Tb24 tablet Generic drug:  mirabegron ER Take 50 mg by mouth  daily.   potassium chloride SA 20 MEQ tablet Commonly known as:  K-DUR,KLOR-CON Take 20 mEq by mouth daily.   pramipexole 0.5 MG tablet Commonly known as:  MIRAPEX Take 0.5 mg by mouth at bedtime.   pregabalin 50 MG capsule Commonly known as:  LYRICA Take 50-100 mg by mouth 2 (two) times daily. Takes 1 capsule (50 MG)  in am and 2 capsules (100 MG) at night What changed:  Another medication with the same name was added. Make sure you understand how and when to take each.   pregabalin 50 MG capsule Commonly known as:  LYRICA Take 1 capsule (50 mg total) by mouth daily. Take in the AM daily, different dose at night time. What changed:  You were already taking a medication with the same name, and this prescription was added. Make sure you understand how and when to take each.   pregabalin 100 MG capsule Commonly known as:  LYRICA Take 1 capsule (100 mg total) by mouth at bedtime. What changed:  You were already taking a medication with the same name, and this prescription was added. Make sure you understand how and when to take each.   RABEprazole 20 MG tablet Commonly known as:  ACIPHEX Take 20 mg by mouth daily.   ranitidine 300 MG tablet Commonly known as:  ZANTAC Take 300 mg by mouth daily at 2 PM.   sitaGLIPtin 50 MG tablet Commonly known as:  JANUVIA Take 50 mg by mouth daily.   Vitamin D (Ergocalciferol) 50000 units Caps capsule Commonly known as:  DRISDOL Take 50,000 Units by mouth every 30 (thirty) days.            Durable Medical Equipment        Start     Ordered   12/10/16 1152  DME Walker rolling  Once    Question Answer Comment  Patient needs a walker to treat with the following condition Status post total knee replacement, right   Patient needs a walker to treat with the following condition Tricompartment osteoarthritis of right knee   Patient needs a walker to treat with the following condition Rheumatoid arthritis ([place])      12/10/16 1151    12/10/16 1152  DME 3 n 1  Once     12/10/16 1151      Diagnostic Studies: Dg Chest Port 1 View  Result Date: 12/10/2016 CLINICAL DATA:  Status post central venous catheter placement prior to knee surgery. EXAM: PORTABLE CHEST 1 VIEW COMPARISON:  PA and lateral chest x-ray of September 10, 2016 FINDINGS: The lungs are well-expanded. There is no pneumothorax or pleural effusion. The interstitial markings are mildly prominent though stable. The cardiac silhouette is top-normal in size. The pulmonary vascularity is normal. There is calcification in the wall of the aortic arch. The right internal jugular venous catheter tip projects over the junction of the middle and  distal thirds of the SVC. The observed bony thorax is unremarkable. IMPRESSION: There is no immediate postprocedure complication following placement of the right internal jugular venous catheter. The tip of the catheter projects at the junction of the middle and distal SVC. Mild chronic bronchitic changes. Thoracic aortic atherosclerosis. Electronically Signed   By: David  Martinique M.D.   On: 12/10/2016 11:39    Disposition: 01-Home or Self Care  Discharge Instructions    Call MD / Call 911    Complete by:  As directed    If you experience chest pain or shortness of breath, CALL 911 and be transported to the hospital emergency room.  If you develope a fever above 101 F, pus (white drainage) or increased drainage or redness at the wound, or calf pain, call your surgeon's office.   Constipation Prevention    Complete by:  As directed    Drink plenty of fluids.  Prune juice may be helpful.  You may use a stool softener, such as Colace (over the counter) 100 mg twice a day.  Use MiraLax (over the counter) for constipation as needed.   Diet Carb Modified    Complete by:  As directed    Discharge instructions    Complete by:  As directed    Keep knee incision dry for 5 days post op then may wet while bathing. Therapy daily and CPM goal full  extension and greater than 90 degrees flexion. Call if fever or chills or increased drainage. Go to ER if acutely short of breath or call for ambulance. Return for follow up in 2 weeks. May full weight bear on the surgical leg unless told otherwise. Use knee immobilizer until able to straight leg raise off bed with knee stable. In house walking for first 2 weeks. INSTRUCTIONS AFTER JOINT REPLACEMENT   Remove items at home which could result in a fall. This includes throw rugs or furniture in walking pathways ICE to the affected joint every three hours while awake for 30 minutes at a time, for at least the first 3-5 days, and then as needed for pain and swelling.  Continue to use ice for pain and swelling. You may notice swelling that will progress down to the foot and ankle.  This is normal after surgery.  Elevate your leg when you are not up walking on it.   Continue to use the breathing machine you got in the hospital (incentive spirometer) which will help keep your temperature down.  It is common for your temperature to cycle up and down following surgery, especially at night when you are not up moving around and exerting yourself.  The breathing machine keeps your lungs expanded and your temperature down.   DIET:  As you were doing prior to hospitalization, we recommend a well-balanced diet.  DRESSING / WOUND CARE / SHOWERING  You may change your dressing 3-5 days after surgery.  Then change the dressing every day with sterile gauze.  Please use good hand washing techniques before changing the dressing.  Do not use any lotions or creams on the incision until instructed by your surgeon.  ACTIVITY  Increase activity slowly as tolerated, but follow the weight bearing instructions below.   No driving for 6 weeks or until further direction given by your physician.  You cannot drive while taking narcotics.  No lifting or carrying greater than 10 lbs. until further directed by your  surgeon. Avoid periods of inactivity such as sitting longer than an hour when  not asleep. This helps prevent blood clots.  You may return to work once you are authorized by your doctor.     WEIGHT BEARING   Weight bearing as tolerated with assist device (walker, cane, etc) as directed, use it as long as suggested by your surgeon or therapist, typically at least 4-6 weeks.   EXERCISES  Results after joint replacement surgery are often greatly improved when you follow the exercise, range of motion and muscle strengthening exercises prescribed by your doctor. Safety measures are also important to protect the joint from further injury. Any time any of these exercises cause you to have increased pain or swelling, decrease what you are doing until you are comfortable again and then slowly increase them. If you have problems or questions, call your caregiver or physical therapist for advice.   Rehabilitation is important following a joint replacement. After just a few days of immobilization, the muscles of the leg can become weakened and shrink (atrophy).  These exercises are designed to build up the tone and strength of the thigh and leg muscles and to improve motion. Often times heat used for twenty to thirty minutes before working out will loosen up your tissues and help with improving the range of motion but do not use heat for the first two weeks following surgery (sometimes heat can increase post-operative swelling).   These exercises can be done on a training (exercise) mat, on the floor, on a table or on a bed. Use whatever works the best and is most comfortable for you.    Use music or television while you are exercising so that the exercises are a pleasant break in your day. This will make your life better with the exercises acting as a break in your routine that you can look forward to.   Perform all exercises about fifteen times, three times per day or as directed.  You should exercise both the  operative leg and the other leg as well.  Exercises include:   Quad Sets - Tighten up the muscle on the front of the thigh (Quad) and hold for 5-10 seconds.   Straight Leg Raises - With your knee straight (if you were given a brace, keep it on), lift the leg to 60 degrees, hold for 3 seconds, and slowly lower the leg.  Perform this exercise against resistance later as your leg gets stronger.  Leg Slides: Lying on your back, slowly slide your foot toward your buttocks, bending your knee up off the floor (only go as far as is comfortable). Then slowly slide your foot back down until your leg is flat on the floor again.  Angel Wings: Lying on your back spread your legs to the side as far apart as you can without causing discomfort.  Hamstring Strength:  Lying on your back, push your heel against the floor with your leg straight by tightening up the muscles of your buttocks.  Repeat, but this time bend your knee to a comfortable angle, and push your heel against the floor.  You may put a pillow under the heel to make it more comfortable if necessary.   A rehabilitation program following joint replacement surgery can speed recovery and prevent re-injury in the future due to weakened muscles. Contact your doctor or a physical therapist for more information on knee rehabilitation.    CONSTIPATION  Constipation is defined medically as fewer than three stools per week and severe constipation as less than one stool per week.  Even if  you have a regular bowel pattern at home, your normal regimen is likely to be disrupted due to multiple reasons following surgery.  Combination of anesthesia, postoperative narcotics, change in appetite and fluid intake all can affect your bowels.   YOU MUST use at least one of the following options; they are listed in order of increasing strength to get the job done.  They are all available over the counter, and you may need to use some, POSSIBLY even all of these options:     Drink plenty of fluids (prune juice may be helpful) and high fiber foods Colace 100 mg by mouth twice a day  Senokot for constipation as directed and as needed Dulcolax (bisacodyl), take with full glass of water  Miralax (polyethylene glycol) once or twice a day as needed.  If you have tried all these things and are unable to have a bowel movement in the first 3-4 days after surgery call either your surgeon or your primary doctor.    If you experience loose stools or diarrhea, hold the medications until you stool forms back up.  If your symptoms do not get better within 1 week or if they get worse, check with your doctor.  If you experience "the worst abdominal pain ever" or develop nausea or vomiting, please contact the office immediately for further recommendations for treatment.   ITCHING:  If you experience itching with your medications, try taking only a single pain pill, or even half a pain pill at a time.  You can also use Benadryl over the counter for itching or also to help with sleep.   TED HOSE STOCKINGS:  Use stockings on both legs until for at least 2 weeks or as directed by physician office. They may be removed at night for sleeping.  MEDICATIONS:  See your medication summary on the "After Visit Summary" that nursing will review with you.  You may have some home medications which will be placed on hold until you complete the course of blood thinner medication.  It is important for you to complete the blood thinner medication as prescribed.  PRECAUTIONS:  If you experience chest pain or shortness of breath - call 911 immediately for transfer to the hospital emergency department.   If you develop a fever greater that 101 F, purulent drainage from wound, increased redness or drainage from wound, foul odor from the wound/dressing, or calf pain - CONTACT YOUR SURGEON.                                                   FOLLOW-UP APPOINTMENTS:  If you do not already have a post-op  appointment, please call the office for an appointment to be seen by your surgeon.  Guidelines for how soon to be seen are listed in your "After Visit Summary", but are typically between 1-4 weeks after surgery.  OTHER INSTRUCTIONS:   Knee Replacement:  Do not place pillow under knee, focus on keeping the knee straight while resting. CPM instructions: 0-90 degrees, 2 hours in the morning, 2 hours in the afternoon, and 2 hours in the evening. Place foam block, curve side up under heel at all times except when in CPM or when walking.  DO NOT modify, tear, cut, or change the foam block in any way.  MAKE SURE YOU:  Understand these instructions.  Get  help right away if you are not doing well or get worse.    Thank you for letting us be a part of your medical care team.  It is a privilege we respect greatly.  We hope these instructions will help you stay on track for a fast and full recovery!   Information on my medicine - Pradaxa (dabigatran)  This medication education was reviewed with me or my healthcare representative as part of my discharge preparation.    Why was Pradaxa prescribed for you? Pradaxa was prescribed for you to reduce the risk of forming blood clots that cause a stroke if you have a medical condition called atrial fibrillation (a type of irregular heartbeat).    What do you Need to know about PradAXa? Take your Pradaxa TWICE DAILY - one capsule in the morning and one tablet in the evening with or without food.  It would be best to take the doses about the same time each day.  The capsules should not be broken, chewed or opened - they must be swallowed whole.  Do not store Pradaxa in other medication containers - once the bottle is opened the Pradaxa should be used within FOUR months; throw away any capsules that haven't been by that time.  Take Pradaxa exactly as prescribed by your doctor.  DO NOT stop taking Pradaxa without talking to the doctor who prescribed the  medication.  Stopping without other stroke prevention medication to take the place of Pradaxa may increase your risk of developing a clot that causes a stroke.  Refill your prescription before you run out.  After discharge, you should have regular check-up appointments with your healthcare provider that is prescribing your Pradaxa.  In the future your dose may need to be changed if your kidney function or weight changes by a significant amount.  What do you do if you miss a dose? If you miss a dose, take it as soon as you remember on the same day.  If your next dose is less than 6 hours away, skip the missed dose.  Do not take two doses of PRADAXA at the same time.  Important Safety Information A possible side effect of Pradaxa is bleeding. You should call your healthcare provider right away if you experience any of the following: Bleeding from an injury or your nose that does not stop. Unusual colored urine (red or dark brown) or unusual colored stools (red or black). Unusual bruising for unknown reasons. A serious fall or if you hit your head (even if there is no bleeding).  Some medicines may interact with Pradaxa and might increase your risk of bleeding or clotting while on Pradaxa. To help avoid this, consult your healthcare provider or pharmacist prior to using any new prescription or non-prescription medications, including herbals, vitamins, non-steroidal anti-inflammatory drugs (NSAIDs) and supplements.  This website has more information on Pradaxa (dabigatran): [email or web address]://www.pradaxa.com  Contact Dr.Eskridge' office to arrange for a voiding trial in 10 days around 12/21/2016   Driving restrictions    Complete by:  As directed    No driving for 8 weeks   Full weight bearing    Complete by:  As directed    Increase activity slowly as tolerated    Complete by:  As directed    Lifting restrictions    Complete by:  As directed    No lifting for 12 weeks      Follow-up Information     [name], MD Follow up in 2  week(s).   Specialty:  Orthopedic Surgery Why:  For wound re-check Contact information: [place] [address] [phone]        [facility] Follow up.   Why:  Someone from [facility] will contact you to arrange start date and time for therapy. Contact information: [address] [place] [address] [facility] Follow up.   Contact information: [address] [place] [address] [phone]        Festus Aloe, MD Follow up in 9 day(s).   Specialty:  Urology Why:  Call for an appointment to have a voiding trial post hospitalization, with urinary retention post TKR, also pre op UTI treatment. Contact information: [place] [address] [phone]        Festus Aloe, MD .   Specialty:  Urology Contact information: [address] [place] [address] [phone]            Signed: [name] 12/13/2016, 2:01 PM

## 2016-12-13 NOTE — Clinical Social Work Note (Signed)
Clinical Social Work Assessment  Patient Details  Name: Gloria Best MRN: [id number] Date of Birth: 1941/01/13  Date of referral:  12/13/16               Reason for consult:  Facility Placement                Permission sought to share information with:  Family Supports Permission granted to share information::  Yes, Verbal Permission Granted  Name::     [name]  Agency::     Relationship::  Daughter  Contact Information:  [phone]  Housing/Transportation Living arrangements for the past 2 months:  [place] of Information:  Patient Patient Interpreter Needed:  None Criminal Activity/Legal Involvement Pertinent to Current Situation/Hospitalization:  No - Comment as needed Significant Relationships:  Adult Children Lives with:  Adult Children Do you feel safe going back to the place where you live?    Need for family participation in patient care:  Yes (Comment)  Care giving concerns:  Pt's daughter present at bedside during initial assessment.   Social Worker assessment / plan:  CSW spoke with pt at bedside to complete initial assessment. Pt lives in [place] with her daughter and grandson. Pt's daughter reports her son will be there with pt 24/7. Per MD pt will need to go to rehab for a short amount of time. Pt is agreeable to SNF placement at d/c. Pt prefers a facility in [place]. CSW will initiate SNF search and follow up with b/o once available.   Employment status:  Retired Forensic scientist:  Medicare PT Recommendations:  [place] / Referral to community resources:  [place]  Patient/Family's Response to care:  Pt verbalized understanding of CSW role and expressed appreciation for support. Pt denies any concern regarding pt care at this time.   Patient/Family's Understanding of and Emotional Response to Diagnosis, Current Treatment, and Prognosis:  Pt understanding and realistic regarding  physical limitations. Pt understands the need for SNF placement at d/c. Pt agreeable to SNF placement at d/c, at this time. Pt's responses emotionally appropriate during conversation with CSW. Pt denies any concern regarding treatment plan at this time. CSW will continue to provide support and facilitate d/c needs.   Emotional Assessment Appearance:  Appears stated age Attitude/Demeanor/Rapport:   (Patient was appropriate.) Affect (typically observed):  Accepting, Appropriate, Calm Orientation:  Oriented to Self, Oriented to Place, Oriented to  Time, Oriented to Situation Alcohol / Substance use:  Not Applicable Psych involvement (Current and /or in the community):  No (Comment)  Discharge Needs  Concerns to be addressed:  No discharge needs identified Readmission within the last 30 days:  No Current discharge risk:  Dependent with Mobility Barriers to Discharge:  Continued Medical Work up   [employer], LCSW 12/13/2016, 1:35 PM

## 2016-12-13 NOTE — Clinical Social Work Note (Addendum)
Clinical Social Worker facilitated patient discharge including contacting patient family and facility to confirm patient discharge plans.  Clinical information faxed to facility and family agreeable with plan.  CSW arranged ambulance transport via [facility] (2:00) to [place] . RN to call [phone] for report prior to discharge. Patient will be going to room 107. Clinical Social Worker will sign off for now as social work intervention is no longer needed. Please consult [place] again if new need arises.  [address] [place]

## 2016-12-13 NOTE — Progress Notes (Signed)
Physical Therapy Treatment Patient Details Name: Gloria Best MRN: [id number] DOB: 01-06-1941 Today's Date: 12/13/2016    History of Present Illness 76 y.o. female s/p Rt TKA. PMH: HTN, fibromyalgia, chronic back pain, asthma, anxiety, diabetes, THA, back surgery X3.     PT Comments    Patient continues to require assist for safe ambulation and transfers. Pt has difficulty with R knee extension and therapist stressed importance of trying to tolerate zero degree foam and not resting with knee bent. Pt verbalize understanding and daughter present. Current plan remains appropriate.   Follow Up Recommendations  SNF;Supervision/Assistance - 24 hour     Equipment Recommendations  Rolling walker with 5" wheels;3in1 (PT)    Recommendations for Other Services       Precautions / Restrictions Precautions Precautions: Knee;Fall Precaution Booklet Issued: Yes (comment) Precaution Comments: HEP provided during PT eval and reviewed knee extension precautions Required Braces or Orthoses: Knee Immobilizer - Right Knee Immobilizer - Right: On at all times Restrictions Weight Bearing Restrictions: Yes RLE Weight Bearing: Weight bearing as tolerated    Mobility  Bed Mobility Overal bed mobility: Needs Assistance Bed Mobility: Supine to Sit     Supine to sit: Min guard     General bed mobility comments: increased time and effort; use of rails and HOB elevated slightly; min guard for safety  Transfers Overall transfer level: Needs assistance Equipment used: Rolling walker (2 wheeled) Transfers: Sit to/from Stand Sit to Stand: Min assist         General transfer comment: cues for safe hadn placement and foot placement; assist to power up into standing  Ambulation/Gait Ambulation/Gait assistance: Mod assist Ambulation Distance (Feet): 10 Feet Assistive device: Rolling walker (2 wheeled) Gait Pattern/deviations: Step-to pattern;Decreased stance time - right;Decreased step length  - left;Decreased weight shift to right;Antalgic;Trunk flexed     General Gait Details: multimodal cues for R quad engagement during stance phase and posture and assist to keep RW at safe distance; pt fatigued quickly   Stairs            Wheelchair Mobility    Modified Rankin (Stroke Patients Only)       Balance Overall balance assessment: Needs assistance Sitting-balance support: Bilateral upper extremity supported;Feet supported Sitting balance-Leahy Scale: Fair Sitting balance - Comments: sitting EOB with no back support   Standing balance support: Bilateral upper extremity supported Standing balance-Leahy Scale: Poor Standing balance comment: using rw for support                    Cognition Arousal/Alertness: Awake/alert Behavior During Therapy: WFL for tasks assessed/performed Overall Cognitive Status: Within Functional Limits for tasks assessed                      Exercises Total Joint Exercises Quad Sets: AROM;Right;10 reps Heel Slides: AAROM;Right;10 reps Hip ABduction/ADduction: AROM;Right;10 reps Straight Leg Raises: AAROM;Right;5 reps Long Arc Quad: AROM;Right;10 reps Goniometric ROM:  (approx 10-90 (90 degrees in sitting))    General Comments        Pertinent Vitals/Pain Pain Assessment: Faces Faces Pain Scale: Hurts little more Pain Location: Rt knee Pain Descriptors / Indicators: Guarding;Sore;Aching Pain Intervention(s): Limited activity within patient's tolerance;Monitored during session;Premedicated before session;Repositioned    Home Living                      Prior Function            PT Goals (current goals can  now be found in the care plan section) Acute Rehab PT Goals Patient Stated Goal: none stated Progress towards PT goals: Progressing toward goals    Frequency    7X/week      PT Plan Current plan remains appropriate    Co-evaluation             End of Session Equipment Utilized  During Treatment: Right knee immobilizer Activity Tolerance: Patient limited by fatigue Patient left: in chair;with call bell/phone within reach;with family/visitor present;Other (comment) (R LE in zero degree foam)     Time: 1432-1510 PT Time Calculation (min) (ACUTE ONLY): 38 min  Charges:  $Gait Training: 8-22 mins $Therapeutic Exercise: 8-22 mins $Therapeutic Activity: 8-22 mins                    G Codes:      [name], PTA Pager: [phone]   12/13/2016, 4:55 PM

## 2016-12-13 NOTE — Progress Notes (Signed)
Orthopedic Tech Progress Note Patient Details:  Gloria Best 01/29/1941 [id number]  Patient ID: Gloria Best, female   DOB: [birth year], 76 y.o.   MRN: [id number]   [name] 12/13/2016, 1:09 PM Placed pt's rle on cpm @0 -33 degrees; will increase as pt tolerates; RN notified

## 2016-12-13 NOTE — Progress Notes (Signed)
     Subjective: 3 Days Post-Op Procedure(s) (LRB): RIGHT TOTAL KNEE ARTHROPLASTY (Right) Awake, alert and oriented x 4. Discussed SNF placement post hospitalization due to expected slow rehabilitation course, foley catheter placement. Urology consult recommended for a voiding trial but the cause for urology consult was not only retention but inability of nursing to place a foley due to anatomy. Amount of retained urine 1200 cc which suggest bladder overdistention and  Likely a difficult time with removing foley early. She will require narcotics for total knee replacement for several weeks. I will talk to Dr. Junious Silk about her situation as I believe the safest  Chance of having a successful removal of catheter and post void  Low residual is with removal in 2 weeks post insertion to allow bladder contraction to be improved. Patient reports pain as moderate.    Objective:   VITALS:  Temp:  [98.1 F (36.7 C)-99 F (37.2 C)] 98.1 F (36.7 C) (02/08 0455) Pulse Rate:  [71-77] 75 (02/08 0455) Resp:  [16-18] 16 (02/08 0455) BP: (92-145)/(38-49) 124/45 (02/08 0455) SpO2:  [92 %-98 %] 92 % (02/08 0455)  Neurologically intact ABD soft Neurovascular intact Sensation intact distally Intact pulses distally Dorsiflexion/Plantar flexion intact Incision: no drainage and Mild swelling right knee, effusion as expected post TKR with anticoagulation for Afib/anti DVT prophylaxis.   LABS  Recent Labs  12/11/16 0732 12/12/16 0558  HGB 9.7* 9.0*  WBC 15.2* 16.5*  PLT 263 228    Recent Labs  12/11/16 0732 12/12/16 0558  NA 140 136  K 4.4 3.9  CL 107 100*  CO2 22 25  BUN 26* 21*  CREATININE 1.76* 1.67*  GLUCOSE 134* 117*   No results for input(s): LABPT, INR in the last 72 hours.   Assessment/Plan: 3 Days Post-Op Procedure(s) (LRB): RIGHT TOTAL KNEE ARTHROPLASTY (Right) Anemia  Advance diet Up with therapy Discharge to SNF after results of todays lab reviewed and  After  attempted wean from O2, currently on 1/5 l pnc. I will call  [facility] and discuss with her urologist, Dr. Junious Silk.  [name] 12/13/2016, 8:04 AM Patient ID: Gloria Best, female   DOB: December 24, 1940, 76 y.o.   MRN: [id number]

## 2016-12-13 NOTE — Clinical Social Work Note (Addendum)
Clinical Social Worker facilitated patient discharge including contacting patient family and facility to confirm patient discharge plans.  Clinical information faxed to facility and family agreeable with plan. CSW waiting on a script, MD paged three times, as well as text sent to PA. After CSW receives script CSW will arrange ambulance transport via [facility] to [place].    [address], [place]

## 2016-12-13 NOTE — Progress Notes (Signed)
Patient ID: Gloria Best, female   DOB: 1941-05-03, 76 y.o.   MRN: [id number] Patient has weaned from O2 with RA O2 sat 92%, foley with remain in place till follow up with Urology in 8 days. Lab today returns with Hgb 7.9 decreased from 9.0, Creatiinine is improved to 1.53.  Her vital signs are stable and she has suffered from anemia previously noting that it has been treated with Fe in the past.  Presently on ferrous gluconate BID. I will discharge her to be seen in follow up in the next 2 weeks. Should  Have a follow up CBC in the next 1 week.

## 2016-12-14 ENCOUNTER — Non-Acute Institutional Stay (SKILLED_NURSING_FACILITY): Payer: Medicare Other | Admitting: Internal Medicine

## 2016-12-14 ENCOUNTER — Encounter: Payer: Self-pay | Admitting: Internal Medicine

## 2016-12-14 DIAGNOSIS — G4733 Obstructive sleep apnea (adult) (pediatric): Secondary | ICD-10-CM | POA: Diagnosis present

## 2016-12-14 DIAGNOSIS — E876 Hypokalemia: Secondary | ICD-10-CM | POA: Diagnosis present

## 2016-12-14 DIAGNOSIS — M549 Dorsalgia, unspecified: Secondary | ICD-10-CM | POA: Diagnosis not present

## 2016-12-14 DIAGNOSIS — D72820 Lymphocytosis (symptomatic): Secondary | ICD-10-CM | POA: Diagnosis not present

## 2016-12-14 DIAGNOSIS — K219 Gastro-esophageal reflux disease without esophagitis: Secondary | ICD-10-CM | POA: Diagnosis present

## 2016-12-14 DIAGNOSIS — Z6830 Body mass index (BMI) 30.0-30.9, adult: Secondary | ICD-10-CM | POA: Diagnosis not present

## 2016-12-14 DIAGNOSIS — M1711 Unilateral primary osteoarthritis, right knee: Secondary | ICD-10-CM

## 2016-12-14 DIAGNOSIS — R000 Tachycardia, unspecified: Secondary | ICD-10-CM | POA: Diagnosis not present

## 2016-12-14 DIAGNOSIS — F329 Major depressive disorder, single episode, unspecified: Secondary | ICD-10-CM | POA: Diagnosis not present

## 2016-12-14 DIAGNOSIS — R112 Nausea with vomiting, unspecified: Secondary | ICD-10-CM | POA: Diagnosis not present

## 2016-12-14 DIAGNOSIS — Z9189 Other specified personal risk factors, not elsewhere classified: Secondary | ICD-10-CM | POA: Insufficient documentation

## 2016-12-14 DIAGNOSIS — N9989 Other postprocedural complications and disorders of genitourinary system: Secondary | ICD-10-CM | POA: Diagnosis not present

## 2016-12-14 DIAGNOSIS — I2510 Atherosclerotic heart disease of native coronary artery without angina pectoris: Secondary | ICD-10-CM | POA: Diagnosis not present

## 2016-12-14 DIAGNOSIS — E1142 Type 2 diabetes mellitus with diabetic polyneuropathy: Secondary | ICD-10-CM | POA: Diagnosis not present

## 2016-12-14 DIAGNOSIS — D72823 Leukemoid reaction: Secondary | ICD-10-CM | POA: Diagnosis not present

## 2016-12-14 DIAGNOSIS — N179 Acute kidney failure, unspecified: Secondary | ICD-10-CM | POA: Diagnosis present

## 2016-12-14 DIAGNOSIS — J9811 Atelectasis: Secondary | ICD-10-CM | POA: Diagnosis present

## 2016-12-14 DIAGNOSIS — R338 Other retention of urine: Secondary | ICD-10-CM | POA: Diagnosis not present

## 2016-12-14 DIAGNOSIS — Y846 Urinary catheterization as the cause of abnormal reaction of the patient, or of later complication, without mention of misadventure at the time of the procedure: Secondary | ICD-10-CM | POA: Diagnosis present

## 2016-12-14 DIAGNOSIS — I2782 Chronic pulmonary embolism: Secondary | ICD-10-CM | POA: Diagnosis not present

## 2016-12-14 DIAGNOSIS — K5900 Constipation, unspecified: Secondary | ICD-10-CM | POA: Diagnosis not present

## 2016-12-14 DIAGNOSIS — I10 Essential (primary) hypertension: Secondary | ICD-10-CM | POA: Diagnosis not present

## 2016-12-14 DIAGNOSIS — E785 Hyperlipidemia, unspecified: Secondary | ICD-10-CM | POA: Diagnosis present

## 2016-12-14 DIAGNOSIS — Z7901 Long term (current) use of anticoagulants: Secondary | ICD-10-CM | POA: Diagnosis not present

## 2016-12-14 DIAGNOSIS — I471 Supraventricular tachycardia: Secondary | ICD-10-CM | POA: Diagnosis present

## 2016-12-14 DIAGNOSIS — R400 Somnolence: Secondary | ICD-10-CM | POA: Diagnosis not present

## 2016-12-14 DIAGNOSIS — I499 Cardiac arrhythmia, unspecified: Secondary | ICD-10-CM | POA: Diagnosis not present

## 2016-12-14 DIAGNOSIS — F331 Major depressive disorder, recurrent, moderate: Secondary | ICD-10-CM | POA: Diagnosis not present

## 2016-12-14 DIAGNOSIS — K920 Hematemesis: Secondary | ICD-10-CM | POA: Diagnosis not present

## 2016-12-14 DIAGNOSIS — E43 Unspecified severe protein-calorie malnutrition: Secondary | ICD-10-CM | POA: Diagnosis present

## 2016-12-14 DIAGNOSIS — Z96651 Presence of right artificial knee joint: Secondary | ICD-10-CM | POA: Diagnosis present

## 2016-12-14 DIAGNOSIS — A419 Sepsis, unspecified organism: Secondary | ICD-10-CM | POA: Diagnosis not present

## 2016-12-14 DIAGNOSIS — J40 Bronchitis, not specified as acute or chronic: Secondary | ICD-10-CM | POA: Diagnosis not present

## 2016-12-14 DIAGNOSIS — M25569 Pain in unspecified knee: Secondary | ICD-10-CM | POA: Diagnosis not present

## 2016-12-14 DIAGNOSIS — D649 Anemia, unspecified: Secondary | ICD-10-CM | POA: Diagnosis present

## 2016-12-14 DIAGNOSIS — Z471 Aftercare following joint replacement surgery: Secondary | ICD-10-CM | POA: Diagnosis not present

## 2016-12-14 DIAGNOSIS — R109 Unspecified abdominal pain: Secondary | ICD-10-CM | POA: Diagnosis not present

## 2016-12-14 DIAGNOSIS — N170 Acute kidney failure with tubular necrosis: Secondary | ICD-10-CM | POA: Diagnosis not present

## 2016-12-14 DIAGNOSIS — M069 Rheumatoid arthritis, unspecified: Secondary | ICD-10-CM | POA: Diagnosis present

## 2016-12-14 DIAGNOSIS — I480 Paroxysmal atrial fibrillation: Secondary | ICD-10-CM | POA: Diagnosis present

## 2016-12-14 DIAGNOSIS — G8929 Other chronic pain: Secondary | ICD-10-CM | POA: Diagnosis not present

## 2016-12-14 DIAGNOSIS — D72825 Bandemia: Secondary | ICD-10-CM | POA: Diagnosis not present

## 2016-12-14 DIAGNOSIS — N189 Chronic kidney disease, unspecified: Secondary | ICD-10-CM | POA: Diagnosis present

## 2016-12-14 DIAGNOSIS — Z96641 Presence of right artificial hip joint: Secondary | ICD-10-CM | POA: Diagnosis present

## 2016-12-14 DIAGNOSIS — D62 Acute posthemorrhagic anemia: Secondary | ICD-10-CM

## 2016-12-14 DIAGNOSIS — D72829 Elevated white blood cell count, unspecified: Secondary | ICD-10-CM | POA: Diagnosis not present

## 2016-12-14 DIAGNOSIS — R030 Elevated blood-pressure reading, without diagnosis of hypertension: Secondary | ICD-10-CM | POA: Diagnosis not present

## 2016-12-14 DIAGNOSIS — M797 Fibromyalgia: Secondary | ICD-10-CM | POA: Diagnosis present

## 2016-12-14 DIAGNOSIS — N329 Bladder disorder, unspecified: Secondary | ICD-10-CM | POA: Diagnosis not present

## 2016-12-14 DIAGNOSIS — R531 Weakness: Secondary | ICD-10-CM | POA: Diagnosis not present

## 2016-12-14 DIAGNOSIS — R05 Cough: Secondary | ICD-10-CM | POA: Diagnosis not present

## 2016-12-14 DIAGNOSIS — E1122 Type 2 diabetes mellitus with diabetic chronic kidney disease: Secondary | ICD-10-CM | POA: Diagnosis present

## 2016-12-14 DIAGNOSIS — M6281 Muscle weakness (generalized): Secondary | ICD-10-CM | POA: Diagnosis not present

## 2016-12-14 DIAGNOSIS — N390 Urinary tract infection, site not specified: Secondary | ICD-10-CM | POA: Diagnosis present

## 2016-12-14 DIAGNOSIS — I129 Hypertensive chronic kidney disease with stage 1 through stage 4 chronic kidney disease, or unspecified chronic kidney disease: Secondary | ICD-10-CM | POA: Diagnosis present

## 2016-12-14 DIAGNOSIS — N319 Neuromuscular dysfunction of bladder, unspecified: Secondary | ICD-10-CM | POA: Diagnosis present

## 2016-12-14 DIAGNOSIS — N3281 Overactive bladder: Secondary | ICD-10-CM | POA: Diagnosis not present

## 2016-12-14 DIAGNOSIS — T83511A Infection and inflammatory reaction due to indwelling urethral catheter, initial encounter: Secondary | ICD-10-CM | POA: Diagnosis present

## 2016-12-14 HISTORY — DX: Other specified personal risk factors, not elsewhere classified: Z91.89

## 2016-12-14 LAB — HEMOGLOBIN AND HEMATOCRIT, BLOOD
HEMATOCRIT: 26.7 % — AB (ref 36.0–46.0)
HEMOGLOBIN: 8.4 g/dL — AB (ref 12.0–15.0)

## 2016-12-14 LAB — GLUCOSE, CAPILLARY
GLUCOSE-CAPILLARY: 94 mg/dL (ref 65–99)
GLUCOSE-CAPILLARY: 78 mg/dL (ref 65–99)

## 2016-12-14 MED ORDER — HYDROCODONE-ACETAMINOPHEN 5-325 MG PO TABS: 1.0000 | ORAL_TABLET | ORAL | 0 refills | Status: DC | PRN

## 2016-12-14 MED ORDER — FERROUS SULFATE 325 (65 FE) MG PO TABS: 325.0000 mg | ORAL_TABLET | Freq: Two times a day (BID) | ORAL | 3 refills | Status: AC

## 2016-12-14 MED ORDER — DABIGATRAN ETEXILATE MESYLATE 150 MG PO CAPS: 150.0000 mg | ORAL_CAPSULE | ORAL | 0 refills | Status: DC

## 2016-12-14 NOTE — Assessment & Plan Note (Signed)
PT/OT @ SNF 

## 2016-12-14 NOTE — Assessment & Plan Note (Signed)
Continue on iron supplement

## 2016-12-14 NOTE — Progress Notes (Signed)
Physical Therapy Treatment Patient Details Name: Gloria Best MRN: [id number] DOB: 11/01/1941 Today's Date: 12/14/2016    History of Present Illness 76 y.o. female s/p Rt TKA. PMH: HTN, fibromyalgia, chronic back pain, asthma, anxiety, diabetes, THA, back surgery X3.     PT Comments    Patient continues to make gradual progress with gait and ROM. Pt total A for pericare. Current plan remains appropriate.   Follow Up Recommendations  SNF;Supervision/Assistance - 24 hour     Equipment Recommendations  Rolling walker with 5" wheels;3in1 (PT)    Recommendations for Other Services       Precautions / Restrictions Precautions Precautions: Knee;Fall Required Braces or Orthoses: Knee Immobilizer - Right Knee Immobilizer - Right: On at all times Restrictions Weight Bearing Restrictions: Yes RLE Weight Bearing: Weight bearing as tolerated    Mobility  Bed Mobility Overal bed mobility: Needs Assistance Bed Mobility: Supine to Sit     Supine to sit: Min guard     General bed mobility comments: increased time and effort; use of rails and HOB elevated slightly; min guard for safety  Transfers Overall transfer level: Needs assistance Equipment used: Rolling walker (2 wheeled) Transfers: Sit to/from Stand Sit to Stand: Min guard         General transfer comment: carry over of safe hand placement; min guard for safety; from EOB and BSC  Ambulation/Gait Ambulation/Gait assistance: Min assist Ambulation Distance (Feet): 25 Feet Assistive device: Rolling walker (2 wheeled) Gait Pattern/deviations: Decreased stance time - right;Decreased step length - left;Decreased weight shift to right;Trunk flexed;Step-through pattern     General Gait Details: cues for posture and proximity of RW; pt with improved abilit to WB on R LE and step through pattern   Stairs            Wheelchair Mobility    Modified Rankin (Stroke Patients Only)       Balance Overall balance  assessment: Needs assistance Sitting-balance support: Bilateral upper extremity supported;Feet supported Sitting balance-Leahy Scale: Fair Sitting balance - Comments: sitting EOB with no back support   Standing balance support: Bilateral upper extremity supported Standing balance-Leahy Scale: Poor Standing balance comment: using rw for support                    Cognition Arousal/Alertness: Awake/alert Behavior During Therapy: WFL for tasks assessed/performed Overall Cognitive Status: Within Functional Limits for tasks assessed                      Exercises Total Joint Exercises Quad Sets: AROM;Right;10 reps Heel Slides: AAROM;Right;10 reps Hip ABduction/ADduction: AROM;Right;10 reps Goniometric ROM: 5-90    General Comments General comments (skin integrity, edema, etc.): pt total A for pericare      Pertinent Vitals/Pain Pain Assessment: Faces Faces Pain Scale: Hurts little more Pain Location: Rt knee Pain Descriptors / Indicators: Guarding;Sore Pain Intervention(s): Limited activity within patient's tolerance;Premedicated before session;Monitored during session;Repositioned    Home Living                      Prior Function            PT Goals (current goals can now be found in the care plan section) Acute Rehab PT Goals Patient Stated Goal: none stated Progress towards PT goals: Progressing toward goals    Frequency    7X/week      PT Plan Current plan remains appropriate    Co-evaluation  End of Session Equipment Utilized During Treatment: Right knee immobilizer;Gait belt Activity Tolerance: Patient tolerated treatment well Patient left: in chair;with call bell/phone within reach     Time: 6063-0160 PT Time Calculation (min) (ACUTE ONLY): 34 min  Charges:  $Gait Training: 8-22 mins $Therapeutic Exercise: 8-22 mins                     G Codes:      [name], PTA Pager: [phone]   12/14/2016, 11:45 AM

## 2016-12-14 NOTE — Patient Instructions (Signed)
See assessment and plan under each diagnosis in the problem list and acutely for this visit 

## 2016-12-14 NOTE — Assessment & Plan Note (Signed)
Hold Januvia and Glimeride to prevent hypoglycemia as A1c is prediabetic at 6%

## 2016-12-14 NOTE — Progress Notes (Addendum)
Subjective: Doing well.  Pain controlled.  Ready for transfer to SNF.  Objective: Vital signs in last 24 hours: Temp:  [98.7 F (37.1 C)-99 F (37.2 C)] 98.7 F (37.1 C) (02/09 0602) Pulse Rate:  [65-71] 67 (02/09 0602) Resp:  [16] 16 (02/09 0602) BP: (115-147)/(42-48) 147/46 (02/09 0602) SpO2:  [92 %-96 %] 96 % (02/09 0602)  Intake/Output from previous day: 02/08 0701 - 02/09 0700 In: 240 [P.O.:240] Out: 3250 [Urine:3250] Intake/Output this shift: No intake/output data recorded.   Recent Labs  12/12/16 0558 12/13/16 0909  HGB 9.0* 7.9*    Recent Labs  12/12/16 0558 12/13/16 0909  WBC 16.5* 13.7*  RBC 3.30* 2.88*  HCT 29.1* 25.4*  PLT 228 211    Recent Labs  12/12/16 0558 12/13/16 0909  NA 136 133*  K 3.9 3.8  CL 100* 103  CO2 25 23  BUN 21* 23*  CREATININE 1.67* 1.53*  GLUCOSE 117* 157*  CALCIUM 8.2* 8.1*   No results for input(s): LABPT, INR in the last 72 hours.  Exam:  Very pleasant female.  Alert and oriented x 3.  NAD.  Right knee wound looks good.  steris intact.  No drainage or signs of infection. Not much swelling.  Calf nontender.    Assessment/Plan: Will get stat Hgb/hct. Transfer to snf this afternoon.  F/u in office as scheduled.     Benjiman Core 12/14/2016, 8:29 AM     Patient examined and note reviewed.

## 2016-12-14 NOTE — Assessment & Plan Note (Signed)
Continue Foley Outpatient follow-up with Dr. Junious Silk, [facility]

## 2016-12-14 NOTE — Assessment & Plan Note (Signed)
patient informed of opiod risk Also Norco 5/325 mg ordered as 1-2 tablets every 4 hours as needed was decreased to 5/325 every 6 hours as needed to decrease risk. The dose reduction was also based on the potential of her receiving up to 8500 mg of acetaminophen a day based on the acetaminophen dose in the Norco and a separate order of acetaminophen 1000 milligrams every 6 hours as needed

## 2016-12-14 NOTE — Progress Notes (Signed)
This is a comprehensive admission note to [facility] performed on this date less than 30 days from date of admission. Included are preadmission medical/surgical history;reconciled medication list; family history; social history and comprehensive review of systems.  Corrections and additions to the records were documented . Comprehensive physical exam was also performed. Additionally a clinical summary was entered for each active diagnosis pertinent to this admission in the Problem List to enhance continuity of care.  PCP: Dr Nyra Capes  HPI: Patient was hospitalized 2/5-12/13/16 and underwent right total knee arthroplasty on 2/5 for end-stage tricompartmental primary osteoarthritis.Post operatively day 1 she was described as somnulent, "hard to keep eyes open". MSO4 and muscle relaxants were discontinued ,but she was maintained on hydrocodone.Hydrocodone 7.5 was decreased to 5 mg q 4-6 hours prn.  Mental status improved and she was able to participate in PT, but progressed slowly. Her anemia worrsened postop with hemoglobin down to 7.9, decreased from a value of 9.  At the recommendation of her Rheumatologist she was placed on prednisone 10 mg daily for to prevent adrenal insufficiency.  Remicaded injection originally scheduled for 2/7 was to be delayed 3 weeks. Also  she was unable to void with bladder scan documented urinary retention of 450 cc .She was seen in consultation by urology.  Foley catheter was inserted, she will have follow-up by Dr Junious Silk, Urologist as an outpatient. In the hospital glucoses ran 110- 157. She was on glimepiride as well as Januvia . Her A1c was 6% on 12/10/16, nondiabetic. Review of the chart reveals that from 08/2016-12/10/16 A1c's have ranged from low of  5.6 to 6 percent high. She has renal insufficiency, creatinines ranged from 1.53-1.76.  Past medical and surgical history: Includes recurrent urinary tract infections, sleep apnea with CPAP  intolerance, HTN, GERD, fibromyalgia, diabetes, depression, chronic renal disease, chronic back pain, and anemia. Surgeries include total hip arthroplasty 2013, tendon graft reconstruction, and colon polypectomy  Social history: She quit smoking 35 years ago. She does not drink.  Family history: Only positive for diabetes in both parents and multiple sibs. History was incomplete, it was updated.  Review of systems: She describes production of yellow nasal discharge especially in the mornings as well as half a teaspoon of yellow sputum in the mornings. This is been going on for months. She denies any associated constitutional symptoms. She is being treated by her PCP for recurrent urinary tract infections. She describes "split vision". Ophthalmology exam is up-to-date. Fasting blood sugars have been less than 130. They've been low as the 80s without frank hypoglycemia. Patient describes snoring, she was diagnosed with sleep apnea but is intolerant to CPAP. She has numbness in her feet.   She is followed by [facility] for difficulty voiding. Follow-up urology appointment is pending.   Constitutional: No fever,significant weight change, fatigue  Eyes: No redness, discharge, pain, vision change ENT/mouth: No  earache,change in hearing ,sore throat  Cardiovascular: No chest pain, palpitations,paroxysmal nocturnal dyspnea, claudication, edema  Respiratory: No cough, sputum production,hemoptysis, DOE   Gastrointestinal: No heartburn,dysphagia,abdominal pain, nausea / vomiting,rectal bleeding, melena,change in bowels Genitourinary: No dysuria,hematuria, pyuria,  incontinence, nocturia Dermatologic: No rash, pruritus, change in appearance of skin Neurologic: No dizziness,headache,syncope, seizures, numbness , tingling Psychiatric: No significant anxiety , depression, insomnia, anorexia Endocrine: No change in hair/skin/ nails, excessive thirst, excessive hunger, excessive urination    Hematologic/lymphatic: No significant bruising, lymphadenopathy,abnormal bleeding Allergy/immunology: No itchy/ watery eyes, significant sneezing, urticaria, angioedema  Physical exam:  Pertinent or positive  findings: she is alert and oriented. She has bilateral ptosis, greater on the right than the left. She has an upper plate. Grade 1.5 systolic murmur is present. The Foley catheter is in place. Pulses are good.   There is subtle lateral deviation of the hands.   Fusiform changes of the right knee. The operative site is bandaged.   General appearance:Adequately nourished; no acute distress , increased work of breathing is present.   Lymphatic: No lymphadenopathy about the head, neck, axilla . Eyes: No conjunctival inflammation or lid edema is present. There is no scleral icterus. Ears:  External ear exam shows no significant lesions or deformities.   Nose:  External nasal examination shows no deformity or inflammation. Nasal mucosa are pink and moist without lesions ,exudates Oral exam: lips and gums are healthy appearing.There is no oropharyngeal erythema or exudate . Neck:  No thyromegaly, masses, tenderness noted.    Heart:  Normal rate and regular rhythm. S1 and S2 normal without gallop, click, rub .  Lungs:Chest clear to auscultation without wheezes, rhonchi,rales , rubs. Abdomen:Bowel sounds are normal. Abdomen is soft and nontender with no organomegaly, hernias,masses. GU: deferred as previously addressed. Extremities:  No cyanosis, clubbing,edema  Neurologic exam : Balance,Rhomberg,finger to nose testing could not be completed due to clinical state Deep tendon reflexes are equal in UE Skin: Warm & dry w/o tenting. No significant lesions or rash.  See clinical summary under each active problem in the Problem List with associated updated therapeutic plan

## 2016-12-20 ENCOUNTER — Non-Acute Institutional Stay (SKILLED_NURSING_FACILITY): Payer: Medicare Other | Admitting: Nurse Practitioner

## 2016-12-20 ENCOUNTER — Encounter: Payer: Self-pay | Admitting: Nurse Practitioner

## 2016-12-20 DIAGNOSIS — D62 Acute posthemorrhagic anemia: Secondary | ICD-10-CM

## 2016-12-20 DIAGNOSIS — N9989 Other postprocedural complications and disorders of genitourinary system: Secondary | ICD-10-CM

## 2016-12-20 DIAGNOSIS — R338 Other retention of urine: Secondary | ICD-10-CM | POA: Diagnosis not present

## 2016-12-20 DIAGNOSIS — J40 Bronchitis, not specified as acute or chronic: Secondary | ICD-10-CM

## 2016-12-20 DIAGNOSIS — N3281 Overactive bladder: Secondary | ICD-10-CM | POA: Diagnosis not present

## 2016-12-20 NOTE — Progress Notes (Signed)
Nursing Home Location:  [facility] Room:    Place of Service: SNF (31)  PCP: Marco Collie, MD   Code status: Full Code  Allergies  Allergen Reactions  . Adhesive [Tape] Other (See Comments)    Tears skin  . Latex Other (See Comments)    Blisters  . Lotrel [Amlodipine Besy-Benazepril Hcl] Itching and Cough    Chief Complaint  Patient presents with  . Acute Visit    HPI:  Patient is a 76 y.o. female seen today at [facility] for evaluation of discharge home. Pt was hospitalized 2/5-12/13/16 and underwent right total knee arthroplasty on 2/5 for end-stage tricompartmental primary osteoarthritis. Pt has issues with pain medication making her sleepy therefore it was decreased, has not had any other issues.  pts anemia worrsened postop with hemoglobin down to 7.9, decreased from a value of 9, trending up at 8.4 at this time.   Also, she was unable to void after surgery due to oversedation and decreased mobility. Foley reinserted and urology consult placed.   In the hospital glucoses ran 110- 157. She was on glimepiride as well as Januvia . Her A1c was 6% on 12/10/16, nondiabetic and medication was stopped in facility.  Pt reports today she has increased cough and congestion. Coughing thick yellow/green mucous. No shortness of breath. Pt reports low grade temp of 99.0  Review of Systems:  Review of Systems  Constitutional: Negative for activity change, appetite change, fatigue and unexpected weight change.  HENT: Negative for congestion.   Eyes: Negative.   Respiratory: Positive for cough. Negative for shortness of breath and wheezing.   Cardiovascular: Negative for chest pain, palpitations and leg swelling.  Gastrointestinal: Negative for abdominal pain, constipation and diarrhea.  Genitourinary: Negative for difficulty urinating and dysuria.  Musculoskeletal: Positive for arthralgias, joint swelling and myalgias.       Pain to left knee, controlled on  current regimen.   Skin: Negative for color change and wound.  Neurological: Negative for dizziness and weakness.  Psychiatric/Behavioral: Negative for agitation, behavioral problems and confusion.    Past Medical History:  Diagnosis Date  . Anemia    hx of blood transfusion  . Anxiety   . Arthritis    RA  . Asthma    hx of as child  . Bladder disorder    having difficulty voiding, requires in and out catheter 2x day and prn; denied current need for I&O cath 08/27/16  . Bronchitis    hx of  . Carpal tunnel syndrome of left wrist    hx of  . Chronic back pain   . Chronic kidney disease   . Depression    takes medication for   . Diabetes mellitus    oral medication  . Dysrhythmia    takes cardizem, sees Dr. Marco Collie primary  . Fibromyalgia    takes lyrica  . GERD (gastroesophageal reflux disease)   . H/O hiatal hernia    hx of   . Heart murmur   . Hypertension   . Pneumonia    hx of  . Sleep apnea    no CPAP. last sleep study over 10 years ago  . Urinary tract infection    hx of  . Wears dentures    top   Past Surgical History:  Procedure Laterality Date  . ABDOMINAL HYSTERECTOMY    . APPENDECTOMY    . BACK SURGERY      x 3  . CARPAL TUNNEL RELEASE  left side only  . CATARACT EXTRACTION     bilateral  . CESAREAN SECTION    . COLON SURGERY     "due to large polyp"  . COLONOSCOPY    . FOOT SURGERY    . HERNIA REPAIR    . TENOLYSIS Left 11/12/2013   Procedure: LEFT TENDON GRAFT RECONSTRUCTION OF LEFT LONG FINGER SAGITTAL FIBERS CORD RELEASE;  Surgeon: Cammie Sickle., MD;  Location: [place];  Service: Orthopedics;  Laterality: Left;  . TOTAL HIP ARTHROPLASTY  06/09/2012   Procedure: TOTAL HIP ARTHROPLASTY;  Surgeon: [name], MD;  Location: [place];  Service: Orthopedics;  Laterality: Right;  Right total hip replacement  . TOTAL KNEE ARTHROPLASTY Right 12/10/2016   Procedure: RIGHT TOTAL KNEE ARTHROPLASTY;  Surgeon: [name],  MD;  Location: [place];  Service: Orthopedics;  Laterality: Right;   Social History:   reports that she quit smoking about 32 years ago. She has never used smokeless tobacco. She reports that she does not drink alcohol or use drugs.  Family History  Problem Relation Age of Onset  . Diabetes Mother   . Diabetes Father   . Diabetes Sister   . Diabetes Brother   . Heart disease Neg Hx   . Stroke Neg Hx   . Cancer Neg Hx     Medications: Patient's Medications  New Prescriptions   No medications on file  Previous Medications   ACETAMINOPHEN (TYLENOL) 500 MG TABLET    Take 500 mg by mouth every 8 (eight) hours as needed (for pain).    ARIPIPRAZOLE (ABILIFY) 5 MG TABLET    Take 5 mg by mouth every evening.    ATORVASTATIN (LIPITOR) 80 MG TABLET    Take 80 mg by mouth daily at 6 PM.    CLONIDINE (CATAPRES) 0.2 MG TABLET    Take 0.2 mg by mouth 2 (two) times daily.   COMBIVENT RESPIMAT 20-100 MCG/ACT AERS RESPIMAT    Inhale 1 puff into the lungs every 6 (six) hours as needed for wheezing or shortness of breath. For shortness of breath.   DABIGATRAN (PRADAXA) 150 MG CAPS    Take 150 mg by mouth every other day. Stopped prior to procedure 11-16-16   DICLOFENAC SODIUM (VOLTAREN) 1 % GEL    Apply 2 g topically 4 (four) times daily as needed. For pain.   DILTIAZEM (CARDIZEM CD) 240 MG 24 HR CAPSULE    Take 240 mg by mouth daily.   ESCITALOPRAM (LEXAPRO) 10 MG TABLET    Take 10 mg by mouth daily.   FERROUS SULFATE 325 (65 FE) MG TABLET    Take 1 tablet (325 mg total) by mouth 2 (two) times daily with a meal.   FOLIC ACID (FOLVITE) 1 MG TABLET    Take 1 mg by mouth every evening.    FUROSEMIDE (LASIX) 40 MG TABLET    Take 40 mg by mouth daily.   GABAPENTIN (NEURONTIN) 100 MG CAPSULE    Take 100 mg by mouth 2 (two) times daily.   HYDROCODONE-ACETAMINOPHEN (NORCO/VICODIN) 5-325 MG TABLET    Take 1 tablet by mouth every 6 (six) hours as needed for moderate pain.   HYDROXYCHLOROQUINE (PLAQUENIL) 200 MG  TABLET    Take 200 mg by mouth 2 (two) times daily.    HYDROXYPROPYL METHYLCELLULOSE / HYPROMELLOSE (ISOPTO TEARS / GONIOVISC) 2.5 % OPHTHALMIC SOLUTION    Place 1-2 drops into both eyes 3 (three) times daily as needed for  dry eyes.   MYRBETRIQ 50 MG TB24 TABLET    Take 50 mg by mouth daily.   POTASSIUM CHLORIDE SA (K-DUR,KLOR-CON) 20 MEQ TABLET    Take 20 mEq by mouth daily.   PRAMIPEXOLE (MIRAPEX) 0.5 MG TABLET    Take 0.5 mg by mouth at bedtime.   PREGABALIN (LYRICA) 50 MG CAPSULE    Take 50 mg by mouth 2 (two) times daily.   RABEPRAZOLE (ACIPHEX) 20 MG TABLET    Take 20 mg by mouth daily.   RANITIDINE (ZANTAC) 300 MG TABLET    Take 300 mg by mouth daily at 2 PM.   VITAMIN D, ERGOCALCIFEROL, (DRISDOL) 50000 UNITS CAPS CAPSULE    Take 50,000 Units by mouth every 30 (thirty) days.  Modified Medications   No medications on file  Discontinued Medications   GLIMEPIRIDE (AMARYL) 1 MG TABLET    Take 1 mg by mouth daily as needed (if AM CBG >200).    HYDROCODONE-ACETAMINOPHEN (NORCO/VICODIN) 5-325 MG TABLET    Take 1-2 tablets by mouth every 4 (four) hours as needed for moderate pain or severe pain (pain).   PREGABALIN (LYRICA) 100 MG CAPSULE    Take 1 capsule (100 mg total) by mouth at bedtime.   PREGABALIN (LYRICA) 50 MG CAPSULE    Take 1 capsule (50 mg total) by mouth daily. Take in the AM daily, different dose at night time.   SITAGLIPTIN (JANUVIA) 50 MG TABLET    Take 50 mg by mouth daily.     Physical Exam: Vitals:   12/20/16 1002  BP: 135/70  Pulse: (!) 56  Resp: 18  Temp: (!) 96.8 F (36 C)  SpO2: 93%  Weight: 160 lb (72.6 kg)  Height: 5' (1.524 m)    Physical Exam  Constitutional: She is oriented to person, place, and time. She appears well-developed and well-nourished. No distress.  HENT:  Head: Normocephalic and atraumatic.  Nose: Nose normal.  Mouth/Throat: Oropharynx is clear and moist.  Eyes: Conjunctivae are normal. Pupils are equal, round, and reactive to light.    Neck: Normal range of motion. Neck supple.  Cardiovascular: Normal rate, regular rhythm, normal heart sounds and intact distal pulses.   Pulmonary/Chest: Effort normal. She has rhonchi (throughout).  Abdominal: Soft. Bowel sounds are normal.  Musculoskeletal: She exhibits no edema or tenderness.  Neurological: She is alert and oriented to person, place, and time.  Skin: Skin is warm and dry. She is not diaphoretic.  incision with steristrips to left knee  Psychiatric: She has a normal mood and affect.    Labs reviewed: Basic Metabolic Panel:  Recent Labs  12/11/16 0732 12/12/16 0558 12/13/16 0909  NA 140 136 133*  K 4.4 3.9 3.8  CL 107 100* 103  CO2 22 25 23   GLUCOSE 134* 117* 157*  BUN 26* 21* 23*  CREATININE 1.76* 1.67* 1.53*  CALCIUM 8.6* 8.2* 8.1*   Liver Function Tests:  Recent Labs  08/27/16 1320 11/23/16 1348  AST 20 38  ALT 14 19  ALKPHOS 55 40  BILITOT 0.3 0.3  PROT 6.5 6.9  ALBUMIN 3.4* 3.4*   No results for input(s): LIPASE, AMYLASE in the last 8760 hours. No results for input(s): AMMONIA in the last 8760 hours. CBC:  Recent Labs  12/11/16 0732 12/12/16 0558 12/13/16 0909 12/14/16 0837  WBC 15.2* 16.5* 13.7*  --   NEUTROABS  --   --  9.5*  --   HGB 9.7* 9.0* 7.9* 8.4*  HCT 31.8* 29.1* 25.4*  26.7*  MCV 89.1 88.2 88.2  --   PLT 263 228 211  --    TSH: No results for input(s): TSH in the last 8760 hours. A1C: Lab Results  Component Value Date   HGBA1C 6.0 (H) 12/10/2016   Lipid Panel: No results for input(s): CHOL, HDL, LDLCALC, TRIG, CHOLHDL, LDLDIRECT in the last 8760 hours.  Radiological Exams: Dg Chest Port 1 View  Result Date: 12/10/2016 CLINICAL DATA:  Status post central venous catheter placement prior to knee surgery. EXAM: PORTABLE CHEST 1 VIEW COMPARISON:  PA and lateral chest x-ray of September 10, 2016 FINDINGS: The lungs are well-expanded. There is no pneumothorax or pleural effusion. The interstitial markings are mildly  prominent though stable. The cardiac silhouette is top-normal in size. The pulmonary vascularity is normal. There is calcification in the wall of the aortic arch. The right internal jugular venous catheter tip projects over the junction of the middle and distal thirds of the SVC. The observed bony thorax is unremarkable. IMPRESSION: There is no immediate postprocedure complication following placement of the right internal jugular venous catheter. The tip of the catheter projects at the junction of the middle and distal SVC. Mild chronic bronchitic changes. Thoracic aortic atherosclerosis. Electronically Signed   By: David  Martinique M.D.   On: 12/10/2016 11:39    Assessment/Plan 1. Bronchitis Will start doxycyline 100 mg PO daily for 7 days -chest xray to rule out pneumonia.  mucinex DM by mouth twice daily with full glass of water  2. Postoperative urinary retention -conts with foley, urology appt scheduled for next week.   3. Postoperative anemia due to acute blood loss -hgb improving at this time. Will cont to follow  4. OAB (overactive bladder) To dc oxybutynin, may use home supply of myrbetriq   Gloria Best K. Harle Battiest  [facility] [phone] 8 am - 5 pm) [phone] (after hours)

## 2016-12-21 ENCOUNTER — Encounter (HOSPITAL_COMMUNITY): Payer: Self-pay

## 2016-12-21 ENCOUNTER — Inpatient Hospital Stay (HOSPITAL_COMMUNITY)
Admission: EM | Admit: 2016-12-21 | Discharge: 2016-12-27 | DRG: 698 | Disposition: A | Discharge status: Home Health | Payer: Medicare Other | Attending: Internal Medicine | Admitting: Internal Medicine

## 2016-12-21 ENCOUNTER — Emergency Department (HOSPITAL_COMMUNITY): Payer: Medicare Other

## 2016-12-21 DIAGNOSIS — K219 Gastro-esophageal reflux disease without esophagitis: Secondary | ICD-10-CM | POA: Diagnosis present

## 2016-12-21 DIAGNOSIS — T83511A Infection and inflammatory reaction due to indwelling urethral catheter, initial encounter: Secondary | ICD-10-CM | POA: Diagnosis not present

## 2016-12-21 DIAGNOSIS — M549 Dorsalgia, unspecified: Secondary | ICD-10-CM | POA: Diagnosis present

## 2016-12-21 DIAGNOSIS — N179 Acute kidney failure, unspecified: Secondary | ICD-10-CM | POA: Diagnosis not present

## 2016-12-21 DIAGNOSIS — K920 Hematemesis: Secondary | ICD-10-CM

## 2016-12-21 DIAGNOSIS — Z96641 Presence of right artificial hip joint: Secondary | ICD-10-CM | POA: Diagnosis present

## 2016-12-21 DIAGNOSIS — Z87440 Personal history of urinary (tract) infections: Secondary | ICD-10-CM

## 2016-12-21 DIAGNOSIS — R000 Tachycardia, unspecified: Secondary | ICD-10-CM

## 2016-12-21 DIAGNOSIS — G4733 Obstructive sleep apnea (adult) (pediatric): Secondary | ICD-10-CM | POA: Diagnosis present

## 2016-12-21 DIAGNOSIS — I129 Hypertensive chronic kidney disease with stage 1 through stage 4 chronic kidney disease, or unspecified chronic kidney disease: Secondary | ICD-10-CM | POA: Diagnosis present

## 2016-12-21 DIAGNOSIS — K5900 Constipation, unspecified: Secondary | ICD-10-CM | POA: Diagnosis not present

## 2016-12-21 DIAGNOSIS — E876 Hypokalemia: Secondary | ICD-10-CM | POA: Diagnosis present

## 2016-12-21 DIAGNOSIS — I471 Supraventricular tachycardia: Secondary | ICD-10-CM | POA: Diagnosis present

## 2016-12-21 DIAGNOSIS — J45909 Unspecified asthma, uncomplicated: Secondary | ICD-10-CM | POA: Diagnosis present

## 2016-12-21 DIAGNOSIS — E43 Unspecified severe protein-calorie malnutrition: Secondary | ICD-10-CM | POA: Insufficient documentation

## 2016-12-21 DIAGNOSIS — E1122 Type 2 diabetes mellitus with diabetic chronic kidney disease: Secondary | ICD-10-CM | POA: Diagnosis present

## 2016-12-21 DIAGNOSIS — A419 Sepsis, unspecified organism: Secondary | ICD-10-CM | POA: Diagnosis not present

## 2016-12-21 DIAGNOSIS — M069 Rheumatoid arthritis, unspecified: Secondary | ICD-10-CM | POA: Diagnosis present

## 2016-12-21 DIAGNOSIS — R109 Unspecified abdominal pain: Secondary | ICD-10-CM | POA: Diagnosis not present

## 2016-12-21 DIAGNOSIS — Z6830 Body mass index (BMI) 30.0-30.9, adult: Secondary | ICD-10-CM

## 2016-12-21 DIAGNOSIS — R05 Cough: Secondary | ICD-10-CM | POA: Diagnosis not present

## 2016-12-21 DIAGNOSIS — D72829 Elevated white blood cell count, unspecified: Secondary | ICD-10-CM

## 2016-12-21 DIAGNOSIS — Z7901 Long term (current) use of anticoagulants: Secondary | ICD-10-CM

## 2016-12-21 DIAGNOSIS — G8929 Other chronic pain: Secondary | ICD-10-CM | POA: Diagnosis present

## 2016-12-21 DIAGNOSIS — N390 Urinary tract infection, site not specified: Secondary | ICD-10-CM | POA: Diagnosis present

## 2016-12-21 DIAGNOSIS — F329 Major depressive disorder, single episode, unspecified: Secondary | ICD-10-CM | POA: Diagnosis present

## 2016-12-21 DIAGNOSIS — Z79899 Other long term (current) drug therapy: Secondary | ICD-10-CM

## 2016-12-21 DIAGNOSIS — J9811 Atelectasis: Secondary | ICD-10-CM | POA: Diagnosis present

## 2016-12-21 DIAGNOSIS — R112 Nausea with vomiting, unspecified: Secondary | ICD-10-CM | POA: Diagnosis not present

## 2016-12-21 DIAGNOSIS — I480 Paroxysmal atrial fibrillation: Secondary | ICD-10-CM | POA: Diagnosis present

## 2016-12-21 DIAGNOSIS — E785 Hyperlipidemia, unspecified: Secondary | ICD-10-CM | POA: Diagnosis present

## 2016-12-21 DIAGNOSIS — N319 Neuromuscular dysfunction of bladder, unspecified: Secondary | ICD-10-CM | POA: Diagnosis present

## 2016-12-21 DIAGNOSIS — R4182 Altered mental status, unspecified: Secondary | ICD-10-CM

## 2016-12-21 DIAGNOSIS — D649 Anemia, unspecified: Secondary | ICD-10-CM | POA: Diagnosis present

## 2016-12-21 DIAGNOSIS — F419 Anxiety disorder, unspecified: Secondary | ICD-10-CM | POA: Diagnosis present

## 2016-12-21 DIAGNOSIS — N189 Chronic kidney disease, unspecified: Secondary | ICD-10-CM | POA: Diagnosis present

## 2016-12-21 DIAGNOSIS — Z96651 Presence of right artificial knee joint: Secondary | ICD-10-CM | POA: Diagnosis present

## 2016-12-21 DIAGNOSIS — Z87891 Personal history of nicotine dependence: Secondary | ICD-10-CM

## 2016-12-21 DIAGNOSIS — Z9841 Cataract extraction status, right eye: Secondary | ICD-10-CM

## 2016-12-21 DIAGNOSIS — M797 Fibromyalgia: Secondary | ICD-10-CM | POA: Diagnosis present

## 2016-12-21 DIAGNOSIS — Y846 Urinary catheterization as the cause of abnormal reaction of the patient, or of later complication, without mention of misadventure at the time of the procedure: Secondary | ICD-10-CM | POA: Diagnosis present

## 2016-12-21 DIAGNOSIS — Z9842 Cataract extraction status, left eye: Secondary | ICD-10-CM

## 2016-12-21 DIAGNOSIS — E1142 Type 2 diabetes mellitus with diabetic polyneuropathy: Secondary | ICD-10-CM

## 2016-12-21 HISTORY — DX: Elevated white blood cell count, unspecified: D72.829

## 2016-12-21 LAB — TYPE AND SCREEN
ABO/RH(D): O POS
Antibody Screen: NEGATIVE

## 2016-12-21 LAB — LACTIC ACID, PLASMA
LACTIC ACID, VENOUS: 1.2 mmol/L (ref 0.5–1.9)
LACTIC ACID, VENOUS: 2 mmol/L — AB (ref 0.5–1.9)

## 2016-12-21 LAB — CBC
HCT: 30 % — ABNORMAL LOW (ref 36.0–46.0)
HEMOGLOBIN: 9.9 g/dL — AB (ref 12.0–15.0)
MCH: 27.6 pg (ref 26.0–34.0)
MCHC: 33 g/dL (ref 30.0–36.0)
MCV: 83.6 fL (ref 78.0–100.0)
Platelets: 730 10*3/uL — ABNORMAL HIGH (ref 150–400)
RBC: 3.59 MIL/uL — ABNORMAL LOW (ref 3.87–5.11)
RDW: 14.7 % (ref 11.5–15.5)
WBC: 27 10*3/uL — ABNORMAL HIGH (ref 4.0–10.5)

## 2016-12-21 LAB — COMPREHENSIVE METABOLIC PANEL
ALBUMIN: 2.8 g/dL — AB (ref 3.5–5.0)
ALK PHOS: 71 U/L (ref 38–126)
ALT: 14 U/L (ref 14–54)
ANION GAP: 20 — AB (ref 5–15)
AST: 22 U/L (ref 15–41)
BUN: 23 mg/dL — AB (ref 6–20)
CALCIUM: 9.4 mg/dL (ref 8.9–10.3)
CO2: 20 mmol/L — AB (ref 22–32)
CREATININE: 1.81 mg/dL — AB (ref 0.44–1.00)
Chloride: 95 mmol/L — ABNORMAL LOW (ref 101–111)
GFR calc Af Amer: 30 mL/min — ABNORMAL LOW (ref >60)
GFR calc non Af Amer: 26 mL/min — ABNORMAL LOW (ref >60)
GLUCOSE: 157 mg/dL — AB (ref 65–99)
Potassium: 3.6 mmol/L (ref 3.5–5.1)
SODIUM: 135 mmol/L (ref 135–145)
Total Bilirubin: 1 mg/dL (ref 0.3–1.2)
Total Protein: 8.1 g/dL (ref 6.5–8.1)

## 2016-12-21 LAB — LIPASE, BLOOD: Lipase: 13 U/L (ref 11–51)

## 2016-12-21 LAB — PROTIME-INR
INR: 1.12
PROTHROMBIN TIME: 14.5 s (ref 11.4–15.2)

## 2016-12-21 MED ORDER — HYDRALAZINE HCL 20 MG/ML IJ SOLN
10.0000 mg | Freq: Four times a day (QID) | INTRAMUSCULAR | Status: DC | PRN
  Administered 2016-12-21 – 2016-12-23 (×5): 10 mg via INTRAVENOUS
  Filled 2016-12-21 (×6): qty 1

## 2016-12-21 MED ORDER — PANTOPRAZOLE SODIUM 40 MG PO TBEC: 40.0000 mg | DELAYED_RELEASE_TABLET | Freq: Every day | ORAL | Status: DC

## 2016-12-21 MED ORDER — ONDANSETRON HCL 4 MG/2ML IJ SOLN
4.0000 mg | Freq: Once | INTRAMUSCULAR | Status: AC
  Administered 2016-12-21: 4 mg via INTRAVENOUS
  Filled 2016-12-21: qty 2

## 2016-12-21 MED ORDER — POTASSIUM CHLORIDE CRYS ER 20 MEQ PO TBCR
20.0000 meq | EXTENDED_RELEASE_TABLET | Freq: Every day | ORAL | Status: DC
  Filled 2016-12-21 (×2): qty 1

## 2016-12-21 MED ORDER — PROMETHAZINE HCL 25 MG/ML IJ SOLN
6.2500 mg | Freq: Four times a day (QID) | INTRAMUSCULAR | Status: DC | PRN
  Administered 2016-12-21 – 2016-12-22 (×2): 6.25 mg via INTRAVENOUS
  Filled 2016-12-21: qty 1

## 2016-12-21 MED ORDER — ESCITALOPRAM OXALATE 10 MG PO TABS
10.0000 mg | ORAL_TABLET | Freq: Every day | ORAL | Status: DC
  Administered 2016-12-22: 10 mg via ORAL
  Filled 2016-12-21 (×2): qty 1

## 2016-12-21 MED ORDER — ALBUTEROL SULFATE (2.5 MG/3ML) 0.083% IN NEBU: 2.5000 mg | INHALATION_SOLUTION | RESPIRATORY_TRACT | Status: DC | PRN

## 2016-12-21 MED ORDER — PIPERACILLIN-TAZOBACTAM 3.375 G IVPB
3.3750 g | Freq: Three times a day (TID) | INTRAVENOUS | Status: DC
  Administered 2016-12-21 – 2016-12-22 (×2): 3.375 g via INTRAVENOUS
  Filled 2016-12-21 (×3): qty 50

## 2016-12-21 MED ORDER — DILTIAZEM HCL ER COATED BEADS 240 MG PO CP24
240.0000 mg | ORAL_CAPSULE | Freq: Every day | ORAL | Status: DC
  Administered 2016-12-22: 240 mg via ORAL
  Filled 2016-12-21: qty 2

## 2016-12-21 MED ORDER — MILK AND MOLASSES ENEMA
1.0000 | Freq: Once | RECTAL | Status: DC
  Filled 2016-12-21: qty 250

## 2016-12-21 MED ORDER — PIPERACILLIN-TAZOBACTAM 3.375 G IVPB 30 MIN
3.3750 g | Freq: Once | INTRAVENOUS | Status: AC
Start: 2016-12-21 — End: 2016-12-22
  Administered 2016-12-21: 3.375 g via INTRAVENOUS
  Filled 2016-12-21: qty 50

## 2016-12-21 MED ORDER — FUROSEMIDE 40 MG PO TABS
40.0000 mg | ORAL_TABLET | Freq: Every day | ORAL | Status: DC
  Filled 2016-12-21: qty 1

## 2016-12-21 MED ORDER — CLONIDINE HCL 0.2 MG PO TABS
0.2000 mg | ORAL_TABLET | Freq: Two times a day (BID) | ORAL | Status: DC
  Administered 2016-12-22: 0.2 mg via ORAL
  Filled 2016-12-21 (×2): qty 1

## 2016-12-21 MED ORDER — PANTOPRAZOLE SODIUM 40 MG IV SOLR
40.0000 mg | Freq: Two times a day (BID) | INTRAVENOUS | Status: DC
  Administered 2016-12-21 – 2016-12-22 (×3): 40 mg via INTRAVENOUS
  Filled 2016-12-21 (×3): qty 40

## 2016-12-21 MED ORDER — FAMOTIDINE 20 MG PO TABS
20.0000 mg | ORAL_TABLET | Freq: Every day | ORAL | Status: DC
  Filled 2016-12-21: qty 1

## 2016-12-21 MED ORDER — SODIUM CHLORIDE 0.9 % IV BOLUS (SEPSIS)
500.0000 mL | Freq: Once | INTRAVENOUS | Status: AC
  Administered 2016-12-21: 500 mL via INTRAVENOUS

## 2016-12-21 MED ORDER — ONDANSETRON HCL 4 MG/2ML IJ SOLN
4.0000 mg | Freq: Four times a day (QID) | INTRAMUSCULAR | Status: DC | PRN
  Administered 2016-12-21 – 2016-12-25 (×3): 4 mg via INTRAVENOUS
  Filled 2016-12-21 (×5): qty 2

## 2016-12-21 MED ORDER — ACETAMINOPHEN 325 MG PO TABS
650.0000 mg | ORAL_TABLET | Freq: Four times a day (QID) | ORAL | Status: DC | PRN
Start: 2016-12-21 — End: 2016-12-27

## 2016-12-21 MED ORDER — PIPERACILLIN-TAZOBACTAM 3.375 G IVPB 30 MIN: 3.3750 g | Freq: Three times a day (TID) | INTRAVENOUS | Status: DC

## 2016-12-21 MED ORDER — ONDANSETRON HCL 4 MG PO TABS: 4.0000 mg | ORAL_TABLET | Freq: Four times a day (QID) | ORAL | Status: DC | PRN

## 2016-12-21 MED ORDER — ACETAMINOPHEN 650 MG RE SUPP
650.0000 mg | Freq: Four times a day (QID) | RECTAL | Status: DC | PRN
Start: 2016-12-21 — End: 2016-12-27

## 2016-12-21 MED ORDER — FENTANYL CITRATE (PF) 100 MCG/2ML IJ SOLN
50.0000 ug | Freq: Once | INTRAMUSCULAR | Status: AC
  Administered 2016-12-21: 50 ug via INTRAVENOUS
  Filled 2016-12-21: qty 2

## 2016-12-21 MED ORDER — PANTOPRAZOLE SODIUM 40 MG IV SOLR
40.0000 mg | Freq: Once | INTRAVENOUS | Status: AC
  Administered 2016-12-21: 40 mg via INTRAVENOUS
  Filled 2016-12-21: qty 40

## 2016-12-21 MED ORDER — HYDROCODONE-ACETAMINOPHEN 5-325 MG PO TABS: 1.0000 | ORAL_TABLET | Freq: Four times a day (QID) | ORAL | Status: DC | PRN

## 2016-12-21 MED ORDER — PRAMIPEXOLE DIHYDROCHLORIDE 0.25 MG PO TABS
0.5000 mg | ORAL_TABLET | Freq: Every day | ORAL | Status: DC
  Filled 2016-12-21 (×2): qty 2

## 2016-12-21 MED ORDER — DABIGATRAN ETEXILATE MESYLATE 150 MG PO CAPS
150.0000 mg | ORAL_CAPSULE | ORAL | Status: DC
  Administered 2016-12-23 – 2016-12-25 (×2): 150 mg via ORAL
  Filled 2016-12-21 (×3): qty 1

## 2016-12-21 MED ORDER — HYDROXYCHLOROQUINE SULFATE 200 MG PO TABS
200.0000 mg | ORAL_TABLET | Freq: Two times a day (BID) | ORAL | Status: DC
  Filled 2016-12-21 (×2): qty 1

## 2016-12-21 MED ORDER — GLYCERIN (LAXATIVE) 2.1 G RE SUPP
1.0000 | RECTAL | Status: AC
  Administered 2016-12-21: 1 via RECTAL
  Filled 2016-12-21 (×2): qty 1

## 2016-12-21 MED ORDER — ATORVASTATIN CALCIUM 80 MG PO TABS
80.0000 mg | ORAL_TABLET | Freq: Every day | ORAL | Status: DC
  Administered 2016-12-22 – 2016-12-26 (×5): 80 mg via ORAL
  Filled 2016-12-21 (×2): qty 1
  Filled 2016-12-21: qty 2
  Filled 2016-12-21 (×3): qty 1

## 2016-12-21 MED ORDER — ARIPIPRAZOLE 5 MG PO TABS
5.0000 mg | ORAL_TABLET | Freq: Every evening | ORAL | Status: DC
  Filled 2016-12-21 (×2): qty 1

## 2016-12-21 MED ORDER — SODIUM CHLORIDE 0.9% FLUSH
3.0000 mL | Freq: Two times a day (BID) | INTRAVENOUS | Status: DC
  Administered 2016-12-22 – 2016-12-27 (×8): 3 mL via INTRAVENOUS

## 2016-12-21 MED ORDER — SODIUM CHLORIDE 0.9 % IV SOLN
INTRAVENOUS | Status: DC
  Administered 2016-12-21 – 2016-12-22 (×2): via INTRAVENOUS

## 2016-12-21 NOTE — Progress Notes (Signed)
CRITICAL VALUE ALERT  Critical value received:  Lactic Acid 2.0  Date of notification:  12/21/2016  Time of notification:  1942  Critical value read back:Yes.    Nurse who received alert:  Cardell Peach Alyse Low  MD notified (1st page):  Maudie Mercury MD  Time of first page:  1945  MD notified (2nd page):  Time of second page:  Responding MD:  Maudie Mercury MD  Time MD responded:  1950  MD ordered repeat lactic acid for am. Pt currently getting fluids.   Eleanora Neighbor, RN

## 2016-12-21 NOTE — Progress Notes (Signed)
New Admission Note:   Arrival Method: Bed Mental Orientation: A&O X4  Telemetry: Initiated Assessment: Completed Skin: Clean, Dry, Intact Iv: Infusing Pain: Denie Unit Orientation: Patient has been orientated to the room, unit and staff.  Family: At bedside  Orders have been reviewed and implemented. Will continue to monitor the patient. Call light has been placed within reach and bed alarm has been activated.    Dixie Dials RN, BSN

## 2016-12-21 NOTE — Progress Notes (Signed)
Pharmacy Antibiotic Note  Gloria Best is a 76 y.o. female admitted on 12/21/2016 with HCAP.  Pharmacy has been consulted for Zosyn dosing.  Plan: -Zosyn 3.375g IV x1 over 30 min -Zosyn 3.375g IV q8h EI -Monitor cultures, renal function, LOT    Temp (24hrs), Avg:99.1 F (37.3 C), Min:99.1 F (37.3 C), Max:99.1 F (37.3 C)   Recent Labs Lab 12/21/16 1154  WBC 27.0*  CREATININE 1.81*    Estimated Creatinine Clearance: 23.5 mL/min (by C-G formula based on SCr of 1.81 mg/dL (H)).    Allergies  Allergen Reactions  . Adhesive [Tape] Other (See Comments)    Tears skin  . Latex Other (See Comments)    Blisters  . Lotrel [Amlodipine Besy-Benazepril Hcl] Itching and Cough    Antimicrobials this admission: 2/16 Zosyn >>   Dose adjustments this admission: none  Microbiology results: 2/16 BCx: IP  Thank you for allowing pharmacy to be a part of this patient's care.  Arrie Senate, PharmD PGY-1 Pharmacy Resident Pager: [phone] 12/21/2016

## 2016-12-21 NOTE — ED Provider Notes (Signed)
[facility] DEPT Provider Note   CSN: [id number] Arrival date & time: 12/21/16  1127     History   Chief Complaint Chief Complaint  Patient presents with  . Hematemesis    HPI Gloria Best is a 76 y.o. female.  HPI  76 year old female presents with vomiting since last night. She is currently in a rehabilitation facility because she had a knee operation 10 days ago. She is currently on Pradaxa for anticoagulation. She has a history of vital hernia but denies any upper abdominal or stomach problems otherwise. Has had a cough for a few days and was put on an antibiotic yesterday for bronchitis. Normal BM this morning without blood. No previous or current ETOH use  Past Medical History:  Diagnosis Date  . Anemia    hx of blood transfusion  . Anxiety   . Arthritis    RA  . Asthma    hx of as child  . Bladder disorder    having difficulty voiding, requires in and out catheter 2x day and prn; denied current need for I&O cath 08/27/16  . Bronchitis    hx of  . Carpal tunnel syndrome of left wrist    hx of  . Chronic back pain   . Chronic kidney disease   . Depression    takes medication for   . Diabetes mellitus    oral medication  . Dysrhythmia    takes cardizem, sees Dr. Marco Collie primary  . Fibromyalgia    takes lyrica  . GERD (gastroesophageal reflux disease)   . H/O hiatal hernia    hx of   . Heart murmur   . Hypertension   . Pneumonia    hx of  . Sleep apnea    no CPAP. last sleep study over 10 years ago  . Urinary tract infection    hx of  . Wears dentures    top    Patient Active Problem List   Diagnosis Date Noted  . At risk for adverse drug event 12/14/2016  . Type 2 diabetes mellitus with polyneuropathy ([place]) 12/13/2016    Class: Chronic  . Postoperative urinary retention 12/13/2016    Class: Acute  . Unilateral primary osteoarthritis, right knee 12/10/2016    Class: Chronic  . Tricompartment osteoarthritis of right knee 12/10/2016  .  Postoperative anemia due to acute blood loss 06/12/2012    Class: Acute  . AVN (avascular necrosis of bone) ([place]) 06/09/2012    Class: Chronic  . Osteoarthritis of right hip 06/09/2012    Class: Chronic    Past Surgical History:  Procedure Laterality Date  . ABDOMINAL HYSTERECTOMY    . APPENDECTOMY    . BACK SURGERY      x 3  . CARPAL TUNNEL RELEASE     left side only  . CATARACT EXTRACTION     bilateral  . CESAREAN SECTION    . COLON SURGERY     "due to large polyp"  . COLONOSCOPY    . FOOT SURGERY    . HERNIA REPAIR    . TENOLYSIS Left 11/12/2013   Procedure: LEFT TENDON GRAFT RECONSTRUCTION OF LEFT LONG FINGER SAGITTAL FIBERS CORD RELEASE;  Surgeon: Cammie Sickle., MD;  Location: [facility];  Service: Orthopedics;  Laterality: Left;  . TOTAL HIP ARTHROPLASTY  06/09/2012   Procedure: TOTAL HIP ARTHROPLASTY;  Surgeon: [name], MD;  Location: [facility];  Service: Orthopedics;  Laterality: Right;  Right total hip  replacement  . TOTAL KNEE ARTHROPLASTY Right 12/10/2016   Procedure: RIGHT TOTAL KNEE ARTHROPLASTY;  Surgeon: [name], MD;  Location: [facility];  Service: Orthopedics;  Laterality: Right;    OB History    No data available       Home Medications    Prior to Admission medications   Medication Sig Start Date End Date Taking? Authorizing Provider  acetaminophen (TYLENOL) 500 MG tablet Take 500 mg by mouth every 8 (eight) hours as needed (for pain).     Historical Provider, MD  ARIPiprazole (ABILIFY) 5 MG tablet Take 5 mg by mouth every evening.     Historical Provider, MD  atorvastatin (LIPITOR) 80 MG tablet Take 80 mg by mouth daily at 6 PM.  06/29/16   Historical Provider, MD  cloNIDine (CATAPRES) 0.2 MG tablet Take 0.2 mg by mouth 2 (two) times daily. 08/12/16   Historical Provider, MD  COMBIVENT RESPIMAT 20-100 MCG/ACT AERS respimat Inhale 1 puff into the lungs every 6 (six) hours as needed for wheezing or shortness of breath. For shortness of  breath. 06/20/16   Historical Provider, MD  dabigatran (PRADAXA) 150 MG CAPS Take 150 mg by mouth every other day. Stopped prior to procedure 11-16-16    Historical Provider, MD  diclofenac sodium (VOLTAREN) 1 % GEL Apply 2 g topically 4 (four) times daily as needed. For pain. 07/18/16   Historical Provider, MD  diltiazem (CARDIZEM CD) 240 MG 24 hr capsule Take 240 mg by mouth daily. 06/12/16   Historical Provider, MD  escitalopram (LEXAPRO) 10 MG tablet Take 10 mg by mouth daily.    Historical Provider, MD  ferrous sulfate 325 (65 FE) MG tablet Take 1 tablet (325 mg total) by mouth 2 (two) times daily with a meal. 12/14/16   Lanae Crumbly, PA-C  folic acid (FOLVITE) 1 MG tablet Take 1 mg by mouth every evening.     Historical Provider, MD  furosemide (LASIX) 40 MG tablet Take 40 mg by mouth daily. 06/29/16   Historical Provider, MD  gabapentin (NEURONTIN) 100 MG capsule Take 100 mg by mouth 2 (two) times daily.    Historical Provider, MD  HYDROcodone-acetaminophen (NORCO/VICODIN) 5-325 MG tablet Take 1 tablet by mouth every 6 (six) hours as needed for moderate pain.    Historical Provider, MD  hydroxychloroquine (PLAQUENIL) 200 MG tablet Take 200 mg by mouth 2 (two) times daily.  07/26/16   Historical Provider, MD  hydroxypropyl methylcellulose / hypromellose (ISOPTO TEARS / GONIOVISC) 2.5 % ophthalmic solution Place 1-2 drops into both eyes 3 (three) times daily as needed for dry eyes.    Historical Provider, MD  MYRBETRIQ 50 MG TB24 tablet Take 50 mg by mouth daily. 06/20/16   Historical Provider, MD  potassium chloride SA (K-DUR,KLOR-CON) 20 MEQ tablet Take 20 mEq by mouth daily.    Historical Provider, MD  pramipexole (MIRAPEX) 0.5 MG tablet Take 0.5 mg by mouth at bedtime.    Historical Provider, MD  pregabalin (LYRICA) 50 MG capsule Take 50 mg by mouth 2 (two) times daily.    Historical Provider, MD  RABEprazole (ACIPHEX) 20 MG tablet Take 20 mg by mouth daily. 06/27/16   Historical Provider, MD    ranitidine (ZANTAC) 300 MG tablet Take 300 mg by mouth daily at 2 PM. 07/12/16   Historical Provider, MD  Vitamin D, Ergocalciferol, (DRISDOL) 50000 units CAPS capsule Take 50,000 Units by mouth every 30 (thirty) days. 07/12/16   Historical Provider, MD  Family History Family History  Problem Relation Age of Onset  . Diabetes Mother   . Diabetes Father   . Diabetes Sister   . Diabetes Brother   . Heart disease Neg Hx   . Stroke Neg Hx   . Cancer Neg Hx     Social History Social History  Substance Use Topics  . Smoking status: Former Smoker    Quit date: 11/05/1984  . Smokeless tobacco: Never Used     Comment: stopped smoking 1983  . Alcohol use No     Allergies   Adhesive [tape]; Latex; and Lotrel [amlodipine besy-benazepril hcl]   Review of Systems Review of Systems  Constitutional: Negative for fever.  Respiratory: Positive for cough. Negative for shortness of breath.   Cardiovascular: Negative for chest pain.  Gastrointestinal: Positive for abdominal pain, nausea and vomiting. Negative for blood in stool, constipation and diarrhea.  All other systems reviewed and are negative.    Physical Exam Updated Vital Signs BP (!) 170/52   Pulse 90   Temp 99.1 F (37.3 C) (Oral)   Resp (!) 27   SpO2 98%   Physical Exam  Constitutional: She is oriented to person, place, and time. She appears well-developed and well-nourished.  HENT:  Head: Normocephalic and atraumatic.  Right Ear: External ear normal.  Left Ear: External ear normal.  Nose: Nose normal.  Eyes: Right eye exhibits no discharge. Left eye exhibits no discharge.  Cardiovascular: Normal rate, regular rhythm and normal heart sounds.   Pulmonary/Chest: Effort normal and breath sounds normal.  Abdominal: Soft. There is tenderness in the right upper quadrant, epigastric area and left upper quadrant.  Neurological: She is alert and oriented to person, place, and time.  Skin: Skin is warm and dry. She is not  diaphoretic.  Nursing note and vitals reviewed.    ED Treatments / Results  Labs (all labs ordered are listed, but only abnormal results are displayed) Labs Reviewed  COMPREHENSIVE METABOLIC PANEL - Abnormal; Notable for the following:       Result Value   Chloride 95 (*)    CO2 20 (*)    Glucose, Bld 157 (*)    BUN 23 (*)    Creatinine, Ser 1.81 (*)    Albumin 2.8 (*)    GFR calc non Af Amer 26 (*)    GFR calc Af Amer 30 (*)    Anion gap 20 (*)    All other components within normal limits  CBC - Abnormal; Notable for the following:    WBC 27.0 (*)    RBC 3.59 (*)    Hemoglobin 9.9 (*)    HCT 30.0 (*)    Platelets 730 (*)    All other components within normal limits  LACTIC ACID, PLASMA - Abnormal; Notable for the following:    Lactic Acid, Venous 2.0 (*)    All other components within normal limits  CULTURE, BLOOD (ROUTINE X 2)  CULTURE, BLOOD (ROUTINE X 2)  MRSA PCR SCREENING  LIPASE, BLOOD  PROTIME-INR  LACTIC ACID, PLASMA  OCCULT BLOOD GASTRIC / DUODENUM (SPECIMEN CUP)  URINALYSIS, ROUTINE W REFLEX MICROSCOPIC  BASIC METABOLIC PANEL  CBC  OCCULT BLOOD X 1 CARD TO LAB, STOOL  LACTIC ACID, PLASMA  TYPE AND SCREEN    EKG  EKG Interpretation None       Radiology Ct Abdomen Pelvis Wo Contrast  Result Date: 12/21/2016 CLINICAL DATA:  Acute mid abdominal pain. EXAM: CT ABDOMEN AND PELVIS WITHOUT CONTRAST TECHNIQUE:  Multidetector CT imaging of the abdomen and pelvis was performed following the standard protocol without IV contrast. COMPARISON:  CT scan of September 18, 2014. FINDINGS: Lower chest: Mild bilateral posterior basilar subsegmental atelectasis is noted. Hepatobiliary: No focal liver abnormality is seen. No gallstones, gallbladder wall thickening, or biliary dilatation. Pancreas: Unremarkable. No pancreatic ductal dilatation or surrounding inflammatory changes. Spleen: Normal in size without focal abnormality. Adrenals/Urinary Tract: Adrenal glands appear  normal. No hydronephrosis or renal obstruction is noted. No renal or ureteral calculi are noted. Urinary bladder is decompressed secondary to Foley catheter. Stomach/Bowel: Large amount of stool is noted throughout the colon and rectum concerning for impaction. No small bowel dilatation is noted. Vascular/Lymphatic: Aortic atherosclerosis. No enlarged abdominal or pelvic lymph nodes. Reproductive: Status post hysterectomy. No adnexal masses. Other: No abdominal wall hernia or abnormality. No abdominopelvic ascites. Musculoskeletal: Status post right hip arthroplasty. Status post surgical fusion of lower lumbar spine. IMPRESSION: Aortic atherosclerosis. Large amount of stool seen throughout the colon and rectum concerning for rectal impaction. No other significant abnormality seen in the abdomen or pelvis. Electronically Signed   By: Marijo Conception, M.D.   On: 12/21/2016 14:56   Dg Chest Portable 1 View  Result Date: 12/21/2016 CLINICAL DATA:  Cough, vomiting and abdominal pain. EXAM: PORTABLE CHEST 1 VIEW COMPARISON:  12/10/2016 FINDINGS: The heart is normal in size. The mediastinal and hilar contours are stable. Mild tortuosity of the thoracic aorta. The lungs are clear. No pleural effusion. The right IJ catheter has been removed. The bony thorax is intact. IMPRESSION: No acute cardiopulmonary findings. Electronically Signed   By: Marijo Sanes M.D.   On: 12/21/2016 12:21    Procedures Procedures (including critical care time)  Medications Ordered in ED Medications  fentaNYL (SUBLIMAZE) injection 50 mcg (not administered)  pantoprazole (PROTONIX) injection 40 mg (not administered)  ondansetron (ZOFRAN) injection 4 mg (not administered)     Initial Impression / Assessment and Plan / ED Course  I have reviewed the triage vital signs and the nursing notes.  Pertinent labs & imaging results that were available during my care of the patient were reviewed by me and considered in my medical decision  making (see chart for details).  Clinical Course as of Dec 22 1335  Fri Dec 21, 2016  1150 Patient appears stable, but with concern for hematemesis, will start IV Protonix, IV Zofran, and IV fentanyl for pain. Upper abdominal tenderness and no signs of peritonitis. Will get chest x-ray given recent cough.  [SG]  1309 Pain is better, still vomiting and still nauseated. Given degree of white blood cell count elevation will get CT as well to rule out other pathology.  [SG]    Clinical Course User Index [SG] Sherwood Gambler, MD    Patient's CT does not show acute pathology. Has significant constipation. Patient has had multiple bowel movements in the ED so I highly doubt impaction. The facility states her vomit tested positive for blood but lab wasn't able to test our sample and now is no longer vomiting. Given she's on pradaxa with concern for upper gi bleed, will admit. Unclear where the WBC elevation is coming from, no obvious PNA.  Final Clinical Impressions(s) / ED Diagnoses   Final diagnoses:  Hematemesis with nausea  Elevated WBCs  Constipated  Elevated WBCs  Constipated    New Prescriptions New Prescriptions   No medications on file     Sherwood Gambler, MD 12/21/16 2023

## 2016-12-21 NOTE — ED Triage Notes (Signed)
Pt. Here for hematemesis that started last night. She recently had knee surgery on the 11th. CO of upper abdominal pain, no diarrhea

## 2016-12-21 NOTE — ED Notes (Signed)
Report attempted 

## 2016-12-21 NOTE — Progress Notes (Signed)
The patients family is asking if the patients foley can be removed. Aggie Moats, MD notified. Ordered to leave foley in. Will continue to monitor.

## 2016-12-21 NOTE — H&P (Signed)
[facility] History and Physical  Gloria Best YWV:[id number] DOB: May 06, 1941 DOA: 12/21/2016  Referring physician:  PCP: Marco Collie, MD   Chief Complaint: Hematemesis  HPI: Gloria Best is a 76 y.o. female  past mental history significant for of anemia, anxiety, asthma, bladder disorder, recurrent UTIs, chronic kidney disease, diabetes MR Roger, reflux, hypertension, pneumonia, sleep apnea presents to emergency room with chief complaint hematemesis from her nursing facility. Per report from EDP patient was sent over for multiple episodes of emesis earlier today. One sample was checked and was positive for blood. EDP claims that blood was collected and sent for testing but the sample was lost so we don't have hospital confirmed blood in vomit. EDP recommended consulting GI for upper GI bleed. Patient has had multiple normal bowel movements in the emergency room. CT of the abdomen and pelvis showed significant constipation. Patient has had no melena. Occult stools not collected.    Review of Systems:  As per HPI otherwise 10 point review of systems negative.    Past Medical History:  Diagnosis Date  . Anemia    hx of blood transfusion  . Anxiety   . Arthritis    RA  . Asthma    hx of as child  . Bladder disorder    having difficulty voiding, requires in and out catheter 2x day and prn; denied current need for I&O cath 08/27/16  . Bronchitis    hx of  . Carpal tunnel syndrome of left wrist    hx of  . Chronic back pain   . Chronic kidney disease   . Depression    takes medication for   . Diabetes mellitus    oral medication  . Dysrhythmia    takes cardizem, sees Dr. Marco Collie primary  . Fibromyalgia    takes lyrica  . GERD (gastroesophageal reflux disease)   . H/O hiatal hernia    hx of   . Heart murmur   . Hypertension   . Pneumonia    hx of  . Sleep apnea    no CPAP. last sleep study over 10 years ago  . Urinary tract infection    hx of  . Wears  dentures    top   Past Surgical History:  Procedure Laterality Date  . ABDOMINAL HYSTERECTOMY    . APPENDECTOMY    . BACK SURGERY      x 3  . CARPAL TUNNEL RELEASE     left side only  . CATARACT EXTRACTION     bilateral  . CESAREAN SECTION    . COLON SURGERY     "due to large polyp"  . COLONOSCOPY    . FOOT SURGERY    . HERNIA REPAIR    . TENOLYSIS Left 11/12/2013   Procedure: LEFT TENDON GRAFT RECONSTRUCTION OF LEFT LONG FINGER SAGITTAL FIBERS CORD RELEASE;  Surgeon: Cammie Sickle., MD;  Location: [facility];  Service: Orthopedics;  Laterality: Left;  . TOTAL HIP ARTHROPLASTY  06/09/2012   Procedure: TOTAL HIP ARTHROPLASTY;  Surgeon: [name], MD;  Location: [facility];  Service: Orthopedics;  Laterality: Right;  Right total hip replacement  . TOTAL KNEE ARTHROPLASTY Right 12/10/2016   Procedure: RIGHT TOTAL KNEE ARTHROPLASTY;  Surgeon: [name], MD;  Location: [place];  Service: Orthopedics;  Laterality: Right;   Social History:  reports that she quit smoking about 32 years ago. She has never used smokeless tobacco. She reports that she does not  drink alcohol or use drugs.  Allergies  Allergen Reactions  . Adhesive [Tape] Other (See Comments)    Tears skin  . Latex Other (See Comments)    Blisters  . Lotrel [Amlodipine Besy-Benazepril Hcl] Itching and Cough    Family History  Problem Relation Age of Onset  . Diabetes Mother   . Diabetes Father   . Diabetes Sister   . Diabetes Brother   . Heart disease Neg Hx   . Stroke Neg Hx   . Cancer Neg Hx      Prior to Admission medications   Medication Sig Start Date End Date Taking? Authorizing Provider  acetaminophen (TYLENOL) 500 MG tablet Take 500 mg by mouth every 8 (eight) hours as needed (for pain).     Historical Provider, MD  ARIPiprazole (ABILIFY) 5 MG tablet Take 5 mg by mouth every evening.     Historical Provider, MD  atorvastatin (LIPITOR) 80 MG tablet Take 80 mg by mouth daily at 6 PM.   06/29/16   Historical Provider, MD  cloNIDine (CATAPRES) 0.2 MG tablet Take 0.2 mg by mouth 2 (two) times daily. 08/12/16   Historical Provider, MD  COMBIVENT RESPIMAT 20-100 MCG/ACT AERS respimat Inhale 1 puff into the lungs every 6 (six) hours as needed for wheezing or shortness of breath. For shortness of breath. 06/20/16   Historical Provider, MD  dabigatran (PRADAXA) 150 MG CAPS Take 150 mg by mouth every other day. Stopped prior to procedure 11-16-16    Historical Provider, MD  diclofenac sodium (VOLTAREN) 1 % GEL Apply 2 g topically 4 (four) times daily as needed. For pain. 07/18/16   Historical Provider, MD  diltiazem (CARDIZEM CD) 240 MG 24 hr capsule Take 240 mg by mouth daily. 06/12/16   Historical Provider, MD  escitalopram (LEXAPRO) 10 MG tablet Take 10 mg by mouth daily.    Historical Provider, MD  ferrous sulfate 325 (65 FE) MG tablet Take 1 tablet (325 mg total) by mouth 2 (two) times daily with a meal. 12/14/16   Lanae Crumbly, PA-C  folic acid (FOLVITE) 1 MG tablet Take 1 mg by mouth every evening.     Historical Provider, MD  furosemide (LASIX) 40 MG tablet Take 40 mg by mouth daily. 06/29/16   Historical Provider, MD  gabapentin (NEURONTIN) 100 MG capsule Take 100 mg by mouth 2 (two) times daily.    Historical Provider, MD  HYDROcodone-acetaminophen (NORCO/VICODIN) 5-325 MG tablet Take 1 tablet by mouth every 6 (six) hours as needed for moderate pain.    Historical Provider, MD  hydroxychloroquine (PLAQUENIL) 200 MG tablet Take 200 mg by mouth 2 (two) times daily.  07/26/16   Historical Provider, MD  hydroxypropyl methylcellulose / hypromellose (ISOPTO TEARS / GONIOVISC) 2.5 % ophthalmic solution Place 1-2 drops into both eyes 3 (three) times daily as needed for dry eyes.    Historical Provider, MD  MYRBETRIQ 50 MG TB24 tablet Take 50 mg by mouth daily. 06/20/16   Historical Provider, MD  potassium chloride SA (K-DUR,KLOR-CON) 20 MEQ tablet Take 20 mEq by mouth daily.    Historical Provider,  MD  pramipexole (MIRAPEX) 0.5 MG tablet Take 0.5 mg by mouth at bedtime.    Historical Provider, MD  pregabalin (LYRICA) 50 MG capsule Take 50 mg by mouth 2 (two) times daily.    Historical Provider, MD  RABEprazole (ACIPHEX) 20 MG tablet Take 20 mg by mouth daily. 06/27/16   Historical Provider, MD  ranitidine (ZANTAC) 300 MG tablet Take  300 mg by mouth daily at 2 PM. 07/12/16   Historical Provider, MD  Vitamin D, Ergocalciferol, (DRISDOL) 50000 units CAPS capsule Take 50,000 Units by mouth every 30 (thirty) days. 07/12/16   Historical Provider, MD   Physical Exam: Vitals:   12/21/16 1600 12/21/16 1621 12/21/16 1700 12/21/16 1802  BP: 180/63 176/95 181/63 (!) 185/84  Pulse: 93 88 90 89  Resp: 23 24 18 17   Temp:    99.2 F (37.3 C)  TempSrc:    Oral  SpO2: 96% 97% 97% 98%  Weight:    70.8 kg (156 lb 1.4 oz)  Height:    5' (1.524 m)    Wt Readings from Last 3 Encounters:  12/21/16 70.8 kg (156 lb 1.4 oz)  12/20/16 72.6 kg (160 lb)  12/07/16 73.4 kg (161 lb 13.1 oz)    General:  Appears calm and comfortable, Alert and oriented 3, frail appearance Eyes:  PERRL, EOMI, normal lids, iris ENT:  grossly normal hearing, lips & tongue, poor dentition Neck:  no LAD, masses or thyromegaly Cardiovascular:  RRR, no m/r/g. No LE edema.  Respiratory:  BREATH sounds, no wheezes no work of breathing Abdomen:  soft, ntnd Skin:  no rash or induration seen on limited exam Musculoskeletal:  grossly normal tone BUE/BLE Psychiatric:  grossly normal mood and affect, speech fluent and appropriate Neurologic:  CN 2-12 grossly intact, moves all extremities in coordinated fashion.          Labs on Admission:  Basic Metabolic Panel:  Recent Labs Lab 12/21/16 1154  NA 135  K 3.6  CL 95*  CO2 20*  GLUCOSE 157*  BUN 23*  CREATININE 1.81*  CALCIUM 9.4   Liver Function Tests:  Recent Labs Lab 12/21/16 1154  AST 22  ALT 14  ALKPHOS 71  BILITOT 1.0  PROT 8.1  ALBUMIN 2.8*    Recent  Labs Lab 12/21/16 1154  LIPASE 13   No results for input(s): AMMONIA in the last 168 hours. CBC:  Recent Labs Lab 12/21/16 1154  WBC 27.0*  HGB 9.9*  HCT 30.0*  MCV 83.6  PLT 730*   Cardiac Enzymes: No results for input(s): CKTOTAL, CKMB, CKMBINDEX, TROPONINI in the last 168 hours.  BNP (last 3 results) No results for input(s): BNP in the last 8760 hours.  ProBNP (last 3 results) No results for input(s): PROBNP in the last 8760 hours.   Serum creatinine: 1.81 mg/dL High 12/21/16 1154 Estimated creatinine clearance: 23.2 mL/min  CBG: No results for input(s): GLUCAP in the last 168 hours.  Radiological Exams on Admission: Ct Abdomen Pelvis Wo Contrast  Result Date: 12/21/2016 CLINICAL DATA:  Acute mid abdominal pain. EXAM: CT ABDOMEN AND PELVIS WITHOUT CONTRAST TECHNIQUE: Multidetector CT imaging of the abdomen and pelvis was performed following the standard protocol without IV contrast. COMPARISON:  CT scan of September 18, 2014. FINDINGS: Lower chest: Mild bilateral posterior basilar subsegmental atelectasis is noted. Hepatobiliary: No focal liver abnormality is seen. No gallstones, gallbladder wall thickening, or biliary dilatation. Pancreas: Unremarkable. No pancreatic ductal dilatation or surrounding inflammatory changes. Spleen: Normal in size without focal abnormality. Adrenals/Urinary Tract: Adrenal glands appear normal. No hydronephrosis or renal obstruction is noted. No renal or ureteral calculi are noted. Urinary bladder is decompressed secondary to Foley catheter. Stomach/Bowel: Large amount of stool is noted throughout the colon and rectum concerning for impaction. No small bowel dilatation is noted. Vascular/Lymphatic: Aortic atherosclerosis. No enlarged abdominal or pelvic lymph nodes. Reproductive: Status post hysterectomy. No adnexal  masses. Other: No abdominal wall hernia or abnormality. No abdominopelvic ascites. Musculoskeletal: Status post right hip arthroplasty.  Status post surgical fusion of lower lumbar spine. IMPRESSION: Aortic atherosclerosis. Large amount of stool seen throughout the colon and rectum concerning for rectal impaction. No other significant abnormality seen in the abdomen or pelvis. Electronically Signed   By: Marijo Conception, M.D.   On: 12/21/2016 14:56   Dg Chest Portable 1 View  Result Date: 12/21/2016 CLINICAL DATA:  Cough, vomiting and abdominal pain. EXAM: PORTABLE CHEST 1 VIEW COMPARISON:  12/10/2016 FINDINGS: The heart is normal in size. The mediastinal and hilar contours are stable. Mild tortuosity of the thoracic aorta. The lungs are clear. No pleural effusion. The right IJ catheter has been removed. The bony thorax is intact. IMPRESSION: No acute cardiopulmonary findings. Electronically Signed   By: Marijo Sanes M.D.   On: 12/21/2016 12:21    EKG: Pending  Assessment/Plan Active Problems:   Elevated WBC count  High WBC Patient with elevated white blood cell count almost 30 Order blood cultures Start patient on zosyn due to her history of UTIs and recent outpatient treatment for bronchitis at her nursing facility CT shows atelectasis but this could be early pneumonia which was explained the high white count  Hematemesis Patient received Protonix in the ED and this will be continued Occult stool ordered UA negative for blood We'll check a follow-up hemoglobin Likely patient's vomiting is either due to pneumonia or UTI patient's hemoglobin seems to be above baseline and she is hemodynamically stable and does not appear to have an acute bleed  Dysrhythmia Cont cardizem Cont lasix Cont pradaxa  GERD Cont PPI/H2 blocker  Mood Cont lexapro and abilify  HTN Cont clonidine  Large fecal load Cleanout rectum WITH combination of suppositories and enemas Consider daily MiraLAX afterwards Holding constipating meds  Hyperlipidemia Continue statin  Cont other meds Cont plaquenil Cont mirapex   Code Status:  Full code DVT Prophylaxis: SCDs Family Communication: Daughter-in-law bedside Disposition Plan: Pending Improvement  Status: TELEMETRY OBS  Elwin Mocha, MD Family Medicine [facility] [email or web address] Password [email or web address]

## 2016-12-22 ENCOUNTER — Observation Stay (HOSPITAL_COMMUNITY): Payer: Medicare Other

## 2016-12-22 DIAGNOSIS — A419 Sepsis, unspecified organism: Secondary | ICD-10-CM | POA: Diagnosis present

## 2016-12-22 DIAGNOSIS — D72829 Elevated white blood cell count, unspecified: Secondary | ICD-10-CM | POA: Diagnosis not present

## 2016-12-22 DIAGNOSIS — N179 Acute kidney failure, unspecified: Secondary | ICD-10-CM | POA: Diagnosis present

## 2016-12-22 DIAGNOSIS — Y846 Urinary catheterization as the cause of abnormal reaction of the patient, or of later complication, without mention of misadventure at the time of the procedure: Secondary | ICD-10-CM | POA: Diagnosis present

## 2016-12-22 DIAGNOSIS — G4733 Obstructive sleep apnea (adult) (pediatric): Secondary | ICD-10-CM | POA: Diagnosis present

## 2016-12-22 DIAGNOSIS — T83511A Infection and inflammatory reaction due to indwelling urethral catheter, initial encounter: Secondary | ICD-10-CM | POA: Diagnosis present

## 2016-12-22 DIAGNOSIS — M069 Rheumatoid arthritis, unspecified: Secondary | ICD-10-CM | POA: Diagnosis present

## 2016-12-22 DIAGNOSIS — I129 Hypertensive chronic kidney disease with stage 1 through stage 4 chronic kidney disease, or unspecified chronic kidney disease: Secondary | ICD-10-CM | POA: Diagnosis present

## 2016-12-22 DIAGNOSIS — M797 Fibromyalgia: Secondary | ICD-10-CM | POA: Diagnosis present

## 2016-12-22 DIAGNOSIS — N390 Urinary tract infection, site not specified: Secondary | ICD-10-CM | POA: Diagnosis present

## 2016-12-22 DIAGNOSIS — K920 Hematemesis: Secondary | ICD-10-CM

## 2016-12-22 DIAGNOSIS — F418 Other specified anxiety disorders: Secondary | ICD-10-CM | POA: Diagnosis not present

## 2016-12-22 DIAGNOSIS — I10 Essential (primary) hypertension: Secondary | ICD-10-CM | POA: Diagnosis not present

## 2016-12-22 DIAGNOSIS — R400 Somnolence: Secondary | ICD-10-CM | POA: Diagnosis not present

## 2016-12-22 DIAGNOSIS — R4182 Altered mental status, unspecified: Secondary | ICD-10-CM | POA: Diagnosis not present

## 2016-12-22 DIAGNOSIS — I480 Paroxysmal atrial fibrillation: Secondary | ICD-10-CM | POA: Diagnosis present

## 2016-12-22 DIAGNOSIS — D72820 Lymphocytosis (symptomatic): Secondary | ICD-10-CM | POA: Diagnosis not present

## 2016-12-22 DIAGNOSIS — R1114 Bilious vomiting: Secondary | ICD-10-CM | POA: Diagnosis not present

## 2016-12-22 DIAGNOSIS — Z6830 Body mass index (BMI) 30.0-30.9, adult: Secondary | ICD-10-CM | POA: Diagnosis not present

## 2016-12-22 DIAGNOSIS — N170 Acute kidney failure with tubular necrosis: Secondary | ICD-10-CM | POA: Diagnosis not present

## 2016-12-22 DIAGNOSIS — J9811 Atelectasis: Secondary | ICD-10-CM | POA: Diagnosis present

## 2016-12-22 DIAGNOSIS — N189 Chronic kidney disease, unspecified: Secondary | ICD-10-CM | POA: Diagnosis present

## 2016-12-22 DIAGNOSIS — E876 Hypokalemia: Secondary | ICD-10-CM | POA: Diagnosis present

## 2016-12-22 DIAGNOSIS — D72825 Bandemia: Secondary | ICD-10-CM | POA: Diagnosis not present

## 2016-12-22 DIAGNOSIS — I471 Supraventricular tachycardia: Secondary | ICD-10-CM | POA: Diagnosis present

## 2016-12-22 DIAGNOSIS — R000 Tachycardia, unspecified: Secondary | ICD-10-CM

## 2016-12-22 DIAGNOSIS — E785 Hyperlipidemia, unspecified: Secondary | ICD-10-CM | POA: Diagnosis present

## 2016-12-22 DIAGNOSIS — R935 Abnormal findings on diagnostic imaging of other abdominal regions, including retroperitoneum: Secondary | ICD-10-CM | POA: Diagnosis not present

## 2016-12-22 DIAGNOSIS — K219 Gastro-esophageal reflux disease without esophagitis: Secondary | ICD-10-CM | POA: Diagnosis present

## 2016-12-22 DIAGNOSIS — K5900 Constipation, unspecified: Secondary | ICD-10-CM | POA: Diagnosis not present

## 2016-12-22 DIAGNOSIS — E1122 Type 2 diabetes mellitus with diabetic chronic kidney disease: Secondary | ICD-10-CM | POA: Diagnosis present

## 2016-12-22 DIAGNOSIS — R112 Nausea with vomiting, unspecified: Secondary | ICD-10-CM | POA: Diagnosis not present

## 2016-12-22 DIAGNOSIS — D72823 Leukemoid reaction: Secondary | ICD-10-CM | POA: Diagnosis not present

## 2016-12-22 DIAGNOSIS — Z96641 Presence of right artificial hip joint: Secondary | ICD-10-CM | POA: Diagnosis present

## 2016-12-22 DIAGNOSIS — D649 Anemia, unspecified: Secondary | ICD-10-CM | POA: Diagnosis not present

## 2016-12-22 DIAGNOSIS — I2782 Chronic pulmonary embolism: Secondary | ICD-10-CM | POA: Diagnosis not present

## 2016-12-22 DIAGNOSIS — N319 Neuromuscular dysfunction of bladder, unspecified: Secondary | ICD-10-CM | POA: Diagnosis present

## 2016-12-22 DIAGNOSIS — E43 Unspecified severe protein-calorie malnutrition: Secondary | ICD-10-CM | POA: Diagnosis present

## 2016-12-22 DIAGNOSIS — Z96651 Presence of right artificial knee joint: Secondary | ICD-10-CM | POA: Diagnosis present

## 2016-12-22 LAB — URINALYSIS, ROUTINE W REFLEX MICROSCOPIC
Bilirubin Urine: NEGATIVE
GLUCOSE, UA: 50 mg/dL — AB
KETONES UR: 20 mg/dL — AB
NITRITE: POSITIVE — AB
PROTEIN: 100 mg/dL — AB
Specific Gravity, Urine: 1.014 (ref 1.005–1.030)
pH: 7 (ref 5.0–8.0)

## 2016-12-22 LAB — BASIC METABOLIC PANEL
Anion gap: 15 (ref 5–15)
BUN: 17 mg/dL (ref 6–20)
CO2: 19 mmol/L — ABNORMAL LOW (ref 22–32)
CREATININE: 1.45 mg/dL — AB (ref 0.44–1.00)
Calcium: 8.3 mg/dL — ABNORMAL LOW (ref 8.9–10.3)
Chloride: 102 mmol/L (ref 101–111)
GFR calc Af Amer: 39 mL/min — ABNORMAL LOW (ref >60)
GFR, EST NON AFRICAN AMERICAN: 34 mL/min — AB (ref >60)
GLUCOSE: 152 mg/dL — AB (ref 65–99)
POTASSIUM: 3.2 mmol/L — AB (ref 3.5–5.1)
SODIUM: 136 mmol/L (ref 135–145)

## 2016-12-22 LAB — GLUCOSE, CAPILLARY: GLUCOSE-CAPILLARY: 143 mg/dL — AB (ref 65–99)

## 2016-12-22 LAB — BLOOD GAS, ARTERIAL
ACID-BASE DEFICIT: 0.5 mmol/L (ref 0.0–2.0)
Bicarbonate: 22.5 mmol/L (ref 20.0–28.0)
DRAWN BY: 277881
O2 Saturation: 93.4 %
PCO2 ART: 29.8 mmHg — AB (ref 32.0–48.0)
PH ART: 7.491 — AB (ref 7.350–7.450)
Patient temperature: 98.6
pO2, Arterial: 68.7 mmHg — ABNORMAL LOW (ref 83.0–108.0)

## 2016-12-22 LAB — MRSA PCR SCREENING: MRSA BY PCR: NEGATIVE

## 2016-12-22 LAB — D-DIMER, QUANTITATIVE: D-Dimer, Quant: 4.98 ug/mL-FEU — ABNORMAL HIGH (ref 0.00–0.50)

## 2016-12-22 LAB — MAGNESIUM: Magnesium: 2.2 mg/dL (ref 1.7–2.4)

## 2016-12-22 LAB — CBC
HEMATOCRIT: 28.9 % — AB (ref 36.0–46.0)
Hemoglobin: 9.7 g/dL — ABNORMAL LOW (ref 12.0–15.0)
MCH: 28 pg (ref 26.0–34.0)
MCHC: 33.6 g/dL (ref 30.0–36.0)
MCV: 83.3 fL (ref 78.0–100.0)
PLATELETS: 692 10*3/uL — AB (ref 150–400)
RBC: 3.47 MIL/uL — ABNORMAL LOW (ref 3.87–5.11)
RDW: 15 % (ref 11.5–15.5)
WBC: 36.6 10*3/uL — AB (ref 4.0–10.5)

## 2016-12-22 LAB — OCCULT BLOOD X 1 CARD TO LAB, STOOL: Fecal Occult Bld: POSITIVE — AB

## 2016-12-22 LAB — TROPONIN I
TROPONIN I: 0.31 ng/mL — AB (ref <0.03)
Troponin I: 0.29 ng/mL (ref <0.03)
Troponin I: 0.36 ng/mL (ref <0.03)

## 2016-12-22 LAB — LACTIC ACID, PLASMA: LACTIC ACID, VENOUS: 1.1 mmol/L (ref 0.5–1.9)

## 2016-12-22 LAB — TSH: TSH: 1.224 u[IU]/mL (ref 0.350–4.500)

## 2016-12-22 MED ORDER — NALOXONE HCL 0.4 MG/ML IJ SOLN
INTRAMUSCULAR | Status: AC
  Filled 2016-12-22: qty 1

## 2016-12-22 MED ORDER — DEXTROSE 5 % IV SOLN
1.0000 g | INTRAVENOUS | Status: DC
  Administered 2016-12-22 – 2016-12-25 (×4): 1 g via INTRAVENOUS
  Filled 2016-12-22 (×7): qty 1

## 2016-12-22 MED ORDER — MAGNESIUM SULFATE 2 GM/50ML IV SOLN
2.0000 g | Freq: Once | INTRAVENOUS | Status: AC
  Administered 2016-12-22: 2 g via INTRAVENOUS
  Filled 2016-12-22: qty 50

## 2016-12-22 MED ORDER — ENSURE ENLIVE PO LIQD: 237.0000 mL | Freq: Two times a day (BID) | ORAL | Status: DC

## 2016-12-22 MED ORDER — POTASSIUM CHLORIDE IN NACL 40-0.9 MEQ/L-% IV SOLN
INTRAVENOUS | Status: DC
  Administered 2016-12-22: 75 mL/h via INTRAVENOUS
  Filled 2016-12-22 (×4): qty 1000

## 2016-12-22 MED ORDER — DILTIAZEM HCL 100 MG IV SOLR
5.0000 mg/h | INTRAVENOUS | Status: DC
  Filled 2016-12-22: qty 100

## 2016-12-22 MED ORDER — SODIUM CHLORIDE 0.9 % IV SOLN
1250.0000 mg | Freq: Once | INTRAVENOUS | Status: AC
  Administered 2016-12-22: 1250 mg via INTRAVENOUS
  Filled 2016-12-22 (×2): qty 1250

## 2016-12-22 MED ORDER — VANCOMYCIN HCL IN DEXTROSE 750-5 MG/150ML-% IV SOLN
750.0000 mg | INTRAVENOUS | Status: DC
  Administered 2016-12-23 – 2016-12-24 (×2): 750 mg via INTRAVENOUS
  Filled 2016-12-22 (×2): qty 150

## 2016-12-22 MED ORDER — SODIUM CHLORIDE 0.9 % IV BOLUS (SEPSIS): 250.0000 mL | Freq: Once | INTRAVENOUS | Status: DC

## 2016-12-22 MED ORDER — POTASSIUM CHLORIDE CRYS ER 20 MEQ PO TBCR
20.0000 meq | EXTENDED_RELEASE_TABLET | Freq: Two times a day (BID) | ORAL | Status: AC
  Administered 2016-12-22 (×2): 20 meq via ORAL
  Filled 2016-12-22: qty 1

## 2016-12-22 MED ORDER — NALOXONE HCL 0.4 MG/ML IJ SOLN
0.4000 mg | Freq: Once | INTRAMUSCULAR | Status: AC
  Administered 2016-12-22: 0.4 mg via INTRAVENOUS

## 2016-12-22 MED ORDER — METOPROLOL TARTRATE 5 MG/5ML IV SOLN
5.0000 mg | Freq: Once | INTRAVENOUS | Status: AC
  Administered 2016-12-22: 5 mg via INTRAVENOUS
  Filled 2016-12-22: qty 5

## 2016-12-22 NOTE — Progress Notes (Signed)
**  Preliminary report by tech**  Bilateral lower extremity venous duplex completed. There is no evidence of deep or superficial vein thrombosis involving the right and left lower extremities. All visualized vessels appear patent and compressible. There is no evidence of Baker's cysts bilaterally. Results given to the patient's nurse, Sarah.  12/22/16 3:30 PM Carlos Levering RVT

## 2016-12-22 NOTE — Progress Notes (Signed)
Paged MD regarding HR sustaining between 120-160. Pt is finally asleep, resting, after vomiting all night. Awaiting response.   Eleanora Neighbor, RN

## 2016-12-22 NOTE — Plan of Care (Signed)
Problem: Physical Regulation: Goal: Ability to maintain clinical measurements within normal limits will improve Outcome: Not Progressing Late entry. 1310. Patient transferred to stepdown d/t increased heart rate 140s-150s, and increased sbp 180. MD at bedside. Metoprolol given IV. -Orders initiated. Report called to Sarah. Patient transferred to [id number] via bed on monitor with RN and NT. Bartholomew Crews, RN

## 2016-12-22 NOTE — Progress Notes (Addendum)
[facility] PROGRESS NOTE  Gloria Best FVC:[id number] DOB: [birth year] DOA: 12/21/2016   PCP: Marco Collie, MD     Assessment/Plan: Active Problems:   Elevated WBC count   Hematemesis with nausea  Gloria Best is a 76 y.o. female  past mental history significant for of anemia, anxiety, asthma, bladder disorder, recurrent UTIs, chronic kidney disease, diabetes MR Roger, reflux, hypertension, pneumonia, sleep apnea presents to emergency room with chief complaint hematemesis from her nursing facility. Per report from EDP patient was sent over for multiple episodes of emesis earlier today. One sample was checked and was positive for blood.  . EDP recommended consulting GI for upper GI bleed. Patient has had multiple normal bowel movements in the emergency room. CT of the abdomen and pelvis showed significant constipation.      Assessment and plan Sepsis, likely secondary to urinary source White blood cell count significantly elevated Follow blood culture 2 Discontinue Foley CT abdomen pelvis showed large amount of stool Chest x-ray negative GI panel to rule out gastroenteritis as the patient has had nausea with vomiting 2 days   Hematemesis, patient denies, hemoglobin stable, baseline between 9-10 continue Protonix, check FOBT UA negative for blood Continue Pradaxa for now for DVT prophylaxis  SVT, in the setting of sepsis Given 1 dose of IV metoprolol, continue to cycle cardiac enzymes 2-D echo pending, it remains uncontrolled, will need cardiology evaluation Continue Cardizem Continue Pradaxa In the setting of elevated d-dimer, will check bilateral lower extremities to rule out DVT Low threshold to rule out PE  Hypertension Cont cardizem Hold Lasix, hydrating with IV fluids   Diabetes Most recent hemoglobin A1c was 6 Continue current regimen  Hypokalemia-replete  GERD Cont PPI/H2 blocker  Anxiety/depression Cont lexapro and  abilify  HTN Hypertensive continue current meds    Constipation, 6 bowel movements since admission overnight Cleanout rectum WITH combination of suppositories and enemas Consider daily MiraLAX afterwards Holding constipating meds  Hyperlipidemia Continue statin   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  Does not appear to be have a septic joint    DVT prophylaxsis Pradaxa  Code Status:  Full code     Family Communication: Discussed in detail with the patient, all imaging results, lab results explained to the patient   Disposition Plan:  May need to transfer to stepdown     Consultants:  None  Procedures:  None  Antibiotics: Anti-infectives    Start     Dose/Rate Route Frequency Ordered Stop   12/21/16 2315  hydroxychloroquine (PLAQUENIL) tablet 200 mg  Status:  Discontinued     200 mg Oral 2 times daily 12/21/16 1857 12/22/16 1036   12/21/16 2300  piperacillin-tazobactam (ZOSYN) IVPB 3.375 g     3.375 g 12.5 mL/hr over 240 Minutes Intravenous Every 8 hours 12/21/16 1638     12/21/16 1645  piperacillin-tazobactam (ZOSYN) IVPB 3.375 g  Status:  Discontinued     3.375 g 100 mL/hr over 30 Minutes Intravenous Every 8 hours 12/21/16 1630 12/21/16 1636   12/21/16 1645  piperacillin-tazobactam (ZOSYN) IVPB 3.375 g     3.375 g 100 mL/hr over 30 Minutes Intravenous  Once 12/21/16 1638 12/21/16 1723         HPI/Subjective: Patient found to be tachycardic and hypertensive overnight, EKG requested this morning but not completed  Objective: Vitals:   12/22/16 0124 12/22/16 0516 12/22/16 0748 12/22/16 1032  BP:  (!) 183/63 (!) 171/105 (!) 182/104  Pulse:  Marland Kitchen)  112 (!) 157 (!) 164  Resp:  20 (!) 24 (!) 22  Temp:  99.4 F (37.4 C) 98.7 F (37.1 C) 98.8 F (37.1 C)  TempSrc:  Oral Oral Oral  SpO2:  98% 96% 97%  Weight: 70.7 kg (155 lb 13.8 oz)     Height:        Intake/Output Summary (Last 24 hours) at 12/22/16 1037 Last data filed at 12/22/16  1005  Gross per 24 hour  Intake          1541.67 ml  Output             2500 ml  Net          -958.33 ml    Exam:  Examination:  General exam: Appears calm and comfortable  Respiratory system: Clear to auscultation. Respiratory effort normal. Cardiovascular system: S1 & S2 heard, RRR. No JVD, murmurs, rubs, gallops or clicks. No pedal edema. Gastrointestinal system: Abdomen is nondistended, soft and nontender. No organomegaly or masses felt. Normal bowel sounds heard. Central nervous system: Alert and oriented. No focal neurological deficits. Extremities: Symmetric 5 x 5 power. Skin: No rashes, lesions or ulcers Psychiatry: Judgement and insight appear normal. Mood & affect appropriate.     Data Reviewed: I have personally reviewed following labs and imaging studies  Micro Results Recent Results (from the past 240 hour(s))  MRSA PCR Screening     Status: None   Collection Time: 12/21/16  7:01 PM  Result Value Ref Range Status   MRSA by PCR NEGATIVE NEGATIVE Final    Comment:        The GeneXpert MRSA Assay (FDA approved for NASAL specimens only), is one component of a comprehensive MRSA colonization surveillance program. It is not intended to diagnose MRSA infection nor to guide or monitor treatment for MRSA infections.     Radiology Reports Ct Abdomen Pelvis Wo Contrast  Result Date: 12/21/2016 CLINICAL DATA:  Acute mid abdominal pain. EXAM: CT ABDOMEN AND PELVIS WITHOUT CONTRAST TECHNIQUE: Multidetector CT imaging of the abdomen and pelvis was performed following the standard protocol without IV contrast. COMPARISON:  CT scan of September 18, 2014. FINDINGS: Lower chest: Mild bilateral posterior basilar subsegmental atelectasis is noted. Hepatobiliary: No focal liver abnormality is seen. No gallstones, gallbladder wall thickening, or biliary dilatation. Pancreas: Unremarkable. No pancreatic ductal dilatation or surrounding inflammatory changes. Spleen: Normal in size  without focal abnormality. Adrenals/Urinary Tract: Adrenal glands appear normal. No hydronephrosis or renal obstruction is noted. No renal or ureteral calculi are noted. Urinary bladder is decompressed secondary to Foley catheter. Stomach/Bowel: Large amount of stool is noted throughout the colon and rectum concerning for impaction. No small bowel dilatation is noted. Vascular/Lymphatic: Aortic atherosclerosis. No enlarged abdominal or pelvic lymph nodes. Reproductive: Status post hysterectomy. No adnexal masses. Other: No abdominal wall hernia or abnormality. No abdominopelvic ascites. Musculoskeletal: Status post right hip arthroplasty. Status post surgical fusion of lower lumbar spine. IMPRESSION: Aortic atherosclerosis. Large amount of stool seen throughout the colon and rectum concerning for rectal impaction. No other significant abnormality seen in the abdomen or pelvis. Electronically Signed   By: Marijo Conception, M.D.   On: 12/21/2016 14:56   Portable Chest 1 View  Result Date: 12/22/2016 CLINICAL DATA:  Elevated white blood cell count. No shortness of breath. History of diabetes and hypertension. EXAM: PORTABLE CHEST 1 VIEW COMPARISON:  12/21/2016. FINDINGS: Cardiac silhouette is normal in size. No mediastinal or hilar masses. No convincing adenopathy. Mild increased bronchovascular markings  are noted bilaterally most evident at the medial lung bases. There is likely a component of atelectasis at the medial lung bases. No convincing pneumonia. No pulmonary edema. No pleural effusion or pneumothorax. Skeletal structures are intact. IMPRESSION: No acute cardiopulmonary disease. Electronically Signed   By: Lajean Manes M.D.   On: 12/22/2016 07:46   Dg Chest Portable 1 View  Result Date: 12/21/2016 CLINICAL DATA:  Cough, vomiting and abdominal pain. EXAM: PORTABLE CHEST 1 VIEW COMPARISON:  12/10/2016 FINDINGS: The heart is normal in size. The mediastinal and hilar contours are stable. Mild tortuosity  of the thoracic aorta. The lungs are clear. No pleural effusion. The right IJ catheter has been removed. The bony thorax is intact. IMPRESSION: No acute cardiopulmonary findings. Electronically Signed   By: Marijo Sanes M.D.   On: 12/21/2016 12:21   Dg Chest Port 1 View  Result Date: 12/10/2016 CLINICAL DATA:  Status post central venous catheter placement prior to knee surgery. EXAM: PORTABLE CHEST 1 VIEW COMPARISON:  PA and lateral chest x-ray of September 10, 2016 FINDINGS: The lungs are well-expanded. There is no pneumothorax or pleural effusion. The interstitial markings are mildly prominent though stable. The cardiac silhouette is top-normal in size. The pulmonary vascularity is normal. There is calcification in the wall of the aortic arch. The right internal jugular venous catheter tip projects over the junction of the middle and distal thirds of the SVC. The observed bony thorax is unremarkable. IMPRESSION: There is no immediate postprocedure complication following placement of the right internal jugular venous catheter. The tip of the catheter projects at the junction of the middle and distal SVC. Mild chronic bronchitic changes. Thoracic aortic atherosclerosis. Electronically Signed   By: David  Martinique M.D.   On: 12/10/2016 11:39   Dg Abd Portable 1v  Result Date: 12/22/2016 CLINICAL DATA:  Last normal bowel movement 2 weeks ago. Constipation. EXAM: PORTABLE ABDOMEN - 1 VIEW COMPARISON:  CT, 12/21/2016. FINDINGS: There is no cough dilation to suggest obstruction or significant adynamic ileus. There has been an interval decrease in stool in the rectum and left colon. There is still mild increased colonic stool in the right colon. No evidence of renal or ureteral stones. Soft tissues are unremarkable. Stable changes from previous lumbar spine fusion and right hip replacement. IMPRESSION: 1. There has been a decrease in stool in the rectum and left colon since the previous day's CT scan with persistent  increased stool noted in the right colon. 2. No acute findings.  No evidence of bowel obstruction. Electronically Signed   By: Lajean Manes M.D.   On: 12/22/2016 07:45     CBC  Recent Labs Lab 12/21/16 1154 12/22/16 0806  WBC 27.0* 36.6*  HGB 9.9* 9.7*  HCT 30.0* 28.9*  PLT 730* 692*  MCV 83.6 83.3  MCH 27.6 28.0  MCHC 33.0 33.6  RDW 14.7 15.0    Chemistries   Recent Labs Lab 12/21/16 1154 12/22/16 0806  NA 135 136  K 3.6 3.2*  CL 95* 102  CO2 20* 19*  GLUCOSE 157* 152*  BUN 23* 17  CREATININE 1.81* 1.45*  CALCIUM 9.4 8.3*  AST 22  --   ALT 14  --   ALKPHOS 71  --   BILITOT 1.0  --    ------------------------------------------------------------------------------------------------------------------ estimated creatinine clearance is 29 mL/min (by C-G formula based on SCr of 1.45 mg/dL (H)). ------------------------------------------------------------------------------------------------------------------ No results for input(s): HGBA1C in the last 72 hours. ------------------------------------------------------------------------------------------------------------------ No results for input(s): CHOL, HDL, LDLCALC,  TRIG, CHOLHDL, LDLDIRECT in the last 72 hours. ------------------------------------------------------------------------------------------------------------------  Recent Labs  12/22/16 0806  TSH 1.224   ------------------------------------------------------------------------------------------------------------------ No results for input(s): VITAMINB12, FOLATE, FERRITIN, TIBC, IRON, RETICCTPCT in the last 72 hours.  Coagulation profile  Recent Labs Lab 12/21/16 1154  INR 1.12     Recent Labs  12/22/16 0806  DDIMER 4.98*    Cardiac Enzymes  Recent Labs Lab 12/22/16 0806  TROPONINI 0.29*   ------------------------------------------------------------------------------------------------------------------ Invalid input(s):  POCBNP   CBG: No results for input(s): GLUCAP in the last 168 hours.     Studies: Ct Abdomen Pelvis Wo Contrast  Result Date: 12/21/2016 CLINICAL DATA:  Acute mid abdominal pain. EXAM: CT ABDOMEN AND PELVIS WITHOUT CONTRAST TECHNIQUE: Multidetector CT imaging of the abdomen and pelvis was performed following the standard protocol without IV contrast. COMPARISON:  CT scan of September 18, 2014. FINDINGS: Lower chest: Mild bilateral posterior basilar subsegmental atelectasis is noted. Hepatobiliary: No focal liver abnormality is seen. No gallstones, gallbladder wall thickening, or biliary dilatation. Pancreas: Unremarkable. No pancreatic ductal dilatation or surrounding inflammatory changes. Spleen: Normal in size without focal abnormality. Adrenals/Urinary Tract: Adrenal glands appear normal. No hydronephrosis or renal obstruction is noted. No renal or ureteral calculi are noted. Urinary bladder is decompressed secondary to Foley catheter. Stomach/Bowel: Large amount of stool is noted throughout the colon and rectum concerning for impaction. No small bowel dilatation is noted. Vascular/Lymphatic: Aortic atherosclerosis. No enlarged abdominal or pelvic lymph nodes. Reproductive: Status post hysterectomy. No adnexal masses. Other: No abdominal wall hernia or abnormality. No abdominopelvic ascites. Musculoskeletal: Status post right hip arthroplasty. Status post surgical fusion of lower lumbar spine. IMPRESSION: Aortic atherosclerosis. Large amount of stool seen throughout the colon and rectum concerning for rectal impaction. No other significant abnormality seen in the abdomen or pelvis. Electronically Signed   By: Marijo Conception, M.D.   On: 12/21/2016 14:56   Portable Chest 1 View  Result Date: 12/22/2016 CLINICAL DATA:  Elevated white blood cell count. No shortness of breath. History of diabetes and hypertension. EXAM: PORTABLE CHEST 1 VIEW COMPARISON:  12/21/2016. FINDINGS: Cardiac silhouette is  normal in size. No mediastinal or hilar masses. No convincing adenopathy. Mild increased bronchovascular markings are noted bilaterally most evident at the medial lung bases. There is likely a component of atelectasis at the medial lung bases. No convincing pneumonia. No pulmonary edema. No pleural effusion or pneumothorax. Skeletal structures are intact. IMPRESSION: No acute cardiopulmonary disease. Electronically Signed   By: Lajean Manes M.D.   On: 12/22/2016 07:46   Dg Chest Portable 1 View  Result Date: 12/21/2016 CLINICAL DATA:  Cough, vomiting and abdominal pain. EXAM: PORTABLE CHEST 1 VIEW COMPARISON:  12/10/2016 FINDINGS: The heart is normal in size. The mediastinal and hilar contours are stable. Mild tortuosity of the thoracic aorta. The lungs are clear. No pleural effusion. The right IJ catheter has been removed. The bony thorax is intact. IMPRESSION: No acute cardiopulmonary findings. Electronically Signed   By: Marijo Sanes M.D.   On: 12/21/2016 12:21   Dg Abd Portable 1v  Result Date: 12/22/2016 CLINICAL DATA:  Last normal bowel movement 2 weeks ago. Constipation. EXAM: PORTABLE ABDOMEN - 1 VIEW COMPARISON:  CT, 12/21/2016. FINDINGS: There is no cough dilation to suggest obstruction or significant adynamic ileus. There has been an interval decrease in stool in the rectum and left colon. There is still mild increased colonic stool in the right colon. No evidence of renal or ureteral stones. Soft tissues are unremarkable. Stable  changes from previous lumbar spine fusion and right hip replacement. IMPRESSION: 1. There has been a decrease in stool in the rectum and left colon since the previous day's CT scan with persistent increased stool noted in the right colon. 2. No acute findings.  No evidence of bowel obstruction. Electronically Signed   By: Lajean Manes M.D.   On: 12/22/2016 07:45      Lab Results  Component Value Date   HGBA1C 6.0 (H) 12/10/2016   HGBA1C 6.0 (H) 12/10/2016    HGBA1C 5.6 08/27/2016   Lab Results  Component Value Date   CREATININE 1.45 (H) 12/22/2016       Scheduled Meds: . ARIPiprazole  5 mg Oral QPM  . atorvastatin  80 mg Oral q1800  . cloNIDine  0.2 mg Oral BID  . dabigatran  150 mg Oral QODAY  . diltiazem  240 mg Oral Daily  . escitalopram  10 mg Oral Daily  . feeding supplement (ENSURE ENLIVE)  237 mL Oral BID BM  . furosemide  40 mg Oral Daily  . milk and molasses  1 enema Rectal Once  . pantoprazole (PROTONIX) IV  40 mg Intravenous Q12H  . piperacillin-tazobactam (ZOSYN)  IV  3.375 g Intravenous Q8H  . pramipexole  0.5 mg Oral QHS  . sodium chloride flush  3 mL Intravenous Q12H   Continuous Infusions: . 0.9 % NaCl with KCl 40 mEq / L       LOS: 0 days    Time spent: >30 MINS    [facility]  [facility] Pager [phone]. If 7PM-7AM, please contact night-coverage at [email or web address], password [facility] 12/22/2016, 10:37 AM  LOS: 0 days

## 2016-12-22 NOTE — Plan of Care (Addendum)
Problem: Physical Regulation: Goal: Ability to maintain clinical measurements within normal limits will improve Outcome: Not Progressing CRITICAL VALUE ALERT  Critical value received: troponin 0.29 Date of notification:  12/22/2016 Time of notification:  0929  Critical value read back: yes   Nurse who received alert:   Manya Silvas, RN  MD notified (1st page):  Dr. Allyson Sabal Time of first page:  0932 Received return call from Dr. Allyson Sabal [phone]. Will continue to monitor. Bartholomew Crews, RN

## 2016-12-22 NOTE — Progress Notes (Signed)
Initial Nutrition Assessment  DOCUMENTATION CODES:  Obesity unspecified, Severe malnutrition in context of acute illness/injury   Pt meets criteria for SEVERE MALNUTRITION in the context of Acute Illness as evidenced by loss of >3.5% bw x 2 weeks and an intake that was estimated to meet </= 50% of needs for >/= 5 days.  INTERVENTION:  Magic cup TID with meals, each supplement provides 290 kcal and 9 grams of protein  NUTRITION DIAGNOSIS:  Inadequate oral intake related to nausea, poor appetite as evidenced by per patient/family report.  GOAL:  Patient will meet greater than or equal to 90% of their needs  MONITOR:  PO intake, Supplement acceptance, Labs, Weight trends  REASON FOR ASSESSMENT:  Malnutrition Screening Tool    ASSESSMENT:  76 y/o female PMHx anemia, anxiety, depression asthma, CKD, GERD, HTN, DM. S/P R TKA on 2/6. Presnts from nursing facility with hematemesis. CT showed significant constipation. Worked up for sepsis and SVT  Pt was transferred to step down.   Family member at bedside. She reports that the patient's appetite never recovered after her surgery ~10 days ago. Per their report, the pt was hospitalized for a week and then went to rehab. The pt's po intake did not improve at the facility as the pt says she did not like the facilities food. Daughter quantified the pt's po intake since surgery as ~50% of her normal amount.   Pt reports nausea and was found to be significantly constipated. Daughter says the pt has had severe constipation in the past. She may stool softener on D/C. Advocated fluid consumption.   The patients appetite is still poor, mostly secondary to nausea. She report the zofran/phenergan have helped.   Wt has fallen 5-6 lbs in the past 2 weeks which is ~3.5% bw. This is a significant wt loss for the time frame.   Pt does not like Ensure/Boost. RD asked if there was one think she would eat, what would it be- she replied ice cream. She agreed  to magic cup- order with meals until diet improved. Menu is at bedside if she desires something else   NFPE: Not assessed  Medications: IV abx, Ensure, ppi, kcl, IV abx, PRN zofran/phenergan Labs: BGs in 150s, WBC: 27, Albumin: 2.8, BUN/Creat:23/1.81   Recent Labs Lab 12/21/16 1154 12/22/16 0806 12/22/16 1147  NA 135 136  --   K 3.6 3.2*  --   CL 95* 102  --   CO2 20* 19*  --   BUN 23* 17  --   CREATININE 1.81* 1.45*  --   CALCIUM 9.4 8.3*  --   MG  --   --  2.2  GLUCOSE 157* 152*  --     Diet Order:  Diet regular Room service appropriate? Yes; Fluid consistency: Thin  Skin: Surgical incision to R Knee.   Last BM:  2/17  Height:  Ht Readings from Last 1 Encounters:  12/21/16 5' (1.524 m)   Weight:  Wt Readings from Last 1 Encounters:  12/22/16 155 lb 13.8 oz (70.7 kg)   Wt Readings from Last 10 Encounters:  12/22/16 155 lb 13.8 oz (70.7 kg)  12/20/16 160 lb (72.6 kg)  12/07/16 161 lb 13.1 oz (73.4 kg)  08/27/16 166 lb 2 oz (75.4 kg)  11/12/13 169 lb (76.7 kg)   Ideal Body Weight:  45.45 kg  BMI:  Body mass index is 30.44 kg/m.  Estimated Nutritional Needs:  Kcal:  1550-1700 (22-24 kcal/kg bw)  Protein:  60-70 g (  1.3-1.5 g/kg ibw) Fluid:  Per MD  EDUCATION NEEDS:   No education needs identified at this time  Burtis Junes RD, LDN, [place] [facility] Pager: [phone] 12/22/2016 3:58 PM

## 2016-12-22 NOTE — Progress Notes (Signed)
Pharmacy Antibiotic Note  Gloria Best is a 76 y.o. female admitted on 12/21/2016 with sepsis likely secondary to a urinary source .  Pharmacy has been consulted for vanc/cefepime dosing.Patient is currently afebrile with WBC count 36.6. HR is elevated. Patient has been receiving zosyn for 1 day with last dose 2/17 0923.   Plan: Vancomycin 1250 mg X 1 dose Then Vancomycin 750 mg Q 24 hours  Cefepime 1 gm every 24 hours  Monitor renal function Monitor clinical s/sx of infection  Height: 5' (152.4 cm) Weight: 155 lb 13.8 oz (70.7 kg) IBW/kg (Calculated) : 45.5  Temp (24hrs), Avg:99.1 F (37.3 C), Min:98.7 F (37.1 C), Max:99.5 F (37.5 C)   Recent Labs Lab 12/21/16 1154 12/21/16 1631 12/21/16 1859 12/22/16 0806  WBC 27.0*  --   --  36.6*  CREATININE 1.81*  --   --  1.45*  LATICACIDVEN  --  1.2 2.0* 1.1    Estimated Creatinine Clearance: 29 mL/min (by C-G formula based on SCr of 1.45 mg/dL (H)).    Allergies  Allergen Reactions  . Adhesive [Tape] Other (See Comments)    Tears skin  . Latex Other (See Comments)    Blisters  . Lotrel [Amlodipine Besy-Benazepril Hcl] Itching and Cough    Antimicrobials this admission: Zosyn 2/16>> 2/17 Vanc 2/17>> Cefepime 2/17>>    Microbiology results: 2/16 BCx: NGTD  2/16 MRSA PCR: Neg  Thank you for allowing pharmacy to be a part of this patient's care.  Ihor Austin, PharmD PGY1 Pharmacy Resident  Pager: [phone] 12/22/2016 12:06 PM

## 2016-12-23 ENCOUNTER — Inpatient Hospital Stay (HOSPITAL_COMMUNITY): Payer: Medicare Other

## 2016-12-23 DIAGNOSIS — N179 Acute kidney failure, unspecified: Secondary | ICD-10-CM

## 2016-12-23 DIAGNOSIS — D72820 Lymphocytosis (symptomatic): Secondary | ICD-10-CM

## 2016-12-23 DIAGNOSIS — N170 Acute kidney failure with tubular necrosis: Secondary | ICD-10-CM

## 2016-12-23 DIAGNOSIS — I2782 Chronic pulmonary embolism: Secondary | ICD-10-CM

## 2016-12-23 LAB — COMPREHENSIVE METABOLIC PANEL
ALK PHOS: 65 U/L (ref 38–126)
ALT: 13 U/L — ABNORMAL LOW (ref 14–54)
ANION GAP: 12 (ref 5–15)
AST: 23 U/L (ref 15–41)
Albumin: 2.3 g/dL — ABNORMAL LOW (ref 3.5–5.0)
BUN: 19 mg/dL (ref 6–20)
CALCIUM: 8.4 mg/dL — AB (ref 8.9–10.3)
CO2: 19 mmol/L — AB (ref 22–32)
Chloride: 106 mmol/L (ref 101–111)
Creatinine, Ser: 1.42 mg/dL — ABNORMAL HIGH (ref 0.44–1.00)
GFR calc non Af Amer: 35 mL/min — ABNORMAL LOW (ref >60)
GFR, EST AFRICAN AMERICAN: 40 mL/min — AB (ref >60)
GLUCOSE: 109 mg/dL — AB (ref 65–99)
POTASSIUM: 4.1 mmol/L (ref 3.5–5.1)
SODIUM: 137 mmol/L (ref 135–145)
TOTAL PROTEIN: 6.6 g/dL (ref 6.5–8.1)
Total Bilirubin: 0.9 mg/dL (ref 0.3–1.2)

## 2016-12-23 LAB — CBC
HCT: 26.7 % — ABNORMAL LOW (ref 36.0–46.0)
HEMOGLOBIN: 8.6 g/dL — AB (ref 12.0–15.0)
MCH: 27 pg (ref 26.0–34.0)
MCHC: 32.2 g/dL (ref 30.0–36.0)
MCV: 84 fL (ref 78.0–100.0)
PLATELETS: 683 10*3/uL — AB (ref 150–400)
RBC: 3.18 MIL/uL — AB (ref 3.87–5.11)
RDW: 15.4 % (ref 11.5–15.5)
WBC: 34.5 10*3/uL — AB (ref 4.0–10.5)

## 2016-12-23 LAB — GASTROINTESTINAL PANEL BY PCR, STOOL (REPLACES STOOL CULTURE)

## 2016-12-23 LAB — DIFFERENTIAL
BASOS ABS: 0 10*3/uL (ref 0.0–0.1)
Basophils Relative: 0 %
Eosinophils Absolute: 0 10*3/uL (ref 0.0–0.7)
Eosinophils Relative: 0 %
LYMPHS ABS: 3.1 10*3/uL (ref 0.7–4.0)
Lymphocytes Relative: 9 %
MONO ABS: 2.8 10*3/uL — AB (ref 0.1–1.0)
MONOS PCT: 8 %
Neutro Abs: 28.6 10*3/uL — ABNORMAL HIGH (ref 1.7–7.7)
Neutrophils Relative %: 83 %

## 2016-12-23 MED ORDER — DILTIAZEM HCL ER COATED BEADS 240 MG PO CP24
240.0000 mg | ORAL_CAPSULE | Freq: Every day | ORAL | Status: DC
  Administered 2016-12-23 – 2016-12-27 (×5): 240 mg via ORAL
  Filled 2016-12-23 (×6): qty 1

## 2016-12-23 MED ORDER — HYDROCODONE-ACETAMINOPHEN 5-325 MG PO TABS
1.0000 | ORAL_TABLET | Freq: Four times a day (QID) | ORAL | Status: DC | PRN
  Administered 2016-12-23 – 2016-12-27 (×6): 1 via ORAL
  Filled 2016-12-23 (×6): qty 1

## 2016-12-23 MED ORDER — PANTOPRAZOLE SODIUM 40 MG PO TBEC
40.0000 mg | DELAYED_RELEASE_TABLET | Freq: Every day | ORAL | Status: DC
  Administered 2016-12-23 – 2016-12-25 (×3): 40 mg via ORAL
  Filled 2016-12-23 (×3): qty 1

## 2016-12-23 MED ORDER — HYDROCODONE-ACETAMINOPHEN 5-325 MG PO TABS
1.0000 | ORAL_TABLET | Freq: Once | ORAL | Status: AC
  Administered 2016-12-23: 1 via ORAL
  Filled 2016-12-23: qty 1

## 2016-12-23 NOTE — Progress Notes (Signed)
MD notified about the need for diltiazem drip that was not started earlier due SBP parameters per previous RN.He is aware that patient converted to NSR around 6pm. Diltiazem PO order to start in the morning. Patient requested pain med for knee and one time dose ordered I will ask MD if they want to start home meds back for in the morning including regular PRN pain med.

## 2016-12-23 NOTE — Progress Notes (Signed)
Pt failed to urinate on her own and felt pressure. Bladder scanned showed 420 mLs. MD paged and ordered to in and out cath and to repeat bladder scan and in and out cath q 12 hrs. Pt put out 500 mLs with in and out cath.

## 2016-12-23 NOTE — Progress Notes (Signed)
  Echocardiogram 2D Echocardiogram has been performed.  Gloria Best 12/23/2016, 5:29 PM

## 2016-12-23 NOTE — Progress Notes (Signed)
[facility] PROGRESS NOTE  Gloria Best EVO:[id number] DOB: July 09, 1941 DOA: 12/21/2016   PCP: Marco Collie, MD     Assessment/Plan: Active Problems:   Elevated WBC count   Hematemesis with nausea  Gloria Best is a 76 y.o. female  past mental history significant for of anemia, anxiety, asthma, bladder disorder, recurrent UTIs, chronic kidney disease, diabetes MR Roger, reflux, hypertension, pneumonia, sleep apnea presents to emergency room with chief complaint hematemesis from her nursing facility. Per report from EDP patient was sent over for multiple episodes of emesis earlier today. One sample was checked and was positive for blood.  . EDP recommended consulting GI for upper GI bleed. Patient has had multiple normal bowel movements in the emergency room. CT of the abdomen and pelvis showed significant constipation.      Assessment and plan Sepsis, likely secondary to urinary source White blood cell count significantly elevated Follow blood culture 2, urine culture pending Discontinued Foley CT abdomen pelvis showed large amount of stool Chest x-ray negative GI panel to rule out gastroenteritis as the patient has had nausea with vomiting 2 days Renal ultrasound to rule out obstructive uropathy   Hematemesis, patient denies, hemoglobin stable, baseline between 9-10 continue Protonix, check FOBT UA negative for blood Continue Pradaxa for now for DVT prophylaxis Started on PPI  SVT/atrial fibrillation, in the setting of sepsis Given 1 dose of IV metoprolol, cardiac enzymes borderline, obtain 2-D echo to rule out wall motion abnormalities May need ischemic workup in the future Continue Cardizem, did not need IV Cardizem as the patient spontaneously converted after repletion of electrolytes Continue Pradaxa Venous Doppler negative for DVT Low threshold to rule out PE  Leukocytosis Bandemia, left shift secondary to infection   Hypertension Cont  cardizem Hold Lasix, hydrating with IV fluids   Diabetes Most recent hemoglobin A1c was 6 Continue current regimen  Hypokalemia-repleted  GERD Cont PPI/H2 blocker  Anxiety/depression Cont lexapro and abilify  HTN Hypertensive continue current meds    Constipation, 6 bowel movements since admission overnight Cleanout rectum WITH combination of suppositories and enemas Consider daily MiraLAX afterwards Holding constipating meds  Hyperlipidemia Continue statin   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  Does not appear to be have a septic joint    DVT prophylaxsis Pradaxa  Code Status:  Full code     Family Communication: Discussed in detail with the patient, all imaging results, lab results explained to the patient   Disposition Plan:  Continue stepdown     Consultants:  None  Procedures:  None  Antibiotics: Anti-infectives    Start     Dose/Rate Route Frequency Ordered Stop   12/23/16 1300  vancomycin (VANCOCIN) IVPB 750 mg/150 ml premix     750 mg 150 mL/hr over 60 Minutes Intravenous Every 24 hours 12/22/16 1216     12/22/16 1530  ceFEPIme (MAXIPIME) 1 g in dextrose 5 % 50 mL IVPB     1 g 100 mL/hr over 30 Minutes Intravenous Every 24 hours 12/22/16 1216     12/22/16 1230  vancomycin (VANCOCIN) 1,250 mg in sodium chloride 0.9 % 250 mL IVPB     1,250 mg 166.7 mL/hr over 90 Minutes Intravenous  Once 12/22/16 1216 12/22/16 1609   12/21/16 2315  hydroxychloroquine (PLAQUENIL) tablet 200 mg  Status:  Discontinued     200 mg Oral 2 times daily 12/21/16 1857 12/22/16 1036   12/21/16 2300  piperacillin-tazobactam (ZOSYN) IVPB 3.375 g  Status:  Discontinued     3.375 g 12.5 mL/hr over 240 Minutes Intravenous Every 8 hours 12/21/16 1638 12/22/16 1216   12/21/16 1645  piperacillin-tazobactam (ZOSYN) IVPB 3.375 g  Status:  Discontinued     3.375 g 100 mL/hr over 30 Minutes Intravenous Every 8 hours 12/21/16 1630 12/21/16 1636   12/21/16 1645   piperacillin-tazobactam (ZOSYN) IVPB 3.375 g     3.375 g 100 mL/hr over 30 Minutes Intravenous  Once 12/21/16 1638 12/22/16 0700         HPI/Subjective: Converted to normal  Sinus  yesterday evening hoarseness in the 70's  Objective: Vitals:   12/22/16 2326 12/23/16 0310 12/23/16 0700 12/23/16 0747  BP: (!) 169/79 (!) 153/47 (!) 167/55   Pulse: 74 79 72   Resp: 17 17 16    Temp: 97.6 F (36.4 C) 97.8 F (36.6 C)  98.2 F (36.8 C)  TempSrc: Oral Oral  Oral  SpO2: 95% 95% 95%   Weight:  71.8 kg (158 lb 4.6 oz)    Height:        Intake/Output Summary (Last 24 hours) at 12/23/16 0850 Last data filed at 12/23/16 0400  Gross per 24 hour  Intake           1682.5 ml  Output              825 ml  Net            857.5 ml    Exam:  Examination:  General exam: Appears calm and comfortable  Respiratory system: Clear to auscultation. Respiratory effort normal. Cardiovascular system: S1 & S2 heard, RRR. No JVD, murmurs, rubs, gallops or clicks. No pedal edema. Gastrointestinal system: Abdomen is nondistended, soft and nontender. No organomegaly or masses felt. Normal bowel sounds heard. Central nervous system: Alert and oriented. No focal neurological deficits. Extremities: right knee dressing intact, knee not warm Skin: No rashes, lesions or ulcers Psychiatry: Judgement and insight appear normal. Mood & affect appropriate.     Data Reviewed: I have personally reviewed following labs and imaging studies  Micro Results Recent Results (from the past 240 hour(s))  Culture, blood (routine x 2)     Status: None (Preliminary result)   Collection Time: 12/21/16  4:36 PM  Result Value Ref Range Status   Specimen Description BLOOD RIGHT HAND  Final   Special Requests BOTTLES DRAWN AEROBIC AND ANAEROBIC 5CC  Final   Culture NO GROWTH < 24 HOURS  Final   Report Status PENDING  Incomplete  Culture, blood (routine x 2)     Status: None (Preliminary result)   Collection Time: 12/21/16   4:36 PM  Result Value Ref Range Status   Specimen Description BLOOD LEFT HAND  Final   Special Requests AEROBIC BOTTLE ONLY  5CC  Final   Culture NO GROWTH < 24 HOURS  Final   Report Status PENDING  Incomplete  MRSA PCR Screening     Status: None   Collection Time: 12/21/16  7:01 PM  Result Value Ref Range Status   MRSA by PCR NEGATIVE NEGATIVE Final    Comment:        The GeneXpert MRSA Assay (FDA approved for NASAL specimens only), is one component of a comprehensive MRSA colonization surveillance program. It is not intended to diagnose MRSA infection nor to guide or monitor treatment for MRSA infections.     Radiology Reports Ct Abdomen Pelvis Wo Contrast  Result Date: 12/21/2016 CLINICAL DATA:  Acute mid abdominal pain.  EXAM: CT ABDOMEN AND PELVIS WITHOUT CONTRAST TECHNIQUE: Multidetector CT imaging of the abdomen and pelvis was performed following the standard protocol without IV contrast. COMPARISON:  CT scan of September 18, 2014. FINDINGS: Lower chest: Mild bilateral posterior basilar subsegmental atelectasis is noted. Hepatobiliary: No focal liver abnormality is seen. No gallstones, gallbladder wall thickening, or biliary dilatation. Pancreas: Unremarkable. No pancreatic ductal dilatation or surrounding inflammatory changes. Spleen: Normal in size without focal abnormality. Adrenals/Urinary Tract: Adrenal glands appear normal. No hydronephrosis or renal obstruction is noted. No renal or ureteral calculi are noted. Urinary bladder is decompressed secondary to Foley catheter. Stomach/Bowel: Large amount of stool is noted throughout the colon and rectum concerning for impaction. No small bowel dilatation is noted. Vascular/Lymphatic: Aortic atherosclerosis. No enlarged abdominal or pelvic lymph nodes. Reproductive: Status post hysterectomy. No adnexal masses. Other: No abdominal wall hernia or abnormality. No abdominopelvic ascites. Musculoskeletal: Status post right hip arthroplasty.  Status post surgical fusion of lower lumbar spine. IMPRESSION: Aortic atherosclerosis. Large amount of stool seen throughout the colon and rectum concerning for rectal impaction. No other significant abnormality seen in the abdomen or pelvis. Electronically Signed   By: Marijo Conception, M.D.   On: 12/21/2016 14:56   Ct Head Wo Contrast  Result Date: 12/22/2016 CLINICAL DATA:  Altered mental status, history diabetes mellitus, dysrhythmia, hypertension, former smoker, asthma EXAM: CT HEAD WITHOUT CONTRAST TECHNIQUE: Contiguous axial images were obtained from the base of the skull through the vertex without intravenous contrast. COMPARISON:  11/24/2011 FINDINGS: Brain: Generalized atrophy. Normal ventricular morphology. No midline shift or mass effect. Small vessel chronic ischemic changes of deep cerebral white matter. No intracranial hemorrhage, mass lesion, evidence of acute infarction, or extra-axial fluid collection. Vascular: Mild atherosclerotic calcifications at the carotid siphons Skull: Intact Sinuses/Orbits: Clear Other: N/A IMPRESSION: Atrophy with small vessel chronic ischemic changes of deep cerebral white matter. No acute intracranial abnormalities. Electronically Signed   By: Lavonia Dana M.D.   On: 12/22/2016 16:41   Portable Chest 1 View  Result Date: 12/22/2016 CLINICAL DATA:  Elevated white blood cell count. No shortness of breath. History of diabetes and hypertension. EXAM: PORTABLE CHEST 1 VIEW COMPARISON:  12/21/2016. FINDINGS: Cardiac silhouette is normal in size. No mediastinal or hilar masses. No convincing adenopathy. Mild increased bronchovascular markings are noted bilaterally most evident at the medial lung bases. There is likely a component of atelectasis at the medial lung bases. No convincing pneumonia. No pulmonary edema. No pleural effusion or pneumothorax. Skeletal structures are intact. IMPRESSION: No acute cardiopulmonary disease. Electronically Signed   By: Lajean Manes  M.D.   On: 12/22/2016 07:46   Dg Chest Portable 1 View  Result Date: 12/21/2016 CLINICAL DATA:  Cough, vomiting and abdominal pain. EXAM: PORTABLE CHEST 1 VIEW COMPARISON:  12/10/2016 FINDINGS: The heart is normal in size. The mediastinal and hilar contours are stable. Mild tortuosity of the thoracic aorta. The lungs are clear. No pleural effusion. The right IJ catheter has been removed. The bony thorax is intact. IMPRESSION: No acute cardiopulmonary findings. Electronically Signed   By: Marijo Sanes M.D.   On: 12/21/2016 12:21   Dg Chest Port 1 View  Result Date: 12/10/2016 CLINICAL DATA:  Status post central venous catheter placement prior to knee surgery. EXAM: PORTABLE CHEST 1 VIEW COMPARISON:  PA and lateral chest x-ray of September 10, 2016 FINDINGS: The lungs are well-expanded. There is no pneumothorax or pleural effusion. The interstitial markings are mildly prominent though stable. The cardiac silhouette is  top-normal in size. The pulmonary vascularity is normal. There is calcification in the wall of the aortic arch. The right internal jugular venous catheter tip projects over the junction of the middle and distal thirds of the SVC. The observed bony thorax is unremarkable. IMPRESSION: There is no immediate postprocedure complication following placement of the right internal jugular venous catheter. The tip of the catheter projects at the junction of the middle and distal SVC. Mild chronic bronchitic changes. Thoracic aortic atherosclerosis. Electronically Signed   By: David  Martinique M.D.   On: 12/10/2016 11:39   Dg Abd Portable 1v  Result Date: 12/22/2016 CLINICAL DATA:  Last normal bowel movement 2 weeks ago. Constipation. EXAM: PORTABLE ABDOMEN - 1 VIEW COMPARISON:  CT, 12/21/2016. FINDINGS: There is no cough dilation to suggest obstruction or significant adynamic ileus. There has been an interval decrease in stool in the rectum and left colon. There is still mild increased colonic stool in the  right colon. No evidence of renal or ureteral stones. Soft tissues are unremarkable. Stable changes from previous lumbar spine fusion and right hip replacement. IMPRESSION: 1. There has been a decrease in stool in the rectum and left colon since the previous day's CT scan with persistent increased stool noted in the right colon. 2. No acute findings.  No evidence of bowel obstruction. Electronically Signed   By: Lajean Manes M.D.   On: 12/22/2016 07:45     CBC  Recent Labs Lab 12/21/16 1154 12/22/16 0806 12/23/16 0218  WBC 27.0* 36.6* 34.5*  HGB 9.9* 9.7* 8.6*  HCT 30.0* 28.9* 26.7*  PLT 730* 692* 683*  MCV 83.6 83.3 84.0  MCH 27.6 28.0 27.0  MCHC 33.0 33.6 32.2  RDW 14.7 15.0 15.4  LYMPHSABS  --   --  3.1  MONOABS  --   --  2.8*  EOSABS  --   --  0.0  BASOSABS  --   --  0.0    Chemistries   Recent Labs Lab 12/21/16 1154 12/22/16 0806 12/22/16 1147 12/23/16 0218  NA 135 136  --  137  K 3.6 3.2*  --  4.1  CL 95* 102  --  106  CO2 20* 19*  --  19*  GLUCOSE 157* 152*  --  109*  BUN 23* 17  --  19  CREATININE 1.81* 1.45*  --  1.42*  CALCIUM 9.4 8.3*  --  8.4*  MG  --   --  2.2  --   AST 22  --   --  23  ALT 14  --   --  13*  ALKPHOS 71  --   --  65  BILITOT 1.0  --   --  0.9   ------------------------------------------------------------------------------------------------------------------ estimated creatinine clearance is 29.8 mL/min (by C-G formula based on SCr of 1.42 mg/dL (H)). ------------------------------------------------------------------------------------------------------------------ No results for input(s): HGBA1C in the last 72 hours. ------------------------------------------------------------------------------------------------------------------ No results for input(s): CHOL, HDL, LDLCALC, TRIG, CHOLHDL, LDLDIRECT in the last 72  hours. ------------------------------------------------------------------------------------------------------------------  Recent Labs  12/22/16 0806  TSH 1.224   ------------------------------------------------------------------------------------------------------------------ No results for input(s): VITAMINB12, FOLATE, FERRITIN, TIBC, IRON, RETICCTPCT in the last 72 hours.  Coagulation profile  Recent Labs Lab 12/21/16 1154  INR 1.12     Recent Labs  12/22/16 0806  DDIMER 4.98*    Cardiac Enzymes  Recent Labs Lab 12/22/16 0806 12/22/16 1147 12/22/16 1836  TROPONINI 0.29* 0.36* 0.31*   ------------------------------------------------------------------------------------------------------------------ Invalid input(s): POCBNP   CBG:  Recent Labs Lab 12/22/16 1952  GLUCAP 143*       Studies: Ct Abdomen Pelvis Wo Contrast  Result Date: 12/21/2016 CLINICAL DATA:  Acute mid abdominal pain. EXAM: CT ABDOMEN AND PELVIS WITHOUT CONTRAST TECHNIQUE: Multidetector CT imaging of the abdomen and pelvis was performed following the standard protocol without IV contrast. COMPARISON:  CT scan of September 18, 2014. FINDINGS: Lower chest: Mild bilateral posterior basilar subsegmental atelectasis is noted. Hepatobiliary: No focal liver abnormality is seen. No gallstones, gallbladder wall thickening, or biliary dilatation. Pancreas: Unremarkable. No pancreatic ductal dilatation or surrounding inflammatory changes. Spleen: Normal in size without focal abnormality. Adrenals/Urinary Tract: Adrenal glands appear normal. No hydronephrosis or renal obstruction is noted. No renal or ureteral calculi are noted. Urinary bladder is decompressed secondary to Foley catheter. Stomach/Bowel: Large amount of stool is noted throughout the colon and rectum concerning for impaction. No small bowel dilatation is noted. Vascular/Lymphatic: Aortic atherosclerosis. No enlarged abdominal or pelvic lymph  nodes. Reproductive: Status post hysterectomy. No adnexal masses. Other: No abdominal wall hernia or abnormality. No abdominopelvic ascites. Musculoskeletal: Status post right hip arthroplasty. Status post surgical fusion of lower lumbar spine. IMPRESSION: Aortic atherosclerosis. Large amount of stool seen throughout the colon and rectum concerning for rectal impaction. No other significant abnormality seen in the abdomen or pelvis. Electronically Signed   By: Marijo Conception, M.D.   On: 12/21/2016 14:56   Ct Head Wo Contrast  Result Date: 12/22/2016 CLINICAL DATA:  Altered mental status, history diabetes mellitus, dysrhythmia, hypertension, former smoker, asthma EXAM: CT HEAD WITHOUT CONTRAST TECHNIQUE: Contiguous axial images were obtained from the base of the skull through the vertex without intravenous contrast. COMPARISON:  11/24/2011 FINDINGS: Brain: Generalized atrophy. Normal ventricular morphology. No midline shift or mass effect. Small vessel chronic ischemic changes of deep cerebral white matter. No intracranial hemorrhage, mass lesion, evidence of acute infarction, or extra-axial fluid collection. Vascular: Mild atherosclerotic calcifications at the carotid siphons Skull: Intact Sinuses/Orbits: Clear Other: N/A IMPRESSION: Atrophy with small vessel chronic ischemic changes of deep cerebral white matter. No acute intracranial abnormalities. Electronically Signed   By: Lavonia Dana M.D.   On: 12/22/2016 16:41   Portable Chest 1 View  Result Date: 12/22/2016 CLINICAL DATA:  Elevated white blood cell count. No shortness of breath. History of diabetes and hypertension. EXAM: PORTABLE CHEST 1 VIEW COMPARISON:  12/21/2016. FINDINGS: Cardiac silhouette is normal in size. No mediastinal or hilar masses. No convincing adenopathy. Mild increased bronchovascular markings are noted bilaterally most evident at the medial lung bases. There is likely a component of atelectasis at the medial lung bases. No  convincing pneumonia. No pulmonary edema. No pleural effusion or pneumothorax. Skeletal structures are intact. IMPRESSION: No acute cardiopulmonary disease. Electronically Signed   By: Lajean Manes M.D.   On: 12/22/2016 07:46   Dg Chest Portable 1 View  Result Date: 12/21/2016 CLINICAL DATA:  Cough, vomiting and abdominal pain. EXAM: PORTABLE CHEST 1 VIEW COMPARISON:  12/10/2016 FINDINGS: The heart is normal in size. The mediastinal and hilar contours are stable. Mild tortuosity of the thoracic aorta. The lungs are clear. No pleural effusion. The right IJ catheter has been removed. The bony thorax is intact. IMPRESSION: No acute cardiopulmonary findings. Electronically Signed   By: Marijo Sanes M.D.   On: 12/21/2016 12:21   Dg Abd Portable 1v  Result Date: 12/22/2016 CLINICAL DATA:  Last normal bowel movement 2 weeks ago. Constipation. EXAM: PORTABLE ABDOMEN - 1 VIEW COMPARISON:  CT, 12/21/2016. FINDINGS: There is no cough dilation to suggest  obstruction or significant adynamic ileus. There has been an interval decrease in stool in the rectum and left colon. There is still mild increased colonic stool in the right colon. No evidence of renal or ureteral stones. Soft tissues are unremarkable. Stable changes from previous lumbar spine fusion and right hip replacement. IMPRESSION: 1. There has been a decrease in stool in the rectum and left colon since the previous day's CT scan with persistent increased stool noted in the right colon. 2. No acute findings.  No evidence of bowel obstruction. Electronically Signed   By: Lajean Manes M.D.   On: 12/22/2016 07:45      Lab Results  Component Value Date   HGBA1C 6.0 (H) 12/10/2016   HGBA1C 6.0 (H) 12/10/2016   HGBA1C 5.6 08/27/2016   Lab Results  Component Value Date   CREATININE 1.42 (H) 12/23/2016       Scheduled Meds: . atorvastatin  80 mg Oral q1800  . ceFEPime (MAXIPIME) IV  1 g Intravenous Q24H  . dabigatran  150 mg Oral QODAY  .  diltiazem  240 mg Oral Daily  . milk and molasses  1 enema Rectal Once  . pantoprazole  40 mg Oral Daily  . sodium chloride  250 mL Intravenous Once  . sodium chloride flush  3 mL Intravenous Q12H  . vancomycin  750 mg Intravenous Q24H   Continuous Infusions: . 0.9 % NaCl with KCl 40 mEq / L 75 mL/hr at 12/23/16 0400     LOS: 1 day    Time spent: >30 MINS    [facility] Pager [phone]. If 7PM-7AM, please contact night-coverage at [email or web address], password [email or web address] 12/23/2016, 8:50 AM  LOS: 1 day

## 2016-12-23 NOTE — Discharge Instructions (Signed)
Information on my medicine - Pradaxa (dabigatran)  This medication education was reviewed with me or my healthcare representative as part of my discharge preparation.  The pharmacist that spoke with me during my hospital stay was:  Einar Grad, [facility]  Why was Pradaxa prescribed for you? Pradaxa was prescribed for you to reduce the risk of forming blood clots that cause a stroke if you have a medical condition called atrial fibrillation (a type of irregular heartbeat).    What do you Need to know about PradAXa? Take your Pradaxa TWICE DAILY - one capsule in the morning and one tablet in the evening with or without food.  It would be best to take the doses about the same time each day.  The capsules should not be broken, chewed or opened - they must be swallowed whole.  Do not store Pradaxa in other medication containers - once the bottle is opened the Pradaxa should be used within FOUR months; throw away any capsules that havent been by that time.  Take Pradaxa exactly as prescribed by your doctor.  DO NOT stop taking Pradaxa without talking to the doctor who prescribed the medication.  Stopping without other stroke prevention medication to take the place of Pradaxa may increase your risk of developing a clot that causes a stroke.  Refill your prescription before you run out.  After discharge, you should have regular check-up appointments with your healthcare provider that is prescribing your Pradaxa.  In the future your dose may need to be changed if your kidney function or weight changes by a significant amount.  What do you do if you miss a dose? If you miss a dose, take it as soon as you remember on the same day.  If your next dose is less than 6 hours away, skip the missed dose.  Do not take two doses of PRADAXA at the same time.  Important Safety Information A possible side effect of Pradaxa is bleeding. You should call your healthcare provider right away if you experience  any of the following: ? Bleeding from an injury or your nose that does not stop. ? Unusual colored urine (red or dark brown) or unusual colored stools (red or black). ? Unusual bruising for unknown reasons. ? A serious fall or if you hit your head (even if there is no bleeding).  Some medicines may interact with Pradaxa and might increase your risk of bleeding or clotting while on Pradaxa. To help avoid this, consult your healthcare provider or pharmacist prior to using any new prescription or non-prescription medications, including herbals, vitamins, non-steroidal anti-inflammatory drugs (NSAIDs) and supplements.  This website has more information on Pradaxa (dabigatran): [email or web address]://www.pradaxa.com

## 2016-12-24 ENCOUNTER — Inpatient Hospital Stay (INDEPENDENT_AMBULATORY_CARE_PROVIDER_SITE_OTHER): Payer: Medicare Other | Admitting: Specialist

## 2016-12-24 DIAGNOSIS — D72823 Leukemoid reaction: Secondary | ICD-10-CM

## 2016-12-24 LAB — ECHOCARDIOGRAM COMPLETE
AO mean calculated velocity dopler: 169 cm/s
AOPV: 0.71 m/s
AOVTI: 47.7 cm
AV Area VTI index: 1.11 cm2/m2
AV Area VTI: 1.79 cm2
AV Area mean vel: 1.77 cm2
AV area mean vel ind: 1.05 cm2/m2
AV vel: 1.87
AVA: 1.87 cm2
AVCELMEANRAT: 0.7
AVG: 14 mmHg
AVPG: 28 mmHg
AVPKVEL: 265 cm/s
CHL CUP AV PEAK INDEX: 1.06
CHL CUP AV VALUE AREA INDEX: 1.11
CHL CUP MV DEC (S): 224
CHL CUP RV SYS PRESS: 56 mmHg
E decel time: 224 msec
E/e' ratio: 25.84
FS: 34 % (ref 28–44)
Height: 60 in
IV/PV OW: 1.05
LA ID, A-P, ES: 47 mm
LA diam end sys: 47 mm
LADIAMINDEX: 2.78 cm/m2
LAVOL: 85 mL
LAVOLA4C: 80.9 mL
LAVOLIN: 50.3 mL/m2
LV E/e' medial: 25.84
LV PW d: 9.28 mm — AB (ref 0.6–1.1)
LV e' LATERAL: 7.43 cm/s
LVEEAVG: 25.84
LVOT VTI: 35.2 cm
LVOT area: 2.54 cm2
LVOT peak grad rest: 14 mmHg
LVOT peak vel: 187 cm/s
LVOTD: 18 mm
LVOTSV: 89 mL
LVOTVTI: 0.74 cm
Lateral S' vel: 17.3 cm/s
MV Peak grad: 15 mmHg
MV pk E vel: 192 m/s
MVPKAVEL: 119 m/s
P 1/2 time: 303 ms
Reg peak vel: 363 cm/s
TAPSE: 24.3 mm
TDI e' lateral: 7.43
TDI e' medial: 8.16
TR max vel: 363 cm/s
Weight: 2532.64 oz

## 2016-12-24 LAB — CBC
HCT: 26.1 % — ABNORMAL LOW (ref 36.0–46.0)
Hemoglobin: 8.3 g/dL — ABNORMAL LOW (ref 12.0–15.0)
MCH: 26.9 pg (ref 26.0–34.0)
MCHC: 31.8 g/dL (ref 30.0–36.0)
MCV: 84.7 fL (ref 78.0–100.0)
PLATELETS: 696 10*3/uL — AB (ref 150–400)
RBC: 3.08 MIL/uL — ABNORMAL LOW (ref 3.87–5.11)
RDW: 15.6 % — AB (ref 11.5–15.5)
WBC: 27.6 10*3/uL — AB (ref 4.0–10.5)

## 2016-12-24 LAB — DIFFERENTIAL
BASOS ABS: 0 10*3/uL (ref 0.0–0.1)
Basophils Relative: 0 %
Eosinophils Absolute: 0.3 10*3/uL (ref 0.0–0.7)
Eosinophils Relative: 1 %
LYMPHS ABS: 3 10*3/uL (ref 0.7–4.0)
Lymphocytes Relative: 11 %
MONO ABS: 3.3 10*3/uL — AB (ref 0.1–1.0)
MONOS PCT: 12 %
Neutro Abs: 21 10*3/uL — ABNORMAL HIGH (ref 1.7–7.7)
Neutrophils Relative %: 76 %

## 2016-12-24 LAB — BASIC METABOLIC PANEL
ANION GAP: 10 (ref 5–15)
BUN: 20 mg/dL (ref 6–20)
CALCIUM: 8.2 mg/dL — AB (ref 8.9–10.3)
CO2: 17 mmol/L — ABNORMAL LOW (ref 22–32)
Chloride: 105 mmol/L (ref 101–111)
Creatinine, Ser: 1.26 mg/dL — ABNORMAL HIGH (ref 0.44–1.00)
GFR, EST AFRICAN AMERICAN: 47 mL/min — AB (ref >60)
GFR, EST NON AFRICAN AMERICAN: 40 mL/min — AB (ref >60)
Glucose, Bld: 105 mg/dL — ABNORMAL HIGH (ref 65–99)
Potassium: 4.3 mmol/L (ref 3.5–5.1)
Sodium: 132 mmol/L — ABNORMAL LOW (ref 135–145)

## 2016-12-24 LAB — MAGNESIUM: Magnesium: 2.5 mg/dL — ABNORMAL HIGH (ref 1.7–2.4)

## 2016-12-24 MED ORDER — SODIUM CHLORIDE 0.9 % IV SOLN
250.0000 mL | INTRAVENOUS | Status: DC | PRN
  Administered 2016-12-24: 21:00:00 via INTRAVENOUS

## 2016-12-24 MED ORDER — TAMSULOSIN HCL 0.4 MG PO CAPS
0.4000 mg | ORAL_CAPSULE | Freq: Every day | ORAL | Status: DC
  Administered 2016-12-24 – 2016-12-27 (×4): 0.4 mg via ORAL
  Filled 2016-12-24 (×4): qty 1

## 2016-12-24 MED ORDER — SODIUM CHLORIDE 0.9 % IV SOLN: INTRAVENOUS | Status: DC

## 2016-12-24 MED ORDER — SODIUM CHLORIDE 0.9 % IV SOLN: 250.0000 mL | INTRAVENOUS | Status: DC | PRN

## 2016-12-24 MED ORDER — ESCITALOPRAM OXALATE 10 MG PO TABS
10.0000 mg | ORAL_TABLET | Freq: Every day | ORAL | Status: DC
  Administered 2016-12-24 – 2016-12-27 (×4): 10 mg via ORAL
  Filled 2016-12-24 (×4): qty 1

## 2016-12-24 MED ORDER — POLYETHYLENE GLYCOL 3350 17 G PO PACK
17.0000 g | PACK | Freq: Every day | ORAL | Status: DC
  Administered 2016-12-24 – 2016-12-27 (×4): 17 g via ORAL
  Filled 2016-12-24 (×4): qty 1

## 2016-12-24 MED ORDER — MIRABEGRON ER 25 MG PO TB24
50.0000 mg | ORAL_TABLET | Freq: Every day | ORAL | Status: DC
  Administered 2016-12-24 – 2016-12-27 (×4): 50 mg via ORAL
  Filled 2016-12-24 (×5): qty 2

## 2016-12-24 MED ORDER — SODIUM CHLORIDE 0.9% FLUSH: 3.0000 mL | Freq: Two times a day (BID) | INTRAVENOUS | Status: DC

## 2016-12-24 MED ORDER — PREGABALIN 50 MG PO CAPS
50.0000 mg | ORAL_CAPSULE | Freq: Two times a day (BID) | ORAL | Status: DC
  Administered 2016-12-24 – 2016-12-27 (×7): 50 mg via ORAL
  Filled 2016-12-24 (×7): qty 1

## 2016-12-24 MED ORDER — ROPINIROLE HCL 0.5 MG PO TABS
0.5000 mg | ORAL_TABLET | Freq: Every day | ORAL | Status: DC
  Administered 2016-12-24 – 2016-12-26 (×3): 0.5 mg via ORAL
  Filled 2016-12-24 (×3): qty 1

## 2016-12-24 MED ORDER — SODIUM CHLORIDE 0.9% FLUSH: 3.0000 mL | INTRAVENOUS | Status: DC | PRN

## 2016-12-24 NOTE — Care Management Note (Addendum)
Case Management Note  Patient Details  Name: Gloria Best MRN: [id number] Date of Birth: 1941/08/04  Subjective/Objective:  Presents from [facility] [facility] s/p RTK 2/5, with sepsis secondary to urinary source, hematemesis, svt/afib, leukocytosis, htn, dm, anxiety/depression, htn, hld.  Patient states she has medication coverage and she has a pcp, she states she does not plan to go back to [place] , she plans to go home with her daughter, Jeani Hawking [phone],  in [place] with [facility] services.  She has a bsc, shower chair, rolling walker and a w/chair at home.  She states she has had [facility] in the past and would like to have them again.  Patient will benefit from pt/ot eval.  NCM made referral to [facility] for [facility], PT, [place] and aide.  NCM will cont to follow for dc needs.                   Action/Plan:   Expected Discharge Date:                  Expected Discharge Plan:  [place]  In-House Referral:     Discharge planning Services  CM Consult  Post Acute Care Choice:    Choice offered to:  Patient  DME Arranged:    DME Agency:     HH Arranged:  RN, PT, OT, Nurse's Aide [place] Agency:  [facility]  Status of Service:  In process, will continue to follow  If discussed at Long Length of Stay Meetings, dates discussed:    Additional Comments:  Zenon Mayo, RN 12/24/2016, 11:45 AM

## 2016-12-24 NOTE — Progress Notes (Addendum)
[facility] PROGRESS NOTE  Gloria Best WLN:[id number] DOB: 05-26-1941 DOA: 12/21/2016   PCP: Marco Collie, MD     Assessment/Plan: Active Problems:   Elevated WBC count   Hematemesis with nausea   Acute renal failure (ARF) ([place])  Gloria Best is a 76 y.o. female  past mental history significant for of anemia, anxiety, asthma, bladder disorder, recurrent UTIs, chronic kidney disease, diabetes MR Roger, reflux, hypertension, pneumonia, sleep apnea presents to emergency room with chief complaint hematemesis from her nursing facility. Per report from EDP patient was sent over for multiple episodes of emesis earlier today. One sample was checked and was positive for blood.  . EDP recommended consulting GI for upper GI bleed. Patient has had multiple normal bowel movements in the emergency room. CT of the abdomen and pelvis showed significant constipation.      Assessment and plan Sepsis, likely secondary to urinary source, patient has had a indwelling Foley since surgery White blood cell count significantly elevated-likely leukemoid reaction Follow blood culture 2, urine culture pending Discontinued Foley, CT abdomen pelvis showed Large amount of stool seen throughout the colon and rectum concerning for rectal impaction  Chest x-ray negative GI panel to rule out gastroenteritis as the patient has had nausea with vomiting 2 days was negative Continue zosyn, dc vanc    Urinary retention Renal ultrasound-no urinary retention, no hydronephrosis,Diffusely increased renal cortical echogenicity as can be seen with chronic medical renal disease. Foley discontinued Hold MYBETRIQ,ditropan as both can cause retention, reviewed meds with pharmacy Will order PVR , in and out cath Started on flomax  Hematemesis, patient denies, hemoglobin stable, baseline between 9-10, hemoglobin now 8.3, no signs of active bleeding  continue Protonix,FOBT positive UA negative for  blood Continue Pradaxa for now for DVT prophylaxis Started on PPI  SVT/atrial fibrillation, in the setting of sepsis Sustained for severeal hours , now  With  IV metoprolol, magnesium,K   Repletion cardiac enzymes borderline,   2-D echo to rule out wall motion abnormalities, none seen May need ischemic workup in the future Continue Cardizem, did not need IV Cardizem as the patient spontaneously converted after repletion of electrolytes Continue Pradaxa Venous Doppler negative for DVT Low threshold to rule out PE  Leukocytosis Bandemia, left shift secondary to infection Improving   Hypertension Cont cardizem Hold Lasix, hydrating with IV fluids   Diabetes Most recent hemoglobin A1c was 6 Continue current regimen  Hypokalemia-repleted  GERD Cont PPI/H2 blocker  Anxiety/depression Resume lexapro   HTN Hypertensive continue current meds    Constipation, 6 bowel movements since admission overnight Cleaned rectum WITH combination of suppositories and enemas daily MiraLAX  Holding constipating meds  Hyperlipidemia Continue statin   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  Does not appear to be have a septic joint    DVT prophylaxsis Pradaxa  Code Status:  Full code     Family Communication: Discussed in detail with the patient, all imaging results, lab results explained to the patient   Disposition Plan:  Continue tele, dc in 1-2 days with [facility]      Consultants:  None  Procedures:  None  Antibiotics: Anti-infectives    Start     Dose/Rate Route Frequency Ordered Stop   12/23/16 1300  vancomycin (VANCOCIN) IVPB 750 mg/150 ml premix     750 mg 150 mL/hr over 60 Minutes Intravenous Every 24 hours 12/22/16 1216     12/22/16 1530  ceFEPIme (MAXIPIME) 1 g  in dextrose 5 % 50 mL IVPB     1 g 100 mL/hr over 30 Minutes Intravenous Every 24 hours 12/22/16 1216     12/22/16 1230  vancomycin (VANCOCIN) 1,250 mg in sodium chloride 0.9 % 250 mL  IVPB     1,250 mg 166.7 mL/hr over 90 Minutes Intravenous  Once 12/22/16 1216 12/22/16 1609   12/21/16 2315  hydroxychloroquine (PLAQUENIL) tablet 200 mg  Status:  Discontinued     200 mg Oral 2 times daily 12/21/16 1857 12/22/16 1036   12/21/16 2300  piperacillin-tazobactam (ZOSYN) IVPB 3.375 g  Status:  Discontinued     3.375 g 12.5 mL/hr over 240 Minutes Intravenous Every 8 hours 12/21/16 1638 12/22/16 1216   12/21/16 1645  piperacillin-tazobactam (ZOSYN) IVPB 3.375 g  Status:  Discontinued     3.375 g 100 mL/hr over 30 Minutes Intravenous Every 8 hours 12/21/16 1630 12/21/16 1636   12/21/16 1645  piperacillin-tazobactam (ZOSYN) IVPB 3.375 g     3.375 g 100 mL/hr over 30 Minutes Intravenous  Once 12/21/16 1638 12/22/16 0700         HPI/Subjective: Continues to have urinary retention  Objective: Vitals:   12/23/16 1900 12/23/16 2211 12/24/16 0303 12/24/16 0758  BP: (!) 160/58 (!) 166/60 (!) 144/51 (!) 168/61  Pulse: 88 87 79 83  Resp: 19 18 18 18   Temp: 98.4 F (36.9 C) 98.5 F (36.9 C) 98.4 F (36.9 C) 97.8 F (36.6 C)  TempSrc: Oral Oral Oral Oral  SpO2: 94% 95% 95% 97%  Weight:      Height:        Intake/Output Summary (Last 24 hours) at 12/24/16 1011 Last data filed at 12/23/16 2318  Gross per 24 hour  Intake              240 ml  Output             1000 ml  Net             -760 ml    Exam:  Examination:  General exam: Appears calm and comfortable  Respiratory system: Clear to auscultation. Respiratory effort normal. Cardiovascular system: S1 & S2 heard, RRR. No JVD, murmurs, rubs, gallops or clicks. No pedal edema. Gastrointestinal system: Abdomen is nondistended, soft and nontender. No organomegaly or masses felt. Normal bowel sounds heard. Central nervous system: Alert and oriented. No focal neurological deficits. Extremities: right knee dressing intact, knee not warm Skin: No rashes, lesions or ulcers Psychiatry: Judgement and insight appear normal.  Mood & affect appropriate.     Data Reviewed: I have personally reviewed following labs and imaging studies  Micro Results Recent Results (from the past 240 hour(s))  Culture, blood (routine x 2)     Status: None (Preliminary result)   Collection Time: 12/21/16  4:36 PM  Result Value Ref Range Status   Specimen Description BLOOD RIGHT HAND  Final   Special Requests BOTTLES DRAWN AEROBIC AND ANAEROBIC 5CC  Final   Culture NO GROWTH 2 DAYS  Final   Report Status PENDING  Incomplete  Culture, blood (routine x 2)     Status: None (Preliminary result)   Collection Time: 12/21/16  4:36 PM  Result Value Ref Range Status   Specimen Description BLOOD LEFT HAND  Final   Special Requests AEROBIC BOTTLE ONLY  5CC  Final   Culture NO GROWTH 2 DAYS  Final   Report Status PENDING  Incomplete  MRSA PCR Screening     Status:  None   Collection Time: 12/21/16  7:01 PM  Result Value Ref Range Status   MRSA by PCR NEGATIVE NEGATIVE Final    Comment:        The GeneXpert MRSA Assay (FDA approved for NASAL specimens only), is one component of a comprehensive MRSA colonization surveillance program. It is not intended to diagnose MRSA infection nor to guide or monitor treatment for MRSA infections.   Gastrointestinal Panel by PCR , Stool     Status: None   Collection Time: 12/22/16 11:44 AM  Result Value Ref Range Status   Campylobacter species NOT DETECTED NOT DETECTED Final   Plesimonas shigelloides NOT DETECTED NOT DETECTED Final   Salmonella species NOT DETECTED NOT DETECTED Final   Yersinia enterocolitica NOT DETECTED NOT DETECTED Final   Vibrio species NOT DETECTED NOT DETECTED Final   Vibrio cholerae NOT DETECTED NOT DETECTED Final   Enteroaggregative E coli (EAEC) NOT DETECTED NOT DETECTED Final   Enteropathogenic E coli (EPEC) NOT DETECTED NOT DETECTED Final   Enterotoxigenic E coli (ETEC) NOT DETECTED NOT DETECTED Final   Shiga like toxin producing E coli (STEC) NOT DETECTED NOT  DETECTED Final   Shigella/Enteroinvasive E coli (EIEC) NOT DETECTED NOT DETECTED Final   Cryptosporidium NOT DETECTED NOT DETECTED Final   Cyclospora cayetanensis NOT DETECTED NOT DETECTED Final   Entamoeba histolytica NOT DETECTED NOT DETECTED Final   Giardia lamblia NOT DETECTED NOT DETECTED Final   Adenovirus F40/41 NOT DETECTED NOT DETECTED Final   Astrovirus NOT DETECTED NOT DETECTED Final   Norovirus GI/GII NOT DETECTED NOT DETECTED Final   Rotavirus A NOT DETECTED NOT DETECTED Final   Sapovirus (I, II, IV, and V) NOT DETECTED NOT DETECTED Final    Radiology Reports Ct Abdomen Pelvis Wo Contrast  Result Date: 12/21/2016 CLINICAL DATA:  Acute mid abdominal pain. EXAM: CT ABDOMEN AND PELVIS WITHOUT CONTRAST TECHNIQUE: Multidetector CT imaging of the abdomen and pelvis was performed following the standard protocol without IV contrast. COMPARISON:  CT scan of September 18, 2014. FINDINGS: Lower chest: Mild bilateral posterior basilar subsegmental atelectasis is noted. Hepatobiliary: No focal liver abnormality is seen. No gallstones, gallbladder wall thickening, or biliary dilatation. Pancreas: Unremarkable. No pancreatic ductal dilatation or surrounding inflammatory changes. Spleen: Normal in size without focal abnormality. Adrenals/Urinary Tract: Adrenal glands appear normal. No hydronephrosis or renal obstruction is noted. No renal or ureteral calculi are noted. Urinary bladder is decompressed secondary to Foley catheter. Stomach/Bowel: Large amount of stool is noted throughout the colon and rectum concerning for impaction. No small bowel dilatation is noted. Vascular/Lymphatic: Aortic atherosclerosis. No enlarged abdominal or pelvic lymph nodes. Reproductive: Status post hysterectomy. No adnexal masses. Other: No abdominal wall hernia or abnormality. No abdominopelvic ascites. Musculoskeletal: Status post right hip arthroplasty. Status post surgical fusion of lower lumbar spine. IMPRESSION:  Aortic atherosclerosis. Large amount of stool seen throughout the colon and rectum concerning for rectal impaction. No other significant abnormality seen in the abdomen or pelvis. Electronically Signed   By: Marijo Conception, M.D.   On: 12/21/2016 14:56   Ct Head Wo Contrast  Result Date: 12/22/2016 CLINICAL DATA:  Altered mental status, history diabetes mellitus, dysrhythmia, hypertension, former smoker, asthma EXAM: CT HEAD WITHOUT CONTRAST TECHNIQUE: Contiguous axial images were obtained from the base of the skull through the vertex without intravenous contrast. COMPARISON:  11/24/2011 FINDINGS: Brain: Generalized atrophy. Normal ventricular morphology. No midline shift or mass effect. Small vessel chronic ischemic changes of deep cerebral white matter. No intracranial hemorrhage,  mass lesion, evidence of acute infarction, or extra-axial fluid collection. Vascular: Mild atherosclerotic calcifications at the carotid siphons Skull: Intact Sinuses/Orbits: Clear Other: N/A IMPRESSION: Atrophy with small vessel chronic ischemic changes of deep cerebral white matter. No acute intracranial abnormalities. Electronically Signed   By: Lavonia Dana M.D.   On: 12/22/2016 16:41   US Renal  Result Date: 12/23/2016 CLINICAL DATA:  Patient with elevated creatinine. EXAM: RENAL / URINARY TRACT ULTRASOUND COMPLETE COMPARISON:  CT abdomen pelvis 12/21/2016 FINDINGS: Right Kidney: Length: 9.7 cm. Diffusely increased in echogenicity. Normal cortical thickness. No hydronephrosis. Left Kidney: Length: 8.9 cm. Diffusely increased in echogenicity. Normal cortical thickness. No hydronephrosis. Bladder: Appears normal for degree of bladder distention. IMPRESSION: No hydronephrosis. Diffusely increased renal cortical echogenicity as can be seen with chronic medical renal disease. Electronically Signed   By: Lovey Newcomer M.D.   On: 12/23/2016 19:46   Portable Chest 1 View  Result Date: 12/22/2016 CLINICAL DATA:  Elevated white blood  cell count. No shortness of breath. History of diabetes and hypertension. EXAM: PORTABLE CHEST 1 VIEW COMPARISON:  12/21/2016. FINDINGS: Cardiac silhouette is normal in size. No mediastinal or hilar masses. No convincing adenopathy. Mild increased bronchovascular markings are noted bilaterally most evident at the medial lung bases. There is likely a component of atelectasis at the medial lung bases. No convincing pneumonia. No pulmonary edema. No pleural effusion or pneumothorax. Skeletal structures are intact. IMPRESSION: No acute cardiopulmonary disease. Electronically Signed   By: Lajean Manes M.D.   On: 12/22/2016 07:46   Dg Chest Portable 1 View  Result Date: 12/21/2016 CLINICAL DATA:  Cough, vomiting and abdominal pain. EXAM: PORTABLE CHEST 1 VIEW COMPARISON:  12/10/2016 FINDINGS: The heart is normal in size. The mediastinal and hilar contours are stable. Mild tortuosity of the thoracic aorta. The lungs are clear. No pleural effusion. The right IJ catheter has been removed. The bony thorax is intact. IMPRESSION: No acute cardiopulmonary findings. Electronically Signed   By: Marijo Sanes M.D.   On: 12/21/2016 12:21   Dg Chest Port 1 View  Result Date: 12/10/2016 CLINICAL DATA:  Status post central venous catheter placement prior to knee surgery. EXAM: PORTABLE CHEST 1 VIEW COMPARISON:  PA and lateral chest x-ray of September 10, 2016 FINDINGS: The lungs are well-expanded. There is no pneumothorax or pleural effusion. The interstitial markings are mildly prominent though stable. The cardiac silhouette is top-normal in size. The pulmonary vascularity is normal. There is calcification in the wall of the aortic arch. The right internal jugular venous catheter tip projects over the junction of the middle and distal thirds of the SVC. The observed bony thorax is unremarkable. IMPRESSION: There is no immediate postprocedure complication following placement of the right internal jugular venous catheter. The tip  of the catheter projects at the junction of the middle and distal SVC. Mild chronic bronchitic changes. Thoracic aortic atherosclerosis. Electronically Signed   By: David  Martinique M.D.   On: 12/10/2016 11:39   Dg Abd Portable 1v  Result Date: 12/22/2016 CLINICAL DATA:  Last normal bowel movement 2 weeks ago. Constipation. EXAM: PORTABLE ABDOMEN - 1 VIEW COMPARISON:  CT, 12/21/2016. FINDINGS: There is no cough dilation to suggest obstruction or significant adynamic ileus. There has been an interval decrease in stool in the rectum and left colon. There is still mild increased colonic stool in the right colon. No evidence of renal or ureteral stones. Soft tissues are unremarkable. Stable changes from previous lumbar spine fusion and right hip replacement. IMPRESSION:  1. There has been a decrease in stool in the rectum and left colon since the previous day's CT scan with persistent increased stool noted in the right colon. 2. No acute findings.  No evidence of bowel obstruction. Electronically Signed   By: Lajean Manes M.D.   On: 12/22/2016 07:45     CBC  Recent Labs Lab 12/21/16 1154 12/22/16 0806 12/23/16 0218 12/24/16 0246  WBC 27.0* 36.6* 34.5* 27.6*  HGB 9.9* 9.7* 8.6* 8.3*  HCT 30.0* 28.9* 26.7* 26.1*  PLT 730* 692* 683* 696*  MCV 83.6 83.3 84.0 84.7  MCH 27.6 28.0 27.0 26.9  MCHC 33.0 33.6 32.2 31.8  RDW 14.7 15.0 15.4 15.6*  LYMPHSABS  --   --  3.1 3.0  MONOABS  --   --  2.8* 3.3*  EOSABS  --   --  0.0 0.3  BASOSABS  --   --  0.0 0.0    Chemistries   Recent Labs Lab 12/21/16 1154 12/22/16 0806 12/22/16 1147 12/23/16 0218 12/24/16 0246  NA 135 136  --  137 132*  K 3.6 3.2*  --  4.1 4.3  CL 95* 102  --  106 105  CO2 20* 19*  --  19* 17*  GLUCOSE 157* 152*  --  109* 105*  BUN 23* 17  --  19 20  CREATININE 1.81* 1.45*  --  1.42* 1.26*  CALCIUM 9.4 8.3*  --  8.4* 8.2*  MG  --   --  2.2  --  2.5*  AST 22  --   --  23  --   ALT 14  --   --  13*  --   ALKPHOS 71  --   --   65  --   BILITOT 1.0  --   --  0.9  --    ------------------------------------------------------------------------------------------------------------------ estimated creatinine clearance is 33.6 mL/min (by C-G formula based on SCr of 1.26 mg/dL (H)). ------------------------------------------------------------------------------------------------------------------ No results for input(s): HGBA1C in the last 72 hours. ------------------------------------------------------------------------------------------------------------------ No results for input(s): CHOL, HDL, LDLCALC, TRIG, CHOLHDL, LDLDIRECT in the last 72 hours. ------------------------------------------------------------------------------------------------------------------  Recent Labs  12/22/16 0806  TSH 1.224   ------------------------------------------------------------------------------------------------------------------ No results for input(s): VITAMINB12, FOLATE, FERRITIN, TIBC, IRON, RETICCTPCT in the last 72 hours.  Coagulation profile  Recent Labs Lab 12/21/16 1154  INR 1.12     Recent Labs  12/22/16 0806  DDIMER 4.98*    Cardiac Enzymes  Recent Labs Lab 12/22/16 0806 12/22/16 1147 12/22/16 1836  TROPONINI 0.29* 0.36* 0.31*   ------------------------------------------------------------------------------------------------------------------ Invalid input(s): POCBNP   CBG:  Recent Labs Lab 12/22/16 1952  GLUCAP 143*       Studies: Ct Head Wo Contrast  Result Date: 12/22/2016 CLINICAL DATA:  Altered mental status, history diabetes mellitus, dysrhythmia, hypertension, former smoker, asthma EXAM: CT HEAD WITHOUT CONTRAST TECHNIQUE: Contiguous axial images were obtained from the base of the skull through the vertex without intravenous contrast. COMPARISON:  11/24/2011 FINDINGS: Brain: Generalized atrophy. Normal ventricular morphology. No midline shift or mass effect. Small vessel chronic  ischemic changes of deep cerebral white matter. No intracranial hemorrhage, mass lesion, evidence of acute infarction, or extra-axial fluid collection. Vascular: Mild atherosclerotic calcifications at the carotid siphons Skull: Intact Sinuses/Orbits: Clear Other: N/A IMPRESSION: Atrophy with small vessel chronic ischemic changes of deep cerebral white matter. No acute intracranial abnormalities. Electronically Signed   By: Lavonia Dana M.D.   On: 12/22/2016 16:41   US Renal  Result Date: 12/23/2016 CLINICAL DATA:  Patient  with elevated creatinine. EXAM: RENAL / URINARY TRACT ULTRASOUND COMPLETE COMPARISON:  CT abdomen pelvis 12/21/2016 FINDINGS: Right Kidney: Length: 9.7 cm. Diffusely increased in echogenicity. Normal cortical thickness. No hydronephrosis. Left Kidney: Length: 8.9 cm. Diffusely increased in echogenicity. Normal cortical thickness. No hydronephrosis. Bladder: Appears normal for degree of bladder distention. IMPRESSION: No hydronephrosis. Diffusely increased renal cortical echogenicity as can be seen with chronic medical renal disease. Electronically Signed   By: Lovey Newcomer M.D.   On: 12/23/2016 19:46      Lab Results  Component Value Date   HGBA1C 6.0 (H) 12/10/2016   HGBA1C 6.0 (H) 12/10/2016   HGBA1C 5.6 08/27/2016   Lab Results  Component Value Date   CREATININE 1.26 (H) 12/24/2016       Scheduled Meds: . atorvastatin  80 mg Oral q1800  . ceFEPime (MAXIPIME) IV  1 g Intravenous Q24H  . dabigatran  150 mg Oral QODAY  . diltiazem  240 mg Oral Daily  . milk and molasses  1 enema Rectal Once  . pantoprazole  40 mg Oral Daily  . rOPINIRole  0.5 mg Oral QHS  . sodium chloride  250 mL Intravenous Once  . sodium chloride flush  3 mL Intravenous Q12H  . vancomycin  750 mg Intravenous Q24H   Continuous Infusions: . 0.9 % NaCl with KCl 40 mEq / L 75 mL/hr at 12/23/16 0400     LOS: 2 days    Time spent: >30 MINS    [facility]  [facility] Pager [phone].  If 7PM-7AM, please contact night-coverage at [email or web address], password [facility] 12/24/2016, 10:11 AM  LOS: 2 days

## 2016-12-25 DIAGNOSIS — D649 Anemia, unspecified: Secondary | ICD-10-CM

## 2016-12-25 DIAGNOSIS — R4182 Altered mental status, unspecified: Secondary | ICD-10-CM

## 2016-12-25 DIAGNOSIS — R1114 Bilious vomiting: Secondary | ICD-10-CM

## 2016-12-25 DIAGNOSIS — K920 Hematemesis: Secondary | ICD-10-CM

## 2016-12-25 DIAGNOSIS — R400 Somnolence: Secondary | ICD-10-CM

## 2016-12-25 LAB — COMPREHENSIVE METABOLIC PANEL
ALK PHOS: 57 U/L (ref 38–126)
ALT: 11 U/L — AB (ref 14–54)
AST: 16 U/L (ref 15–41)
Albumin: 1.9 g/dL — ABNORMAL LOW (ref 3.5–5.0)
Anion gap: 9 (ref 5–15)
BUN: 16 mg/dL (ref 6–20)
CALCIUM: 8.1 mg/dL — AB (ref 8.9–10.3)
CHLORIDE: 104 mmol/L (ref 101–111)
CO2: 18 mmol/L — AB (ref 22–32)
CREATININE: 1.19 mg/dL — AB (ref 0.44–1.00)
GFR calc Af Amer: 50 mL/min — ABNORMAL LOW (ref >60)
GFR, EST NON AFRICAN AMERICAN: 43 mL/min — AB (ref >60)
Glucose, Bld: 98 mg/dL (ref 65–99)
Potassium: 4.5 mmol/L (ref 3.5–5.1)
SODIUM: 131 mmol/L — AB (ref 135–145)
Total Bilirubin: 0.6 mg/dL (ref 0.3–1.2)
Total Protein: 5.7 g/dL — ABNORMAL LOW (ref 6.5–8.1)

## 2016-12-25 LAB — URINE CULTURE: Culture: NO GROWTH

## 2016-12-25 LAB — DIFFERENTIAL
BASOS PCT: 0 %
Basophils Absolute: 0 10*3/uL (ref 0.0–0.1)
Eosinophils Absolute: 0.3 10*3/uL (ref 0.0–0.7)
Eosinophils Relative: 2 %
LYMPHS ABS: 3.7 10*3/uL (ref 0.7–4.0)
Lymphocytes Relative: 21 %
MONO ABS: 2 10*3/uL — AB (ref 0.1–1.0)
MONOS PCT: 11 %
NEUTROS PCT: 66 %
Neutro Abs: 11.8 10*3/uL — ABNORMAL HIGH (ref 1.7–7.7)

## 2016-12-25 LAB — PREPARE RBC (CROSSMATCH)

## 2016-12-25 LAB — CBC
HCT: 23.9 % — ABNORMAL LOW (ref 36.0–46.0)
Hemoglobin: 7.4 g/dL — ABNORMAL LOW (ref 12.0–15.0)
MCH: 26.2 pg (ref 26.0–34.0)
MCHC: 31 g/dL (ref 30.0–36.0)
MCV: 84.8 fL (ref 78.0–100.0)
Platelets: 559 10*3/uL — ABNORMAL HIGH (ref 150–400)
RBC: 2.82 MIL/uL — ABNORMAL LOW (ref 3.87–5.11)
RDW: 15.5 % (ref 11.5–15.5)
WBC: 17.9 10*3/uL — ABNORMAL HIGH (ref 4.0–10.5)

## 2016-12-25 MED ORDER — SODIUM CHLORIDE 0.9 % IV SOLN
Freq: Once | INTRAVENOUS | Status: AC
  Administered 2016-12-25: 12:00:00 via INTRAVENOUS

## 2016-12-25 MED ORDER — PANTOPRAZOLE SODIUM 40 MG IV SOLR
40.0000 mg | Freq: Two times a day (BID) | INTRAVENOUS | Status: DC
  Administered 2016-12-25 – 2016-12-26 (×2): 40 mg via INTRAVENOUS
  Filled 2016-12-25 (×2): qty 40

## 2016-12-25 MED ORDER — FUROSEMIDE 10 MG/ML IJ SOLN
60.0000 mg | Freq: Once | INTRAMUSCULAR | Status: AC
  Administered 2016-12-25: 60 mg via INTRAVENOUS
  Filled 2016-12-25: qty 6

## 2016-12-25 NOTE — Progress Notes (Addendum)
[facility] PROGRESS NOTE  Gloria Best OVZ:[id number] DOB: 1941/05/22 DOA: 12/21/2016   PCP: Marco Collie, MD     Assessment/Plan: Active Problems:   Elevated WBC count   Hematemesis with nausea   Acute renal failure (ARF) ([place])   Altered mental status   Gloria Best is a 76 y.o. female  past mental history significant for of anemia, anxiety, asthma, bladder disorder, recurrent UTIs, chronic kidney disease, diabetes MR Roger, reflux, hypertension, pneumonia, sleep apnea presents to emergency room with chief complaint hematemesis from her nursing facility. Per report from EDP patient was sent over for multiple episodes of emesis earlier today. One sample was checked and was positive for blood.  . EDP recommended consulting GI for upper GI bleed. Patient has had multiple normal bowel movements in the emergency room. CT of the abdomen and pelvis showed significant constipation.      Assessment and plan Sepsis, likely secondary to urinary source, patient has had a indwelling Foley since surgery White blood cell count significantly elevated on admission-likely leukemoid reaction, slowly improving Follow blood culture 2, urine culture pending Discontinued Foley, CT abdomen pelvis showed Large amount of stool seen throughout the colon and rectum concerning for rectal impaction  Chest x-ray negative GI panel to rule out gastroenteritis as the patient has had nausea with vomiting 2 days was negative Continue zosyn, dc vanc    Urinary retention Renal ultrasound-no urinary retention, no hydronephrosis,Diffusely increased renal cortical echogenicity as can be seen with chronic medical renal disease. Foley discontinued Hold MYBETRIQ,ditropan as both can cause retention, reviewed meds with pharmacy Will order PVR , in and out cath Started on flomax  Hematemesis, anemia  patient denies any GI bleeding,  baseline between 9-10, hemoglobin now 7.4, no signs of active  bleeding for several days   FOBT positive UA negative for blood We will transfuse 1 unit given borderline elevated troponin, keep hemoglobin greater than 8.0 May need GI evaluation to investigate anemia further, called [facility], will switch to clears.IV PPI, hold pradaxa  Suspect she could have an ulcer , and warrants EGD     SVT/atrial fibrillation, in the setting of sepsis Sustained for severeal hours , now  With  IV metoprolol, magnesium,K   Repletion cardiac enzymes borderline,   2-D echo to rule out wall motion abnormalities, none seen May need ischemic workup in the future Continue Cardizem, did not need IV Cardizem as the patient spontaneously converted after repletion of electrolytes Continue Pradaxa Venous Doppler negative for DVT Low threshold to rule out PE  Leukocytosis Bandemia, left shift secondary to infection Improving   Hypertension Cont cardizem Resume Lasix   Diabetes Most recent hemoglobin A1c was 6 Continue current regimen  Hypokalemia-repleted  GERD Cont PPI/H2 blocker  Anxiety/depression Resume lexapro   HTN Hypertensive continue current meds    Constipation, 6 bowel movements since admission overnight Cleaned rectum WITH combination of suppositories and enemas daily MiraLAX  Holding constipating meds  Hyperlipidemia Continue statin   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  Does not appear to be have a septic joint    DVT prophylaxsis Pradaxa  Code Status:  Full code     Family Communication: Discussed in detail with the patient, all imaging results, lab results explained to the patient   Disposition Plan:  Continue tele, dc in 1-2 [facility] with [facility]      Consultants:  None  Procedures:  None  Antibiotics: Anti-infectives    Start  Dose/Rate Route Frequency Ordered Stop   12/23/16 1300  vancomycin (VANCOCIN) IVPB 750 mg/150 ml premix  Status:  Discontinued     750 mg 150 mL/hr over 60 Minutes  Intravenous Every 24 hours 12/22/16 1216 12/24/16 1637   12/22/16 1530  ceFEPIme (MAXIPIME) 1 g in dextrose 5 % 50 mL IVPB     1 g 100 mL/hr over 30 Minutes Intravenous Every 24 hours 12/22/16 1216     12/22/16 1230  vancomycin (VANCOCIN) 1,250 mg in sodium chloride 0.9 % 250 mL IVPB     1,250 mg 166.7 mL/hr over 90 Minutes Intravenous  Once 12/22/16 1216 12/22/16 1609   12/21/16 2315  hydroxychloroquine (PLAQUENIL) tablet 200 mg  Status:  Discontinued     200 mg Oral 2 times daily 12/21/16 1857 12/22/16 1036   12/21/16 2300  piperacillin-tazobactam (ZOSYN) IVPB 3.375 g  Status:  Discontinued     3.375 g 12.5 mL/hr over 240 Minutes Intravenous Every 8 hours 12/21/16 1638 12/22/16 1216   12/21/16 1645  piperacillin-tazobactam (ZOSYN) IVPB 3.375 g  Status:  Discontinued     3.375 g 100 mL/hr over 30 Minutes Intravenous Every 8 hours 12/21/16 1630 12/21/16 1636   12/21/16 1645  piperacillin-tazobactam (ZOSYN) IVPB 3.375 g     3.375 g 100 mL/hr over 30 Minutes Intravenous  Once 12/21/16 1638 12/22/16 0700         HPI/Subjective: NAUSEOUS TODAY, DOES NOT FEEL WELL  Objective: Vitals:   12/24/16 1400 12/24/16 1604 12/24/16 2006 12/25/16 0605  BP:  (!) 150/63 (!) 140/57 (!) 139/54  Pulse: 81 85 80 66  Resp: (!) 30 19 (!) 22 18  Temp:   98.2 F (36.8 C) 97.6 F (36.4 C)  TempSrc:   Oral Oral  SpO2: 96% 94% 98% 96%  Weight:    72.6 kg (160 lb 0.9 oz)  Height:        Intake/Output Summary (Last 24 hours) at 12/25/16 0902 Last data filed at 12/25/16 0500  Gross per 24 hour  Intake              315 ml  Output             1650 ml  Net            -1335 ml    Exam:  Examination:  General exam: Appears calm and comfortable  Respiratory system: Clear to auscultation. Respiratory effort normal. Cardiovascular system: S1 & S2 heard, RRR. No JVD, murmurs, rubs, gallops or clicks. No pedal edema. Gastrointestinal system: Abdomen is nondistended, soft and nontender. No organomegaly  or masses felt. Normal bowel sounds heard. Central nervous system: Alert and oriented. No focal neurological deficits. Extremities: right knee dressing intact, knee not warm Skin: No rashes, lesions or ulcers Psychiatry: Judgement and insight appear normal. Mood & affect appropriate.     Data Reviewed: I have personally reviewed following labs and imaging studies  Micro Results Recent Results (from the past 240 hour(s))  Culture, blood (routine x 2)     Status: None (Preliminary result)   Collection Time: 12/21/16  4:36 PM  Result Value Ref Range Status   Specimen Description BLOOD RIGHT HAND  Final   Special Requests BOTTLES DRAWN AEROBIC AND ANAEROBIC 5CC  Final   Culture NO GROWTH 3 DAYS  Final   Report Status PENDING  Incomplete  Culture, blood (routine x 2)     Status: None (Preliminary result)   Collection Time: 12/21/16  4:36 PM  Result  Value Ref Range Status   Specimen Description BLOOD LEFT HAND  Final   Special Requests AEROBIC BOTTLE ONLY  5CC  Final   Culture NO GROWTH 3 DAYS  Final   Report Status PENDING  Incomplete  MRSA PCR Screening     Status: None   Collection Time: 12/21/16  7:01 PM  Result Value Ref Range Status   MRSA by PCR NEGATIVE NEGATIVE Final    Comment:        The GeneXpert MRSA Assay (FDA approved for NASAL specimens only), is one component of a comprehensive MRSA colonization surveillance program. It is not intended to diagnose MRSA infection nor to guide or monitor treatment for MRSA infections.   Gastrointestinal Panel by PCR , Stool     Status: None   Collection Time: 12/22/16 11:44 AM  Result Value Ref Range Status   Campylobacter species NOT DETECTED NOT DETECTED Final   Plesimonas shigelloides NOT DETECTED NOT DETECTED Final   Salmonella species NOT DETECTED NOT DETECTED Final   Yersinia enterocolitica NOT DETECTED NOT DETECTED Final   Vibrio species NOT DETECTED NOT DETECTED Final   Vibrio cholerae NOT DETECTED NOT DETECTED Final    Enteroaggregative E coli (EAEC) NOT DETECTED NOT DETECTED Final   Enteropathogenic E coli (EPEC) NOT DETECTED NOT DETECTED Final   Enterotoxigenic E coli (ETEC) NOT DETECTED NOT DETECTED Final   Shiga like toxin producing E coli (STEC) NOT DETECTED NOT DETECTED Final   Shigella/Enteroinvasive E coli (EIEC) NOT DETECTED NOT DETECTED Final   Cryptosporidium NOT DETECTED NOT DETECTED Final   Cyclospora cayetanensis NOT DETECTED NOT DETECTED Final   Entamoeba histolytica NOT DETECTED NOT DETECTED Final   Giardia lamblia NOT DETECTED NOT DETECTED Final   Adenovirus F40/41 NOT DETECTED NOT DETECTED Final   Astrovirus NOT DETECTED NOT DETECTED Final   Norovirus GI/GII NOT DETECTED NOT DETECTED Final   Rotavirus A NOT DETECTED NOT DETECTED Final   Sapovirus (I, II, IV, and V) NOT DETECTED NOT DETECTED Final    Radiology Reports Ct Abdomen Pelvis Wo Contrast  Result Date: 12/21/2016 CLINICAL DATA:  Acute mid abdominal pain. EXAM: CT ABDOMEN AND PELVIS WITHOUT CONTRAST TECHNIQUE: Multidetector CT imaging of the abdomen and pelvis was performed following the standard protocol without IV contrast. COMPARISON:  CT scan of September 18, 2014. FINDINGS: Lower chest: Mild bilateral posterior basilar subsegmental atelectasis is noted. Hepatobiliary: No focal liver abnormality is seen. No gallstones, gallbladder wall thickening, or biliary dilatation. Pancreas: Unremarkable. No pancreatic ductal dilatation or surrounding inflammatory changes. Spleen: Normal in size without focal abnormality. Adrenals/Urinary Tract: Adrenal glands appear normal. No hydronephrosis or renal obstruction is noted. No renal or ureteral calculi are noted. Urinary bladder is decompressed secondary to Foley catheter. Stomach/Bowel: Large amount of stool is noted throughout the colon and rectum concerning for impaction. No small bowel dilatation is noted. Vascular/Lymphatic: Aortic atherosclerosis. No enlarged abdominal or pelvic lymph  nodes. Reproductive: Status post hysterectomy. No adnexal masses. Other: No abdominal wall hernia or abnormality. No abdominopelvic ascites. Musculoskeletal: Status post right hip arthroplasty. Status post surgical fusion of lower lumbar spine. IMPRESSION: Aortic atherosclerosis. Large amount of stool seen throughout the colon and rectum concerning for rectal impaction. No other significant abnormality seen in the abdomen or pelvis. Electronically Signed   By: Marijo Conception, M.D.   On: 12/21/2016 14:56   Ct Head Wo Contrast  Result Date: 12/22/2016 CLINICAL DATA:  Altered mental status, history diabetes mellitus, dysrhythmia, hypertension, former smoker, asthma EXAM: CT HEAD  WITHOUT CONTRAST TECHNIQUE: Contiguous axial images were obtained from the base of the skull through the vertex without intravenous contrast. COMPARISON:  11/24/2011 FINDINGS: Brain: Generalized atrophy. Normal ventricular morphology. No midline shift or mass effect. Small vessel chronic ischemic changes of deep cerebral white matter. No intracranial hemorrhage, mass lesion, evidence of acute infarction, or extra-axial fluid collection. Vascular: Mild atherosclerotic calcifications at the carotid siphons Skull: Intact Sinuses/Orbits: Clear Other: N/A IMPRESSION: Atrophy with small vessel chronic ischemic changes of deep cerebral white matter. No acute intracranial abnormalities. Electronically Signed   By: Lavonia Dana M.D.   On: 12/22/2016 16:41   US Renal  Result Date: 12/23/2016 CLINICAL DATA:  Patient with elevated creatinine. EXAM: RENAL / URINARY TRACT ULTRASOUND COMPLETE COMPARISON:  CT abdomen pelvis 12/21/2016 FINDINGS: Right Kidney: Length: 9.7 cm. Diffusely increased in echogenicity. Normal cortical thickness. No hydronephrosis. Left Kidney: Length: 8.9 cm. Diffusely increased in echogenicity. Normal cortical thickness. No hydronephrosis. Bladder: Appears normal for degree of bladder distention. IMPRESSION: No hydronephrosis.  Diffusely increased renal cortical echogenicity as can be seen with chronic medical renal disease. Electronically Signed   By: Lovey Newcomer M.D.   On: 12/23/2016 19:46   Portable Chest 1 View  Result Date: 12/22/2016 CLINICAL DATA:  Elevated white blood cell count. No shortness of breath. History of diabetes and hypertension. EXAM: PORTABLE CHEST 1 VIEW COMPARISON:  12/21/2016. FINDINGS: Cardiac silhouette is normal in size. No mediastinal or hilar masses. No convincing adenopathy. Mild increased bronchovascular markings are noted bilaterally most evident at the medial lung bases. There is likely a component of atelectasis at the medial lung bases. No convincing pneumonia. No pulmonary edema. No pleural effusion or pneumothorax. Skeletal structures are intact. IMPRESSION: No acute cardiopulmonary disease. Electronically Signed   By: Lajean Manes M.D.   On: 12/22/2016 07:46   Dg Chest Portable 1 View  Result Date: 12/21/2016 CLINICAL DATA:  Cough, vomiting and abdominal pain. EXAM: PORTABLE CHEST 1 VIEW COMPARISON:  12/10/2016 FINDINGS: The heart is normal in size. The mediastinal and hilar contours are stable. Mild tortuosity of the thoracic aorta. The lungs are clear. No pleural effusion. The right IJ catheter has been removed. The bony thorax is intact. IMPRESSION: No acute cardiopulmonary findings. Electronically Signed   By: Marijo Sanes M.D.   On: 12/21/2016 12:21   Dg Chest Port 1 View  Result Date: 12/10/2016 CLINICAL DATA:  Status post central venous catheter placement prior to knee surgery. EXAM: PORTABLE CHEST 1 VIEW COMPARISON:  PA and lateral chest x-ray of September 10, 2016 FINDINGS: The lungs are well-expanded. There is no pneumothorax or pleural effusion. The interstitial markings are mildly prominent though stable. The cardiac silhouette is top-normal in size. The pulmonary vascularity is normal. There is calcification in the wall of the aortic arch. The right internal jugular venous  catheter tip projects over the junction of the middle and distal thirds of the SVC. The observed bony thorax is unremarkable. IMPRESSION: There is no immediate postprocedure complication following placement of the right internal jugular venous catheter. The tip of the catheter projects at the junction of the middle and distal SVC. Mild chronic bronchitic changes. Thoracic aortic atherosclerosis. Electronically Signed   By: David  Martinique M.D.   On: 12/10/2016 11:39   Dg Abd Portable 1v  Result Date: 12/22/2016 CLINICAL DATA:  Last normal bowel movement 2 weeks ago. Constipation. EXAM: PORTABLE ABDOMEN - 1 VIEW COMPARISON:  CT, 12/21/2016. FINDINGS: There is no cough dilation to suggest obstruction or significant adynamic  ileus. There has been an interval decrease in stool in the rectum and left colon. There is still mild increased colonic stool in the right colon. No evidence of renal or ureteral stones. Soft tissues are unremarkable. Stable changes from previous lumbar spine fusion and right hip replacement. IMPRESSION: 1. There has been a decrease in stool in the rectum and left colon since the previous day's CT scan with persistent increased stool noted in the right colon. 2. No acute findings.  No evidence of bowel obstruction. Electronically Signed   By: Lajean Manes M.D.   On: 12/22/2016 07:45     CBC  Recent Labs Lab 12/21/16 1154 12/22/16 0806 12/23/16 0218 12/24/16 0246 12/25/16 0700  WBC 27.0* 36.6* 34.5* 27.6* 17.9*  HGB 9.9* 9.7* 8.6* 8.3* 7.4*  HCT 30.0* 28.9* 26.7* 26.1* 23.9*  PLT 730* 692* 683* 696* 559*  MCV 83.6 83.3 84.0 84.7 84.8  MCH 27.6 28.0 27.0 26.9 26.2  MCHC 33.0 33.6 32.2 31.8 31.0  RDW 14.7 15.0 15.4 15.6* 15.5  LYMPHSABS  --   --  3.1 3.0 3.7  MONOABS  --   --  2.8* 3.3* 2.0*  EOSABS  --   --  0.0 0.3 0.3  BASOSABS  --   --  0.0 0.0 0.0    Chemistries   Recent Labs Lab 12/21/16 1154 12/22/16 0806 12/22/16 1147 12/23/16 0218 12/24/16 0246  12/25/16 0700  NA 135 136  --  137 132* 131*  K 3.6 3.2*  --  4.1 4.3 4.5  CL 95* 102  --  106 105 104  CO2 20* 19*  --  19* 17* 18*  GLUCOSE 157* 152*  --  109* 105* 98  BUN 23* 17  --  19 20 16   CREATININE 1.81* 1.45*  --  1.42* 1.26* 1.19*  CALCIUM 9.4 8.3*  --  8.4* 8.2* 8.1*  MG  --   --  2.2  --  2.5*  --   AST 22  --   --  23  --  16  ALT 14  --   --  13*  --  11*  ALKPHOS 71  --   --  65  --  57  BILITOT 1.0  --   --  0.9  --  0.6   ------------------------------------------------------------------------------------------------------------------ estimated creatinine clearance is 35.7 mL/min (by C-G formula based on SCr of 1.19 mg/dL (H)). ------------------------------------------------------------------------------------------------------------------ No results for input(s): HGBA1C in the last 72 hours. ------------------------------------------------------------------------------------------------------------------ No results for input(s): CHOL, HDL, LDLCALC, TRIG, CHOLHDL, LDLDIRECT in the last 72 hours. ------------------------------------------------------------------------------------------------------------------ No results for input(s): TSH, T4TOTAL, T3FREE, THYROIDAB in the last 72 hours.  Invalid input(s): FREET3 ------------------------------------------------------------------------------------------------------------------ No results for input(s): VITAMINB12, FOLATE, FERRITIN, TIBC, IRON, RETICCTPCT in the last 72 hours.  Coagulation profile  Recent Labs Lab 12/21/16 1154  INR 1.12    No results for input(s): DDIMER in the last 72 hours.  Cardiac Enzymes  Recent Labs Lab 12/22/16 0806 12/22/16 1147 12/22/16 1836  TROPONINI 0.29* 0.36* 0.31*   ------------------------------------------------------------------------------------------------------------------ Invalid input(s): POCBNP   CBG:  Recent Labs Lab 12/22/16 1952  GLUCAP 143*        Studies: US Renal  Result Date: 12/23/2016 CLINICAL DATA:  Patient with elevated creatinine. EXAM: RENAL / URINARY TRACT ULTRASOUND COMPLETE COMPARISON:  CT abdomen pelvis 12/21/2016 FINDINGS: Right Kidney: Length: 9.7 cm. Diffusely increased in echogenicity. Normal cortical thickness. No hydronephrosis. Left Kidney: Length: 8.9 cm. Diffusely increased in echogenicity. Normal cortical thickness. No hydronephrosis. Bladder:  Appears normal for degree of bladder distention. IMPRESSION: No hydronephrosis. Diffusely increased renal cortical echogenicity as can be seen with chronic medical renal disease. Electronically Signed   By: Lovey Newcomer M.D.   On: 12/23/2016 19:46      Lab Results  Component Value Date   HGBA1C 6.0 (H) 12/10/2016   HGBA1C 6.0 (H) 12/10/2016   HGBA1C 5.6 08/27/2016   Lab Results  Component Value Date   CREATININE 1.19 (H) 12/25/2016       Scheduled Meds: . sodium chloride   Intravenous Once  . atorvastatin  80 mg Oral q1800  . ceFEPime (MAXIPIME) IV  1 g Intravenous Q24H  . dabigatran  150 mg Oral QODAY  . diltiazem  240 mg Oral Daily  . escitalopram  10 mg Oral Daily  . furosemide  60 mg Intravenous Once  . milk and molasses  1 enema Rectal Once  . mirabegron ER  50 mg Oral Daily  . pantoprazole  40 mg Oral Daily  . polyethylene glycol  17 g Oral Daily  . pregabalin  50 mg Oral BID  . rOPINIRole  0.5 mg Oral QHS  . sodium chloride  250 mL Intravenous Once  . sodium chloride flush  3 mL Intravenous Q12H  . tamsulosin  0.4 mg Oral QPC breakfast   Continuous Infusions:    LOS: 3 days    Time spent: >30 MINS    [facility]  [facility] Pager [phone]. If 7PM-7AM, please contact night-coverage at [email or web address], password  [facility] 12/25/2016, 9:02 AM  LOS: 3 days

## 2016-12-25 NOTE — Progress Notes (Signed)
Pt's oxygen sats down to 80% while sleeping. Placed 2LNC on patient. Sats up to 92%.

## 2016-12-25 NOTE — Evaluation (Signed)
Occupational Therapy Evaluation Patient Details Name: Gloria Best MRN: [id number] DOB: [birth year] Today's Date: 12/25/2016    History of Present Illness 76 y.o. 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   Clinical Impression   Pt reports she has required assist for ADL since recent TKA on 2/5. Currently pt requires max assist for LB ADL and min guard for seated UB ADL. Pt able to perform sit to stand from EOB with min hand held assist and take a few side steps toward [facility] for repositioning. Pt currently receiving blood transfusion and reporting mild dizziness with positional changes; limited activity this session. Pt planning to d/c home with 24/7 supervision from family. Recommending HHOT for follow up to maximize independence and safety with ADL and functional mobility upon return home. Pt would benefit from continued skilled OT to address established goals.    Follow Up Recommendations  Home health OT;Supervision/Assistance - 24 hour    Equipment Recommendations  None recommended by OT    Recommendations for Other Services       Precautions / Restrictions Precautions Precautions: Knee;Fall Restrictions Weight Bearing Restrictions: Yes RLE Weight Bearing: Weight bearing as tolerated      Mobility Bed Mobility Overal bed mobility: Needs Assistance Bed Mobility: Supine to Sit;Sit to Supine     Supine to sit: Min guard Sit to supine: Min guard   General bed mobility comments: Min guard for safety. HOB flat with use of bed rails. Increased time required  Transfers Overall transfer level: Needs assistance Equipment used: 1 person hand held assist Transfers: Sit to/from Stand Sit to Stand: Min assist         General transfer comment: Min hand held assist  for sit to stand from EOB    Balance Overall balance assessment: Needs assistance Sitting-balance support: Feet supported Sitting balance-Leahy Scale: Good     Standing balance support: Single extremity supported Standing balance-Leahy Scale: Fair                              ADL Overall ADL's : Needs assistance/impaired Eating/Feeding: Set up;Bed level   Grooming: Supervision/safety;Sitting   Upper Body Bathing: Min guard;Sitting   Lower Body Bathing: Maximal assistance;Sit to/from stand   Upper Body Dressing : Min guard;Sitting   Lower Body Dressing: Maximal assistance;Sit to/from stand                 General ADL Comments: Min assist for sit to stand from EOB with small side steps toward [facility] for repositioning. Pt c/o dizziness initially when coming from supine>sit.     Vision         Perception     Praxis      Pertinent Vitals/Pain Pain Assessment: Faces Faces Pain Scale: Hurts even more Pain Location: R knee Pain Descriptors / Indicators: Sore Pain Intervention(s): Monitored during session     Hand Dominance Right   Extremity/Trunk Assessment Upper Extremity Assessment Upper Extremity Assessment: Overall WFL for tasks assessed   Lower Extremity Assessment Lower Extremity Assessment: Defer to PT evaluation   Cervical / Trunk Assessment Cervical / Trunk Assessment: Kyphotic   Communication Communication Communication: No difficulties   Cognition Arousal/Alertness: Awake/alert Behavior During Therapy: WFL for tasks assessed/performed Overall Cognitive Status: Within Functional Limits for tasks assessed  General Comments       Exercises       Shoulder Instructions      Home Living Family/patient expects to be discharged to:: Private residence Living Arrangements: Children;Non-relatives/Friends (daughter and grandson) Available Help at Discharge: Family;Available 24 hours/day Type of Home:  House Home Access: Level entry     Home Layout: One level     Bathroom Shower/Tub: Occupational psychologist: Standard     Home Equipment: Environmental consultant - 4 wheels;Bedside commode;Hand held shower head;Shower seat;Walker - 2 wheels          Prior Functioning/Environment Level of Independence: Needs assistance        Comments: Prior to knee surgery 2/5, pt Mod I with ADLs, has required assist at SNF for ADLs since surgery.        OT Problem List: Decreased strength;Decreased activity tolerance;Impaired balance (sitting and/or standing);Decreased knowledge of use of DME or AE;Cardiopulmonary status limiting activity;Pain      OT Treatment/Interventions: Self-care/ADL training;Energy conservation;DME and/or AE instruction;Therapeutic activities;Patient/family education;Balance training    OT Goals(Current goals can be found in the care plan section) Acute Rehab OT Goals Patient Stated Goal: return home OT Goal Formulation: With patient Time For Goal Achievement: 01/08/17 Potential to Achieve Goals: Good ADL Goals Pt Will Perform Grooming: with supervision;standing Pt Will Perform Lower Body Bathing: with supervision;sit to/from stand (with or without AE) Pt Will Perform Lower Body Dressing: with supervision;sit to/from stand (with or without AE) Pt Will Transfer to Toilet: with supervision;ambulating;bedside commode (over toilet) Pt Will Perform Toileting - Clothing Manipulation and hygiene: with supervision;sit to/from stand Pt Will Perform Tub/Shower Transfer: Shower transfer;with supervision;ambulating;shower seat;rolling walker  OT Frequency: Min 2X/week   Barriers to D/C:            Co-evaluation              End of Session Equipment Utilized During Treatment: Oxygen Nurse Communication: Mobility status  Activity Tolerance: Patient tolerated treatment well Patient left: in bed;with call bell/phone within reach;with bed alarm set  OT Visit Diagnosis:  Unsteadiness on feet (R26.81);Other abnormalities of gait and mobility (R26.89);Muscle weakness (generalized) (M62.81);Pain Pain - Right/Left: Right Pain - part of body: Knee                ADL either performed or assessed with clinical judgement  Time: 1423-1437 OT Time Calculation (min): 14 min Charges:  OT General Charges $OT Visit: 1 Procedure OT Evaluation $OT Eval Moderate Complexity: 1 Procedure G-Codes:     Nichols Corter A. Ulice Brilliant, M.S., OTR/L Pager: [place] 12/25/2016, 2:46 PM

## 2016-12-25 NOTE — Evaluation (Signed)
Physical Therapy Evaluation Patient Details Name: Gloria FICEK MRN: [id number] DOB: [birth year] Today's Date: 12/25/2016   History of Present Illness  76 y.o. 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  Clinical Impression  Patient presents with generalized weakness, nausea, dyspnea on exertion with drop in Sp02 with activity, decreased activity tolerance and fatigue s/p above. Tolerated short distance ambulation with min A for balance/safety. Pt [facility] after TKR in early February but plans to d/c home with support of daughter and 8 y/o grandson. Reviewed exercises during session. Will follow acutely to maximize independence and mobility prior to return home.    Follow Up Recommendations Home health PT;Supervision for mobility/OOB;Supervision/Assistance - 24 hour    Equipment Recommendations  None recommended by PT    Recommendations for Other Services       Precautions / Restrictions Precautions Precautions: Knee;Fall Precaution Booklet Issued: No Restrictions Weight Bearing Restrictions: Yes RLE Weight Bearing: Weight bearing as tolerated      Mobility  Bed Mobility Overal bed mobility: Needs Assistance Bed Mobility: Supine to Sit     Supine to sit: Min guard;HOB elevated Sit to supine: Min guard   General bed mobility comments: Increased time to get to EOB. Use of rail for support.   Transfers Overall transfer level: Needs assistance Equipment used: Rolling walker (2 wheeled) Transfers: Sit to/from Stand Sit to Stand: Min assist         General transfer comment: Min A to stand from EOB with cues for hand placement. Stood from chair x3.  Ambulation/Gait Ambulation/Gait assistance: Min assist Ambulation Distance (Feet): 12  Feet (+ 10') Assistive device: Rolling walker (2 wheeled) Gait Pattern/deviations: Step-to pattern;Decreased stance time - right;Trunk flexed;Decreased weight shift to right;Step-through pattern Gait velocity: decreased   General Gait Details: Cues for knee extension during stance phase of gait. 1 seated rest break due to fatigue. Sp02 dropped to 80s on RA. Cues for pursed lip breathing.  Stairs            Wheelchair Mobility    Modified Rankin (Stroke Patients Only)       Balance Overall balance assessment: Needs assistance Sitting-balance support: Feet supported;No upper extremity supported Sitting balance-Leahy Scale: Good     Standing balance support: During functional activity;Bilateral upper extremity supported Standing balance-Leahy Scale: Poor Standing balance comment: using rw for support                             Pertinent Vitals/Pain Pain Assessment: 0-10 Pain Score: 8  Faces Pain Scale: Hurts even more Pain Location: R knee Pain Descriptors / Indicators: Sore Pain Intervention(s): Monitored during session;Repositioned    Home Living Family/patient expects to be discharged to:: Private residence Living Arrangements: Children;Non-relatives/Friends (daughter and grandson) Available Help at Discharge: Family;Available 24 hours/day Type of Home: House Home Access: Level entry     Home Layout: One level Home Equipment: Walker - 4 wheels;Bedside commode;Hand held shower head;Shower seat;Walker - 2 wheels Additional Comments: lives with daughter and 7 y.o. grandson. Daughter works but her grandson is always around.     Prior Function Level of Independence: Needs assistance         Comments: Prior to knee surgery 2/5, pt Mod I with ADLs, has required assist at [facility] for ADLs  since surgery.     Hand Dominance   Dominant Hand: Right    Extremity/Trunk Assessment   Upper Extremity Assessment Upper Extremity Assessment: Defer to OT  evaluation    Lower Extremity Assessment Lower Extremity Assessment: RLE deficits/detail RLE Deficits / Details: Limited AROM/strength after prior knee replacement.    Cervical / Trunk Assessment Cervical / Trunk Assessment: Kyphotic  Communication   Communication: No difficulties  Cognition Arousal/Alertness: Awake/alert Behavior During Therapy: WFL for tasks assessed/performed Overall Cognitive Status: Within Functional Limits for tasks assessed                      General Comments      Exercises Total Joint Exercises Ankle Circles/Pumps: Both;10 reps;Supine Quad Sets: Both;10 reps;Seated Long Arc Quad: Right;10 reps;Seated   Assessment/Plan    PT Assessment Patient needs continued PT services  PT Problem List Decreased strength;Decreased range of motion;Decreased activity tolerance;Decreased balance;Decreased mobility;Pain;Cardiopulmonary status limiting activity       PT Treatment Interventions DME instruction;Gait training;Functional mobility training;Therapeutic activities;Therapeutic exercise;Stair training;Balance training;Patient/family education    PT Goals (Current goals can be found in the Care Plan section)  Acute Rehab PT Goals Patient Stated Goal: return home PT Goal Formulation: With patient Time For Goal Achievement: 01/08/17 Potential to Achieve Goals: Good    Frequency Min 3X/week   Barriers to discharge        Co-evaluation               End of Session Equipment Utilized During Treatment: Gait belt Activity Tolerance: Patient limited by fatigue Patient left: in chair;with call bell/phone within reach Nurse Communication: Mobility status PT Visit Diagnosis: Unsteadiness on feet (R26.81);Muscle weakness (generalized) (M62.81)         Time: 1610 [phone] PT Time Calculation (min) (ACUTE ONLY): 15 min   Charges:   PT Evaluation $PT Eval Moderate Complexity: 1 Procedure     PT G Codes:         Sylvestre Rathgeber A  Alysiana Ethridge 12/25/2016, 5:13 PM Wray Kearns, [place], DPT [phone]

## 2016-12-25 NOTE — Progress Notes (Signed)
Pharmacy Antibiotic Note  SIRIAH TREAT is a 76 y.o. female admitted on 12/21/2016 with hematemsis.  Pharmacy was consulted for Vancomycin and Cefepime dosing for sepsis coverage likely secondary to urinary source.  Day # 5 antibiotics.  Vanc stopped on 2/19.  Continues on Cefepime. Afebrile, WBC down to 17.9.  Creatinine has trended down but Cefepime dose remains appropriate.  Foley out 2/17.  Plan:  Continue Cefepime 1 gram IV q24hrs.  Will follow renal function, culture data, progress, and antibiotic plans.  Height: 5' (152.4 cm) Weight: 160 lb 0.9 oz (72.6 kg) IBW/kg (Calculated) : 45.5  Temp (24hrs), Avg:98.1 F (36.7 C), Min:97.6 F (36.4 C), Max:98.5 F (36.9 C)   Recent Labs Lab 12/21/16 1154 12/21/16 1631 12/21/16 1859 12/22/16 0806 12/23/16 0218 12/24/16 0246 12/25/16 0700  WBC 27.0*  --   --  36.6* 34.5* 27.6* 17.9*  CREATININE 1.81*  --   --  1.45* 1.42* 1.26* 1.19*  LATICACIDVEN  --  1.2 2.0* 1.1  --   --   --     Estimated Creatinine Clearance: 35.7 mL/min (by C-G formula based on SCr of 1.19 mg/dL (H)).    Allergies  Allergen Reactions  . Adhesive [Tape] Other (See Comments)    Tears skin  . Latex Other (See Comments)    Blisters  . Lotrel [Amlodipine Besy-Benazepril Hcl] Itching and Cough    Antimicrobials this admission: Zosyn  2/16 >>2/17 Cefepime 2/17 >> Vancomycin 2/17 >> 2/19  Dose adjustments this admission:  n/a  Microbiology results: 2/16 Blood x 2 - ng x 4 days so far 2/16 MRSA: Negative  2/17 GI panel by PCR - negative 2/19 urine - negative  Thank you for allowing pharmacy to be a part of this patient's care.  [place], [place],  Pager: [phone] 12/25/2016 3:07 PM

## 2016-12-25 NOTE — Progress Notes (Signed)
PT Cancellation Note  Patient Details Name: Gloria Best MRN: [id number] DOB: 1941/03/05   Cancelled Treatment:    Reason Eval/Treat Not Completed: Other (comment) On first attempt, pt with nausea. IV zofran given. Attempted later in morning and pt still not feeling well. Will follow up as time allows.   Marguarite Arbour A Laelle Bridgett 12/25/2016, 12:39 PM Wray Kearns, [place], DPT [phone]

## 2016-12-25 NOTE — Consult Note (Signed)
[facility] Consult: 1:01 PM 12/25/2016  LOS: 3 days    Referring Provider: Dr Allyson Sabal  Primary Care Physician:  Marco Collie, MD in [place] Primary Gastroenterologist:  Althia Forts.  Dr Odie Sera in [place].      Reason for Consultation:  Anemia.  CG emesis.     HPI: Gloria Best is a 76 y.o. female.  PMH DM 2.  OSA, not on CPAP.  HLD. Fibromyalgia.  Neurogenic bladder. P Afib on Pradaxa ` 2 years.  RA on monthly Remicade, was due infusion 12/12/16 but due to TKR, infusion delayed for 3 weeks.  S/p right THR.  S/p HH repair ~ 2009 at [facility].  Anemia, transfused PRBC x 2 in 06/2012.   Colonoscopy and upper endoscopy probably around 2013, 2014. She recalls having had colon polyps. She is not sure if she is going to need another colonoscopy at the 5 year interval.   S/p Right TKR 12/11/15.  Admission 2/5 - 2/8.  Discharged to [facility] with indwelling foley on her chronic po iron, Pradaxa, Aciphex, Ranitidine.  Anemia with Hgb nadir of 7.9 on 2/8.  Was not transfused.   Readmitted 2/16 with AMS, urosepsis, SVT/afib, hypokalemia, constipation, n/v and reports of blood in emesis PTA.  Mental status improved following administration of Narcan. Had multiple BMs in the ED following glycerin suppository. Now on daily MiraLAX.Marland Kitchen   Hgb 9.9 .. 8.6.. 8.3.. 7.4.  Plan is for PRBC x 1 today. CT abdomen pelvis without contrast to/16 showed aortic afterload sclerosis, large volume stool throughout colon and rectum, question fecal impaction. No other abdominal/pelvic abnormality. CT of the head showed chronic ischemic changes, atrophy.  Started vomiting overnight on the day of coming to the ED. She said it was coffee ground appearing from the get go. She doesn't normally have problems with nausea or vomiting. Denies  dysphagia and history of esophageal dilatation or strictures. Along with the nausea and vomiting was burning in her epigastric region. Nausea persists though she hasn't had any emesis since admission.  Patient hasn't needed transfusion between the interval of today and 2013. She is compliant with taking her daily iron. Doesn't use NSAIDs.  Lately she had been having constipation and her appetite has been poor since the knee surgery Patient had 2-D echo performed today showing LV EF of 60-65%.  Mild to moderate aortic, mitral valve regurg. Moderately dilated left atrium.  Past Medical History:  Diagnosis Date  . Anemia    hx of blood transfusion  . Anxiety   . Arthritis    RA  . Asthma    hx of as child  . Bladder disorder    having difficulty voiding, requires in and out catheter 2x day and prn; denied current need for I&O cath 08/27/16  . Bronchitis    hx of  . Carpal tunnel syndrome of left wrist    hx of  . Chronic back pain   . Chronic kidney disease   . Depression    takes medication for   . Diabetes mellitus    oral  medication  . Dysrhythmia    takes cardizem, sees Dr. Marco Collie primary  . Fibromyalgia    takes lyrica  . GERD (gastroesophageal reflux disease)   . H/O hiatal hernia    hx of   . Heart murmur   . Hypertension   . Pneumonia    hx of  . Sleep apnea    no CPAP. last sleep study over 10 years ago  . Urinary tract infection    hx of  . Wears dentures    top    Past Surgical History:  Procedure Laterality Date  . ABDOMINAL HYSTERECTOMY    . APPENDECTOMY    . BACK SURGERY      x 3  . CARPAL TUNNEL RELEASE     left side only  . CATARACT EXTRACTION     bilateral  . CESAREAN SECTION    . COLON SURGERY     "due to large polyp"  . COLONOSCOPY    . FOOT SURGERY    . HERNIA REPAIR    . TENOLYSIS Left 11/12/2013   Procedure: LEFT TENDON GRAFT RECONSTRUCTION OF LEFT LONG FINGER SAGITTAL FIBERS CORD RELEASE;  Surgeon: Cammie Sickle., MD;  Location:  [facility];  Service: Orthopedics;  Laterality: Left;  . TOTAL HIP ARTHROPLASTY  06/09/2012   Procedure: TOTAL HIP ARTHROPLASTY;  Surgeon: [name], MD;  Location: [facility];  Service: Orthopedics;  Laterality: Right;  Right total hip replacement  . TOTAL KNEE ARTHROPLASTY Right 12/10/2016   Procedure: RIGHT TOTAL KNEE ARTHROPLASTY;  Surgeon: [name], MD;  Location: [facility];  Service: Orthopedics;  Laterality: Right;    Prior to Admission medications   Medication Sig Start Date End Date Taking? Authorizing Provider  acetaminophen (TYLENOL) 500 MG tablet Take 500 mg by mouth every 8 (eight) hours as needed (for pain).    Yes Historical Provider, MD  ARIPiprazole (ABILIFY) 5 MG tablet Take 5 mg by mouth every evening.    Yes Historical Provider, MD  atorvastatin (LIPITOR) 80 MG tablet Take 80 mg by mouth daily at 6 PM.  06/29/16  Yes Historical Provider, MD  cloNIDine (CATAPRES) 0.2 MG tablet Take 0.2 mg by mouth 2 (two) times daily. 08/12/16  Yes Historical Provider, MD  COMBIVENT RESPIMAT 20-100 MCG/ACT AERS respimat Inhale 1 puff into the lungs every 6 (six) hours as needed for wheezing or shortness of breath. For shortness of breath. 06/20/16  Yes Historical Provider, MD  dabigatran (PRADAXA) 150 MG CAPS Take 150 mg by mouth every other day. Stopped prior to procedure 11-16-16   Yes Historical Provider, MD  diclofenac sodium (VOLTAREN) 1 % GEL Apply 2 g topically 4 (four) times daily as needed. For pain. 07/18/16  Yes Historical Provider, MD  diltiazem (CARDIZEM CD) 240 MG 24 hr capsule Take 240 mg by mouth daily. 06/12/16  Yes Historical Provider, MD  doxycycline (VIBRA-TABS) 100 MG tablet Take 100 mg by mouth 2 (two) times daily.   Yes Historical Provider, MD  escitalopram (LEXAPRO) 10 MG tablet Take 10 mg by mouth daily.   Yes Historical Provider, MD  ferrous sulfate 325 (65 FE) MG tablet Take 1 tablet (325 mg total) by mouth 2 (two) times daily with a meal. 12/14/16  Yes Lanae Crumbly, PA-C  folic acid (FOLVITE) 1 MG tablet Take 1 mg by mouth every evening.    Yes Historical Provider, MD  furosemide (LASIX) 40 MG tablet Take 40 mg by mouth daily. 06/29/16  Yes Historical Provider, MD  gabapentin (NEURONTIN) 100 MG capsule Take 100 mg by mouth 2 (two) times daily.   Yes Historical Provider, MD  HYDROcodone-acetaminophen (NORCO/VICODIN) 5-325 MG tablet Take 1 tablet by mouth every 6 (six) hours as needed for moderate pain.   Yes Historical Provider, MD  hydroxychloroquine (PLAQUENIL) 200 MG tablet Take 200 mg by mouth 2 (two) times daily.  07/26/16  Yes Historical Provider, MD  hydroxypropyl methylcellulose / hypromellose (ISOPTO TEARS / GONIOVISC) 2.5 % ophthalmic solution Place 1-2 drops into both eyes 3 (three) times daily as needed for dry eyes.   Yes Historical Provider, MD  MYRBETRIQ 50 MG TB24 tablet Take 50 mg by mouth daily. 06/20/16  Yes Historical Provider, MD  oxybutynin (DITROPAN) 5 MG tablet Take 10 mg by mouth 3 (three) times daily.   Yes Historical Provider, MD  potassium chloride SA (K-DUR,KLOR-CON) 20 MEQ tablet Take 20 mEq by mouth daily.   Yes Historical Provider, MD  pramipexole (MIRAPEX) 0.5 MG tablet Take 0.5 mg by mouth at bedtime.   Yes Historical Provider, MD  pregabalin (LYRICA) 50 MG capsule Take 50 mg by mouth 2 (two) times daily.   Yes Historical Provider, MD  RABEprazole (ACIPHEX) 20 MG tablet Take 20 mg by mouth daily. 06/27/16  Yes Historical Provider, MD  ranitidine (ZANTAC) 300 MG tablet Take 300 mg by mouth daily at 2 PM. 07/12/16  Yes Historical Provider, MD  Vitamin D, Ergocalciferol, (DRISDOL) 50000 units CAPS capsule Take 50,000 Units by mouth every 30 (thirty) days. 07/12/16  Yes Historical Provider, MD    Scheduled Meds: . atorvastatin  80 mg Oral q1800  . ceFEPime (MAXIPIME) IV  1 g Intravenous Q24H  . diltiazem  240 mg Oral Daily  . escitalopram  10 mg Oral Daily  . furosemide  60 mg Intravenous Once  . milk and molasses  1 enema  Rectal Once  . mirabegron ER  50 mg Oral Daily  . pantoprazole (PROTONIX) IV  40 mg Intravenous Q12H  . polyethylene glycol  17 g Oral Daily  . pregabalin  50 mg Oral BID  . rOPINIRole  0.5 mg Oral QHS  . sodium chloride  250 mL Intravenous Once  . sodium chloride flush  3 mL Intravenous Q12H  . tamsulosin  0.4 mg Oral QPC breakfast   Infusions:  PRN Meds: acetaminophen **OR** acetaminophen, albuterol, hydrALAZINE, HYDROcodone-acetaminophen, ondansetron **OR** ondansetron (ZOFRAN) IV   Allergies as of 12/21/2016 - Review Complete 12/21/2016  Allergen Reaction Noted  . Adhesive [tape] Other (See Comments) 06/06/2012  . Latex Other (See Comments) 12/06/2015  . Lotrel [amlodipine besy-benazepril hcl] Itching and Cough 05/23/2012    Family History  Problem Relation Age of Onset  . Diabetes Mother   . Diabetes Father   . Diabetes Sister   . Diabetes Brother   . Heart disease Neg Hx   . Stroke Neg Hx   . Cancer Neg Hx     Social History   Social History  . Marital status: Widowed    Spouse name: N/A  . Number of children: N/A  . Years of education: N/A   Occupational History  . Not on file.   Social History Main Topics  . Smoking status: Former Smoker    Quit date: 11/05/1984  . Smokeless tobacco: Never Used     Comment: stopped smoking 1983  . Alcohol use No  . Drug use: No  . Sexual activity: No   Other Topics Concern  . Not  on file   Social History Narrative  . No narrative on file    REVIEW OF SYSTEMS: Constitutional:  Weakness. ENT:  No nose bleeds Pulm:  Stable dyspnea on exertion. Because she doesn't move around a lot she doesn't experience much DOE. Cough which is occasionally productive of nonpurulent sputum. No hemoptysis. CV:  No palpitations, no LE edema. No chest pain GU:  Foley catheter was removed 4 days ago when she was admitted, since then she's needed and out catheterization in order to avoid. She just sat down on the bedside commode and was  able to void a small amount of urine on her own. GI:  Per HPI Heme:  Per HPI.  Denies unusual bleeding.  Does develop purpura/bruising fairly easily.   Transfusions:  Per HPI Neuro:  No headaches, no peripheral tingling or numbness Derm:  No itching, no rash or sores.  Endocrine:  No sweats or chills.  No polyuria or dysuria Immunization:  Current on her flu vaccination. Travel:  None beyond local counties in last few months.    PHYSICAL EXAM: Vital signs in last 24 hours: Vitals:   12/25/16 1147 12/25/16 1229  BP: (!) 159/61 (!) 159/62  Pulse: 71 68  Resp:  15  Temp: 98.5 F (36.9 C) 98 F (36.7 C)   Wt Readings from Last 3 Encounters:  12/25/16 72.6 kg (160 lb 0.9 oz)  12/20/16 72.6 kg (160 lb)  12/07/16 73.4 kg (161 lb 13.1 oz)    General: Chronically ill-appearing, somewhat cushingoid. Comfortable. Head:  No asymmetry or swelling. No signs of head trauma.  Eyes:  No scleral icterus, no conjunctival pallor. Ears:  No hearing deficit.  Nose:  No congestion or discharge. Mouth:  Moist, clear mucosa. Tongue midline. Upper full denture. Neck:  No JVD, no thyromegaly, no masses. Lungs:  Diminished breath sounds but no adventitious breath sounds. No cough. No labored breathing. Heart: RRR. No MRG. S1, S2 present. Abdomen:  Soft. No masses. No HSM. Nontender. Bowel sounds active. No HSM or hernias..   Rectal: Deferred.   Musc/Skeltl: Bandage covers the scar on the right knee. Extremities:  No CCE.  Neurologic:  Alert. Oriented times 3. No tremor, moves all 4 limbs, limb strength was not tested. Skin:  No telangiectasias, rashes or sores. Tattoos:  None. Nodes:  Cervical adenopathy.   Psych:  Pleasant, cooperative. Calm.  Intake/Output from previous day: 02/19 0701 - 02/20 0700 In: 315 [P.O.:120; I.V.:195] Out: 1650 [Urine:1400] Intake/Output this shift: Total I/O In: 270 [P.O.:240; Blood:30] Out: -   LAB RESULTS:  Recent Labs  12/23/16 0218 12/24/16 0246  12/25/16 0700  WBC 34.5* 27.6* 17.9*  HGB 8.6* 8.3* 7.4*  HCT 26.7* 26.1* 23.9*  PLT 683* 696* 559*   BMET Lab Results  Component Value Date   NA 131 (L) 12/25/2016   NA 132 (L) 12/24/2016   NA 137 12/23/2016   K 4.5 12/25/2016   K 4.3 12/24/2016   K 4.1 12/23/2016   CL 104 12/25/2016   CL 105 12/24/2016   CL 106 12/23/2016   CO2 18 (L) 12/25/2016   CO2 17 (L) 12/24/2016   CO2 19 (L) 12/23/2016   GLUCOSE 98 12/25/2016   GLUCOSE 105 (H) 12/24/2016   GLUCOSE 109 (H) 12/23/2016   BUN 16 12/25/2016   BUN 20 12/24/2016   BUN 19 12/23/2016   CREATININE 1.19 (H) 12/25/2016   CREATININE 1.26 (H) 12/24/2016   CREATININE 1.42 (H) 12/23/2016   CALCIUM 8.1 (L) 12/25/2016  CALCIUM 8.2 (L) 12/24/2016   CALCIUM 8.4 (L) 12/23/2016   LFT  Recent Labs  12/23/16 0218 12/25/16 0700  PROT 6.6 5.7*  ALBUMIN 2.3* 1.9*  AST 23 16  ALT 13* 11*  ALKPHOS 65 57  BILITOT 0.9 0.6   PT/INR Lab Results  Component Value Date   INR 1.12 12/21/2016   INR 1.05 12/10/2016   INR 0.97 11/23/2016   Hepatitis Panel No results for input(s): HEPBSAG, HCVAB, HEPAIGM, HEPBIGM in the last 72 hours. C-Diff No components found for: CDIFF Lipase     Component Value Date/Time   LIPASE 13 12/21/2016 1154    Drugs of Abuse  No results found for: LABOPIA, COCAINSCRNUR, LABBENZ, AMPHETMU, THCU, LABBARB   RADIOLOGY STUDIES: Us Renal  Result Date: 12/23/2016 CLINICAL DATA:  Patient with elevated creatinine. EXAM: RENAL / URINARY TRACT ULTRASOUND COMPLETE COMPARISON:  CT abdomen pelvis 12/21/2016 FINDINGS: Right Kidney: Length: 9.7 cm. Diffusely increased in echogenicity. Normal cortical thickness. No hydronephrosis. Left Kidney: Length: 8.9 cm. Diffusely increased in echogenicity. Normal cortical thickness. No hydronephrosis. Bladder: Appears normal for degree of bladder distention. IMPRESSION: No hydronephrosis. Diffusely increased renal cortical echogenicity as can be seen with chronic medical  renal disease. Electronically Signed   By: Drew  Davis M.D.   On: 12/23/2016 19:46     IMPRESSION:   *  CG emesis, nausea, FOBT + in pt on chronic PPI, prn H2 blockers, s/p fundoplication ~ 2009.  Liklihood of PUD is low given PPI, ? Small MW tear, ? Infectious esophagitis.    *  RA on plaquenil, Remicade, 2/7 infusion delayed in setting of recent surgery, next infusion due ~ 2/27.  *  Anemia, acute on chronic.  Chronic po iron at home.   *  S/p HH repair.  GERD.    *  A fib, SVT. On chronic Pradaxa.  Discontinued but last dose at 0920 today 2/20.    *  S/p TKR 12/11/15.     PLAN:     *  EGD.  This is set, with MAC, for ~ 2PM on 2/21. Pradaxa will have been off > 24 hours.  For now continue clear liquids, BID PPI (IV currently).   Complete the single PRBC currently transfusing.  CBC in AM.     Sarah Gribbin  12/25/2016, 1:01 PM Pager: [phone]   I have reviewed the entire case in detail with the above APP and discussed the plan in detail.  Therefore, I agree with the diagnoses recorded above. In addition,  I have personally interviewed and examined the patient and have personally reviewed any abdominal/pelvic CT scan images.  My additional thoughts are as follows:  CC: hematemesis  She appears to have had small volume, self-limited hematemesis in setting of nausea and vomiting during rocky postoperative course. Suspect nausea largely the result of recent illnesses and Abx.  Since she is on OAC, we would like to at least make sure there is no high-risk lesion such as an ulcer that might require temporary discontinuation of Pradaxa.  Recommendations are prn anti-emetics and we are planning an EGD tomorrow.  She is agreeable.  The benefits and risks of the planned procedure were described in detail with the patient or (when appropriate) their health care proxy.  Risks were outlined as including, but not limited to, bleeding, infection, perforation, adverse medication reaction  leading to cardiac or pulmonary decompensation, or pancreatitis (if ERCP).  The limitation of incomplete mucosal visualization was also discussed.  No guarantees or warranties   were given.   Anesthesia support will be requested due to patient's medical illness.   Patient at increased risk for cardiopulmonary complications of procedure due to medical comorbidities.      Nelida Meuse III Pager [phone]  Mon-Fri 8a-5p [phone] after 5p, weekends, holidays

## 2016-12-26 ENCOUNTER — Inpatient Hospital Stay (HOSPITAL_COMMUNITY): Payer: Medicare Other | Admitting: Certified Registered Nurse Anesthetist

## 2016-12-26 ENCOUNTER — Encounter (HOSPITAL_COMMUNITY): Payer: Self-pay

## 2016-12-26 ENCOUNTER — Encounter (HOSPITAL_COMMUNITY)
Admission: EM | Disposition: A | Discharge status: Home Health | Payer: Self-pay | Source: Home / Self Care | Attending: Internal Medicine

## 2016-12-26 DIAGNOSIS — K5900 Constipation, unspecified: Secondary | ICD-10-CM

## 2016-12-26 DIAGNOSIS — E43 Unspecified severe protein-calorie malnutrition: Secondary | ICD-10-CM

## 2016-12-26 HISTORY — PX: ESOPHAGOGASTRODUODENOSCOPY (EGD) WITH PROPOFOL: SHX5813

## 2016-12-26 HISTORY — DX: Unspecified severe protein-calorie malnutrition: E43

## 2016-12-26 LAB — CBC
HEMATOCRIT: 28.2 % — AB (ref 36.0–46.0)
HEMOGLOBIN: 9.1 g/dL — AB (ref 12.0–15.0)
MCH: 27.4 pg (ref 26.0–34.0)
MCHC: 32.3 g/dL (ref 30.0–36.0)
MCV: 84.9 fL (ref 78.0–100.0)
Platelets: 525 10*3/uL — ABNORMAL HIGH (ref 150–400)
RBC: 3.32 MIL/uL — AB (ref 3.87–5.11)
RDW: 15.3 % (ref 11.5–15.5)
WBC: 13.7 10*3/uL — ABNORMAL HIGH (ref 4.0–10.5)

## 2016-12-26 LAB — DIFFERENTIAL
BASOS ABS: 0 10*3/uL (ref 0.0–0.1)
BASOS PCT: 0 %
Eosinophils Absolute: 0.5 10*3/uL (ref 0.0–0.7)
Eosinophils Relative: 4 %
LYMPHS ABS: 3.1 10*3/uL (ref 0.7–4.0)
Lymphocytes Relative: 22 %
MONOS PCT: 15 %
Monocytes Absolute: 2.1 10*3/uL — ABNORMAL HIGH (ref 0.1–1.0)
NEUTROS ABS: 8 10*3/uL — AB (ref 1.7–7.7)
NEUTROS PCT: 59 %

## 2016-12-26 LAB — BASIC METABOLIC PANEL
ANION GAP: 9 (ref 5–15)
BUN: 15 mg/dL (ref 6–20)
CHLORIDE: 100 mmol/L — AB (ref 101–111)
CO2: 23 mmol/L (ref 22–32)
Calcium: 8.3 mg/dL — ABNORMAL LOW (ref 8.9–10.3)
Creatinine, Ser: 1.33 mg/dL — ABNORMAL HIGH (ref 0.44–1.00)
GFR calc non Af Amer: 38 mL/min — ABNORMAL LOW (ref >60)
GFR, EST AFRICAN AMERICAN: 44 mL/min — AB (ref >60)
Glucose, Bld: 94 mg/dL (ref 65–99)
Potassium: 3.6 mmol/L (ref 3.5–5.1)
Sodium: 132 mmol/L — ABNORMAL LOW (ref 135–145)

## 2016-12-26 LAB — CULTURE, BLOOD (ROUTINE X 2)
Culture: NO GROWTH
Culture: NO GROWTH

## 2016-12-26 SURGERY — ESOPHAGOGASTRODUODENOSCOPY (EGD) WITH PROPOFOL
Anesthesia: Monitor Anesthesia Care

## 2016-12-26 MED ORDER — FENTANYL CITRATE (PF) 100 MCG/2ML IJ SOLN: 25.0000 ug | INTRAMUSCULAR | Status: DC | PRN

## 2016-12-26 MED ORDER — SODIUM CHLORIDE 0.9 % IV SOLN: INTRAVENOUS | Status: DC

## 2016-12-26 MED ORDER — LACTATED RINGERS IV SOLN
INTRAVENOUS | Status: DC
  Administered 2016-12-26: 15:00:00 via INTRAVENOUS

## 2016-12-26 MED ORDER — PANTOPRAZOLE SODIUM 40 MG PO TBEC
40.0000 mg | DELAYED_RELEASE_TABLET | Freq: Every day | ORAL | Status: DC
  Administered 2016-12-27: 40 mg via ORAL
  Filled 2016-12-26: qty 1

## 2016-12-26 MED ORDER — PROPOFOL 10 MG/ML IV BOLUS
INTRAVENOUS | Status: DC | PRN
  Administered 2016-12-26: 40 mg via INTRAVENOUS

## 2016-12-26 MED ORDER — DEXTROSE 5 % IV SOLN
1.0000 g | INTRAVENOUS | Status: DC
  Administered 2016-12-26: 1 g via INTRAVENOUS
  Filled 2016-12-26 (×2): qty 10

## 2016-12-26 MED ORDER — PROPOFOL 500 MG/50ML IV EMUL
INTRAVENOUS | Status: DC | PRN
  Administered 2016-12-26: 100 ug/kg/min via INTRAVENOUS

## 2016-12-26 NOTE — Interval H&P Note (Signed)
History and Physical Interval Note:  12/26/2016 2:19 PM  Gloria Best  has presented today for surgery, with the diagnosis of acute on chronic anemia.  CG emesis.  The various methods of treatment have been discussed with the patient and family. After consideration of risks, benefits and other options for treatment, the patient has consented to  Procedure(s): ESOPHAGOGASTRODUODENOSCOPY (EGD) WITH PROPOFOL (N/A) as a surgical intervention .  The patient's history has been reviewed, patient examined, no change in status, stable for surgery.  I have reviewed the patient's chart and labs.  Questions were answered to the patient's satisfaction.     Nelida Meuse III

## 2016-12-26 NOTE — Anesthesia Postprocedure Evaluation (Signed)
Anesthesia Post Note  Patient: [name]  Procedure(s) Performed: Procedure(s) (LRB): ESOPHAGOGASTRODUODENOSCOPY (EGD) WITH PROPOFOL (N/A)  Patient location during evaluation: PACU Anesthesia Type: MAC Level of consciousness: awake and alert Pain management: pain level controlled Vital Signs Assessment: post-procedure vital signs reviewed and stable Respiratory status: spontaneous breathing, nonlabored ventilation, respiratory function stable and patient connected to nasal cannula oxygen Cardiovascular status: stable and blood pressure returned to baseline Anesthetic complications: no       Last Vitals:  Vitals:   12/26/16 1500 12/26/16 1510  BP: (!) 151/39 (!) 155/46  Pulse: 66 62  Resp: 13 13  Temp:      Last Pain:  Vitals:   12/26/16 1449  TempSrc: Oral  PainSc:                  Dorinda Stehr,W. EDMOND

## 2016-12-26 NOTE — H&P (View-Only) (Signed)
[facility] Consult: 1:01 PM 12/25/2016  LOS: 3 days    Referring Provider: Dr Allyson Sabal  Primary Care Physician:  Marco Collie, MD in [place] Primary Gastroenterologist:  Althia Forts.  Dr Odie Sera in [place].      Reason for Consultation:  Anemia.  CG emesis.     HPI: Gloria Best is a 76 y.o. female.  PMH DM 2.  OSA, not on CPAP.  HLD. Fibromyalgia.  Neurogenic bladder. P Afib on Pradaxa ` 2 years.  RA on monthly Remicade, was due infusion 12/12/16 but due to TKR, infusion delayed for 3 weeks.  S/p right THR.  S/p HH repair ~ 2009 at [facility].  Anemia, transfused PRBC x 2 in 06/2012.   Colonoscopy and upper endoscopy probably around 2013, 2014. She recalls having had colon polyps. She is not sure if she is going to need another colonoscopy at the 5 year interval.   S/p Right TKR 12/11/15.  Admission 2/5 - 2/8.  Discharged to [facility] with indwelling foley on her chronic po iron, Pradaxa, Aciphex, Ranitidine.  Anemia with Hgb nadir of 7.9 on 2/8.  Was not transfused.   Readmitted 2/16 with AMS, urosepsis, SVT/afib, hypokalemia, constipation, n/v and reports of blood in emesis PTA.  Mental status improved following administration of Narcan. Had multiple BMs in the ED following glycerin suppository. Now on daily MiraLAX.Marland Kitchen   Hgb 9.9 .. 8.6.. 8.3.. 7.4.  Plan is for PRBC x 1 today. CT abdomen pelvis without contrast to/16 showed aortic afterload sclerosis, large volume stool throughout colon and rectum, question fecal impaction. No other abdominal/pelvic abnormality. CT of the head showed chronic ischemic changes, atrophy.  Started vomiting overnight on the day of coming to the ED. She said it was coffee ground appearing from the get go. She doesn't normally have problems with nausea or vomiting. Denies  dysphagia and history of esophageal dilatation or strictures. Along with the nausea and vomiting was burning in her epigastric region. Nausea persists though she hasn't had any emesis since admission.  Patient hasn't needed transfusion between the interval of today and 2013. She is compliant with taking her daily iron. Doesn't use NSAIDs.  Lately she had been having constipation and her appetite has been poor since the knee surgery Patient had 2-D echo performed today showing LV EF of 60-65%.  Mild to moderate aortic, mitral valve regurg. Moderately dilated left atrium.  Past Medical History:  Diagnosis Date  . Anemia    hx of blood transfusion  . Anxiety   . Arthritis    RA  . Asthma    hx of as child  . Bladder disorder    having difficulty voiding, requires in and out catheter 2x day and prn; denied current need for I&O cath 08/27/16  . Bronchitis    hx of  . Carpal tunnel syndrome of left wrist    hx of  . Chronic back pain   . Chronic kidney disease   . Depression    takes medication for   . Diabetes mellitus    oral  medication  . Dysrhythmia    takes cardizem, sees Dr. Marco Collie primary  . Fibromyalgia    takes lyrica  . GERD (gastroesophageal reflux disease)   . H/O hiatal hernia    hx of   . Heart murmur   . Hypertension   . Pneumonia    hx of  . Sleep apnea    no CPAP. last sleep study over 10 years ago  . Urinary tract infection    hx of  . Wears dentures    top    Past Surgical History:  Procedure Laterality Date  . ABDOMINAL HYSTERECTOMY    . APPENDECTOMY    . BACK SURGERY      x 3  . CARPAL TUNNEL RELEASE     left side only  . CATARACT EXTRACTION     bilateral  . CESAREAN SECTION    . COLON SURGERY     "due to large polyp"  . COLONOSCOPY    . FOOT SURGERY    . HERNIA REPAIR    . TENOLYSIS Left 11/12/2013   Procedure: LEFT TENDON GRAFT RECONSTRUCTION OF LEFT LONG FINGER SAGITTAL FIBERS CORD RELEASE;  Surgeon: Cammie Sickle., MD;  Location:  [facility];  Service: Orthopedics;  Laterality: Left;  . TOTAL HIP ARTHROPLASTY  06/09/2012   Procedure: TOTAL HIP ARTHROPLASTY;  Surgeon: [name], MD;  Location: [facility];  Service: Orthopedics;  Laterality: Right;  Right total hip replacement  . TOTAL KNEE ARTHROPLASTY Right 12/10/2016   Procedure: RIGHT TOTAL KNEE ARTHROPLASTY;  Surgeon: [name], MD;  Location: [facility];  Service: Orthopedics;  Laterality: Right;    Prior to Admission medications   Medication Sig Start Date End Date Taking? Authorizing Provider  acetaminophen (TYLENOL) 500 MG tablet Take 500 mg by mouth every 8 (eight) hours as needed (for pain).    Yes Historical Provider, MD  ARIPiprazole (ABILIFY) 5 MG tablet Take 5 mg by mouth every evening.    Yes Historical Provider, MD  atorvastatin (LIPITOR) 80 MG tablet Take 80 mg by mouth daily at 6 PM.  06/29/16  Yes Historical Provider, MD  cloNIDine (CATAPRES) 0.2 MG tablet Take 0.2 mg by mouth 2 (two) times daily. 08/12/16  Yes Historical Provider, MD  COMBIVENT RESPIMAT 20-100 MCG/ACT AERS respimat Inhale 1 puff into the lungs every 6 (six) hours as needed for wheezing or shortness of breath. For shortness of breath. 06/20/16  Yes Historical Provider, MD  dabigatran (PRADAXA) 150 MG CAPS Take 150 mg by mouth every other day. Stopped prior to procedure 11-16-16   Yes Historical Provider, MD  diclofenac sodium (VOLTAREN) 1 % GEL Apply 2 g topically 4 (four) times daily as needed. For pain. 07/18/16  Yes Historical Provider, MD  diltiazem (CARDIZEM CD) 240 MG 24 hr capsule Take 240 mg by mouth daily. 06/12/16  Yes Historical Provider, MD  doxycycline (VIBRA-TABS) 100 MG tablet Take 100 mg by mouth 2 (two) times daily.   Yes Historical Provider, MD  escitalopram (LEXAPRO) 10 MG tablet Take 10 mg by mouth daily.   Yes Historical Provider, MD  ferrous sulfate 325 (65 FE) MG tablet Take 1 tablet (325 mg total) by mouth 2 (two) times daily with a meal. 12/14/16  Yes Lanae Crumbly, PA-C  folic acid (FOLVITE) 1 MG tablet Take 1 mg by mouth every evening.    Yes Historical Provider, MD  furosemide (LASIX) 40 MG tablet Take 40 mg by mouth daily. 06/29/16  Yes Historical Provider, MD  gabapentin (NEURONTIN) 100 MG capsule Take 100 mg by mouth 2 (two) times daily.   Yes Historical Provider, MD  HYDROcodone-acetaminophen (NORCO/VICODIN) 5-325 MG tablet Take 1 tablet by mouth every 6 (six) hours as needed for moderate pain.   Yes Historical Provider, MD  hydroxychloroquine (PLAQUENIL) 200 MG tablet Take 200 mg by mouth 2 (two) times daily.  07/26/16  Yes Historical Provider, MD  hydroxypropyl methylcellulose / hypromellose (ISOPTO TEARS / GONIOVISC) 2.5 % ophthalmic solution Place 1-2 drops into both eyes 3 (three) times daily as needed for dry eyes.   Yes Historical Provider, MD  MYRBETRIQ 50 MG TB24 tablet Take 50 mg by mouth daily. 06/20/16  Yes Historical Provider, MD  oxybutynin (DITROPAN) 5 MG tablet Take 10 mg by mouth 3 (three) times daily.   Yes Historical Provider, MD  potassium chloride SA (K-DUR,KLOR-CON) 20 MEQ tablet Take 20 mEq by mouth daily.   Yes Historical Provider, MD  pramipexole (MIRAPEX) 0.5 MG tablet Take 0.5 mg by mouth at bedtime.   Yes Historical Provider, MD  pregabalin (LYRICA) 50 MG capsule Take 50 mg by mouth 2 (two) times daily.   Yes Historical Provider, MD  RABEprazole (ACIPHEX) 20 MG tablet Take 20 mg by mouth daily. 06/27/16  Yes Historical Provider, MD  ranitidine (ZANTAC) 300 MG tablet Take 300 mg by mouth daily at 2 PM. 07/12/16  Yes Historical Provider, MD  Vitamin D, Ergocalciferol, (DRISDOL) 50000 units CAPS capsule Take 50,000 Units by mouth every 30 (thirty) days. 07/12/16  Yes Historical Provider, MD    Scheduled Meds: . atorvastatin  80 mg Oral q1800  . ceFEPime (MAXIPIME) IV  1 g Intravenous Q24H  . diltiazem  240 mg Oral Daily  . escitalopram  10 mg Oral Daily  . furosemide  60 mg Intravenous Once  . milk and molasses  1 enema  Rectal Once  . mirabegron ER  50 mg Oral Daily  . pantoprazole (PROTONIX) IV  40 mg Intravenous Q12H  . polyethylene glycol  17 g Oral Daily  . pregabalin  50 mg Oral BID  . rOPINIRole  0.5 mg Oral QHS  . sodium chloride  250 mL Intravenous Once  . sodium chloride flush  3 mL Intravenous Q12H  . tamsulosin  0.4 mg Oral QPC breakfast   Infusions:  PRN Meds: acetaminophen **OR** acetaminophen, albuterol, hydrALAZINE, HYDROcodone-acetaminophen, ondansetron **OR** ondansetron (ZOFRAN) IV   Allergies as of 12/21/2016 - Review Complete 12/21/2016  Allergen Reaction Noted  . Adhesive [tape] Other (See Comments) 06/06/2012  . Latex Other (See Comments) 12/06/2015  . Lotrel [amlodipine besy-benazepril hcl] Itching and Cough 05/23/2012    Family History  Problem Relation Age of Onset  . Diabetes Mother   . Diabetes Father   . Diabetes Sister   . Diabetes Brother   . Heart disease Neg Hx   . Stroke Neg Hx   . Cancer Neg Hx     Social History   Social History  . Marital status: Widowed    Spouse name: N/A  . Number of children: N/A  . Years of education: N/A   Occupational History  . Not on file.   Social History Main Topics  . Smoking status: Former Smoker    Quit date: 11/05/1984  . Smokeless tobacco: Never Used     Comment: stopped smoking 1983  . Alcohol use No  . Drug use: No  . Sexual activity: No   Other Topics Concern  . Not  on file   Social History Narrative  . No narrative on file    REVIEW OF SYSTEMS: Constitutional:  Weakness. ENT:  No nose bleeds Pulm:  Stable dyspnea on exertion. Because she doesn't move around a lot she doesn't experience much DOE. Cough which is occasionally productive of nonpurulent sputum. No hemoptysis. CV:  No palpitations, no LE edema. No chest pain GU:  Foley catheter was removed 4 days ago when she was admitted, since then she's needed and out catheterization in order to avoid. She just sat down on the bedside commode and was  able to void a small amount of urine on her own. GI:  Per HPI Heme:  Per HPI.  Denies unusual bleeding.  Does develop purpura/bruising fairly easily.   Transfusions:  Per HPI Neuro:  No headaches, no peripheral tingling or numbness Derm:  No itching, no rash or sores.  Endocrine:  No sweats or chills.  No polyuria or dysuria Immunization:  Current on her flu vaccination. Travel:  None beyond local counties in last few months.    PHYSICAL EXAM: Vital signs in last 24 hours: Vitals:   12/25/16 1147 12/25/16 1229  BP: (!) 159/61 (!) 159/62  Pulse: 71 68  Resp:  15  Temp: 98.5 F (36.9 C) 98 F (36.7 C)   Wt Readings from Last 3 Encounters:  12/25/16 72.6 kg (160 lb 0.9 oz)  12/20/16 72.6 kg (160 lb)  12/07/16 73.4 kg (161 lb 13.1 oz)    General: Chronically ill-appearing, somewhat cushingoid. Comfortable. Head:  No asymmetry or swelling. No signs of head trauma.  Eyes:  No scleral icterus, no conjunctival pallor. Ears:  No hearing deficit.  Nose:  No congestion or discharge. Mouth:  Moist, clear mucosa. Tongue midline. Upper full denture. Neck:  No JVD, no thyromegaly, no masses. Lungs:  Diminished breath sounds but no adventitious breath sounds. No cough. No labored breathing. Heart: RRR. No MRG. S1, S2 present. Abdomen:  Soft. No masses. No HSM. Nontender. Bowel sounds active. No HSM or hernias..   Rectal: Deferred.   Musc/Skeltl: Bandage covers the scar on the right knee. Extremities:  No CCE.  Neurologic:  Alert. Oriented times 3. No tremor, moves all 4 limbs, limb strength was not tested. Skin:  No telangiectasias, rashes or sores. Tattoos:  None. Nodes:  Cervical adenopathy.   Psych:  Pleasant, cooperative. Calm.  Intake/Output from previous day: 02/19 0701 - 02/20 0700 In: 315 [P.O.:120; I.V.:195] Out: 1650 [Urine:1400] Intake/Output this shift: Total I/O In: 270 [P.O.:240; Blood:30] Out: -   LAB RESULTS:  Recent Labs  12/23/16 0218 12/24/16 0246  12/25/16 0700  WBC 34.5* 27.6* 17.9*  HGB 8.6* 8.3* 7.4*  HCT 26.7* 26.1* 23.9*  PLT 683* 696* 559*   BMET Lab Results  Component Value Date   NA 131 (L) 12/25/2016   NA 132 (L) 12/24/2016   NA 137 12/23/2016   K 4.5 12/25/2016   K 4.3 12/24/2016   K 4.1 12/23/2016   CL 104 12/25/2016   CL 105 12/24/2016   CL 106 12/23/2016   CO2 18 (L) 12/25/2016   CO2 17 (L) 12/24/2016   CO2 19 (L) 12/23/2016   GLUCOSE 98 12/25/2016   GLUCOSE 105 (H) 12/24/2016   GLUCOSE 109 (H) 12/23/2016   BUN 16 12/25/2016   BUN 20 12/24/2016   BUN 19 12/23/2016   CREATININE 1.19 (H) 12/25/2016   CREATININE 1.26 (H) 12/24/2016   CREATININE 1.42 (H) 12/23/2016   CALCIUM 8.1 (L) 12/25/2016  CALCIUM 8.2 (L) 12/24/2016   CALCIUM 8.4 (L) 12/23/2016   LFT  Recent Labs  12/23/16 0218 12/25/16 0700  PROT 6.6 5.7*  ALBUMIN 2.3* 1.9*  AST 23 16  ALT 13* 11*  ALKPHOS 65 57  BILITOT 0.9 0.6   PT/INR Lab Results  Component Value Date   INR 1.12 12/21/2016   INR 1.05 12/10/2016   INR 0.97 11/23/2016   Hepatitis Panel No results for input(s): HEPBSAG, HCVAB, HEPAIGM, HEPBIGM in the last 72 hours. C-Diff No components found for: CDIFF Lipase     Component Value Date/Time   LIPASE 13 12/21/2016 1154    Drugs of Abuse  No results found for: LABOPIA, COCAINSCRNUR, LABBENZ, AMPHETMU, THCU, LABBARB   RADIOLOGY STUDIES: US Renal  Result Date: 12/23/2016 CLINICAL DATA:  Patient with elevated creatinine. EXAM: RENAL / URINARY TRACT ULTRASOUND COMPLETE COMPARISON:  CT abdomen pelvis 12/21/2016 FINDINGS: Right Kidney: Length: 9.7 cm. Diffusely increased in echogenicity. Normal cortical thickness. No hydronephrosis. Left Kidney: Length: 8.9 cm. Diffusely increased in echogenicity. Normal cortical thickness. No hydronephrosis. Bladder: Appears normal for degree of bladder distention. IMPRESSION: No hydronephrosis. Diffusely increased renal cortical echogenicity as can be seen with chronic medical  renal disease. Electronically Signed   By: Lovey Newcomer M.D.   On: 12/23/2016 19:46     IMPRESSION:   *  CG emesis, nausea, FOBT + in pt on chronic PPI, prn H2 blockers, s/p fundoplication ~ 8527.  Liklihood of PUD is low given PPI, ? Small MW tear, ? Infectious esophagitis.    *  RA on plaquenil, Remicade, 2/7 infusion delayed in setting of recent surgery, next infusion due ~ 2/27.  *  Anemia, acute on chronic.  Chronic po iron at home.   *  S/p HH repair.  GERD.    *  A fib, SVT. On chronic Pradaxa.  Discontinued but last dose at 75 today 2/20.    *  S/p TKR 12/11/15.     PLAN:     *  EGD.  This is set, with MAC, for ~ 2PM on 2/21. Pradaxa will have been off > 24 hours.  For now continue clear liquids, BID PPI (IV currently).   Complete the single PRBC currently transfusing.  CBC in AM.     Azucena Freed  12/25/2016, 1:01 PM Pager: [phone]   I have reviewed the entire case in detail with the above APP and discussed the plan in detail.  Therefore, I agree with the diagnoses recorded above. In addition,  I have personally interviewed and examined the patient and have personally reviewed any abdominal/pelvic CT scan images.  My additional thoughts are as follows:  CC: hematemesis  She appears to have had small volume, self-limited hematemesis in setting of nausea and vomiting during rocky postoperative course. Suspect nausea largely the result of recent illnesses and Abx.  Since she is on [facility], we would like to at least make sure there is no high-risk lesion such as an ulcer that might require temporary discontinuation of Pradaxa.  Recommendations are prn anti-emetics and we are planning an EGD tomorrow.  She is agreeable.  The benefits and risks of the planned procedure were described in detail with the patient or (when appropriate) their health care proxy.  Risks were outlined as including, but not limited to, bleeding, infection, perforation, adverse medication reaction  leading to cardiac or pulmonary decompensation, or pancreatitis (if ERCP).  The limitation of incomplete mucosal visualization was also discussed.  No guarantees or warranties  were given.   Anesthesia support will be requested due to patient's medical illness.   Patient at increased risk for cardiopulmonary complications of procedure due to medical comorbidities.      Nelida Meuse III Pager [phone]  Mon-Fri 8a-5p [phone] after 5p, weekends, holidays

## 2016-12-26 NOTE — Anesthesia Preprocedure Evaluation (Signed)
Anesthesia Evaluation  Patient identified by MRN, date of birth, ID band Patient awake    Reviewed: Allergy & Precautions, H&P , NPO status , Patient's Chart, lab work & pertinent test results  Airway Mallampati: III  TM Distance: >3 FB Neck ROM: Full    Dental no notable dental hx. (+) Upper Dentures, Dental Advisory Given   Pulmonary asthma , sleep apnea , former smoker,    Pulmonary exam normal breath sounds clear to auscultation       Cardiovascular hypertension, + dysrhythmias  Rhythm:Regular Rate:Normal     Neuro/Psych Anxiety Depression negative neurological ROS     GI/Hepatic Neg liver ROS, GERD  ,  Endo/Other  diabetes  Renal/GU Renal InsufficiencyRenal disease  negative genitourinary   Musculoskeletal  (+) Arthritis , Osteoarthritis,  Fibromyalgia -  Abdominal   Peds  Hematology negative hematology ROS (+) anemia ,   Anesthesia Other Findings   Reproductive/Obstetrics negative OB ROS                             Anesthesia Physical Anesthesia Plan  ASA: III  Anesthesia Plan: MAC   Post-op Pain Management:    Induction: Intravenous  Airway Management Planned: Nasal Cannula  Additional Equipment:   Intra-op Plan:   Post-operative Plan: Extubation in OR  Informed Consent: I have reviewed the patients History and Physical, chart, labs and discussed the procedure including the risks, benefits and alternatives for the proposed anesthesia with the patient or authorized representative who has indicated his/her understanding and acceptance.   Dental advisory given  Plan Discussed with: CRNA  Anesthesia Plan Comments:         Anesthesia Quick Evaluation

## 2016-12-26 NOTE — Progress Notes (Signed)
PT Cancellation Note  Patient Details Name: Gloria Best MRN: [id number] DOB: [birth year]   Cancelled Treatment:    Reason Eval/Treat Not Completed: Patient at procedure or test/unavailable Pt at Endo, Will follow up next available time.   Marguarite Arbour A Jarion Hawthorne 12/26/2016, 1:47 PM  Wray Kearns, Vincent, DPT [phone]

## 2016-12-26 NOTE — Clinical Social Work Note (Signed)
Patient admitted from [facility] but plan is for patient to return home at discharge. CSW signing off at this time.   Liz Beach MSW, [place], [place], [phone]

## 2016-12-26 NOTE — Op Note (Signed)
[facility] Patient Name: Gloria Best Procedure Date : 12/26/2016 MRN: [id number] Attending MD: Estill Cotta. Loletha Best , MD Date of Birth: 1940/11/24 CSN: [id number] Age: 76 Admit Type: Inpatient Procedure:                Upper GI endoscopy Indications:              Coffee-ground emesis Providers:                Mallie Mussel L. [name], MD, Cleda Daub, RN, Elspeth Cho Tech., Technician, Trixie Deis, CRNA Referring MD:              Medicines:                Monitored Anesthesia Care Complications:            No immediate complications. Estimated Blood Loss:     Estimated blood loss: none. Procedure:                Pre-Anesthesia Assessment:                           - Prior to the procedure, a History and Physical                            was performed, and patient medications and                            allergies were reviewed. The patient's tolerance of                            previous anesthesia was also reviewed. The risks                            and benefits of the procedure and the sedation                            options and risks were discussed with the patient.                            All questions were answered, and informed consent                            was obtained. Prior Anticoagulants: The patient has                            taken Pradaxa (dabigatran), last dose was 1 day                            prior to procedure. ASA Grade Assessment: III - A                            patient with severe systemic disease. After  reviewing the risks and benefits, the patient was                            deemed in satisfactory condition to undergo the                            procedure.                           After obtaining informed consent, the endoscope was                            passed under direct vision. Throughout the                            procedure, the patient's blood pressure,  pulse, and                            oxygen saturations were monitored continuously. The                            EG-[id number] ([id number]) scope was introduced through the                            mouth, and advanced to the second part of duodenum.                            The upper GI endoscopy was accomplished without                            difficulty. The patient tolerated the procedure                            well. Scope In: Scope Out: Findings:      The esophagus was normal.      The stomach was normal.      The cardia and gastric fundus were normal on retroflexion.      The examined duodenum was normal. Impression:               - Normal esophagus.                           - Normal stomach.                           - Normal examined duodenum.                           - No specimens collected.                           No cause for hematemesis wass seen. The patient's                            nausea appears due to recent infecious illness and  antibiotics. Moderate Sedation:      MAC sedation used Recommendation:           - Resume regular diet.                           - Resume Pradaxa (dabigatran) at prior dose today.                           - prn anti-emetics. Procedure Code(s):        --- Professional ---                           [phone], Esophagogastroduodenoscopy, flexible,                            transoral; diagnostic, including collection of                            specimen(s) by brushing or washing, when performed                            (separate procedure) Diagnosis Code(s):        --- Professional ---                           K92.0, Hematemesis CPT copyright 2016 American Medical Association. All rights reserved. The codes documented in this report are preliminary and upon coder review may  be revised to meet current compliance requirements. Gloria L. [name], MD 12/26/2016 2:47:17 PM This report has been signed  electronically. Number of Addenda: 0

## 2016-12-26 NOTE — Transfer of Care (Signed)
Immediate Anesthesia Transfer of Care Note  Patient: Gloria Best  Procedure(s) Performed: Procedure(s): ESOPHAGOGASTRODUODENOSCOPY (EGD) WITH PROPOFOL (N/A)  Patient Location: Endoscopy Unit  Anesthesia Type:MAC  Level of Consciousness: awake, alert  and oriented  Airway & Oxygen Therapy: Patient Spontanous Breathing and Patient connected to nasal cannula oxygen  Post-op Assessment: Report given to RN, Post -op Vital signs reviewed and stable and Patient moving all extremities  Post vital signs: Reviewed and stable  Last Vitals:  Vitals:   12/26/16 1340 12/26/16 1449  BP: (!) 144/42 (!) 128/43  Pulse: 66 64  Resp: 16 15  Temp: 36.8 C     Last Pain:  Vitals:   12/26/16 1340  TempSrc: Oral  PainSc:       Patients Stated Pain Goal: 0 (32/54/98 2641)  Complications: No apparent anesthesia complications

## 2016-12-26 NOTE — Progress Notes (Signed)
Occupational Therapy Treatment Patient Details Name: Gloria Best MRN: [id number] DOB: January 08, 1941 Today's Date: 12/26/2016    History of present illness 76 y.o. 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   OT comments  Pt reports feeling much better today and agreeable to OOB activity. Pt continues to require max assist for LB dressing. Min assist provided for functional mobility in room. VSS throughout. D/c plan remains appropriate. Will continue to follow acutely.   Follow Up Recommendations  Home health OT;Supervision/Assistance - 24 hour    Equipment Recommendations  None recommended by OT    Recommendations for Other Services      Precautions / Restrictions Precautions Precautions: Knee;Fall Restrictions Weight Bearing Restrictions: Yes RLE Weight Bearing: Weight bearing as tolerated       Mobility Bed Mobility Overal bed mobility: Needs Assistance Bed Mobility: Supine to Sit     Supine to sit: Supervision     General bed mobility comments: HOB minimally elevated. No physical assist, no use of bed rails, increased time.  Transfers Overall transfer level: Needs assistance Equipment used: Rolling walker (2 wheeled) Transfers: Sit to/from Stand Sit to Stand: Min guard         General transfer comment: Cues for hand placement with RW. Min guard for safety.    Balance Overall balance assessment: Needs assistance Sitting-balance support: Feet supported;No upper extremity supported Sitting balance-Leahy Scale: Good     Standing balance support: Bilateral upper extremity supported Standing balance-Leahy Scale: Poor Standing balance comment: RW for support                   ADL Overall ADL's : Needs assistance/impaired                      Lower Body Dressing: Maximal assistance;Sit to/from stand   Toilet Transfer: Minimal assistance;Ambulation;BSC;RW Toilet Transfer Details (indicate cue type and reason): Simulated by sit to stand from EOB with functional mobility in room.         Functional mobility during ADLs: Minimal assistance;Rolling walker        Vision                     Perception     Praxis      Cognition   Behavior During Therapy: WFL for tasks assessed/performed Overall Cognitive Status: Within Functional Limits for tasks assessed                         Exercises     Shoulder Instructions       General Comments      Pertinent Vitals/ Pain       Pain Assessment: No/denies pain  Home Living                                          Prior Functioning/Environment              Frequency  Min 2X/week        Progress Toward Goals  OT Goals(current goals can now be found in the care plan section)  Progress towards OT goals: Progressing toward goals  Acute Rehab OT Goals Patient Stated Goal: return  home OT Goal Formulation: With patient  Plan Discharge plan remains appropriate    Co-evaluation                 End of Session Equipment Utilized During Treatment: Rolling walker  OT Visit Diagnosis: Unsteadiness on feet (R26.81);Other abnormalities of gait and mobility (R26.89);Muscle weakness (generalized) (M62.81);Pain Pain - Right/Left: Right Pain - part of body: Knee   Activity Tolerance Patient tolerated treatment well   Patient Left in chair;with call bell/phone within reach   Nurse Communication Mobility status        Time: 2277-3750 OT Time Calculation (min): 16 min  Charges: OT General Charges $OT Visit: 1 Procedure OT Treatments $Self Care/Home Management : 8-22 mins  Renly Guedes A. Ulice Brilliant, M.S., OTR/L Pager: [place] 12/26/2016, 11:47 AM

## 2016-12-26 NOTE — Care Management Important Message (Signed)
Important Message  Patient Details  Name: Gloria Best MRN: [id number] Date of Birth: 30-Jan-1941   Medicare Important Message Given:  Yes    Nathen May 12/26/2016, 10:10 AM

## 2016-12-26 NOTE — Progress Notes (Signed)
[facility] PROGRESS NOTE  Gloria Best DOB: [birth year] DOA: 12/21/2016   PCP: Marco Collie, MD     Assessment/Plan: Active Problems:   Elevated WBC count   Hematemesis with nausea   Acute renal failure (ARF) ([place])   Altered mental status   Constipated   Gloria Best is a 76 y.o. female  past mental history significant for of anemia, anxiety, asthma, bladder disorder, recurrent UTIs, chronic kidney disease, diabetes MR [place], reflux, hypertension, pneumonia, sleep apnea presents to emergency room with chief complaint hematemesis from her nursing facility. Per report from EDP patient was sent over for multiple episodes of emesis earlier today. One sample was checked and was positive for blood.  . EDP recommended consulting GI for upper GI bleed. Patient has had multiple normal bowel movements in the emergency room. CT of the abdomen and pelvis showed significant constipation.      Assessment and plan Sepsis, likely secondary to urinary source, patient has had a indwelling Foley since surgery for urinary retention  White blood cell count significantly elevated on admission-likely leukemoid reaction, slowly improving Follow blood culture 2, urine culture , no growth so far, all Discontinued Foley, CT abdomen pelvis showed Large amount of stool seen throughout the colon and rectum concerning for rectal impaction  Chest x-ray negative GI panel to rule out gastroenteritis as the patient has had nausea with vomiting 2 days was negative Initially placed on Zosyn and vancomycin Antibiotics have been narrowed down to Rocephin, as patients leukocytosis, sepsis physiology seems to have improved significantly    Urinary retention, resolved Renal ultrasound-no urinary retention, no hydronephrosis,Diffusely increased renal cortical echogenicity as can be seen with chronic medical renal disease. Foley discontinued Held Dupont as both can cause  retention, reviewed meds with pharmacy Treated with  , in and out cath, post void residuals   monitoring Started on flomax Urinary retention seems to have resolved   Hematemesis, anemia patient denies any GI bleeding,  baseline between 9-10, hemoglobin now 7.4, no signs of active bleeding for several days   FOBT positive UA negative for blood  transfused 1 unit given borderline elevated troponin, keep hemoglobin greater than 8.0, on  2/20 Consulted [facility], NPO , .IV PPI, hold pradaxa , EGD scheduled 2/21      SVT/atrial fibrillation, in the setting of sepsis, resolved Sustained for several hours , now  converted With  IV metoprolol, magnesium,K   Repletion cardiac enzymes borderline,   2-D echo to rule out wall motion abnormalities, none seen May need ischemic workup in the future Continue Cardizem, did not need IV Cardizem as the patient spontaneously converted after repletion of electrolytes Continue Pradaxa Venous Doppler negative for DVT Low threshold to rule out PE  Leukocytosis Bandemia, left shift secondary to infection Improving   Hypertension Cont cardizem Resume Lasix   Diabetes Most recent hemoglobin A1c was 6 Continue current regimen  Hypokalemia-repleted  GERD Cont PPI/H2 blocker  Anxiety/depression Resume lexapro   HTN Hypertensive continue current meds    Constipation, 6 bowel movements since admission overnight Cleaned rectum WITH combination of suppositories and enemas daily MiraLAX  Holding constipating meds  Hyperlipidemia Continue statin  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  Does not appear to be have a septic joint    DVT prophylaxsis Pradaxa  Code Status:  Full code     Family Communication: Discussed in detail with the patient, all imaging results, lab results explained to the patient  Disposition Plan:  EGD today     Consultants:  Gastroenterology  Procedures:  None  Antibiotics: Zosyn  2/16-2/70 Vancomycin 2/17-2/19 Rocephin 2/21-      HPI/Subjective: Denies nausea, vomiting, abdominal pain  Objective: Vitals:   12/25/16 1535 12/25/16 2007 12/25/16 2328 12/26/16 0515  BP: (!) 153/58 136/70  (!) 133/52  Pulse: 70 70 74 75  Resp: 14 16 15 10   Temp: 97.8 F (36.6 C) 98 F (36.7 C)  98.2 F (36.8 C)  TempSrc: Oral Oral  Oral  SpO2: 91% 99% 99% 97%  Weight:    72.8 kg (160 lb 9.6 oz)  Height:    5' (1.524 m)    Intake/Output Summary (Last 24 hours) at 12/26/16 0827 Last data filed at 12/26/16 4782  Gross per 24 hour  Intake             1040 ml  Output              800 ml  Net              240 ml    Exam:  Examination:  General exam: Appears calm and comfortable  Respiratory system: Clear to auscultation. Respiratory effort normal. Cardiovascular system: S1 & S2 heard, RRR. No JVD, murmurs, rubs, gallops or clicks. No pedal edema. Gastrointestinal system: Abdomen is nondistended, soft and nontender. No organomegaly or masses felt. Normal bowel sounds heard. Central nervous system: Alert and oriented. No focal neurological deficits. Extremities: right knee dressing intact, knee not warm Skin: No rashes, lesions or ulcers Psychiatry: Judgement and insight appear normal. Mood & affect appropriate.     Data Reviewed: I have personally reviewed following labs and imaging studies  Micro Results Recent Results (from the past 240 hour(s))  Culture, blood (routine x 2)     Status: None (Preliminary result)   Collection Time: 12/21/16  4:36 PM  Result Value Ref Range Status   Specimen Description BLOOD RIGHT HAND  Final   Special Requests BOTTLES DRAWN AEROBIC AND ANAEROBIC 5CC  Final   Culture NO GROWTH 4 DAYS  Final   Report Status PENDING  Incomplete  Culture, blood (routine x 2)     Status: None (Preliminary result)   Collection Time: 12/21/16  4:36 PM  Result Value Ref Range Status   Specimen Description BLOOD LEFT HAND  Final   Special  Requests AEROBIC BOTTLE ONLY  5CC  Final   Culture NO GROWTH 4 DAYS  Final   Report Status PENDING  Incomplete  MRSA PCR Screening     Status: None   Collection Time: 12/21/16  7:01 PM  Result Value Ref Range Status   MRSA by PCR NEGATIVE NEGATIVE Final    Comment:        The GeneXpert MRSA Assay (FDA approved for NASAL specimens only), is one component of a comprehensive MRSA colonization surveillance program. It is not intended to diagnose MRSA infection nor to guide or monitor treatment for MRSA infections.   Gastrointestinal Panel by PCR , Stool     Status: None   Collection Time: 12/22/16 11:44 AM  Result Value Ref Range Status   Campylobacter species NOT DETECTED NOT DETECTED Final   Plesimonas shigelloides NOT DETECTED NOT DETECTED Final   Salmonella species NOT DETECTED NOT DETECTED Final   Yersinia enterocolitica NOT DETECTED NOT DETECTED Final   Vibrio species NOT DETECTED NOT DETECTED Final   Vibrio cholerae NOT DETECTED NOT DETECTED Final   Enteroaggregative E coli (EAEC) NOT  DETECTED NOT DETECTED Final   Enteropathogenic E coli (EPEC) NOT DETECTED NOT DETECTED Final   Enterotoxigenic E coli (ETEC) NOT DETECTED NOT DETECTED Final   Shiga like toxin producing E coli (STEC) NOT DETECTED NOT DETECTED Final   Shigella/Enteroinvasive E coli (EIEC) NOT DETECTED NOT DETECTED Final   Cryptosporidium NOT DETECTED NOT DETECTED Final   Cyclospora cayetanensis NOT DETECTED NOT DETECTED Final   Entamoeba histolytica NOT DETECTED NOT DETECTED Final   Giardia lamblia NOT DETECTED NOT DETECTED Final   Adenovirus F40/41 NOT DETECTED NOT DETECTED Final   Astrovirus NOT DETECTED NOT DETECTED Final   Norovirus GI/GII NOT DETECTED NOT DETECTED Final   Rotavirus A NOT DETECTED NOT DETECTED Final   Sapovirus (I, II, IV, and V) NOT DETECTED NOT DETECTED Final  Urine culture     Status: None   Collection Time: 12/24/16  5:39 PM  Result Value Ref Range Status   Specimen Description  URINE, CATHETERIZED  Final   Special Requests NONE  Final   Culture NO GROWTH  Final   Report Status 12/25/2016 FINAL  Final    Radiology Reports Ct Abdomen Pelvis Wo Contrast  Result Date: 12/21/2016 CLINICAL DATA:  Acute mid abdominal pain. EXAM: CT ABDOMEN AND PELVIS WITHOUT CONTRAST TECHNIQUE: Multidetector CT imaging of the abdomen and pelvis was performed following the standard protocol without IV contrast. COMPARISON:  CT scan of September 18, 2014. FINDINGS: Lower chest: Mild bilateral posterior basilar subsegmental atelectasis is noted. Hepatobiliary: No focal liver abnormality is seen. No gallstones, gallbladder wall thickening, or biliary dilatation. Pancreas: Unremarkable. No pancreatic ductal dilatation or surrounding inflammatory changes. Spleen: Normal in size without focal abnormality. Adrenals/Urinary Tract: Adrenal glands appear normal. No hydronephrosis or renal obstruction is noted. No renal or ureteral calculi are noted. Urinary bladder is decompressed secondary to Foley catheter. Stomach/Bowel: Large amount of stool is noted throughout the colon and rectum concerning for impaction. No small bowel dilatation is noted. Vascular/Lymphatic: Aortic atherosclerosis. No enlarged abdominal or pelvic lymph nodes. Reproductive: Status post hysterectomy. No adnexal masses. Other: No abdominal wall hernia or abnormality. No abdominopelvic ascites. Musculoskeletal: Status post right hip arthroplasty. Status post surgical fusion of lower lumbar spine. IMPRESSION: Aortic atherosclerosis. Large amount of stool seen throughout the colon and rectum concerning for rectal impaction. No other significant abnormality seen in the abdomen or pelvis. Electronically Signed   By: Marijo Conception, M.D.   On: 12/21/2016 14:56   Ct Head Wo Contrast  Result Date: 12/22/2016 CLINICAL DATA:  Altered mental status, history diabetes mellitus, dysrhythmia, hypertension, former smoker, asthma EXAM: CT HEAD WITHOUT  CONTRAST TECHNIQUE: Contiguous axial images were obtained from the base of the skull through the vertex without intravenous contrast. COMPARISON:  11/24/2011 FINDINGS: Brain: Generalized atrophy. Normal ventricular morphology. No midline shift or mass effect. Small vessel chronic ischemic changes of deep cerebral white matter. No intracranial hemorrhage, mass lesion, evidence of acute infarction, or extra-axial fluid collection. Vascular: Mild atherosclerotic calcifications at the carotid siphons Skull: Intact Sinuses/Orbits: Clear Other: N/A IMPRESSION: Atrophy with small vessel chronic ischemic changes of deep cerebral white matter. No acute intracranial abnormalities. Electronically Signed   By: Lavonia Dana M.D.   On: 12/22/2016 16:41   US Renal  Result Date: 12/23/2016 CLINICAL DATA:  Patient with elevated creatinine. EXAM: RENAL / URINARY TRACT ULTRASOUND COMPLETE COMPARISON:  CT abdomen pelvis 12/21/2016 FINDINGS: Right Kidney: Length: 9.7 cm. Diffusely increased in echogenicity. Normal cortical thickness. No hydronephrosis. Left Kidney: Length: 8.9 cm. Diffusely increased in  echogenicity. Normal cortical thickness. No hydronephrosis. Bladder: Appears normal for degree of bladder distention. IMPRESSION: No hydronephrosis. Diffusely increased renal cortical echogenicity as can be seen with chronic medical renal disease. Electronically Signed   By: Lovey Newcomer M.D.   On: 12/23/2016 19:46   Portable Chest 1 View  Result Date: 12/22/2016 CLINICAL DATA:  Elevated white blood cell count. No shortness of breath. History of diabetes and hypertension. EXAM: PORTABLE CHEST 1 VIEW COMPARISON:  12/21/2016. FINDINGS: Cardiac silhouette is normal in size. No mediastinal or hilar masses. No convincing adenopathy. Mild increased bronchovascular markings are noted bilaterally most evident at the medial lung bases. There is likely a component of atelectasis at the medial lung bases. No convincing pneumonia. No pulmonary  edema. No pleural effusion or pneumothorax. Skeletal structures are intact. IMPRESSION: No acute cardiopulmonary disease. Electronically Signed   By: Lajean Manes M.D.   On: 12/22/2016 07:46   Dg Chest Portable 1 View  Result Date: 12/21/2016 CLINICAL DATA:  Cough, vomiting and abdominal pain. EXAM: PORTABLE CHEST 1 VIEW COMPARISON:  12/10/2016 FINDINGS: The heart is normal in size. The mediastinal and hilar contours are stable. Mild tortuosity of the thoracic aorta. The lungs are clear. No pleural effusion. The right IJ catheter has been removed. The bony thorax is intact. IMPRESSION: No acute cardiopulmonary findings. Electronically Signed   By: Marijo Sanes M.D.   On: 12/21/2016 12:21   Dg Chest Port 1 View  Result Date: 12/10/2016 CLINICAL DATA:  Status post central venous catheter placement prior to knee surgery. EXAM: PORTABLE CHEST 1 VIEW COMPARISON:  PA and lateral chest x-ray of September 10, 2016 FINDINGS: The lungs are well-expanded. There is no pneumothorax or pleural effusion. The interstitial markings are mildly prominent though stable. The cardiac silhouette is top-normal in size. The pulmonary vascularity is normal. There is calcification in the wall of the aortic arch. The right internal jugular venous catheter tip projects over the junction of the middle and distal thirds of the SVC. The observed bony thorax is unremarkable. IMPRESSION: There is no immediate postprocedure complication following placement of the right internal jugular venous catheter. The tip of the catheter projects at the junction of the middle and distal SVC. Mild chronic bronchitic changes. Thoracic aortic atherosclerosis. Electronically Signed   By: David  Martinique M.D.   On: 12/10/2016 11:39   Dg Abd Portable 1v  Result Date: 12/22/2016 CLINICAL DATA:  Last normal bowel movement 2 weeks ago. Constipation. EXAM: PORTABLE ABDOMEN - 1 VIEW COMPARISON:  CT, 12/21/2016. FINDINGS: There is no cough dilation to suggest  obstruction or significant adynamic ileus. There has been an interval decrease in stool in the rectum and left colon. There is still mild increased colonic stool in the right colon. No evidence of renal or ureteral stones. Soft tissues are unremarkable. Stable changes from previous lumbar spine fusion and right hip replacement. IMPRESSION: 1. There has been a decrease in stool in the rectum and left colon since the previous day's CT scan with persistent increased stool noted in the right colon. 2. No acute findings.  No evidence of bowel obstruction. Electronically Signed   By: Lajean Manes M.D.   On: 12/22/2016 07:45     CBC  Recent Labs Lab 12/22/16 0806 12/23/16 0218 12/24/16 0246 12/25/16 0700 12/26/16 0550  WBC 36.6* 34.5* 27.6* 17.9* 13.7*  HGB 9.7* 8.6* 8.3* 7.4* 9.1*  HCT 28.9* 26.7* 26.1* 23.9* 28.2*  PLT 692* 683* 696* 559* 525*  MCV 83.3 84.0 84.7 84.8  84.9  MCH 28.0 27.0 26.9 26.2 27.4  MCHC 33.6 32.2 31.8 31.0 32.3  RDW 15.0 15.4 15.6* 15.5 15.3  LYMPHSABS  --  3.1 3.0 3.7 3.1  MONOABS  --  2.8* 3.3* 2.0* 2.1*  EOSABS  --  0.0 0.3 0.3 0.5  BASOSABS  --  0.0 0.0 0.0 0.0    Chemistries   Recent Labs Lab 12/21/16 1154 12/22/16 0806 12/22/16 1147 12/23/16 0218 12/24/16 0246 12/25/16 0700 12/26/16 0550  NA 135 136  --  137 132* 131* 132*  K 3.6 3.2*  --  4.1 4.3 4.5 3.6  CL 95* 102  --  106 105 104 100*  CO2 20* 19*  --  19* 17* 18* 23  GLUCOSE 157* 152*  --  109* 105* 98 94  BUN 23* 17  --  19 20 16 15   CREATININE 1.81* 1.45*  --  1.42* 1.26* 1.19* 1.33*  CALCIUM 9.4 8.3*  --  8.4* 8.2* 8.1* 8.3*  MG  --   --  2.2  --  2.5*  --   --   AST 22  --   --  23  --  16  --   ALT 14  --   --  13*  --  11*  --   ALKPHOS 71  --   --  65  --  57  --   BILITOT 1.0  --   --  0.9  --  0.6  --    ------------------------------------------------------------------------------------------------------------------ estimated creatinine clearance is 32 mL/min (by C-G formula  based on SCr of 1.33 mg/dL (H)). ------------------------------------------------------------------------------------------------------------------ No results for input(s): HGBA1C in the last 72 hours. ------------------------------------------------------------------------------------------------------------------ No results for input(s): CHOL, HDL, LDLCALC, TRIG, CHOLHDL, LDLDIRECT in the last 72 hours. ------------------------------------------------------------------------------------------------------------------ No results for input(s): TSH, T4TOTAL, T3FREE, THYROIDAB in the last 72 hours.  Invalid input(s): FREET3 ------------------------------------------------------------------------------------------------------------------ No results for input(s): VITAMINB12, FOLATE, FERRITIN, TIBC, IRON, RETICCTPCT in the last 72 hours.  Coagulation profile  Recent Labs Lab 12/21/16 1154  INR 1.12    No results for input(s): DDIMER in the last 72 hours.  Cardiac Enzymes  Recent Labs Lab 12/22/16 0806 12/22/16 1147 12/22/16 1836  TROPONINI 0.29* 0.36* 0.31*   ------------------------------------------------------------------------------------------------------------------ Invalid input(s): POCBNP   CBG:  Recent Labs Lab 12/22/16 1952  GLUCAP 143*       Studies: No results found.    Lab Results  Component Value Date   HGBA1C 6.0 (H) 12/10/2016   HGBA1C 6.0 (H) 12/10/2016   HGBA1C 5.6 08/27/2016   Lab Results  Component Value Date   CREATININE 1.33 (H) 12/26/2016       Scheduled Meds: . atorvastatin  80 mg Oral q1800  . cefTRIAXone (ROCEPHIN)  IV  1 g Intravenous Q24H  . diltiazem  240 mg Oral Daily  . escitalopram  10 mg Oral Daily  . mirabegron ER  50 mg Oral Daily  . pantoprazole (PROTONIX) IV  40 mg Intravenous Q12H  . polyethylene glycol  17 g Oral Daily  . pregabalin  50 mg Oral BID  . rOPINIRole  0.5 mg Oral QHS  . sodium chloride  250 mL  Intravenous Once  . sodium chloride flush  3 mL Intravenous Q12H  . tamsulosin  0.4 mg Oral QPC breakfast   Continuous Infusions:    LOS: 4 days    Time spent: >30 MINS    [facility]  [facility] Pager [phone]. If 7PM-7AM, please contact night-coverage at [email or web address], password [facility] 12/26/2016, 8:27  AM  LOS: 4 days

## 2016-12-26 NOTE — Anesthesia Procedure Notes (Signed)
Procedure Name: MAC Date/Time: 12/26/2016 2:30 PM Performed by: Trixie Deis A Pre-anesthesia Checklist: Patient identified, Emergency Drugs available, Suction available, Patient being monitored and Timeout performed Patient Re-evaluated:Patient Re-evaluated prior to inductionOxygen Delivery Method: Nasal cannula Placement Confirmation: positive ETCO2

## 2016-12-26 NOTE — Progress Notes (Signed)
Physical Therapy Treatment Patient Details Name: Gloria Best MRN: [id number] DOB: Mar 07, 1941 Today's Date: 12/26/2016    History of Present Illness 76 y.o. 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    PT Comments    Patient tired from EGD procedure earlier in afternoon but agreeable to gait training and mobility. Improved ambulation distance from prior session but requires seated rest break due to fatigue. Performed there ex sitting EOB. Will continue to follow.    Follow Up Recommendations  Home health PT;Supervision for mobility/OOB;Supervision/Assistance - 24 hour     Equipment Recommendations  None recommended by PT    Recommendations for Other Services       Precautions / Restrictions Precautions Precautions: Knee;Fall Precaution Booklet Issued: No Restrictions Weight Bearing Restrictions: Yes RLE Weight Bearing: Weight bearing as tolerated    Mobility  Bed Mobility Overal bed mobility: Needs Assistance Bed Mobility: Supine to Sit;Sit to Supine     Supine to sit: Supervision Sit to supine: Supervision   General bed mobility comments: HOB minimally elevated. No physical assist, no use of bed rails, increased time.  Transfers Overall transfer level: Needs assistance Equipment used: Rolling walker (2 wheeled) Transfers: Sit to/from Stand Sit to Stand: Min guard         General transfer comment: Cues for hand placement with RW. Min guard for safety.  Ambulation/Gait Ambulation/Gait assistance: Min guard Ambulation Distance (Feet): 25 Feet (+ 20') Assistive device: Rolling walker (2 wheeled) Gait Pattern/deviations: Step-to pattern;Decreased stance time - right;Trunk flexed;Decreased weight shift to right;Step-through pattern Gait velocity:  decreased   General Gait Details: Cues for knee extension during stance phase of gait. 1 seated rest break due to fatigue. Cues for pursed lip breathing.   Stairs            Wheelchair Mobility    Modified Rankin (Stroke Patients Only)       Balance Overall balance assessment: Needs assistance Sitting-balance support: Feet supported;No upper extremity supported Sitting balance-Leahy Scale: Good Sitting balance - Comments: sitting EOB with no back support   Standing balance support: During functional activity Standing balance-Leahy Scale: Poor Standing balance comment: RW for support                    Cognition Arousal/Alertness: Awake/alert Behavior During Therapy: WFL for tasks assessed/performed Overall Cognitive Status: Within Functional Limits for tasks assessed                      Exercises Total Joint Exercises Ankle Circles/Pumps: Both;10 reps;Supine Quad Sets: Both;10 reps;Seated Heel Slides: Right;10 reps;Supine    General Comments        Pertinent Vitals/Pain Pain Assessment: Faces Faces Pain Scale: Hurts little more Pain Location: R knee Pain Descriptors / Indicators: Sore Pain Intervention(s): Monitored during session;Repositioned    Home Living                      Prior Function            PT Goals (current goals can now be found in the care plan section) Progress towards PT goals: Progressing toward goals    Frequency    Min 3X/week      PT Plan Current plan remains appropriate    Co-evaluation  End of Session Equipment Utilized During Treatment: Gait belt Activity Tolerance: Patient limited by fatigue Patient left: in bed;with call bell/phone within reach;with bed alarm set Nurse Communication: Mobility status PT Visit Diagnosis: Unsteadiness on feet (R26.81);Muscle weakness (generalized) (M62.81)     Time: 0211-1552 PT Time Calculation (min) (ACUTE ONLY): 15 min  Charges:   $Gait Training: 8-22 mins                    G Codes:       Leonela Kivi A Pennelope Basque 12/26/2016, 4:40 PM Wray Kearns, [place], DPT [phone]

## 2016-12-27 ENCOUNTER — Encounter (HOSPITAL_COMMUNITY): Payer: Self-pay | Admitting: Gastroenterology

## 2016-12-27 LAB — CBC
HEMATOCRIT: 28.4 % — AB (ref 36.0–46.0)
HEMOGLOBIN: 9.1 g/dL — AB (ref 12.0–15.0)
MCH: 27.2 pg (ref 26.0–34.0)
MCHC: 32 g/dL (ref 30.0–36.0)
MCV: 85 fL (ref 78.0–100.0)
Platelets: 486 10*3/uL — ABNORMAL HIGH (ref 150–400)
RBC: 3.34 MIL/uL — ABNORMAL LOW (ref 3.87–5.11)
RDW: 15.6 % — AB (ref 11.5–15.5)
WBC: 11.1 10*3/uL — AB (ref 4.0–10.5)

## 2016-12-27 LAB — DIFFERENTIAL
Basophils Absolute: 0 10*3/uL (ref 0.0–0.1)
Basophils Relative: 0 %
EOS ABS: 0.4 10*3/uL (ref 0.0–0.7)
Eosinophils Relative: 4 %
LYMPHS PCT: 32 %
Lymphs Abs: 3.6 10*3/uL (ref 0.7–4.0)
Monocytes Absolute: 1.6 10*3/uL — ABNORMAL HIGH (ref 0.1–1.0)
Monocytes Relative: 14 %
NEUTROS ABS: 5.5 10*3/uL (ref 1.7–7.7)
NEUTROS PCT: 50 %

## 2016-12-27 LAB — TYPE AND SCREEN
Blood Product Expiration Date: 201803172359
Blood Product Expiration Date: 201803202359
ISSUE DATE / TIME: 201802201154
ISSUE DATE / TIME: 201802201718
UNIT TYPE AND RH: 5100
Unit Type and Rh: 5100

## 2016-12-27 MED ORDER — APIXABAN 5 MG PO TABS: 5.0000 mg | ORAL_TABLET | Freq: Two times a day (BID) | ORAL | Status: DC

## 2016-12-27 MED ORDER — TAMSULOSIN HCL 0.4 MG PO CAPS: 0.4000 mg | ORAL_CAPSULE | Freq: Every day | ORAL | 0 refills | Status: DC

## 2016-12-27 MED ORDER — POLYETHYLENE GLYCOL 3350 17 G PO PACK: 17.0000 g | PACK | Freq: Every day | ORAL | 0 refills | Status: AC

## 2016-12-27 MED ORDER — DABIGATRAN ETEXILATE MESYLATE 75 MG PO CAPS
75.0000 mg | ORAL_CAPSULE | Freq: Two times a day (BID) | ORAL | Status: DC
  Administered 2016-12-27: 75 mg via ORAL
  Filled 2016-12-27 (×3): qty 1

## 2016-12-27 MED ORDER — DABIGATRAN ETEXILATE MESYLATE 75 MG PO CAPS: 75.0000 mg | ORAL_CAPSULE | Freq: Two times a day (BID) | ORAL | 2 refills | Status: DC

## 2016-12-27 MED ORDER — CEFDINIR 300 MG PO CAPS: 300.0000 mg | ORAL_CAPSULE | Freq: Two times a day (BID) | ORAL | 0 refills | Status: AC

## 2016-12-27 MED ORDER — PANTOPRAZOLE SODIUM 40 MG PO TBEC: 40.0000 mg | DELAYED_RELEASE_TABLET | Freq: Every day | ORAL | 0 refills | Status: DC

## 2016-12-27 NOTE — Discharge Summary (Addendum)
Physician Discharge Summary  [name] MRN: [id number] DOB/AGE: [id number] y.o.  PCP: Marco Collie, MD   Admit date: 12/21/2016 Discharge date: 12/27/2016  Discharge Diagnoses:    Active Problems:   Elevated WBC count   Hematemesis with nausea   Acute renal failure (ARF) (HCC)   Altered mental status   Constipated   Protein-calorie malnutrition, severe    Follow-up recommendations Follow-up with PCP in 3-5 days , including all  additional recommended appointments as below Follow-up CBC, CMP in 3-5 days Patient would need outpatient follow-up with GI to consider doing a colonoscopy, given that the patient is on long-term anticoagulation Patient would need to follow-up with cardiology in the outpatient setting for paroxysmal atrial fibrillation, contact information at the end of this discharge summary Follow hemoglobin closely Repeat hemoglobin A1c in 3 months     Current Discharge Medication List    START taking these medications   Details  cefdinir (OMNICEF) 300 MG capsule Take 1 capsule (300 mg total) by mouth 2 (two) times daily. Qty: 4 capsule, Refills: 0    pantoprazole (PROTONIX) 40 MG tablet Take 1 tablet (40 mg total) by mouth daily before breakfast. Qty: 30 tablet, Refills: 0    polyethylene glycol (MIRALAX / GLYCOLAX) packet Take 17 g by mouth daily. Qty: 14 each, Refills: 0    tamsulosin (FLOMAX) 0.4 MG CAPS capsule Take 1 capsule (0.4 mg total) by mouth daily after breakfast. Qty: 30 capsule, Refills: 0      CONTINUE these medications which have CHANGED   Details  dabigatran (PRADAXA) 75 MG CAPS capsule Take 1 capsule (75 mg total) by mouth every 12 (twelve) hours. Qty: 60 capsule, Refills: 2      CONTINUE these medications which have NOT CHANGED   Details  acetaminophen (TYLENOL) 500 MG tablet Take 500 mg by mouth every 8 (eight) hours as needed (for pain).     ARIPiprazole (ABILIFY) 5 MG tablet Take 5 mg by mouth every evening.      atorvastatin (LIPITOR) 80 MG tablet Take 80 mg by mouth daily at 6 PM.     cloNIDine (CATAPRES) 0.2 MG tablet Take 0.2 mg by mouth 2 (two) times daily.    COMBIVENT RESPIMAT 20-100 MCG/ACT AERS respimat Inhale 1 puff into the lungs every 6 (six) hours as needed for wheezing or shortness of breath. For shortness of breath.    diclofenac sodium (VOLTAREN) 1 % GEL Apply 2 g topically 4 (four) times daily as needed. For pain.    diltiazem (CARDIZEM CD) 240 MG 24 hr capsule Take 240 mg by mouth daily.    escitalopram (LEXAPRO) 10 MG tablet Take 10 mg by mouth daily.    ferrous sulfate 325 (65 FE) MG tablet Take 1 tablet (325 mg total) by mouth 2 (two) times daily with a meal. Qty: 60 tablet, Refills: 3    folic acid (FOLVITE) 1 MG tablet Take 1 mg by mouth every evening.     furosemide (LASIX) 40 MG tablet Take 40 mg by mouth daily.    gabapentin (NEURONTIN) 100 MG capsule Take 100 mg by mouth 2 (two) times daily.    HYDROcodone-acetaminophen (NORCO/VICODIN) 5-325 MG tablet Take 1 tablet by mouth every 6 (six) hours as needed for moderate pain.    hydroxychloroquine (PLAQUENIL) 200 MG tablet Take 200 mg by mouth 2 (two) times daily.     hydroxypropyl methylcellulose / hypromellose (ISOPTO TEARS / GONIOVISC) 2.5 % ophthalmic solution Place 1-2 drops into both eyes 3 (  three) times daily as needed for dry eyes.    potassium chloride SA (K-DUR,KLOR-CON) 20 MEQ tablet Take 20 mEq by mouth daily.    pramipexole (MIRAPEX) 0.5 MG tablet Take 0.5 mg by mouth at bedtime.    pregabalin (LYRICA) 50 MG capsule Take 50 mg by mouth 2 (two) times daily.    Vitamin D, Ergocalciferol, (DRISDOL) 50000 units CAPS capsule Take 50,000 Units by mouth every 30 (thirty) days.      STOP taking these medications     doxycycline (VIBRA-TABS) 100 MG tablet      MYRBETRIQ 50 MG TB24 tablet      oxybutynin (DITROPAN) 5 MG tablet      RABEprazole (ACIPHEX) 20 MG tablet      ranitidine (ZANTAC) 300 MG  tablet           Discharge Condition: *Stable   Discharge Instructions Get Medicines reviewed and adjusted: Please take all your medications with you for your next visit with your Primary MD  Please request your Primary MD to go over all hospital tests and procedure/radiological results at the follow up, please ask your Primary MD to get all Hospital records sent to his/her office.  If you experience worsening of your admission symptoms, develop shortness of breath, life threatening emergency, suicidal or homicidal thoughts you must seek medical attention immediately by calling 911 or calling your MD immediately if symptoms less severe.  You must read complete instructions/literature along with all the possible adverse reactions/side effects for all the Medicines you take and that have been prescribed to you. Take any new Medicines after you have completely understood and accpet all the possible adverse reactions/side effects.   Do not drive when taking Pain medications.   Do not take more than prescribed Pain, Sleep and Anxiety Medications  Special Instructions: If you have smoked or chewed Tobacco in the last 2 yrs please stop smoking, stop any regular Alcohol and or any Recreational drug use.  Wear Seat belts while driving.  Please note  You were cared for by a hospitalist during your hospital stay. Once you are discharged, your primary care physician will handle any further medical issues. Please note that NO REFILLS for any discharge medications will be authorized once you are discharged, as it is imperative that you return to your primary care physician (or establish a relationship with a primary care physician if you do not have one) for your aftercare needs so that they can reassess your need for medications and monitor your lab values.     Allergies  Allergen Reactions  . Adhesive [Tape] Other (See Comments)    Tears skin  . Latex Other (See Comments)    Blisters   . Lotrel [Amlodipine Besy-Benazepril Hcl] Itching and Cough      Disposition: Home health    Consults: GI *  2-D echo LV EF: 60% -   65%  ------------------------------------------------------------------- Indications:      Abnormal EKG 794.31.  ------------------------------------------------------------------- History:   Risk factors:  Chronic kidney disease. Sleep apnea. Hematemesis. Hypertension. Diabetes mellitus.  ------------------------------------------------------------------- Study Conclusions  - Left ventricle: The cavity size was normal. Wall thickness was   normal. Systolic function was normal. The estimated ejection   fraction was in the range of 60% to 65%. Left ventricular   diastolic function parameters were normal. - Aortic valve: There was mild regurgitation. Valve area (VTI):   1.87 cm^2. Valve area (Vmax): 1.79 cm^2. Valve area (Vmean): 1.77   cm^2. - Mitral valve:  Calcified annulus. There was moderate   regurgitation. - Left atrium: The atrium was moderately dilated. - Atrial septum: No defect or patent foramen ovale was identified. - Pulmonary arteries: PA peak pressure: 56 mm Hg   Significant Diagnostic Studies:  Ct Abdomen Pelvis Wo Contrast  Result Date: 12/21/2016 CLINICAL DATA:  Acute mid abdominal pain. EXAM: CT ABDOMEN AND PELVIS WITHOUT CONTRAST TECHNIQUE: Multidetector CT imaging of the abdomen and pelvis was performed following the standard protocol without IV contrast. COMPARISON:  CT scan of September 18, 2014. FINDINGS: Lower chest: Mild bilateral posterior basilar subsegmental atelectasis is noted. Hepatobiliary: No focal liver abnormality is seen. No gallstones, gallbladder wall thickening, or biliary dilatation. Pancreas: Unremarkable. No pancreatic ductal dilatation or surrounding inflammatory changes. Spleen: Normal in size without focal abnormality. Adrenals/Urinary Tract: Adrenal glands appear normal. No hydronephrosis or  renal obstruction is noted. No renal or ureteral calculi are noted. Urinary bladder is decompressed secondary to Foley catheter. Stomach/Bowel: Large amount of stool is noted throughout the colon and rectum concerning for impaction. No small bowel dilatation is noted. Vascular/Lymphatic: Aortic atherosclerosis. No enlarged abdominal or pelvic lymph nodes. Reproductive: Status post hysterectomy. No adnexal masses. Other: No abdominal wall hernia or abnormality. No abdominopelvic ascites. Musculoskeletal: Status post right hip arthroplasty. Status post surgical fusion of lower lumbar spine. IMPRESSION: Aortic atherosclerosis. Large amount of stool seen throughout the colon and rectum concerning for rectal impaction. No other significant abnormality seen in the abdomen or pelvis. Electronically Signed   By: Marijo Conception, M.D.   On: 12/21/2016 14:56   Ct Head Wo Contrast  Result Date: 12/22/2016 CLINICAL DATA:  Altered mental status, history diabetes mellitus, dysrhythmia, hypertension, former smoker, asthma EXAM: CT HEAD WITHOUT CONTRAST TECHNIQUE: Contiguous axial images were obtained from the base of the skull through the vertex without intravenous contrast. COMPARISON:  11/24/2011 FINDINGS: Brain: Generalized atrophy. Normal ventricular morphology. No midline shift or mass effect. Small vessel chronic ischemic changes of deep cerebral white matter. No intracranial hemorrhage, mass lesion, evidence of acute infarction, or extra-axial fluid collection. Vascular: Mild atherosclerotic calcifications at the carotid siphons Skull: Intact Sinuses/Orbits: Clear Other: N/A IMPRESSION: Atrophy with small vessel chronic ischemic changes of deep cerebral white matter. No acute intracranial abnormalities. Electronically Signed   By: Lavonia Dana M.D.   On: 12/22/2016 16:41   US Renal  Result Date: 12/23/2016 CLINICAL DATA:  Patient with elevated creatinine. EXAM: RENAL / URINARY TRACT ULTRASOUND COMPLETE COMPARISON:   CT abdomen pelvis 12/21/2016 FINDINGS: Right Kidney: Length: 9.7 cm. Diffusely increased in echogenicity. Normal cortical thickness. No hydronephrosis. Left Kidney: Length: 8.9 cm. Diffusely increased in echogenicity. Normal cortical thickness. No hydronephrosis. Bladder: Appears normal for degree of bladder distention. IMPRESSION: No hydronephrosis. Diffusely increased renal cortical echogenicity as can be seen with chronic medical renal disease. Electronically Signed   By: Lovey Newcomer M.D.   On: 12/23/2016 19:46   Portable Chest 1 View  Result Date: 12/22/2016 CLINICAL DATA:  Elevated white blood cell count. No shortness of breath. History of diabetes and hypertension. EXAM: PORTABLE CHEST 1 VIEW COMPARISON:  12/21/2016. FINDINGS: Cardiac silhouette is normal in size. No mediastinal or hilar masses. No convincing adenopathy. Mild increased bronchovascular markings are noted bilaterally most evident at the medial lung bases. There is likely a component of atelectasis at the medial lung bases. No convincing pneumonia. No pulmonary edema. No pleural effusion or pneumothorax. Skeletal structures are intact. IMPRESSION: No acute cardiopulmonary disease. Electronically Signed   By: Dedra Skeens.D.  On: 12/22/2016 07:46   Dg Chest Portable 1 View  Result Date: 12/21/2016 CLINICAL DATA:  Cough, vomiting and abdominal pain. EXAM: PORTABLE CHEST 1 VIEW COMPARISON:  12/10/2016 FINDINGS: The heart is normal in size. The mediastinal and hilar contours are stable. Mild tortuosity of the thoracic aorta. The lungs are clear. No pleural effusion. The right IJ catheter has been removed. The bony thorax is intact. IMPRESSION: No acute cardiopulmonary findings. Electronically Signed   By: Marijo Sanes M.D.   On: 12/21/2016 12:21   Dg Chest Port 1 View  Result Date: 12/10/2016 CLINICAL DATA:  Status post central venous catheter placement prior to knee surgery. EXAM: PORTABLE CHEST 1 VIEW COMPARISON:  PA and lateral  chest x-ray of September 10, 2016 FINDINGS: The lungs are well-expanded. There is no pneumothorax or pleural effusion. The interstitial markings are mildly prominent though stable. The cardiac silhouette is top-normal in size. The pulmonary vascularity is normal. There is calcification in the wall of the aortic arch. The right internal jugular venous catheter tip projects over the junction of the middle and distal thirds of the SVC. The observed bony thorax is unremarkable. IMPRESSION: There is no immediate postprocedure complication following placement of the right internal jugular venous catheter. The tip of the catheter projects at the junction of the middle and distal SVC. Mild chronic bronchitic changes. Thoracic aortic atherosclerosis. Electronically Signed   By: David  Martinique M.D.   On: 12/10/2016 11:39   Dg Abd Portable 1v  Result Date: 12/22/2016 CLINICAL DATA:  Last normal bowel movement 2 weeks ago. Constipation. EXAM: PORTABLE ABDOMEN - 1 VIEW COMPARISON:  CT, 12/21/2016. FINDINGS: There is no cough dilation to suggest obstruction or significant adynamic ileus. There has been an interval decrease in stool in the rectum and left colon. There is still mild increased colonic stool in the right colon. No evidence of renal or ureteral stones. Soft tissues are unremarkable. Stable changes from previous lumbar spine fusion and right hip replacement. IMPRESSION: 1. There has been a decrease in stool in the rectum and left colon since the previous day's CT scan with persistent increased stool noted in the right colon. 2. No acute findings.  No evidence of bowel obstruction. Electronically Signed   By: Lajean Manes M.D.   On: 12/22/2016 07:45    Filed Weights   12/25/16 0605 12/26/16 0515 12/27/16 0443  Weight: 72.6 kg (160 lb 0.9 oz) 72.8 kg (160 lb 9.6 oz) 73.2 kg (161 lb 6.4 oz)     Microbiology: Recent Results (from the past 240 hour(s))  Culture, blood (routine x 2)     Status: None    Collection Time: 12/21/16  4:36 PM  Result Value Ref Range Status   Specimen Description BLOOD RIGHT HAND  Final   Special Requests BOTTLES DRAWN AEROBIC AND ANAEROBIC 5CC  Final   Culture NO GROWTH 5 DAYS  Final   Report Status 12/26/2016 FINAL  Final  Culture, blood (routine x 2)     Status: None   Collection Time: 12/21/16  4:36 PM  Result Value Ref Range Status   Specimen Description BLOOD LEFT HAND  Final   Special Requests AEROBIC BOTTLE ONLY  5CC  Final   Culture NO GROWTH 5 DAYS  Final   Report Status 12/26/2016 FINAL  Final  MRSA PCR Screening     Status: None   Collection Time: 12/21/16  7:01 PM  Result Value Ref Range Status   MRSA by PCR NEGATIVE NEGATIVE Final  Comment:        The GeneXpert MRSA Assay (FDA approved for NASAL specimens only), is one component of a comprehensive MRSA colonization surveillance program. It is not intended to diagnose MRSA infection nor to guide or monitor treatment for MRSA infections.   Gastrointestinal Panel by PCR , Stool     Status: None   Collection Time: 12/22/16 11:44 AM  Result Value Ref Range Status   Campylobacter species NOT DETECTED NOT DETECTED Final   Plesimonas shigelloides NOT DETECTED NOT DETECTED Final   Salmonella species NOT DETECTED NOT DETECTED Final   Yersinia enterocolitica NOT DETECTED NOT DETECTED Final   Vibrio species NOT DETECTED NOT DETECTED Final   Vibrio cholerae NOT DETECTED NOT DETECTED Final   Enteroaggregative E coli (EAEC) NOT DETECTED NOT DETECTED Final   Enteropathogenic E coli (EPEC) NOT DETECTED NOT DETECTED Final   Enterotoxigenic E coli (ETEC) NOT DETECTED NOT DETECTED Final   Shiga like toxin producing E coli (STEC) NOT DETECTED NOT DETECTED Final   Shigella/Enteroinvasive E coli (EIEC) NOT DETECTED NOT DETECTED Final   Cryptosporidium NOT DETECTED NOT DETECTED Final   Cyclospora cayetanensis NOT DETECTED NOT DETECTED Final   Entamoeba histolytica NOT DETECTED NOT DETECTED Final    Giardia lamblia NOT DETECTED NOT DETECTED Final   Adenovirus F40/41 NOT DETECTED NOT DETECTED Final   Astrovirus NOT DETECTED NOT DETECTED Final   Norovirus GI/GII NOT DETECTED NOT DETECTED Final   Rotavirus A NOT DETECTED NOT DETECTED Final   Sapovirus (I, II, IV, and V) NOT DETECTED NOT DETECTED Final  Urine culture     Status: None   Collection Time: 12/24/16  5:39 PM  Result Value Ref Range Status   Specimen Description URINE, CATHETERIZED  Final   Special Requests NONE  Final   Culture NO GROWTH  Final   Report Status 12/25/2016 FINAL  Final       Blood Culture    Component Value Date/Time   SDES URINE, CATHETERIZED 12/24/2016 1739   SPECREQUEST NONE 12/24/2016 1739   CULT NO GROWTH 12/24/2016 1739   REPTSTATUS 12/25/2016 FINAL 12/24/2016 1739      Labs: Results for orders placed or performed during the hospital encounter of 12/21/16 (from the past 48 hour(s))  Type and screen [place]     Status: None   Collection Time: 12/25/16  9:50 AM  Result Value Ref Range   ISSUE DATE / TIME [id number]    Blood Product Unit Number [id number]    PRODUCT CODE E0336V00    Unit Type and Rh 5100    Blood Product Expiration Date [id number]    ISSUE DATE / TIME [id number]    Blood Product Unit Number [id number]    Unit Type and Rh 5100    Blood Product Expiration Date [id number]   Prepare RBC     Status: None   Collection Time: 12/25/16  9:50 AM  Result Value Ref Range   Order Confirmation ORDER PROCESSED BY BLOOD BANK   Differential     Status: Abnormal   Collection Time: 12/26/16  5:50 AM  Result Value Ref Range   Neutrophils Relative % 59 %   Neutro Abs 8.0 (H) 1.7 - 7.7 K/uL   Lymphocytes Relative 22 %   Lymphs Abs 3.1 0.7 - 4.0 K/uL   Monocytes Relative 15 %   Monocytes Absolute 2.1 (H) 0.1 - 1.0 K/uL   Eosinophils Relative 4 %   Eosinophils Absolute 0.5 0.0 - 0.7 K/uL  Basophils Relative 0 %   Basophils Absolute 0.0 0.0 - 0.1 K/uL   CBC     Status: Abnormal   Collection Time: 12/26/16  5:50 AM  Result Value Ref Range   WBC 13.7 (H) 4.0 - 10.5 K/uL   RBC 3.32 (L) 3.87 - 5.11 MIL/uL   Hemoglobin 9.1 (L) 12.0 - 15.0 g/dL   HCT 28.2 (L) 36.0 - 46.0 %   MCV 84.9 78.0 - 100.0 fL   MCH 27.4 26.0 - 34.0 pg   MCHC 32.3 30.0 - 36.0 g/dL   RDW 15.3 11.5 - 15.5 %   Platelets 525 (H) 150 - 400 K/uL  Basic metabolic panel     Status: Abnormal   Collection Time: 12/26/16  5:50 AM  Result Value Ref Range   Sodium 132 (L) 135 - 145 mmol/L   Potassium 3.6 3.5 - 5.1 mmol/L    Comment: NO VISIBLE HEMOLYSIS   Chloride 100 (L) 101 - 111 mmol/L   CO2 23 22 - 32 mmol/L   Glucose, Bld 94 65 - 99 mg/dL   BUN 15 6 - 20 mg/dL   Creatinine, Ser 1.33 (H) 0.44 - 1.00 mg/dL   Calcium 8.3 (L) 8.9 - 10.3 mg/dL   GFR calc non Af Amer 38 (L) >60 mL/min   GFR calc Af Amer 44 (L) >60 mL/min    Comment: (NOTE) The eGFR has been calculated using the CKD EPI equation. This calculation has not been validated in all clinical situations. eGFR's persistently <60 mL/min signify possible Chronic Kidney Disease.    Anion gap 9 5 - 15  Differential     Status: Abnormal   Collection Time: 12/27/16  3:15 AM  Result Value Ref Range   Neutrophils Relative % 50 %   Lymphocytes Relative 32 %   Monocytes Relative 14 %   Eosinophils Relative 4 %   Basophils Relative 0 %   Neutro Abs 5.5 1.7 - 7.7 K/uL   Lymphs Abs 3.6 0.7 - 4.0 K/uL   Monocytes Absolute 1.6 (H) 0.1 - 1.0 K/uL   Eosinophils Absolute 0.4 0.0 - 0.7 K/uL   Basophils Absolute 0.0 0.0 - 0.1 K/uL   RBC Morphology POLYCHROMASIA PRESENT   CBC     Status: Abnormal   Collection Time: 12/27/16  3:15 AM  Result Value Ref Range   WBC 11.1 (H) 4.0 - 10.5 K/uL   RBC 3.34 (L) 3.87 - 5.11 MIL/uL   Hemoglobin 9.1 (L) 12.0 - 15.0 g/dL   HCT 28.4 (L) 36.0 - 46.0 %   MCV 85.0 78.0 - 100.0 fL   MCH 27.2 26.0 - 34.0 pg   MCHC 32.0 30.0 - 36.0 g/dL   RDW 15.6 (H) 11.5 - 15.5 %   Platelets 486 (H)  150 - 400 K/uL     Lipid Panel  No results found for: CHOL, TRIG, HDL, CHOLHDL, VLDL, LDLCALC, LDLDIRECT   Lab Results  Component Value Date   HGBA1C 6.0 (H) 12/10/2016   HGBA1C 6.0 (H) 12/10/2016   HGBA1C 5.6 08/27/2016      HPI :*   Gloria Best is a 76 y.o. female.  PMH DM 2.  OSA, not on CPAP.  HLD. Fibromyalgia.  Neurogenic bladder. P Afib on Pradaxa for 2 years, followed by [facility] cardiology.  RA on monthly Remicade, was due infusion 12/12/16 but due to TKR, infusion delayed for 3 weeks.  S/p right THR, chronic urinary tract infection, urinary retention after last hospital discharge requiring  indwelling Foley, who presented from nursing home.Per report from EDP patient was sent over for multiple episodes of emesis earlier today. One sample was checked and was positive for blood.  . EDP recommended consulting GI for upper GI bleed. Patient has had multiple normal bowel movements in the emergency room. CT of the abdomen and pelvis showed significant constipation.  Readmitted 2/16 with AMS, urosepsis, SVT/afib, hypokalemia, constipation, n/v and reports of blood in emesis PTA.  Mental status improved following administration of Narcan. Had multiple BMs in the ED following glycerin suppository. Now on daily MiraLAX.Marland Kitchen   Hgb 9.9 .. 8.6.. 8.3.. 7.4.  , status post packed red blood cell transfusion on 2/20. Gastroenterology consulted for further evaluation. CT abdomen pelvis without contrast to/16 showed aortic afterload sclerosis, large volume stool throughout colon and rectum, question fecal impaction. No other abdominal/pelvic abnormality. CT of the head showed chronic ischemic changes, atrophy.  HOSPITAL COURSE: *  Sepsis, likely secondary to urinary source, patient has had a indwelling Foley since surgery for urinary retention  White blood cell count significantly elevated on admission-around 36,000, likely leukemoid reaction, slowly improving, now down to 11.1. Initiated on sepsis  protocol Follow blood culture 2, urine culture , no growth so far, all Discontinued Foley after admission, CT abdomen pelvis showed Large amount of stool seen throughout the colon and rectum concerning for rectal impaction  Chest x-ray negative GI panel checked due to nausea with vomiting 2 days was negative Initially placed on Zosyn and vancomycin Antibiotics have been narrowed down to Rocephin, and now Omnicef Sepsis physiology resolved    Urinary retention, resolved Renal ultrasound-no urinary retention, no hydronephrosis,Diffusely increased renal cortical echogenicity as can be seen with chronic medical renal disease. Foley discontinued Held Pe Ell as both can cause retention, reviewed meds with pharmacy Treated with  , in and out cath, post void residuals   monitoring Started on flomax Urinary retention seems to have resolved   Hematemesis, anemia patient denies any GI bleeding,  baseline between 9-10, hemoglobin now 7.4, no signs of active bleeding for several days   FOBT positive UA negative for blood  transfused 1 unit on 2/20 given borderline elevated troponin, repeat hemoglobin stable around 9.1, status post EGD which was normal Consider outpatient colonoscopy as FOBT was positive Continue PPI      SVT/paroxysmal atrial fibrillation, in the setting of sepsis, resolved Sustained for several hours , now  converted With  IV metoprolol, magnesium,K   Repletion cardiac enzymes borderline,   2-D echo to rule out wall motion abnormalities, none seen May need ischemic workup in the future Continue Cardizem, did not need IV Cardizem as the patient spontaneously converted after repletion of electrolytes Continue Pradaxa for  atrial fibrillation Venous Doppler negative for DVT    Leukocytosis Bandemia, left shift secondary to infection Improving   Hypertension Cont cardizem Resume Lasix   Diabetes Most recent hemoglobin A1c was 6 Continue current  regimen Repeat hemoglobin A1c in 3 months  Hypokalemia-repleted  GERD Cont PPI/H2 blocker  Anxiety/depression Resume lexapro   HTN Hypertensive continue current meds    Constipation, 6 bowel movements since admission overnight Cleaned rectum WITH combination of suppositories and enemas daily MiraLAX  Holding constipating meds  Hyperlipidemia Continue statin  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 rate 13, height  5' (1.524 m), weight 73.2 kg (161 lb 6.4 oz), SpO2 95 %.  Heart: RRR. No MRG. S1, S2 present. Abdomen:  Soft. No masses. No HSM. Nontender. Bowel sounds active. No HSM or hernias..   Rectal: Deferred.   Musc/Skeltl: Bandage covers the scar on the right knee. Extremities:  No CCE.  Neurologic:  Alert. Oriented times 3. No tremor, moves all 4 limbs, limb strength was not tested. Skin:  No telangiectasias, rashes or sores.    Follow-up Information    Cardiology. Schedule an appointment as soon as possible for a visit.   Why:  In 3-5 days Contact information: [place]   [address] [place], [place] [address]   [place], [place], [place]   [place], [place] [address]   [phone]   [phone] (Fax)           Marco Collie, MD. Call.   Specialty:  Family Medicine Why:  To make follow-up appointment in 3-5 days Contact information: [place]. [place] [address] [phone]           Signed: Reyne Dumas 12/27/2016, 8:40 AM        Time spent >45 mins

## 2016-12-27 NOTE — Care Management (Addendum)
Case Management Note Initial Note Started by Tomi Bamberger, RN,BSN   Patient Details  Name: Gloria Best MRN: [id number]  Date of Birth: [birth year]  Subjective/Objective:  Presents from [facility] s/p RTK 2/5, with sepsis secondary to urinary source, hematemesis, svt/afib, leukocytosis, htn, dm, anxiety/depression, htn, hld.  Patient states she has medication coverage and she has a pcp, she states she does not plan to go back to [place] , she plans to go home with her daughter, Gloria Best [phone],  in [place] with [facility] services.  She has a bsc, shower chair, rolling walker and a w/chair at home.  She states she has had [facility] in the past and would like to have them again.  Patient will benefit from pt/ot eval.  NCM made referral to [facility] for [facility], PT, [place] and aide.  NCM will cont to follow for dc needs.                   Action/Plan:   Expected Discharge Date:                         Expected Discharge Plan:  [place]  In-House Referral:     Discharge planning Services  CM Consult  Post Acute Care Choice:    Choice offered to:  Patient  DME Arranged:  N/A DME Agency:   N/A  HH Arranged:  RN, PT, OT, Nurse's Aide [place] Agency:  [facility]  Status of Service: Completed  If discussed at [place] of Stay Meetings, dates discussed: 12-27-16  Additional Comments: 1225 12-27-16 Jacqlyn Krauss, RN,BSN [phone]  Previously admitted from [facility]. Refusing SNF at this time. Pt is still agreeable to [facility]. [facility] will follow up with community needs. Pt states her grandson will provide transportation to home. No DME needs at this time.

## 2016-12-27 NOTE — Progress Notes (Signed)
Dr Allyson Sabal at bedside states pt is being d/c home and does not need PIV.

## 2016-12-27 NOTE — Progress Notes (Signed)
ANTICOAGULATION CONSULT NOTE - Initial Consult  Pharmacy Consult for Pradaxa Indication: atrial fibrillation  Allergies  Allergen Reactions  . Adhesive [Tape] Other (See Comments)    Tears skin  . Latex Other (See Comments)    Blisters  . Lotrel [Amlodipine Besy-Benazepril Hcl] Itching and Cough    Patient Measurements: Height: 5' (152.4 cm) Weight: 161 lb 6.4 oz (73.2 kg) IBW/kg (Calculated) : 45.5  Vital Signs: Temp: 98.4 F (36.9 C) (02/22 1422) Temp Source: Oral (02/22 1422) BP: 143/47 (02/22 1422) Pulse Rate: 67 (02/22 1422)  Labs:  Recent Labs  12/25/16 0700 12/26/16 0550 12/27/16 0315  HGB 7.4* 9.1* 9.1*  HCT 23.9* 28.2* 28.4*  PLT 559* 525* 486*  CREATININE 1.19* 1.33*  --     Estimated Creatinine Clearance: 32.2 mL/min (by C-G formula based on SCr of 1.33 mg/dL (H)).   Assessment:  50 YOF on pradaxa PTA for afib, admitted with hematemesis, s/p EGD - wnl, ok to restart pradaxa today venous doppler neg for DVT.  Noted per med history, pt was taking pradaxa 150 mg every other day, which is not a correct dose. Scr 1.33 (baseline 1.5-1.8), est. crcl ~ 30 ml/min. Plan for discharge today  Goal of Therapy:  Monitor platelets by anticoagulation protocol: Yes   Plan:  Pradaxa 75mg  po BID Monitor CBC  Maryanna Shape, PharmD, BCPS  Clinical Pharmacist  Pager: [phone]   12/27/2016,2:59 PM

## 2016-12-27 NOTE — Consult Note (Signed)
   [facility] CM Inpatient Consult   12/27/2016  GENAVIEVE MANGIAPANE [birth year] [id number]      Came to visit Ms. Mcewen to discuss [place] [facility] program. She endorses that she plans on going home instead of returning to SNF as she states the facility she was recently at was "lousy". Discussed that she could have chosen a different facility. Ms. Warrior states " I am not going anywhere else but home". Writer proceeded with explaining [place] [facility] program. She was agreeable and written consent obtained. Explained that [facility] will not interfere or replace services with home health.  States she lives with her grandson and daughter. Her 20 year old grandson is with her during the day and she states he assists her as needed. Denies having any issues with transportation or medications. Confirms Primary Care MD is Dr. Nyra Capes. Confirmed best contact number as [phone].   Since recommendation was for SNF. Will request for [place] follow up and will request [facility] RNCM to assess for [facility] LCSW needs upon transition of care calls.   Ms. Bagsby has history of DM, OSA, HLD, fibromyalgia, Afib. Has had x2 hospitalizations in past 6 months.   Marthenia Rolling, MSN-Ed, RN,BSN [facility] Liaison [phone]

## 2016-12-27 NOTE — Progress Notes (Signed)
NURSING PROGRESS NOTE  MICAILA ZIEMBA [id number] Discharge Data: 12/27/2016 3:59 PM Attending Provider: Reyne Dumas, MD BTD:VVOHYW,VPXT, MD     Gloria Best to be D/C'd Home per MD order.  Discussed with the patient the After Visit Summary and all questions fully answered. All IV's discontinued with no bleeding noted. All belongings returned to patient for patient to take home.   Last Vital Signs:  Blood pressure (!) 143/47, pulse 67, temperature 98.4 F (36.9 C), temperature source Oral, resp. rate (!) 21, height 5' (1.524 m), weight 73.2 kg (161 lb 6.4 oz), SpO2 93 %.  Discharge Medication List Allergies as of 12/27/2016      Reactions   Adhesive [tape] Other (See Comments)   Tears skin   Latex Other (See Comments)   Blisters   Lotrel [amlodipine Besy-benazepril Hcl] Itching, Cough      Medication List    STOP taking these medications   doxycycline 100 MG tablet Commonly known as:  VIBRA-TABS   MYRBETRIQ 50 MG Tb24 tablet Generic drug:  mirabegron ER   oxybutynin 5 MG tablet Commonly known as:  DITROPAN   RABEprazole 20 MG tablet Commonly known as:  ACIPHEX   ranitidine 300 MG tablet Commonly known as:  ZANTAC     TAKE these medications   acetaminophen 500 MG tablet Commonly known as:  TYLENOL Take 500 mg by mouth every 8 (eight) hours as needed (for pain).   ARIPiprazole 5 MG tablet Commonly known as:  ABILIFY Take 5 mg by mouth every evening.   atorvastatin 80 MG tablet Commonly known as:  LIPITOR Take 80 mg by mouth daily at 6 PM.   cefdinir 300 MG capsule Commonly known as:  OMNICEF Take 1 capsule (300 mg total) by mouth 2 (two) times daily.   cloNIDine 0.2 MG tablet Commonly known as:  CATAPRES Take 0.2 mg by mouth 2 (two) times daily.   COMBIVENT RESPIMAT 20-100 MCG/ACT Aers respimat Generic drug:  Ipratropium-Albuterol Inhale 1 puff into the lungs every 6 (six) hours as needed for wheezing or shortness of breath. For shortness of  breath.   dabigatran 75 MG Caps capsule Commonly known as:  PRADAXA Take 1 capsule (75 mg total) by mouth every 12 (twelve) hours. What changed:  medication strength  how much to take  when to take this  additional instructions   diclofenac sodium 1 % Gel Commonly known as:  VOLTAREN Apply 2 g topically 4 (four) times daily as needed. For pain.   diltiazem 240 MG 24 hr capsule Commonly known as:  CARDIZEM CD Take 240 mg by mouth daily.   escitalopram 10 MG tablet Commonly known as:  LEXAPRO Take 10 mg by mouth daily.   ferrous sulfate 325 (65 FE) MG tablet Take 1 tablet (325 mg total) by mouth 2 (two) times daily with a meal.   folic acid 1 MG tablet Commonly known as:  FOLVITE Take 1 mg by mouth every evening.   furosemide 40 MG tablet Commonly known as:  LASIX Take 40 mg by mouth daily.   gabapentin 100 MG capsule Commonly known as:  NEURONTIN Take 100 mg by mouth 2 (two) times daily.   HYDROcodone-acetaminophen 5-325 MG tablet Commonly known as:  NORCO/VICODIN Take 1 tablet by mouth every 6 (six) hours as needed for moderate pain.   hydroxychloroquine 200 MG tablet Commonly known as:  PLAQUENIL Take 200 mg by mouth 2 (two) times daily.   hydroxypropyl methylcellulose / hypromellose 2.5 % ophthalmic solution  Commonly known as:  ISOPTO TEARS / GONIOVISC Place 1-2 drops into both eyes 3 (three) times daily as needed for dry eyes.   pantoprazole 40 MG tablet Commonly known as:  PROTONIX Take 1 tablet (40 mg total) by mouth daily before breakfast. Start taking on:  12/28/2016   polyethylene glycol packet Commonly known as:  MIRALAX / GLYCOLAX Take 17 g by mouth daily.   potassium chloride SA 20 MEQ tablet Commonly known as:  K-DUR,KLOR-CON Take 20 mEq by mouth daily.   pramipexole 0.5 MG tablet Commonly known as:  MIRAPEX Take 0.5 mg by mouth at bedtime.   pregabalin 50 MG capsule Commonly known as:  LYRICA Take 50 mg by mouth 2 (two) times  daily.   tamsulosin 0.4 MG Caps capsule Commonly known as:  FLOMAX Take 1 capsule (0.4 mg total) by mouth daily after breakfast. Start taking on:  12/28/2016   Vitamin D (Ergocalciferol) 50000 units Caps capsule Commonly known as:  DRISDOL Take 50,000 Units by mouth every 30 (thirty) days.            Durable Medical Equipment        Start     Ordered   12/24/16 1333  For home use only DME Walker rolling  Once    Question:  Patient needs a walker to treat with the following condition  Answer:  Sepsis ([place])   12/24/16 1332   12/24/16 1332  For home use only DME 3 n 1  Once     12/24/16 1332

## 2016-12-28 ENCOUNTER — Other Ambulatory Visit: Payer: Self-pay

## 2016-12-28 DIAGNOSIS — D649 Anemia, unspecified: Secondary | ICD-10-CM | POA: Diagnosis not present

## 2016-12-28 DIAGNOSIS — K219 Gastro-esophageal reflux disease without esophagitis: Secondary | ICD-10-CM | POA: Diagnosis not present

## 2016-12-28 DIAGNOSIS — G4733 Obstructive sleep apnea (adult) (pediatric): Secondary | ICD-10-CM | POA: Diagnosis not present

## 2016-12-28 DIAGNOSIS — F419 Anxiety disorder, unspecified: Secondary | ICD-10-CM | POA: Diagnosis not present

## 2016-12-28 DIAGNOSIS — M069 Rheumatoid arthritis, unspecified: Secondary | ICD-10-CM | POA: Diagnosis not present

## 2016-12-28 DIAGNOSIS — E43 Unspecified severe protein-calorie malnutrition: Secondary | ICD-10-CM | POA: Diagnosis not present

## 2016-12-28 DIAGNOSIS — J45909 Unspecified asthma, uncomplicated: Secondary | ICD-10-CM | POA: Diagnosis not present

## 2016-12-28 DIAGNOSIS — Z471 Aftercare following joint replacement surgery: Secondary | ICD-10-CM | POA: Diagnosis not present

## 2016-12-28 DIAGNOSIS — I129 Hypertensive chronic kidney disease with stage 1 through stage 4 chronic kidney disease, or unspecified chronic kidney disease: Secondary | ICD-10-CM | POA: Diagnosis not present

## 2016-12-28 DIAGNOSIS — N390 Urinary tract infection, site not specified: Secondary | ICD-10-CM | POA: Diagnosis not present

## 2016-12-28 DIAGNOSIS — E1122 Type 2 diabetes mellitus with diabetic chronic kidney disease: Secondary | ICD-10-CM | POA: Diagnosis not present

## 2016-12-28 DIAGNOSIS — Z87440 Personal history of urinary (tract) infections: Secondary | ICD-10-CM | POA: Diagnosis not present

## 2016-12-28 DIAGNOSIS — F329 Major depressive disorder, single episode, unspecified: Secondary | ICD-10-CM | POA: Diagnosis not present

## 2016-12-28 DIAGNOSIS — I4891 Unspecified atrial fibrillation: Secondary | ICD-10-CM | POA: Diagnosis not present

## 2016-12-28 DIAGNOSIS — Z96651 Presence of right artificial knee joint: Secondary | ICD-10-CM | POA: Diagnosis not present

## 2016-12-28 DIAGNOSIS — N189 Chronic kidney disease, unspecified: Secondary | ICD-10-CM | POA: Diagnosis not present

## 2016-12-28 NOTE — Patient Outreach (Signed)
Transition of care call: Discharge date of 12/27/2016  Placed call to patient for initial contact. No answer. No machine.  PLAN: will attempt again on next business day.  Tomasa Rand, RN, BSN, CEN [facility] [employer] [phone]

## 2016-12-30 DIAGNOSIS — Z96651 Presence of right artificial knee joint: Secondary | ICD-10-CM | POA: Diagnosis not present

## 2016-12-30 DIAGNOSIS — I129 Hypertensive chronic kidney disease with stage 1 through stage 4 chronic kidney disease, or unspecified chronic kidney disease: Secondary | ICD-10-CM | POA: Diagnosis not present

## 2016-12-30 DIAGNOSIS — Z471 Aftercare following joint replacement surgery: Secondary | ICD-10-CM | POA: Diagnosis not present

## 2016-12-30 DIAGNOSIS — E1122 Type 2 diabetes mellitus with diabetic chronic kidney disease: Secondary | ICD-10-CM | POA: Diagnosis not present

## 2016-12-30 DIAGNOSIS — N189 Chronic kidney disease, unspecified: Secondary | ICD-10-CM | POA: Diagnosis not present

## 2016-12-30 DIAGNOSIS — M069 Rheumatoid arthritis, unspecified: Secondary | ICD-10-CM | POA: Diagnosis not present

## 2016-12-31 ENCOUNTER — Encounter (INDEPENDENT_AMBULATORY_CARE_PROVIDER_SITE_OTHER): Payer: Self-pay | Admitting: Specialist

## 2016-12-31 ENCOUNTER — Ambulatory Visit (INDEPENDENT_AMBULATORY_CARE_PROVIDER_SITE_OTHER): Payer: Medicare Other | Admitting: Specialist

## 2016-12-31 ENCOUNTER — Ambulatory Visit (INDEPENDENT_AMBULATORY_CARE_PROVIDER_SITE_OTHER): Payer: Medicare Other

## 2016-12-31 ENCOUNTER — Other Ambulatory Visit: Payer: Self-pay

## 2016-12-31 VITALS — BP 116/59 | HR 50 | Ht 60.0 in | Wt 160.0 lb

## 2016-12-31 DIAGNOSIS — Z96651 Presence of right artificial knee joint: Secondary | ICD-10-CM

## 2016-12-31 DIAGNOSIS — E1122 Type 2 diabetes mellitus with diabetic chronic kidney disease: Secondary | ICD-10-CM | POA: Diagnosis not present

## 2016-12-31 DIAGNOSIS — M069 Rheumatoid arthritis, unspecified: Secondary | ICD-10-CM | POA: Diagnosis not present

## 2016-12-31 DIAGNOSIS — N189 Chronic kidney disease, unspecified: Secondary | ICD-10-CM | POA: Diagnosis not present

## 2016-12-31 DIAGNOSIS — I129 Hypertensive chronic kidney disease with stage 1 through stage 4 chronic kidney disease, or unspecified chronic kidney disease: Secondary | ICD-10-CM | POA: Diagnosis not present

## 2016-12-31 DIAGNOSIS — Z471 Aftercare following joint replacement surgery: Secondary | ICD-10-CM | POA: Diagnosis not present

## 2016-12-31 MED ORDER — HYDROCODONE-ACETAMINOPHEN 5-325 MG PO TABS: 1.0000 | ORAL_TABLET | ORAL | 0 refills | Status: DC | PRN

## 2016-12-31 NOTE — Patient Instructions (Signed)
Keep knee incision dry for 5 days post op then may wet while bathing. Therapy daily and CPM goal full extension and greater than 90 degrees flexion. Call if fever or chills or increased drainage. Go to ER if acutely short of breath or call for ambulance. Return for follow up in 2 weeks. May full weight bear on the surgical leg unless told otherwise. Use knee immobilizer until able to straight leg raise off bed with knee stable. In house walking for first 2 weeks.  May bathe the incision with shower.  INSTRUCTIONS AFTER JOINT REPLACEMENT   o Remove items at home which could result in a fall. This includes throw rugs or furniture in walking pathways o ICE to the affected joint every three hours while awake for 30 minutes at a time, for at least the first 3-5 days, and then as needed for pain and swelling.  Continue to use ice for pain and swelling. You may notice swelling that will progress down to the foot and ankle.  This is normal after surgery.  Elevate your leg when you are not up walking on it.   o Continue to use the breathing machine you got in the hospital (incentive spirometer) which will help keep your temperature down.  It is common for your temperature to cycle up and down following surgery, especially at night when you are not up moving around and exerting yourself.  The breathing machine keeps your lungs expanded and your temperature down.   DIET:  As you were doing prior to hospitalization, we recommend a well-balanced diet.  DRESSING / WOUND CARE / SHOWERING  You may shower 3 days after surgery, but keep the wounds dry during showering.  You may use an occlusive plastic wrap (Press'n Seal for example), NO SOAKING/SUBMERGING IN THE BATHTUB.  If the bandage gets wet, change with a clean dry gauze.  If the incision gets wet, pat the wound dry with a clean towel.  ACTIVITY  o Increase activity slowly as tolerated, but follow the weight bearing instructions below.   o No  driving for 6 weeks or until further direction given by your physician.  You cannot drive while taking narcotics.  o No lifting or carrying greater than 10 lbs. until further directed by your surgeon. o Avoid periods of inactivity such as sitting longer than an hour when not asleep. This helps prevent blood clots.  o You may return to work once you are authorized by your doctor.     WEIGHT BEARING   Weight bearing as tolerated with assist device (walker, cane, etc) as directed, use it as long as suggested by your surgeon or therapist, typically at least 4-6 weeks.   EXERCISES  Results after joint replacement surgery are often greatly improved when you follow the exercise, range of motion and muscle strengthening exercises prescribed by your doctor. Safety measures are also important to protect the joint from further injury. Any time any of these exercises cause you to have increased pain or swelling, decrease what you are doing until you are comfortable again and then slowly increase them. If you have problems or questions, call your caregiver or physical therapist for advice.   Rehabilitation is important following a joint replacement. After just a few days of immobilization, the muscles of the leg can become weakened and shrink (atrophy).  These exercises are designed to build up the tone and strength of the thigh and leg muscles and to improve motion. Often times heat used  for twenty to thirty minutes before working out will loosen up your tissues and help with improving the range of motion but do not use heat for the first two weeks following surgery (sometimes heat can increase post-operative swelling).   These exercises can be done on a training (exercise) mat, on the floor, on a table or on a bed. Use whatever works the best and is most comfortable for you.    Use music or television while you are exercising so that the exercises are a pleasant break in your day. This will make your life  better with the exercises acting as a break in your routine that you can look forward to.   Perform all exercises about fifteen times, three times per day or as directed.  You should exercise both the operative leg and the other leg as well.  Exercises include:   . Quad Sets - Tighten up the muscle on the front of the thigh (Quad) and hold for 5-10 seconds.   . Straight Leg Raises - With your knee straight (if you were given a brace, keep it on), lift the leg to 60 degrees, hold for 3 seconds, and slowly lower the leg.  Perform this exercise against resistance later as your leg gets stronger.  . Leg Slides: Lying on your back, slowly slide your foot toward your buttocks, bending your knee up off the floor (only go as far as is comfortable). Then slowly slide your foot back down until your leg is flat on the floor again.  [place] Haring Wings: Lying on your back spread your legs to the side as far apart as you can without causing discomfort.  . Hamstring Strength:  Lying on your back, push your heel against the floor with your leg straight by tightening up the muscles of your buttocks.  Repeat, but this time bend your knee to a comfortable angle, and push your heel against the floor.  You may put a pillow under the heel to make it more comfortable if necessary.   A rehabilitation program following joint replacement surgery can speed recovery and prevent re-injury in the future due to weakened muscles. Contact your doctor or a physical therapist for more information on knee rehabilitation.    CONSTIPATION  Constipation is defined medically as fewer than three stools per week and severe constipation as less than one stool per week.  Even if you have a regular bowel pattern at home, your normal regimen is likely to be disrupted due to multiple reasons following surgery.  Combination of anesthesia, postoperative narcotics, change in appetite and fluid intake all can affect your bowels.   YOU MUST use at least  one of the following options; they are listed in order of increasing strength to get the job done.  They are all available over the counter, and you may need to use some, POSSIBLY even all of these options:    Drink plenty of fluids (prune juice may be helpful) and high fiber foods Colace 100 mg by mouth twice a day  Senokot for constipation as directed and as needed Dulcolax (bisacodyl), take with full glass of water  Miralax (polyethylene glycol) once or twice a day as needed.  If you have tried all these things and are unable to have a bowel movement in the first 3-4 days after surgery call either your surgeon or your primary doctor.    If you experience loose stools or diarrhea, hold the medications until you stool forms back up.  If your symptoms do not get better within 1 week or if they get worse, check with your doctor.  If you experience "the worst abdominal pain ever" or develop nausea or vomiting, please contact the office immediately for further recommendations for treatment.   ITCHING:  If you experience itching with your medications, try taking only a single pain pill, or even half a pain pill at a time.  You can also use Benadryl over the counter for itching or also to help with sleep.   TED HOSE STOCKINGS:  Use stockings on both legs until for at least 2 weeks or as directed by physician office. They may be removed at night for sleeping.  MEDICATIONS:  See your medication summary on the "After Visit Summary" that nursing will review with you.  You may have some home medications which will be placed on hold until you complete the course of blood thinner medication.  It is important for you to complete the blood thinner medication as prescribed.  PRECAUTIONS:  If you experience chest pain or shortness of breath - call 911 immediately for transfer to the hospital emergency department.   If you develop a fever greater that 101 F, purulent drainage from wound, increased redness or  drainage from wound, foul odor from the wound/dressing, or calf pain - CONTACT YOUR SURGEON.                                                   FOLLOW-UP APPOINTMENTS:  If you do not already have a post-op appointment, please call the office for an appointment to be seen by your surgeon.  Guidelines for how soon to be seen are listed in your "After Visit Summary", but are typically between 1-4 weeks after surgery.  OTHER INSTRUCTIONS:   Knee Replacement:  Do not place pillow under knee, focus on keeping the knee straight while resting. CPM instructions: 0-90 degrees, 2 hours in the morning, 2 hours in the afternoon, and 2 hours in the evening. Place foam block, curve side up under heel at all times except when in CPM or when walking.  DO NOT modify, tear, cut, or change the foam block in any way.  MAKE SURE YOU:  . Understand these instructions.  . Get help right away if you are not doing well or get worse.    Thank you for letting us be a part of your medical care team.  It is a privilege we respect greatly.  We hope these instructions will help you stay on track for a fast and full recovery!

## 2016-12-31 NOTE — Progress Notes (Signed)
Post-Op Visit Note   Patient: Gloria Best           Date of Birth: 06-13-1941           MRN: [id number] Visit Date: 12/31/2016 PCP: Marco Collie, MD   Assessment & Plan:Now 2 1/2 weeks post right total knee replacement, doing well.  Chief Complaint:  Gloria Best is here 3 weeks out from right total Knee Arthroplasty. She states that she is doing well.  She started with home health physical therapy today. Steri-strips were removed. Incision looks to be healing well. Visit Diagnoses:  1. Status post total right knee replacement    Right knee with out erythrema or drainage, minimal swelling. ROM 0-110.  Plan:    Keep knee incision dry for 5 days post op then may wet while bathing. Therapy daily and CPM goal full extension and greater than 90 degrees flexion. Call if fever or chills or increased drainage. Go to ER if acutely short of breath or call for ambulance. Return for follow up in 2 weeks. May full weight bear on the surgical leg unless told otherwise. Use knee immobilizer until able to straight leg raise off bed with knee stable. In house walking for first 2 weeks.  May bathe the incision with shower.  INSTRUCTIONS AFTER JOINT REPLACEMENT   o Remove items at home which could result in a fall. This includes throw rugs or furniture in walking pathways o ICE to the affected joint every three hours while awake for 30 minutes at a time, for at least the first 3-5 days, and then as needed for pain and swelling.  Continue to use ice for pain and swelling. You may notice swelling that will progress down to the foot and ankle.  This is normal after surgery.  Elevate your leg when you are not up walking on it.   o Continue to use the breathing machine you got in the hospital (incentive spirometer) which will help keep your temperature down.  It is common for your temperature to cycle up and down following surgery, especially at night when you are not up moving around and exerting  yourself.  The breathing machine keeps your lungs expanded and your temperature down.   DIET:  As you were doing prior to hospitalization, we recommend a well-balanced diet.  DRESSING / WOUND CARE / SHOWERING  You may shower 3 days after surgery, but keep the wounds dry during showering.  You may use an occlusive plastic wrap (Press'n Seal for example), NO SOAKING/SUBMERGING IN THE BATHTUB.  If the bandage gets wet, change with a clean dry gauze.  If the incision gets wet, pat the wound dry with a clean towel.  ACTIVITY  o Increase activity slowly as tolerated, but follow the weight bearing instructions below.   o No driving for 6 weeks or until further direction given by your physician.  You cannot drive while taking narcotics.  o No lifting or carrying greater than 10 lbs. until further directed by your surgeon. o Avoid periods of inactivity such as sitting longer than an hour when not asleep. This helps prevent blood clots.  o You may return to work once you are authorized by your doctor.     WEIGHT BEARING   Weight bearing as tolerated with assist device (walker, cane, etc) as directed, use it as long as suggested by your surgeon or therapist, typically at least 4-6 weeks.   EXERCISES  Results after joint replacement surgery are often greatly  improved when you follow the exercise, range of motion and muscle strengthening exercises prescribed by your doctor. Safety measures are also important to protect the joint from further injury. Any time any of these exercises cause you to have increased pain or swelling, decrease what you are doing until you are comfortable again and then slowly increase them. If you have problems or questions, call your caregiver or physical therapist for advice.   Rehabilitation is important following a joint replacement. After just a few days of immobilization, the muscles of the leg can become weakened and shrink (atrophy).  These exercises are designed to  build up the tone and strength of the thigh and leg muscles and to improve motion. Often times heat used for twenty to thirty minutes before working out will loosen up your tissues and help with improving the range of motion but do not use heat for the first two weeks following surgery (sometimes heat can increase post-operative swelling).   These exercises can be done on a training (exercise) mat, on the floor, on a table or on a bed. Use whatever works the best and is most comfortable for you.    Use music or television while you are exercising so that the exercises are a pleasant break in your day. This will make your life better with the exercises acting as a break in your routine that you can look forward to.   Perform all exercises about fifteen times, three times per day or as directed.  You should exercise both the operative leg and the other leg as well.  Exercises include:   . Quad Sets - Tighten up the muscle on the front of the thigh (Quad) and hold for 5-10 seconds.   . Straight Leg Raises - With your knee straight (if you were given a brace, keep it on), lift the leg to 60 degrees, hold for 3 seconds, and slowly lower the leg.  Perform this exercise against resistance later as your leg gets stronger.  . Leg Slides: Lying on your back, slowly slide your foot toward your buttocks, bending your knee up off the floor (only go as far as is comfortable). Then slowly slide your foot back down until your leg is flat on the floor again.  Glenard Haring Wings: Lying on your back spread your legs to the side as far apart as you can without causing discomfort.  . Hamstring Strength:  Lying on your back, push your heel against the floor with your leg straight by tightening up the muscles of your buttocks.  Repeat, but this time bend your knee to a comfortable angle, and push your heel against the floor.  You may put a pillow under the heel to make it more comfortable if necessary.   A rehabilitation program  following joint replacement surgery can speed recovery and prevent re-injury in the future due to weakened muscles. Contact your doctor or a physical therapist for more information on knee rehabilitation.    CONSTIPATION  Constipation is defined medically as fewer than three stools per week and severe constipation as less than one stool per week.  Even if you have a regular bowel pattern at home, your normal regimen is likely to be disrupted due to multiple reasons following surgery.  Combination of anesthesia, postoperative narcotics, change in appetite and fluid intake all can affect your bowels.   YOU MUST use at least one of the following options; they are listed in order of increasing strength to get the  job done.  They are all available over the counter, and you may need to use some, POSSIBLY even all of these options:    Drink plenty of fluids (prune juice may be helpful) and high fiber foods Colace 100 mg by mouth twice a day  Senokot for constipation as directed and as needed Dulcolax (bisacodyl), take with full glass of water  Miralax (polyethylene glycol) once or twice a day as needed.  If you have tried all these things and are unable to have a bowel movement in the first 3-4 days after surgery call either your surgeon or your primary doctor.    If you experience loose stools or diarrhea, hold the medications until you stool forms back up.  If your symptoms do not get better within 1 week or if they get worse, check with your doctor.  If you experience "the worst abdominal pain ever" or develop nausea or vomiting, please contact the office immediately for further recommendations for treatment.   ITCHING:  If you experience itching with your medications, try taking only a single pain pill, or even half a pain pill at a time.  You can also use Benadryl over the counter for itching or also to help with sleep.   TED HOSE STOCKINGS:  Use stockings on both legs until for at least 2 weeks  or as directed by physician office. They may be removed at night for sleeping.  MEDICATIONS:  See your medication summary on the "After Visit Summary" that nursing will review with you.  You may have some home medications which will be placed on hold until you complete the course of blood thinner medication.  It is important for you to complete the blood thinner medication as prescribed.  PRECAUTIONS:  If you experience chest pain or shortness of breath - call 911 immediately for transfer to the hospital emergency department.   If you develop a fever greater that 101 F, purulent drainage from wound, increased redness or drainage from wound, foul odor from the wound/dressing, or calf pain - CONTACT YOUR SURGEON.                                                   FOLLOW-UP APPOINTMENTS:  If you do not already have a post-op appointment, please call the office for an appointment to be seen by your surgeon.  Guidelines for how soon to be seen are listed in your "After Visit Summary", but are typically between 1-4 weeks after surgery.  OTHER INSTRUCTIONS:   Knee Replacement:  Do not place pillow under knee, focus on keeping the knee straight while resting. CPM instructions: 0-90 degrees, 2 hours in the morning, 2 hours in the afternoon, and 2 hours in the evening. Place foam block, curve side up under heel at all times except when in CPM or when walking.  DO NOT modify, tear, cut, or change the foam block in any way.  MAKE SURE YOU:  . Understand these instructions.  . Get help right away if you are not doing well or get worse.    Thank you for letting us be a part of your medical care team.  It is a privilege we respect greatly.  We hope these instructions will help you stay on track for a fast and full recovery!    Follow-Up Instructions: No Follow-up on  file.   Orders:  Orders Placed This Encounter  Procedures  . XR Knee 1-2 Views Right   No orders of the defined types were placed in this  encounter.   Imaging: No results found.  PMFS History: Patient Active Problem List   Diagnosis Date Noted  . Protein-calorie malnutrition, severe 12/26/2016  . Constipated   . Altered mental status   . Acute renal failure (ARF) ([place])   . Hematemesis with nausea   . Elevated WBC count 12/21/2016  . At risk for adverse drug event 12/14/2016  . Type 2 diabetes mellitus with polyneuropathy ([place]) 12/13/2016    Class: Chronic  . Postoperative urinary retention 12/13/2016    Class: Acute  . Unilateral primary osteoarthritis, right knee 12/10/2016    Class: Chronic  . Tricompartment osteoarthritis of right knee 12/10/2016  . Postoperative anemia due to acute blood loss 06/12/2012    Class: Acute  . AVN (avascular necrosis of bone) ([place]) 06/09/2012    Class: Chronic  . Osteoarthritis of right hip 06/09/2012    Class: Chronic   Past Medical History:  Diagnosis Date  . Anemia    hx of blood transfusion  . Anxiety   . Arthritis    RA  . Asthma    hx of as child  . Bladder disorder    having difficulty voiding, requires in and out catheter 2x day and prn; denied current need for I&O cath 08/27/16  . Bronchitis    hx of  . Carpal tunnel syndrome of left wrist    hx of  . Chronic back pain   . Chronic kidney disease   . Depression    takes medication for   . Diabetes mellitus    oral medication  . Dysrhythmia    takes cardizem, sees Dr. Marco Collie primary  . Fibromyalgia    takes lyrica  . GERD (gastroesophageal reflux disease)   . H/O hiatal hernia    hx of   . Heart murmur   . Hypertension   . Pneumonia    hx of  . Sleep apnea    no CPAP. last sleep study over 10 years ago  . Urinary tract infection    hx of  . Wears dentures    top    Family History  Problem Relation Age of Onset  . Diabetes Mother   . Diabetes Father   . Diabetes Sister   . Diabetes Brother   . Heart disease Neg Hx   . Stroke Neg Hx   . Cancer Neg Hx     Past Surgical History:    Procedure Laterality Date  . ABDOMINAL HYSTERECTOMY    . APPENDECTOMY    . BACK SURGERY      x 3  . CARPAL TUNNEL RELEASE     left side only  . CATARACT EXTRACTION     bilateral  . CESAREAN SECTION    . COLON SURGERY     "due to large polyp"  . COLONOSCOPY    . ESOPHAGOGASTRODUODENOSCOPY (EGD) WITH PROPOFOL N/A 12/26/2016   Procedure: ESOPHAGOGASTRODUODENOSCOPY (EGD) WITH PROPOFOL;  Surgeon: Doran Stabler, MD;  Location: [facility];  Service: Endoscopy;  Laterality: N/A;  . FOOT SURGERY    . HERNIA REPAIR    . TENOLYSIS Left 11/12/2013   Procedure: LEFT TENDON GRAFT RECONSTRUCTION OF LEFT LONG FINGER SAGITTAL FIBERS CORD RELEASE;  Surgeon: Cammie Sickle., MD;  Location: [facility];  Service: Orthopedics;  Laterality:  Left;  . TOTAL HIP ARTHROPLASTY  06/09/2012   Procedure: TOTAL HIP ARTHROPLASTY;  Surgeon: [name], MD;  Location: [facility];  Service: Orthopedics;  Laterality: Right;  Right total hip replacement  . TOTAL KNEE ARTHROPLASTY Right 12/10/2016   Procedure: RIGHT TOTAL KNEE ARTHROPLASTY;  Surgeon: [name], MD;  Location: [facility];  Service: Orthopedics;  Laterality: Right;   Social History   Occupational History  . Not on file.   Social History Main Topics  . Smoking status: Former Smoker    Quit date: 11/05/1984  . Smokeless tobacco: Never Used     Comment: stopped smoking 1983  . Alcohol use No  . Drug use: No  . Sexual activity: No

## 2016-12-31 NOTE — Patient Outreach (Signed)
Transition of care: 2nd attempt to reach patient-successful. Placed call to patient to follow up from discharge on 12/27/2016.  Patient reports that she is doing well. States that she is taking her medications as prescribed. States that her grandson and daughter are assisting as needed.   Patient reports that she has completed her antibiotics. Reports [facility] physical therapist is with her now and she just walked.  Reviewed pending appointments. Patient has follow up planned with Dr. Louanne Skye today [facility] follow up with Dr. Nyra Capes on 01/03/2017.  Patient reports no concerns today.   Outpatient Encounter Prescriptions as of 12/31/2016  Medication Sig  . acetaminophen (TYLENOL) 500 MG tablet Take 500 mg by mouth every 8 (eight) hours as needed (for pain).   . ARIPiprazole (ABILIFY) 5 MG tablet Take 5 mg by mouth every evening.   Marland Kitchen atorvastatin (LIPITOR) 80 MG tablet Take 80 mg by mouth daily at 6 PM.   . cloNIDine (CATAPRES) 0.2 MG tablet Take 0.2 mg by mouth 2 (two) times daily.  . COMBIVENT RESPIMAT 20-100 MCG/ACT AERS respimat Inhale 1 puff into the lungs every 6 (six) hours as needed for wheezing or shortness of breath. For shortness of breath.  . dabigatran (PRADAXA) 75 MG CAPS capsule Take 1 capsule (75 mg total) by mouth every 12 (twelve) hours.  Marland Kitchen diltiazem (CARDIZEM CD) 240 MG 24 hr capsule Take 240 mg by mouth daily.  Marland Kitchen escitalopram (LEXAPRO) 10 MG tablet Take 10 mg by mouth daily.  . ferrous sulfate 325 (65 FE) MG tablet Take 1 tablet (325 mg total) by mouth 2 (two) times daily with a meal.  . folic acid (FOLVITE) 1 MG tablet Take 1 mg by mouth every evening.   . furosemide (LASIX) 40 MG tablet Take 40 mg by mouth daily.  Marland Kitchen gabapentin (NEURONTIN) 100 MG capsule Take 100 mg by mouth 2 (two) times daily.  . hydroxychloroquine (PLAQUENIL) 200 MG tablet Take 200 mg by mouth 2 (two) times daily.   . hydroxypropyl methylcellulose / hypromellose (ISOPTO TEARS / GONIOVISC) 2.5 %  ophthalmic solution Place 1-2 drops into both eyes 3 (three) times daily as needed for dry eyes.  . pantoprazole (PROTONIX) 40 MG tablet Take 1 tablet (40 mg total) by mouth daily before breakfast.  . polyethylene glycol (MIRALAX / GLYCOLAX) packet Take 17 g by mouth daily.  . potassium chloride SA (K-DUR,KLOR-CON) 20 MEQ tablet Take 20 mEq by mouth daily.  . pregabalin (LYRICA) 50 MG capsule Take 50 mg by mouth 2 (two) times daily.  . tamsulosin (FLOMAX) 0.4 MG CAPS capsule Take 1 capsule (0.4 mg total) by mouth daily after breakfast.  . Vitamin D, Ergocalciferol, (DRISDOL) 50000 units CAPS capsule Take 50,000 Units by mouth every 30 (thirty) days.  . diclofenac sodium (VOLTAREN) 1 % GEL Apply 2 g topically 4 (four) times daily as needed. For pain.  Marland Kitchen HYDROcodone-acetaminophen (NORCO/VICODIN) 5-325 MG tablet Take 1 tablet by mouth every 6 (six) hours as needed for moderate pain.  . pramipexole (MIRAPEX) 0.5 MG tablet Take 0.5 mg by mouth at bedtime.   No facility-administered encounter medications on file as of 12/31/2016.     Plan: Will contact patient weekly for transition of care. Provided my contact information. Will send this note and barrier letter to MD.  [facility] CM Care Plan Problem One   Flowsheet Row Most Recent Value  Care Plan Problem One  Recent admission for UTI.  Role Documenting the Problem One  Care Management Coordinator  Care Plan for Problem One  Active  [facility] Long Term Goal (31-90 days)  Patient will report no readmission in the next 60 days.  [facility] Long Term Goal Start Date  12/31/16  Interventions for Problem One Long Term Goal  Reviewed discharge instructions, follow up and medications. Reviewed [facility] program.   [facility] CM Short Term Goal #1 (0-30 days)  Patient will report no falls in the next 30 days.  [facility] CM Short Term Goal #1 Start Date  12/31/16  Interventions for Short Term Goal #1  reviewed importance of PT and fall precautions.   [facility] CM Short Term Goal #2 (0-30 days)   Patient will report follow up with primary Md in the next 10 days.  [facility] CM Short Term Goal #2 Start Date  12/31/16  Interventions for Short Term Goal #2  Reviewed importance of follow up with Md.  Confirmed pending appointment.      Tomasa Rand, RN, BSN, CEN [facility] [employer] [phone]

## 2017-01-03 DIAGNOSIS — D500 Iron deficiency anemia secondary to blood loss (chronic): Secondary | ICD-10-CM | POA: Diagnosis not present

## 2017-01-03 DIAGNOSIS — M069 Rheumatoid arthritis, unspecified: Secondary | ICD-10-CM | POA: Diagnosis not present

## 2017-01-03 DIAGNOSIS — N390 Urinary tract infection, site not specified: Secondary | ICD-10-CM | POA: Diagnosis not present

## 2017-01-03 DIAGNOSIS — Z471 Aftercare following joint replacement surgery: Secondary | ICD-10-CM | POA: Diagnosis not present

## 2017-01-03 DIAGNOSIS — K922 Gastrointestinal hemorrhage, unspecified: Secondary | ICD-10-CM | POA: Diagnosis not present

## 2017-01-03 DIAGNOSIS — Z6831 Body mass index (BMI) 31.0-31.9, adult: Secondary | ICD-10-CM | POA: Diagnosis not present

## 2017-01-03 DIAGNOSIS — N189 Chronic kidney disease, unspecified: Secondary | ICD-10-CM | POA: Diagnosis not present

## 2017-01-03 DIAGNOSIS — I129 Hypertensive chronic kidney disease with stage 1 through stage 4 chronic kidney disease, or unspecified chronic kidney disease: Secondary | ICD-10-CM | POA: Diagnosis not present

## 2017-01-03 DIAGNOSIS — Z96651 Presence of right artificial knee joint: Secondary | ICD-10-CM | POA: Diagnosis not present

## 2017-01-03 DIAGNOSIS — E1122 Type 2 diabetes mellitus with diabetic chronic kidney disease: Secondary | ICD-10-CM | POA: Diagnosis not present

## 2017-01-04 DIAGNOSIS — Z96651 Presence of right artificial knee joint: Secondary | ICD-10-CM | POA: Diagnosis not present

## 2017-01-04 DIAGNOSIS — N189 Chronic kidney disease, unspecified: Secondary | ICD-10-CM | POA: Diagnosis not present

## 2017-01-04 DIAGNOSIS — E1122 Type 2 diabetes mellitus with diabetic chronic kidney disease: Secondary | ICD-10-CM | POA: Diagnosis not present

## 2017-01-04 DIAGNOSIS — I129 Hypertensive chronic kidney disease with stage 1 through stage 4 chronic kidney disease, or unspecified chronic kidney disease: Secondary | ICD-10-CM | POA: Diagnosis not present

## 2017-01-04 DIAGNOSIS — M069 Rheumatoid arthritis, unspecified: Secondary | ICD-10-CM | POA: Diagnosis not present

## 2017-01-04 DIAGNOSIS — Z471 Aftercare following joint replacement surgery: Secondary | ICD-10-CM | POA: Diagnosis not present

## 2017-01-07 ENCOUNTER — Ambulatory Visit: Payer: Medicare Other

## 2017-01-07 ENCOUNTER — Other Ambulatory Visit: Payer: Self-pay

## 2017-01-07 DIAGNOSIS — Z471 Aftercare following joint replacement surgery: Secondary | ICD-10-CM | POA: Diagnosis not present

## 2017-01-07 DIAGNOSIS — N189 Chronic kidney disease, unspecified: Secondary | ICD-10-CM | POA: Diagnosis not present

## 2017-01-07 DIAGNOSIS — M069 Rheumatoid arthritis, unspecified: Secondary | ICD-10-CM | POA: Diagnosis not present

## 2017-01-07 DIAGNOSIS — Z96651 Presence of right artificial knee joint: Secondary | ICD-10-CM | POA: Diagnosis not present

## 2017-01-07 DIAGNOSIS — E1122 Type 2 diabetes mellitus with diabetic chronic kidney disease: Secondary | ICD-10-CM | POA: Diagnosis not present

## 2017-01-07 DIAGNOSIS — I129 Hypertensive chronic kidney disease with stage 1 through stage 4 chronic kidney disease, or unspecified chronic kidney disease: Secondary | ICD-10-CM | POA: Diagnosis not present

## 2017-01-07 NOTE — Patient Outreach (Signed)
Transition of care: Placed call to patient who answered Reports that she is doing well. States problems with her arthritis. States the she will have treatment for arthritis tomorrow. Reports urinary symptoms are gone.  Offered home visit for 01/09/2017 and patient accepted. Confirmed address.  PLAN: will continue transition of care weekly outreaches. Home visit planned for 01/09/2017 at [place], RN, BSN, [place] Coordinator [phone]

## 2017-01-08 DIAGNOSIS — M0589 Other rheumatoid arthritis with rheumatoid factor of multiple sites: Secondary | ICD-10-CM | POA: Diagnosis not present

## 2017-01-08 DIAGNOSIS — Z79899 Other long term (current) drug therapy: Secondary | ICD-10-CM | POA: Diagnosis not present

## 2017-01-09 ENCOUNTER — Other Ambulatory Visit: Payer: Self-pay

## 2017-01-09 DIAGNOSIS — M069 Rheumatoid arthritis, unspecified: Secondary | ICD-10-CM | POA: Diagnosis not present

## 2017-01-09 DIAGNOSIS — E1122 Type 2 diabetes mellitus with diabetic chronic kidney disease: Secondary | ICD-10-CM | POA: Diagnosis not present

## 2017-01-09 DIAGNOSIS — I129 Hypertensive chronic kidney disease with stage 1 through stage 4 chronic kidney disease, or unspecified chronic kidney disease: Secondary | ICD-10-CM | POA: Diagnosis not present

## 2017-01-09 DIAGNOSIS — Z96651 Presence of right artificial knee joint: Secondary | ICD-10-CM | POA: Diagnosis not present

## 2017-01-09 DIAGNOSIS — N189 Chronic kidney disease, unspecified: Secondary | ICD-10-CM | POA: Diagnosis not present

## 2017-01-09 DIAGNOSIS — Z471 Aftercare following joint replacement surgery: Secondary | ICD-10-CM | POA: Diagnosis not present

## 2017-01-09 NOTE — Patient Outreach (Signed)
[facility] [place]) Care Management   01/09/2017  Gloria Best [id number]  Gloria Best is an 76 y.o. female 0900 Arrived for home visit. Occupational therapist with patient.  Subjective: Patient reports that she is doing well since discharged home. Reports she is ambulating well.  States that she developed diarrhea for 2 days after taking daily miralax.  States now she has a sore on her bottom.  States she needs someone to look at it because she can not see it herself.  States that she is sitting on a doughnut ring.   Reports that she does her exercises daily for PT. Reports her grandson is home with her during the day and her daughter is home with her at night.  Patient reports reason for admission was UTI and vomiting. States neither symptoms are an issue at this time. Has completed antibiotics at this time.  Objective:  Ambulates well with walker. Awake and alert.  Vitals:   01/09/17 0923  BP: 122/60  Pulse: 60  Resp: 18  SpO2: 96%  Weight: 161 lb (73 kg)  Height: 1.524 m (5')   Review of Systems  Constitutional: Positive for weight loss.  HENT: Negative.   Eyes: Positive for blurred vision.  Respiratory: Positive for cough.        Reports productive cough with yellow sputum. Reports this cough has been around for 2 months  Cardiovascular: Negative.   Gastrointestinal: Positive for diarrhea.       Diarrhea for 2 days ended on 01/08/2017.  Was taking miralax daily as per discharge instructions. Has not changed and is only taking miralax as needed. Reports normal bowel movement today  Genitourinary: Negative.   Musculoskeletal: Negative.        Denies any joint pain even states no pain from knee replacement  Skin:       Report buttock pain. States that she thinks she has a sore.   Neurological: Positive for weakness.  Endo/Heme/Allergies: Negative.   Psychiatric/Behavioral: Negative.     Physical Exam  Constitutional: She is oriented to person,  place, and time. She appears well-developed and well-nourished.  Cardiovascular: Normal rate, normal heart sounds and intact distal pulses.   Respiratory: Effort normal and breath sounds normal.  GI: Soft. Bowel sounds are normal.  Musculoskeletal: Normal range of motion. She exhibits no edema.  Neurological: She is alert and oriented to person, place, and time.  Skin: Skin is warm and dry.  Feet without open lesions.  Left buttock with dime size open area.   Psychiatric: She has a normal mood and affect. Her behavior is normal. Judgment and thought content normal.    Encounter Medications:   Outpatient Encounter Prescriptions as of 01/09/2017  Medication Sig Note  . ARIPiprazole (ABILIFY) 5 MG tablet Take 5 mg by mouth every evening.    Marland Kitchen atorvastatin (LIPITOR) 80 MG tablet Take 80 mg by mouth daily at 6 PM.    . cloNIDine (CATAPRES) 0.2 MG tablet Take 0.2 mg by mouth 2 (two) times daily.   . COMBIVENT RESPIMAT 20-100 MCG/ACT AERS respimat Inhale 1 puff into the lungs every 6 (six) hours as needed for wheezing or shortness of breath. For shortness of breath.   . dabigatran (PRADAXA) 75 MG CAPS capsule Take 1 capsule (75 mg total) by mouth every 12 (twelve) hours.   . diclofenac sodium (VOLTAREN) 1 % GEL Apply 2 g topically 4 (four) times daily as needed. For pain.   Marland Kitchen diltiazem (CARDIZEM CD)  240 MG 24 hr capsule Take 240 mg by mouth daily. 01/09/2017: Has been taking 360 mg. Bottle expired on 11/2015.  Patient states that she will call cardiology today.  . escitalopram (LEXAPRO) 10 MG tablet Take 10 mg by mouth daily.   . ferrous sulfate 325 (65 FE) MG tablet Take 1 tablet (325 mg total) by mouth 2 (two) times daily with a meal.   . folic acid (FOLVITE) 1 MG tablet Take 1 mg by mouth every evening.    . furosemide (LASIX) 40 MG tablet Take 40 mg by mouth daily.   . hydroxychloroquine (PLAQUENIL) 200 MG tablet Take 200 mg by mouth 2 (two) times daily.    . hydroxypropyl methylcellulose /  hypromellose (ISOPTO TEARS / GONIOVISC) 2.5 % ophthalmic solution Place 1-2 drops into both eyes 3 (three) times daily as needed for dry eyes.   . pantoprazole (PROTONIX) 40 MG tablet Take 1 tablet (40 mg total) by mouth daily before breakfast.   . polyethylene glycol (MIRALAX / GLYCOLAX) packet Take 17 g by mouth daily. 01/09/2017: Taking as needed only  . potassium chloride SA (K-DUR,KLOR-CON) 20 MEQ tablet Take 20 mEq by mouth 2 (two) times daily.    . pramipexole (MIRAPEX) 0.5 MG tablet Take 0.5 mg by mouth at bedtime.   . pregabalin (LYRICA) 50 MG capsule Take 50 mg by mouth 2 (two) times daily.   . tamsulosin (FLOMAX) 0.4 MG CAPS capsule Take 1 capsule (0.4 mg total) by mouth daily after breakfast.   . Vitamin D, Ergocalciferol, (DRISDOL) 50000 units CAPS capsule Take 50,000 Units by mouth every 30 (thirty) days.   Marland Kitchen acetaminophen (TYLENOL) 500 MG tablet Take 500 mg by mouth every 8 (eight) hours as needed (for pain).    Marland Kitchen gabapentin (NEURONTIN) 100 MG capsule Take 100 mg by mouth 2 (two) times daily. 01/09/2017: Never got an RX and does not take this medications.    Marland Kitchen HYDROcodone-acetaminophen (NORCO/VICODIN) 5-325 MG tablet Take 1 tablet by mouth every 6 (six) hours as needed for moderate pain.   Marland Kitchen HYDROcodone-acetaminophen (NORCO/VICODIN) 5-325 MG tablet Take 1-2 tablets by mouth every 4 (four) hours as needed for moderate pain. (Patient not taking: Reported on 01/09/2017)    No facility-administered encounter medications on file as of 01/09/2017.     Functional Status:   In your present state of health, do you have any difficulty performing the following activities: 01/09/2017 12/21/2016  Hearing? N N  Vision? N N  Difficulty concentrating or making decisions? N N  Walking or climbing stairs? Y Y  Dressing or bathing? N N  Doing errands, shopping? Tempie Donning  Preparing Food and eating ? N -  Using the Toilet? N -  In the past six months, have you accidently leaked urine? Y -  Do you have problems  with loss of bowel control? Y -  Managing your Medications? N -  Managing your Finances? N -  Housekeeping or managing your Housekeeping? Y -  Some recent data might be hidden    Fall/Depression Screening:    PHQ 2/9 Scores 01/09/2017 10/05/2015  PHQ - 2 Score 0 0   Fall Risk  01/09/2017 12/20/2016 06/29/2016  Falls in the past year? No Yes Yes  Number falls in past yr: - 1 1  Injury with Fall? - Yes Yes  Risk for fall due to : Impaired mobility - -    Assessment:  (1) reviewed [facility] program.  Patient has new patient packet from hospital. Provided  new [facility] calendar and my contact card. Reviewed transition of care program with weekly outreaches.  Denies any changes needed to consent form.   (2) reports DM under good control. Last A1c on 12/10/2016 was 6.0.  Patient reports diet controlled. CBG monitoring weekly. (3) new sore area to buttocks.  (4) taking wrong dose of Cardizem. Expired med for greater than 1 year.  (5) no advanced directives.   Plan:  (1) Consent form in EMR. Next outreach planned for 7 days via phone. (2) Encouraged patient to continue to be mindful of carbs.  (3) Inspected area. Dime size open area ( stage 1-2) to left buttock. With patients consent I took picture with [place] issued smart  phone of wound to show to patient.  Placed call to MD office. Spoke with Lelan Pons Teacher, adult education) who states to send picture to Dr. Marco Collie.  I asked permission from patient and she agreed. Verbal consent heard by myself and Lelan Pons via phone. Patient informed that picture of wound would be sent to primary Md ( and [facility] medical directors) personal cell phone.  Patient agreed for send picture. No personal health information included.   Lelan Pons from MD office called MD and informed MD of picture coming and to call me at patients home.  Dr. Nyra Capes called me back after receiving picture and made recommendations for desitin with zinc over the counter. Also recommended to keep area clean and dry and  reports any changes in condition of wound. Patient voiced understanding.  Image deleted from work smart phone and requested image to be deleted from MD phone as well. (4) Informed primary MD of wrong dose of medications and patient will discuss this medications during her cardiology appointment tomorrow.  Will send med list to MD. (5) provided advanced directive packet and reviewed with patient. Encouraged patient to complete.  Goal setting and care planning during home visit and primary goal is avoid readmission. This note sent to MD.   [facility] CM Care Plan Problem One   Flowsheet Row Most Recent Value  Care Plan Problem One  Recent admission for UTI.  Role Documenting the Problem One  Care Management [place] for Problem One  Active  [facility] Long Term Goal (31-90 days)  Patient will report no readmission in the next 60 days.  [facility] Long Term Goal Start Date  12/31/16  Interventions for Problem One Long Term Goal  Home visit completed. Reviewed importance of calling MD for changes in condition.  [facility] CM Short Term Goal #1 (0-30 days)  Patient will report no falls in the next 30 days.  [facility] CM Short Term Goal #1 Start Date  12/31/16  Interventions for Short Term Goal #1  Encouraged patient to continue to do home exercises and use walker.  [facility] CM Short Term Goal #2 (0-30 days)  Patient will report follow up with primary Md in the next 10 days.  [facility] CM Short Term Goal #2 Start Date  12/31/16  [facility] CM Short Term Goal #2 Met Date  01/09/17  Interventions for Short Term Goal #2  Reviewed importance of follow up with Md.  Confirmed pending appointment.    [facility] CM Care Plan Problem Two   Flowsheet Row Most Recent Value  Care Plan Problem Two  Alteration in skin intergity related to diarrhea as evidence by open wound to left buttock.  Role Documenting the Problem Two  Care Management Coordinator  Care Plan for Problem Two  Active  [facility]  CM Short Term Goal #1 (0-30 days)  Patient will report healing  of wound to buttocks in the next 2 weeks.  [facility] CM Short Term Goal #1 Start Date  01/09/17  Interventions for Short Term Goal #2   placed call to MD., sent picture to MD,  Reviewed recommendations with patient. Encouraged patient to have daughter inspect wound several times a week and reports any worsening changes to MD.  Encouraged patient to keep area clean and dry. Avoid fricition and rubbing, sit on doughnut ring, and use barrier cream per MD recommendation.      Tomasa Rand, RN, BSN, CEN [facility] [employer] [phone]

## 2017-01-10 DIAGNOSIS — I10 Essential (primary) hypertension: Secondary | ICD-10-CM | POA: Diagnosis not present

## 2017-01-10 DIAGNOSIS — I480 Paroxysmal atrial fibrillation: Secondary | ICD-10-CM | POA: Diagnosis not present

## 2017-01-10 DIAGNOSIS — E088 Diabetes mellitus due to underlying condition with unspecified complications: Secondary | ICD-10-CM | POA: Diagnosis not present

## 2017-01-10 DIAGNOSIS — E785 Hyperlipidemia, unspecified: Secondary | ICD-10-CM | POA: Diagnosis not present

## 2017-01-11 DIAGNOSIS — N189 Chronic kidney disease, unspecified: Secondary | ICD-10-CM | POA: Diagnosis not present

## 2017-01-11 DIAGNOSIS — M069 Rheumatoid arthritis, unspecified: Secondary | ICD-10-CM | POA: Diagnosis not present

## 2017-01-11 DIAGNOSIS — Z471 Aftercare following joint replacement surgery: Secondary | ICD-10-CM | POA: Diagnosis not present

## 2017-01-11 DIAGNOSIS — I129 Hypertensive chronic kidney disease with stage 1 through stage 4 chronic kidney disease, or unspecified chronic kidney disease: Secondary | ICD-10-CM | POA: Diagnosis not present

## 2017-01-11 DIAGNOSIS — Z96651 Presence of right artificial knee joint: Secondary | ICD-10-CM | POA: Diagnosis not present

## 2017-01-11 DIAGNOSIS — D631 Anemia in chronic kidney disease: Secondary | ICD-10-CM | POA: Diagnosis not present

## 2017-01-11 DIAGNOSIS — E1122 Type 2 diabetes mellitus with diabetic chronic kidney disease: Secondary | ICD-10-CM | POA: Diagnosis not present

## 2017-01-14 DIAGNOSIS — M069 Rheumatoid arthritis, unspecified: Secondary | ICD-10-CM | POA: Diagnosis not present

## 2017-01-14 DIAGNOSIS — I129 Hypertensive chronic kidney disease with stage 1 through stage 4 chronic kidney disease, or unspecified chronic kidney disease: Secondary | ICD-10-CM | POA: Diagnosis not present

## 2017-01-14 DIAGNOSIS — N189 Chronic kidney disease, unspecified: Secondary | ICD-10-CM | POA: Diagnosis not present

## 2017-01-14 DIAGNOSIS — E1122 Type 2 diabetes mellitus with diabetic chronic kidney disease: Secondary | ICD-10-CM | POA: Diagnosis not present

## 2017-01-14 DIAGNOSIS — Z471 Aftercare following joint replacement surgery: Secondary | ICD-10-CM | POA: Diagnosis not present

## 2017-01-14 DIAGNOSIS — Z96651 Presence of right artificial knee joint: Secondary | ICD-10-CM | POA: Diagnosis not present

## 2017-01-16 ENCOUNTER — Other Ambulatory Visit: Payer: Self-pay

## 2017-01-16 DIAGNOSIS — D631 Anemia in chronic kidney disease: Secondary | ICD-10-CM | POA: Diagnosis not present

## 2017-01-16 DIAGNOSIS — N189 Chronic kidney disease, unspecified: Secondary | ICD-10-CM | POA: Diagnosis not present

## 2017-01-16 DIAGNOSIS — D500 Iron deficiency anemia secondary to blood loss (chronic): Secondary | ICD-10-CM | POA: Diagnosis not present

## 2017-01-16 DIAGNOSIS — E538 Deficiency of other specified B group vitamins: Secondary | ICD-10-CM | POA: Diagnosis not present

## 2017-01-16 DIAGNOSIS — M069 Rheumatoid arthritis, unspecified: Secondary | ICD-10-CM | POA: Diagnosis not present

## 2017-01-16 DIAGNOSIS — D638 Anemia in other chronic diseases classified elsewhere: Secondary | ICD-10-CM | POA: Diagnosis not present

## 2017-01-16 DIAGNOSIS — I4891 Unspecified atrial fibrillation: Secondary | ICD-10-CM | POA: Diagnosis not present

## 2017-01-16 NOTE — Patient Outreach (Signed)
Transition of care call:  Placed call to patient who answered.  Reports that she is feeling weak. States that her hgb is low and she is going to the hematologist today. Reports that she thinks she might get a unit of blood and some iron.    Reports ambulating well. States that she now has a new walker.  Continues to be active with home health for therapy.  Reports healing of open skin to buttocks. Reports that she continues to use barrier cream and sits on the doughnut ring.    Denies any new problems or concerns today.  PLAN: Will continue weekly transition of care calls.  Tomasa Rand, RN, BSN, CEN [facility] [employer] [phone]

## 2017-01-18 DIAGNOSIS — Z471 Aftercare following joint replacement surgery: Secondary | ICD-10-CM | POA: Diagnosis not present

## 2017-01-18 DIAGNOSIS — E1122 Type 2 diabetes mellitus with diabetic chronic kidney disease: Secondary | ICD-10-CM | POA: Diagnosis not present

## 2017-01-18 DIAGNOSIS — M069 Rheumatoid arthritis, unspecified: Secondary | ICD-10-CM | POA: Diagnosis not present

## 2017-01-18 DIAGNOSIS — N189 Chronic kidney disease, unspecified: Secondary | ICD-10-CM | POA: Diagnosis not present

## 2017-01-18 DIAGNOSIS — I129 Hypertensive chronic kidney disease with stage 1 through stage 4 chronic kidney disease, or unspecified chronic kidney disease: Secondary | ICD-10-CM | POA: Diagnosis not present

## 2017-01-18 DIAGNOSIS — Z96651 Presence of right artificial knee joint: Secondary | ICD-10-CM | POA: Diagnosis not present

## 2017-01-22 ENCOUNTER — Other Ambulatory Visit: Payer: Self-pay

## 2017-01-22 DIAGNOSIS — E1169 Type 2 diabetes mellitus with other specified complication: Secondary | ICD-10-CM | POA: Diagnosis not present

## 2017-01-22 DIAGNOSIS — M069 Rheumatoid arthritis, unspecified: Secondary | ICD-10-CM | POA: Diagnosis not present

## 2017-01-22 DIAGNOSIS — I129 Hypertensive chronic kidney disease with stage 1 through stage 4 chronic kidney disease, or unspecified chronic kidney disease: Secondary | ICD-10-CM | POA: Diagnosis not present

## 2017-01-22 DIAGNOSIS — N189 Chronic kidney disease, unspecified: Secondary | ICD-10-CM | POA: Diagnosis not present

## 2017-01-22 DIAGNOSIS — E1122 Type 2 diabetes mellitus with diabetic chronic kidney disease: Secondary | ICD-10-CM | POA: Diagnosis not present

## 2017-01-22 DIAGNOSIS — Z96651 Presence of right artificial knee joint: Secondary | ICD-10-CM | POA: Diagnosis not present

## 2017-01-22 DIAGNOSIS — Z471 Aftercare following joint replacement surgery: Secondary | ICD-10-CM | POA: Diagnosis not present

## 2017-01-22 DIAGNOSIS — E1149 Type 2 diabetes mellitus with other diabetic neurological complication: Secondary | ICD-10-CM | POA: Diagnosis not present

## 2017-01-22 NOTE — Patient Outreach (Signed)
Transition of care: Placed call to patient who reports that she is doing well. Reports area to buttock is healing.  States the area is much smaller. Reports today CBG of 125.  Reports that she saw her "blood doctor" last week and her hgb was 9.7 so she did not have to get a unit of blood.  Patient reports that she still does not have her energy back.  Reports that she continues to have physical therapy through this week.   Reports no new problems or concerns today.  PLAN: Will call patient in 4-5 week for telephone follow up.  Patient agreed.   Tomasa Rand, RN, BSN, CEN [facility] [employer] [phone]

## 2017-01-23 ENCOUNTER — Ambulatory Visit: Payer: Self-pay

## 2017-01-24 DIAGNOSIS — K210 Gastro-esophageal reflux disease with esophagitis: Secondary | ICD-10-CM | POA: Diagnosis not present

## 2017-01-24 DIAGNOSIS — K920 Hematemesis: Secondary | ICD-10-CM | POA: Diagnosis not present

## 2017-01-24 DIAGNOSIS — K591 Functional diarrhea: Secondary | ICD-10-CM | POA: Diagnosis not present

## 2017-01-24 DIAGNOSIS — K2270 Barrett's esophagus without dysplasia: Secondary | ICD-10-CM | POA: Diagnosis not present

## 2017-01-25 DIAGNOSIS — M069 Rheumatoid arthritis, unspecified: Secondary | ICD-10-CM | POA: Diagnosis not present

## 2017-01-25 DIAGNOSIS — I129 Hypertensive chronic kidney disease with stage 1 through stage 4 chronic kidney disease, or unspecified chronic kidney disease: Secondary | ICD-10-CM | POA: Diagnosis not present

## 2017-01-25 DIAGNOSIS — N189 Chronic kidney disease, unspecified: Secondary | ICD-10-CM | POA: Diagnosis not present

## 2017-01-25 DIAGNOSIS — Z96651 Presence of right artificial knee joint: Secondary | ICD-10-CM | POA: Diagnosis not present

## 2017-01-25 DIAGNOSIS — Z471 Aftercare following joint replacement surgery: Secondary | ICD-10-CM | POA: Diagnosis not present

## 2017-01-25 DIAGNOSIS — E1122 Type 2 diabetes mellitus with diabetic chronic kidney disease: Secondary | ICD-10-CM | POA: Diagnosis not present

## 2017-01-29 DIAGNOSIS — R32 Unspecified urinary incontinence: Secondary | ICD-10-CM | POA: Diagnosis not present

## 2017-01-29 DIAGNOSIS — N183 Chronic kidney disease, stage 3 (moderate): Secondary | ICD-10-CM | POA: Diagnosis not present

## 2017-01-29 DIAGNOSIS — I129 Hypertensive chronic kidney disease with stage 1 through stage 4 chronic kidney disease, or unspecified chronic kidney disease: Secondary | ICD-10-CM | POA: Diagnosis not present

## 2017-01-29 DIAGNOSIS — E1149 Type 2 diabetes mellitus with other diabetic neurological complication: Secondary | ICD-10-CM | POA: Diagnosis not present

## 2017-01-29 DIAGNOSIS — E1122 Type 2 diabetes mellitus with diabetic chronic kidney disease: Secondary | ICD-10-CM | POA: Diagnosis not present

## 2017-02-05 DIAGNOSIS — M0589 Other rheumatoid arthritis with rheumatoid factor of multiple sites: Secondary | ICD-10-CM | POA: Diagnosis not present

## 2017-02-11 DIAGNOSIS — H1859 Other hereditary corneal dystrophies: Secondary | ICD-10-CM | POA: Diagnosis not present

## 2017-02-11 DIAGNOSIS — E119 Type 2 diabetes mellitus without complications: Secondary | ICD-10-CM | POA: Diagnosis not present

## 2017-02-11 DIAGNOSIS — Z79899 Other long term (current) drug therapy: Secondary | ICD-10-CM | POA: Diagnosis not present

## 2017-02-14 ENCOUNTER — Ambulatory Visit (INDEPENDENT_AMBULATORY_CARE_PROVIDER_SITE_OTHER): Payer: Medicare Other

## 2017-02-14 ENCOUNTER — Encounter (INDEPENDENT_AMBULATORY_CARE_PROVIDER_SITE_OTHER): Payer: Self-pay | Admitting: Specialist

## 2017-02-14 ENCOUNTER — Ambulatory Visit (INDEPENDENT_AMBULATORY_CARE_PROVIDER_SITE_OTHER): Payer: Medicare Other | Admitting: Specialist

## 2017-02-14 VITALS — BP 133/54 | HR 79 | Ht 60.0 in | Wt 160.0 lb

## 2017-02-14 DIAGNOSIS — Z96651 Presence of right artificial knee joint: Secondary | ICD-10-CM | POA: Diagnosis not present

## 2017-02-14 DIAGNOSIS — D500 Iron deficiency anemia secondary to blood loss (chronic): Secondary | ICD-10-CM | POA: Diagnosis not present

## 2017-02-14 DIAGNOSIS — N189 Chronic kidney disease, unspecified: Secondary | ICD-10-CM | POA: Diagnosis not present

## 2017-02-14 DIAGNOSIS — I4891 Unspecified atrial fibrillation: Secondary | ICD-10-CM | POA: Diagnosis not present

## 2017-02-14 DIAGNOSIS — D631 Anemia in chronic kidney disease: Secondary | ICD-10-CM | POA: Diagnosis not present

## 2017-02-14 DIAGNOSIS — D638 Anemia in other chronic diseases classified elsewhere: Secondary | ICD-10-CM | POA: Diagnosis not present

## 2017-02-14 DIAGNOSIS — M069 Rheumatoid arthritis, unspecified: Secondary | ICD-10-CM | POA: Diagnosis not present

## 2017-02-14 NOTE — Progress Notes (Signed)
Post-Op Visit Note   Patient: Gloria Best           Date of Birth: [birth year]           MRN: [id number] Visit Date: 02/14/2017 PCP: Marco Collie, MD   Assessment & Plan:  Chief Complaint:  Chief Complaint  Patient presents with  . Right Knee - Routine Post Op   Visit Diagnoses:  1. Status post total right knee replacement     Plan: Patient is doing great. Advised that I am extremely pleased with her progress up to this point and hard work. Follow-up in 4 months for recheck when she is 6 months out. Will return sooner if needed. Continue home exercise program.  Follow-Up Instructions: Return in about 4 months (around 06/16/2017).   Orders:  Orders Placed This Encounter  Procedures  . XR Knee 1-2 Views Right   No orders of the defined types were placed in this encounter.   Imaging: Xr Knee 1-2 Views Right  Result Date: 02/14/2017 X-ray right knee AP lateral view shows good alignment and seating of the prosthesis. No complicating features.   PMFS History: Patient Active Problem List   Diagnosis Date Noted  . Protein-calorie malnutrition, severe 12/26/2016  . Constipated   . Altered mental status   . Acute renal failure (ARF) ([place])   . Hematemesis with nausea   . Elevated WBC count 12/21/2016  . At risk for adverse drug event 12/14/2016  . Type 2 diabetes mellitus with polyneuropathy ([place]) 12/13/2016    Class: Chronic  . Postoperative urinary retention 12/13/2016    Class: Acute  . Unilateral primary osteoarthritis, right knee 12/10/2016    Class: Chronic  . Tricompartment osteoarthritis of right knee 12/10/2016  . Postoperative anemia due to acute blood loss 06/12/2012    Class: Acute  . AVN (avascular necrosis of bone) ([place]) 06/09/2012    Class: Chronic  . Osteoarthritis of right hip 06/09/2012    Class: Chronic   Past Medical History:  Diagnosis Date  . Anemia    hx of blood transfusion  . Anxiety   . Arthritis    RA  . Asthma    hx of as  child  . Bladder disorder    having difficulty voiding, requires in and out catheter 2x day and prn; denied current need for I&O cath 08/27/16  . Bronchitis    hx of  . Carpal tunnel syndrome of left wrist    hx of  . Chronic back pain   . Chronic kidney disease   . Depression    takes medication for   . Diabetes mellitus    oral medication  . Dysrhythmia    takes cardizem, sees Dr. Marco Collie primary  . Fibromyalgia    takes lyrica  . GERD (gastroesophageal reflux disease)   . H/O hiatal hernia    hx of   . Heart murmur   . Hypertension   . Pneumonia    hx of  . Sleep apnea    no CPAP. last sleep study over 10 years ago  . Urinary tract infection    hx of  . Wears dentures    top    Family History  Problem Relation Age of Onset  . Diabetes Mother   . Diabetes Father   . Diabetes Sister   . Diabetes Brother   . Heart disease Neg Hx   . Stroke Neg Hx   . Cancer Neg Hx     Past  Surgical History:  Procedure Laterality Date  . ABDOMINAL HYSTERECTOMY    . APPENDECTOMY    . BACK SURGERY      x 3  . CARPAL TUNNEL RELEASE     left side only  . CATARACT EXTRACTION     bilateral  . CESAREAN SECTION    . COLON SURGERY     "due to large polyp"  . COLONOSCOPY    . ESOPHAGOGASTRODUODENOSCOPY (EGD) WITH PROPOFOL N/A 12/26/2016   Procedure: ESOPHAGOGASTRODUODENOSCOPY (EGD) WITH PROPOFOL;  Surgeon: Doran Stabler, MD;  Location: [place];  Service: Endoscopy;  Laterality: N/A;  . FOOT SURGERY    . HERNIA REPAIR    . JOINT REPLACEMENT    . SPINE SURGERY    . TENOLYSIS Left 11/12/2013   Procedure: LEFT TENDON GRAFT RECONSTRUCTION OF LEFT LONG FINGER SAGITTAL FIBERS CORD RELEASE;  Surgeon: Cammie Sickle., MD;  Location: [place];  Service: Orthopedics;  Laterality: Left;  . TOTAL HIP ARTHROPLASTY  06/09/2012   Procedure: TOTAL HIP ARTHROPLASTY;  Surgeon: [name], MD;  Location: [place];  Service: Orthopedics;  Laterality: Right;  Right total  hip replacement  . TOTAL KNEE ARTHROPLASTY Right 12/10/2016   Procedure: RIGHT TOTAL KNEE ARTHROPLASTY;  Surgeon: [name], MD;  Location: [place];  Service: Orthopedics;  Laterality: Right;   Social History   Occupational History  . Not on file.   Social History Main Topics  . Smoking status: Former Smoker    Quit date: 11/05/1984  . Smokeless tobacco: Never Used     Comment: stopped smoking 1983  . Alcohol use No  . Drug use: No  . Sexual activity: No   HPI: Patient states that her knee is doing great. Continues to work hard with home exercises. She is pleased with her surgical result.  Exam Ambulating well extremely well with walker. Range of motion about 0-115

## 2017-02-20 DIAGNOSIS — Z6830 Body mass index (BMI) 30.0-30.9, adult: Secondary | ICD-10-CM | POA: Diagnosis not present

## 2017-02-20 DIAGNOSIS — M150 Primary generalized (osteo)arthritis: Secondary | ICD-10-CM | POA: Diagnosis not present

## 2017-02-20 DIAGNOSIS — M25551 Pain in right hip: Secondary | ICD-10-CM | POA: Diagnosis not present

## 2017-02-20 DIAGNOSIS — E669 Obesity, unspecified: Secondary | ICD-10-CM | POA: Diagnosis not present

## 2017-02-20 DIAGNOSIS — R7612 Nonspecific reaction to cell mediated immunity measurement of gamma interferon antigen response without active tuberculosis: Secondary | ICD-10-CM | POA: Diagnosis not present

## 2017-02-20 DIAGNOSIS — M0579 Rheumatoid arthritis with rheumatoid factor of multiple sites without organ or systems involvement: Secondary | ICD-10-CM | POA: Diagnosis not present

## 2017-02-20 DIAGNOSIS — M797 Fibromyalgia: Secondary | ICD-10-CM | POA: Diagnosis not present

## 2017-02-21 DIAGNOSIS — M2012 Hallux valgus (acquired), left foot: Secondary | ICD-10-CM | POA: Diagnosis not present

## 2017-02-21 DIAGNOSIS — Z872 Personal history of diseases of the skin and subcutaneous tissue: Secondary | ICD-10-CM | POA: Insufficient documentation

## 2017-02-21 DIAGNOSIS — E1142 Type 2 diabetes mellitus with diabetic polyneuropathy: Secondary | ICD-10-CM | POA: Diagnosis not present

## 2017-02-21 DIAGNOSIS — M2010 Hallux valgus (acquired), unspecified foot: Secondary | ICD-10-CM | POA: Insufficient documentation

## 2017-02-21 HISTORY — DX: Hallux valgus (acquired), unspecified foot: M20.10

## 2017-02-21 HISTORY — DX: Personal history of diseases of the skin and subcutaneous tissue: Z87.2

## 2017-02-25 ENCOUNTER — Other Ambulatory Visit: Payer: Self-pay

## 2017-02-25 NOTE — Patient Outreach (Signed)
Telephone follow up:  Attempted to reach patient x 2 today. Unsuccessful. No machine to leave a message.  Plan: Will attempt to reach patient again. Tomasa Rand, RN, BSN, CEN [facility] [employer] [phone]

## 2017-02-26 ENCOUNTER — Other Ambulatory Visit: Payer: Self-pay

## 2017-02-26 NOTE — Patient Outreach (Signed)
Telephone assessment: Attempted again to reach patient for follow up and potential case closure.  Unsuccessful.   PLAN: will attempt again in 1 week. Tomasa Rand, RN, BSN, CEN [facility] [employer] [phone]

## 2017-03-05 ENCOUNTER — Other Ambulatory Visit: Payer: Self-pay

## 2017-03-05 DIAGNOSIS — M0589 Other rheumatoid arthritis with rheumatoid factor of multiple sites: Secondary | ICD-10-CM | POA: Diagnosis not present

## 2017-03-05 NOTE — Patient Outreach (Signed)
Telephone assessment: 3rd attempt to reach patient unsuccessful.  This is unusual for patient. Placed call to MD office and spoke with Lelan Pons who confirms they have not heard from patient either since March.  Placed call to daughter. No answer. Left a message requesting a call back.  PLAN: will mail letter and continue to try to reach patient.  Tomasa Rand, RN, BSN, CEN [facility] [employer] [phone]

## 2017-03-07 DIAGNOSIS — R8271 Bacteriuria: Secondary | ICD-10-CM | POA: Diagnosis not present

## 2017-03-07 DIAGNOSIS — D631 Anemia in chronic kidney disease: Secondary | ICD-10-CM | POA: Diagnosis not present

## 2017-03-07 DIAGNOSIS — N189 Chronic kidney disease, unspecified: Secondary | ICD-10-CM | POA: Diagnosis not present

## 2017-03-07 DIAGNOSIS — N3281 Overactive bladder: Secondary | ICD-10-CM | POA: Diagnosis not present

## 2017-03-07 DIAGNOSIS — D500 Iron deficiency anemia secondary to blood loss (chronic): Secondary | ICD-10-CM | POA: Diagnosis not present

## 2017-03-07 DIAGNOSIS — M069 Rheumatoid arthritis, unspecified: Secondary | ICD-10-CM | POA: Diagnosis not present

## 2017-03-07 DIAGNOSIS — E538 Deficiency of other specified B group vitamins: Secondary | ICD-10-CM | POA: Diagnosis not present

## 2017-03-07 DIAGNOSIS — D509 Iron deficiency anemia, unspecified: Secondary | ICD-10-CM | POA: Diagnosis not present

## 2017-03-08 DIAGNOSIS — D649 Anemia, unspecified: Secondary | ICD-10-CM | POA: Diagnosis not present

## 2017-03-11 DIAGNOSIS — D649 Anemia, unspecified: Secondary | ICD-10-CM | POA: Diagnosis not present

## 2017-03-12 ENCOUNTER — Other Ambulatory Visit: Payer: Self-pay

## 2017-03-12 NOTE — Patient Outreach (Signed)
Case closure:  Incoming call from patient who identified herself.  Patient reports that she has recovered from knee surgery and is doing well. Reports wound on bottom has healed.  Denies any other concerns or questions at this time.  Reviewed case closure due to patient has meet her goals and she agrees.   Reviewed with patient to call in the future if she needs assistance and she has also agreed to this.  PLAN: will close case. Goals met. Will send letter to patient and MD.   Tomasa Rand, RN, BSN, CEN [place] Coordinator [phone]

## 2017-03-13 DIAGNOSIS — H1859 Other hereditary corneal dystrophies: Secondary | ICD-10-CM | POA: Diagnosis not present

## 2017-03-21 DIAGNOSIS — K2270 Barrett's esophagus without dysplasia: Secondary | ICD-10-CM | POA: Diagnosis not present

## 2017-03-21 DIAGNOSIS — D500 Iron deficiency anemia secondary to blood loss (chronic): Secondary | ICD-10-CM | POA: Diagnosis not present

## 2017-03-21 DIAGNOSIS — K219 Gastro-esophageal reflux disease without esophagitis: Secondary | ICD-10-CM | POA: Diagnosis not present

## 2017-03-21 DIAGNOSIS — D509 Iron deficiency anemia, unspecified: Secondary | ICD-10-CM | POA: Diagnosis not present

## 2017-03-28 DIAGNOSIS — D509 Iron deficiency anemia, unspecified: Secondary | ICD-10-CM | POA: Diagnosis not present

## 2017-04-02 DIAGNOSIS — E119 Type 2 diabetes mellitus without complications: Secondary | ICD-10-CM | POA: Diagnosis not present

## 2017-04-02 DIAGNOSIS — D509 Iron deficiency anemia, unspecified: Secondary | ICD-10-CM | POA: Diagnosis not present

## 2017-04-02 DIAGNOSIS — K3189 Other diseases of stomach and duodenum: Secondary | ICD-10-CM | POA: Diagnosis not present

## 2017-04-02 DIAGNOSIS — Z90710 Acquired absence of both cervix and uterus: Secondary | ICD-10-CM | POA: Diagnosis not present

## 2017-04-02 DIAGNOSIS — J45909 Unspecified asthma, uncomplicated: Secondary | ICD-10-CM | POA: Diagnosis not present

## 2017-04-02 DIAGNOSIS — I519 Heart disease, unspecified: Secondary | ICD-10-CM | POA: Diagnosis not present

## 2017-04-02 DIAGNOSIS — I10 Essential (primary) hypertension: Secondary | ICD-10-CM | POA: Diagnosis not present

## 2017-04-02 DIAGNOSIS — Z9049 Acquired absence of other specified parts of digestive tract: Secondary | ICD-10-CM | POA: Diagnosis not present

## 2017-04-02 DIAGNOSIS — D500 Iron deficiency anemia secondary to blood loss (chronic): Secondary | ICD-10-CM | POA: Diagnosis not present

## 2017-04-02 DIAGNOSIS — Z79899 Other long term (current) drug therapy: Secondary | ICD-10-CM | POA: Diagnosis not present

## 2017-04-02 DIAGNOSIS — K449 Diaphragmatic hernia without obstruction or gangrene: Secondary | ICD-10-CM | POA: Diagnosis not present

## 2017-04-08 DIAGNOSIS — N390 Urinary tract infection, site not specified: Secondary | ICD-10-CM | POA: Diagnosis not present

## 2017-04-16 DIAGNOSIS — E538 Deficiency of other specified B group vitamins: Secondary | ICD-10-CM | POA: Diagnosis not present

## 2017-04-16 DIAGNOSIS — D500 Iron deficiency anemia secondary to blood loss (chronic): Secondary | ICD-10-CM | POA: Diagnosis not present

## 2017-04-17 DIAGNOSIS — E538 Deficiency of other specified B group vitamins: Secondary | ICD-10-CM | POA: Diagnosis not present

## 2017-04-17 DIAGNOSIS — D500 Iron deficiency anemia secondary to blood loss (chronic): Secondary | ICD-10-CM | POA: Diagnosis not present

## 2017-04-18 DIAGNOSIS — M069 Rheumatoid arthritis, unspecified: Secondary | ICD-10-CM | POA: Diagnosis not present

## 2017-04-18 DIAGNOSIS — N179 Acute kidney failure, unspecified: Secondary | ICD-10-CM | POA: Diagnosis not present

## 2017-04-18 DIAGNOSIS — N189 Chronic kidney disease, unspecified: Secondary | ICD-10-CM | POA: Diagnosis not present

## 2017-04-18 DIAGNOSIS — D631 Anemia in chronic kidney disease: Secondary | ICD-10-CM | POA: Diagnosis not present

## 2017-04-18 DIAGNOSIS — I509 Heart failure, unspecified: Secondary | ICD-10-CM | POA: Diagnosis not present

## 2017-04-18 DIAGNOSIS — D638 Anemia in other chronic diseases classified elsewhere: Secondary | ICD-10-CM | POA: Diagnosis not present

## 2017-04-19 DIAGNOSIS — I509 Heart failure, unspecified: Secondary | ICD-10-CM | POA: Diagnosis not present

## 2017-04-19 DIAGNOSIS — N189 Chronic kidney disease, unspecified: Secondary | ICD-10-CM | POA: Diagnosis not present

## 2017-04-19 DIAGNOSIS — D638 Anemia in other chronic diseases classified elsewhere: Secondary | ICD-10-CM | POA: Diagnosis not present

## 2017-04-19 DIAGNOSIS — D631 Anemia in chronic kidney disease: Secondary | ICD-10-CM | POA: Diagnosis not present

## 2017-04-19 DIAGNOSIS — M069 Rheumatoid arthritis, unspecified: Secondary | ICD-10-CM | POA: Diagnosis not present

## 2017-04-24 DIAGNOSIS — D638 Anemia in other chronic diseases classified elsewhere: Secondary | ICD-10-CM | POA: Diagnosis not present

## 2017-04-24 DIAGNOSIS — E860 Dehydration: Secondary | ICD-10-CM | POA: Diagnosis not present

## 2017-04-24 DIAGNOSIS — I5032 Chronic diastolic (congestive) heart failure: Secondary | ICD-10-CM | POA: Diagnosis not present

## 2017-04-29 DIAGNOSIS — E663 Overweight: Secondary | ICD-10-CM | POA: Diagnosis not present

## 2017-04-29 DIAGNOSIS — Z139 Encounter for screening, unspecified: Secondary | ICD-10-CM | POA: Diagnosis not present

## 2017-04-29 DIAGNOSIS — I5032 Chronic diastolic (congestive) heart failure: Secondary | ICD-10-CM | POA: Diagnosis not present

## 2017-04-29 DIAGNOSIS — Z789 Other specified health status: Secondary | ICD-10-CM | POA: Diagnosis not present

## 2017-04-29 DIAGNOSIS — N183 Chronic kidney disease, stage 3 (moderate): Secondary | ICD-10-CM | POA: Diagnosis not present

## 2017-04-29 DIAGNOSIS — Z0000 Encounter for general adult medical examination without abnormal findings: Secondary | ICD-10-CM | POA: Diagnosis not present

## 2017-04-29 DIAGNOSIS — Z1389 Encounter for screening for other disorder: Secondary | ICD-10-CM | POA: Diagnosis not present

## 2017-04-29 DIAGNOSIS — D519 Vitamin B12 deficiency anemia, unspecified: Secondary | ICD-10-CM | POA: Diagnosis not present

## 2017-05-06 DIAGNOSIS — N189 Chronic kidney disease, unspecified: Secondary | ICD-10-CM | POA: Diagnosis not present

## 2017-05-06 DIAGNOSIS — I509 Heart failure, unspecified: Secondary | ICD-10-CM | POA: Diagnosis not present

## 2017-05-06 DIAGNOSIS — N183 Chronic kidney disease, stage 3 (moderate): Secondary | ICD-10-CM | POA: Diagnosis not present

## 2017-05-06 DIAGNOSIS — D500 Iron deficiency anemia secondary to blood loss (chronic): Secondary | ICD-10-CM | POA: Diagnosis not present

## 2017-05-06 DIAGNOSIS — M069 Rheumatoid arthritis, unspecified: Secondary | ICD-10-CM | POA: Diagnosis not present

## 2017-05-06 DIAGNOSIS — D638 Anemia in other chronic diseases classified elsewhere: Secondary | ICD-10-CM | POA: Diagnosis not present

## 2017-05-06 DIAGNOSIS — D631 Anemia in chronic kidney disease: Secondary | ICD-10-CM | POA: Diagnosis not present

## 2017-05-06 DIAGNOSIS — E119 Type 2 diabetes mellitus without complications: Secondary | ICD-10-CM | POA: Diagnosis not present

## 2017-05-13 DIAGNOSIS — I129 Hypertensive chronic kidney disease with stage 1 through stage 4 chronic kidney disease, or unspecified chronic kidney disease: Secondary | ICD-10-CM | POA: Diagnosis not present

## 2017-05-13 DIAGNOSIS — D519 Vitamin B12 deficiency anemia, unspecified: Secondary | ICD-10-CM | POA: Diagnosis not present

## 2017-05-13 DIAGNOSIS — K633 Ulcer of intestine: Secondary | ICD-10-CM | POA: Diagnosis not present

## 2017-05-13 DIAGNOSIS — N189 Chronic kidney disease, unspecified: Secondary | ICD-10-CM | POA: Diagnosis not present

## 2017-05-13 DIAGNOSIS — E119 Type 2 diabetes mellitus without complications: Secondary | ICD-10-CM | POA: Diagnosis not present

## 2017-05-13 DIAGNOSIS — K640 First degree hemorrhoids: Secondary | ICD-10-CM | POA: Diagnosis not present

## 2017-05-13 DIAGNOSIS — K219 Gastro-esophageal reflux disease without esophagitis: Secondary | ICD-10-CM | POA: Diagnosis not present

## 2017-05-13 DIAGNOSIS — D500 Iron deficiency anemia secondary to blood loss (chronic): Secondary | ICD-10-CM | POA: Diagnosis not present

## 2017-05-13 DIAGNOSIS — E1149 Type 2 diabetes mellitus with other diabetic neurological complication: Secondary | ICD-10-CM | POA: Diagnosis not present

## 2017-05-13 DIAGNOSIS — K5792 Diverticulitis of intestine, part unspecified, without perforation or abscess without bleeding: Secondary | ICD-10-CM | POA: Diagnosis not present

## 2017-05-13 DIAGNOSIS — I5032 Chronic diastolic (congestive) heart failure: Secondary | ICD-10-CM | POA: Diagnosis not present

## 2017-05-13 DIAGNOSIS — E1122 Type 2 diabetes mellitus with diabetic chronic kidney disease: Secondary | ICD-10-CM | POA: Diagnosis not present

## 2017-05-14 DIAGNOSIS — I509 Heart failure, unspecified: Secondary | ICD-10-CM | POA: Diagnosis present

## 2017-05-14 DIAGNOSIS — D500 Iron deficiency anemia secondary to blood loss (chronic): Secondary | ICD-10-CM | POA: Diagnosis not present

## 2017-05-14 DIAGNOSIS — Z86010 Personal history of colonic polyps: Secondary | ICD-10-CM | POA: Diagnosis not present

## 2017-05-14 DIAGNOSIS — Z9079 Acquired absence of other genital organ(s): Secondary | ICD-10-CM | POA: Diagnosis not present

## 2017-05-14 DIAGNOSIS — K640 First degree hemorrhoids: Secondary | ICD-10-CM | POA: Diagnosis not present

## 2017-05-14 DIAGNOSIS — K5730 Diverticulosis of large intestine without perforation or abscess without bleeding: Secondary | ICD-10-CM | POA: Diagnosis present

## 2017-05-14 DIAGNOSIS — K5792 Diverticulitis of intestine, part unspecified, without perforation or abscess without bleeding: Secondary | ICD-10-CM | POA: Diagnosis not present

## 2017-05-14 DIAGNOSIS — I130 Hypertensive heart and chronic kidney disease with heart failure and stage 1 through stage 4 chronic kidney disease, or unspecified chronic kidney disease: Secondary | ICD-10-CM | POA: Diagnosis present

## 2017-05-14 DIAGNOSIS — I4891 Unspecified atrial fibrillation: Secondary | ICD-10-CM | POA: Diagnosis present

## 2017-05-14 DIAGNOSIS — Z7901 Long term (current) use of anticoagulants: Secondary | ICD-10-CM | POA: Diagnosis not present

## 2017-05-14 DIAGNOSIS — K5900 Constipation, unspecified: Secondary | ICD-10-CM | POA: Diagnosis present

## 2017-05-14 DIAGNOSIS — N189 Chronic kidney disease, unspecified: Secondary | ICD-10-CM | POA: Diagnosis present

## 2017-05-14 DIAGNOSIS — D519 Vitamin B12 deficiency anemia, unspecified: Secondary | ICD-10-CM | POA: Diagnosis not present

## 2017-05-14 DIAGNOSIS — K219 Gastro-esophageal reflux disease without esophagitis: Secondary | ICD-10-CM | POA: Diagnosis not present

## 2017-05-14 DIAGNOSIS — K633 Ulcer of intestine: Secondary | ICD-10-CM | POA: Diagnosis not present

## 2017-05-14 DIAGNOSIS — D509 Iron deficiency anemia, unspecified: Secondary | ICD-10-CM | POA: Diagnosis present

## 2017-05-14 DIAGNOSIS — Z87891 Personal history of nicotine dependence: Secondary | ICD-10-CM | POA: Diagnosis not present

## 2017-05-14 DIAGNOSIS — E119 Type 2 diabetes mellitus without complications: Secondary | ICD-10-CM | POA: Diagnosis not present

## 2017-05-14 DIAGNOSIS — Z79899 Other long term (current) drug therapy: Secondary | ICD-10-CM | POA: Diagnosis not present

## 2017-05-14 DIAGNOSIS — J449 Chronic obstructive pulmonary disease, unspecified: Secondary | ICD-10-CM | POA: Diagnosis present

## 2017-05-14 DIAGNOSIS — K648 Other hemorrhoids: Secondary | ICD-10-CM | POA: Diagnosis present

## 2017-05-14 DIAGNOSIS — M069 Rheumatoid arthritis, unspecified: Secondary | ICD-10-CM | POA: Diagnosis present

## 2017-05-20 DIAGNOSIS — N189 Chronic kidney disease, unspecified: Secondary | ICD-10-CM | POA: Diagnosis not present

## 2017-05-20 DIAGNOSIS — M069 Rheumatoid arthritis, unspecified: Secondary | ICD-10-CM | POA: Diagnosis not present

## 2017-05-20 DIAGNOSIS — Z7901 Long term (current) use of anticoagulants: Secondary | ICD-10-CM | POA: Diagnosis not present

## 2017-05-20 DIAGNOSIS — I509 Heart failure, unspecified: Secondary | ICD-10-CM | POA: Diagnosis not present

## 2017-05-20 DIAGNOSIS — I4891 Unspecified atrial fibrillation: Secondary | ICD-10-CM | POA: Diagnosis not present

## 2017-05-20 DIAGNOSIS — D631 Anemia in chronic kidney disease: Secondary | ICD-10-CM | POA: Diagnosis not present

## 2017-05-20 DIAGNOSIS — K529 Noninfective gastroenteritis and colitis, unspecified: Secondary | ICD-10-CM | POA: Diagnosis not present

## 2017-05-20 DIAGNOSIS — D638 Anemia in other chronic diseases classified elsewhere: Secondary | ICD-10-CM | POA: Diagnosis not present

## 2017-05-20 DIAGNOSIS — D500 Iron deficiency anemia secondary to blood loss (chronic): Secondary | ICD-10-CM | POA: Diagnosis not present

## 2017-05-21 DIAGNOSIS — D638 Anemia in other chronic diseases classified elsewhere: Secondary | ICD-10-CM | POA: Diagnosis not present

## 2017-05-21 DIAGNOSIS — N189 Chronic kidney disease, unspecified: Secondary | ICD-10-CM | POA: Diagnosis not present

## 2017-05-21 DIAGNOSIS — I509 Heart failure, unspecified: Secondary | ICD-10-CM | POA: Diagnosis not present

## 2017-05-21 DIAGNOSIS — M069 Rheumatoid arthritis, unspecified: Secondary | ICD-10-CM | POA: Diagnosis not present

## 2017-05-21 DIAGNOSIS — D631 Anemia in chronic kidney disease: Secondary | ICD-10-CM | POA: Diagnosis not present

## 2017-05-23 DIAGNOSIS — M2012 Hallux valgus (acquired), left foot: Secondary | ICD-10-CM | POA: Diagnosis not present

## 2017-05-23 DIAGNOSIS — M21622 Bunionette of left foot: Secondary | ICD-10-CM | POA: Diagnosis not present

## 2017-05-23 DIAGNOSIS — E1142 Type 2 diabetes mellitus with diabetic polyneuropathy: Secondary | ICD-10-CM | POA: Diagnosis not present

## 2017-05-23 DIAGNOSIS — Z9189 Other specified personal risk factors, not elsewhere classified: Secondary | ICD-10-CM | POA: Diagnosis not present

## 2017-05-23 DIAGNOSIS — K633 Ulcer of intestine: Secondary | ICD-10-CM | POA: Diagnosis not present

## 2017-05-23 DIAGNOSIS — Z872 Personal history of diseases of the skin and subcutaneous tissue: Secondary | ICD-10-CM | POA: Diagnosis not present

## 2017-05-23 DIAGNOSIS — Z6832 Body mass index (BMI) 32.0-32.9, adult: Secondary | ICD-10-CM | POA: Diagnosis not present

## 2017-05-23 DIAGNOSIS — D500 Iron deficiency anemia secondary to blood loss (chronic): Secondary | ICD-10-CM | POA: Diagnosis not present

## 2017-05-24 DIAGNOSIS — M0589 Other rheumatoid arthritis with rheumatoid factor of multiple sites: Secondary | ICD-10-CM | POA: Diagnosis not present

## 2017-05-27 DIAGNOSIS — M069 Rheumatoid arthritis, unspecified: Secondary | ICD-10-CM | POA: Diagnosis not present

## 2017-05-27 DIAGNOSIS — N189 Chronic kidney disease, unspecified: Secondary | ICD-10-CM | POA: Diagnosis not present

## 2017-05-27 DIAGNOSIS — D631 Anemia in chronic kidney disease: Secondary | ICD-10-CM | POA: Diagnosis not present

## 2017-05-27 DIAGNOSIS — I509 Heart failure, unspecified: Secondary | ICD-10-CM | POA: Diagnosis not present

## 2017-05-27 DIAGNOSIS — D638 Anemia in other chronic diseases classified elsewhere: Secondary | ICD-10-CM | POA: Diagnosis not present

## 2017-05-27 DIAGNOSIS — D509 Iron deficiency anemia, unspecified: Secondary | ICD-10-CM | POA: Diagnosis not present

## 2017-05-29 DIAGNOSIS — M069 Rheumatoid arthritis, unspecified: Secondary | ICD-10-CM | POA: Diagnosis not present

## 2017-05-29 DIAGNOSIS — D519 Vitamin B12 deficiency anemia, unspecified: Secondary | ICD-10-CM | POA: Diagnosis not present

## 2017-05-29 DIAGNOSIS — I5032 Chronic diastolic (congestive) heart failure: Secondary | ICD-10-CM | POA: Diagnosis not present

## 2017-05-29 DIAGNOSIS — D638 Anemia in other chronic diseases classified elsewhere: Secondary | ICD-10-CM | POA: Diagnosis not present

## 2017-05-31 DIAGNOSIS — M797 Fibromyalgia: Secondary | ICD-10-CM | POA: Diagnosis not present

## 2017-05-31 DIAGNOSIS — M0579 Rheumatoid arthritis with rheumatoid factor of multiple sites without organ or systems involvement: Secondary | ICD-10-CM | POA: Diagnosis not present

## 2017-05-31 DIAGNOSIS — Z6829 Body mass index (BMI) 29.0-29.9, adult: Secondary | ICD-10-CM | POA: Diagnosis not present

## 2017-05-31 DIAGNOSIS — R7612 Nonspecific reaction to cell mediated immunity measurement of gamma interferon antigen response without active tuberculosis: Secondary | ICD-10-CM | POA: Diagnosis not present

## 2017-05-31 DIAGNOSIS — M150 Primary generalized (osteo)arthritis: Secondary | ICD-10-CM | POA: Diagnosis not present

## 2017-05-31 DIAGNOSIS — E663 Overweight: Secondary | ICD-10-CM | POA: Diagnosis not present

## 2017-06-03 DIAGNOSIS — D631 Anemia in chronic kidney disease: Secondary | ICD-10-CM | POA: Diagnosis not present

## 2017-06-03 DIAGNOSIS — N189 Chronic kidney disease, unspecified: Secondary | ICD-10-CM | POA: Diagnosis not present

## 2017-06-03 DIAGNOSIS — I509 Heart failure, unspecified: Secondary | ICD-10-CM | POA: Diagnosis not present

## 2017-06-03 DIAGNOSIS — R350 Frequency of micturition: Secondary | ICD-10-CM | POA: Diagnosis not present

## 2017-06-03 DIAGNOSIS — D500 Iron deficiency anemia secondary to blood loss (chronic): Secondary | ICD-10-CM | POA: Diagnosis not present

## 2017-06-03 DIAGNOSIS — M069 Rheumatoid arthritis, unspecified: Secondary | ICD-10-CM | POA: Diagnosis not present

## 2017-06-03 DIAGNOSIS — K219 Gastro-esophageal reflux disease without esophagitis: Secondary | ICD-10-CM | POA: Diagnosis not present

## 2017-06-03 DIAGNOSIS — D638 Anemia in other chronic diseases classified elsewhere: Secondary | ICD-10-CM | POA: Diagnosis not present

## 2017-06-06 DIAGNOSIS — Z6831 Body mass index (BMI) 31.0-31.9, adult: Secondary | ICD-10-CM | POA: Diagnosis not present

## 2017-06-06 DIAGNOSIS — I5032 Chronic diastolic (congestive) heart failure: Secondary | ICD-10-CM | POA: Diagnosis not present

## 2017-06-10 IMAGING — CR DG CHEST 1V PORT
1 series · 1 of 1 positions shown · non-contrast
Comparison: 12/10/2016

CLINICAL DATA: Cough, vomiting and abdominal pain.

EXAM:
PORTABLE CHEST 1 VIEW

[AP]
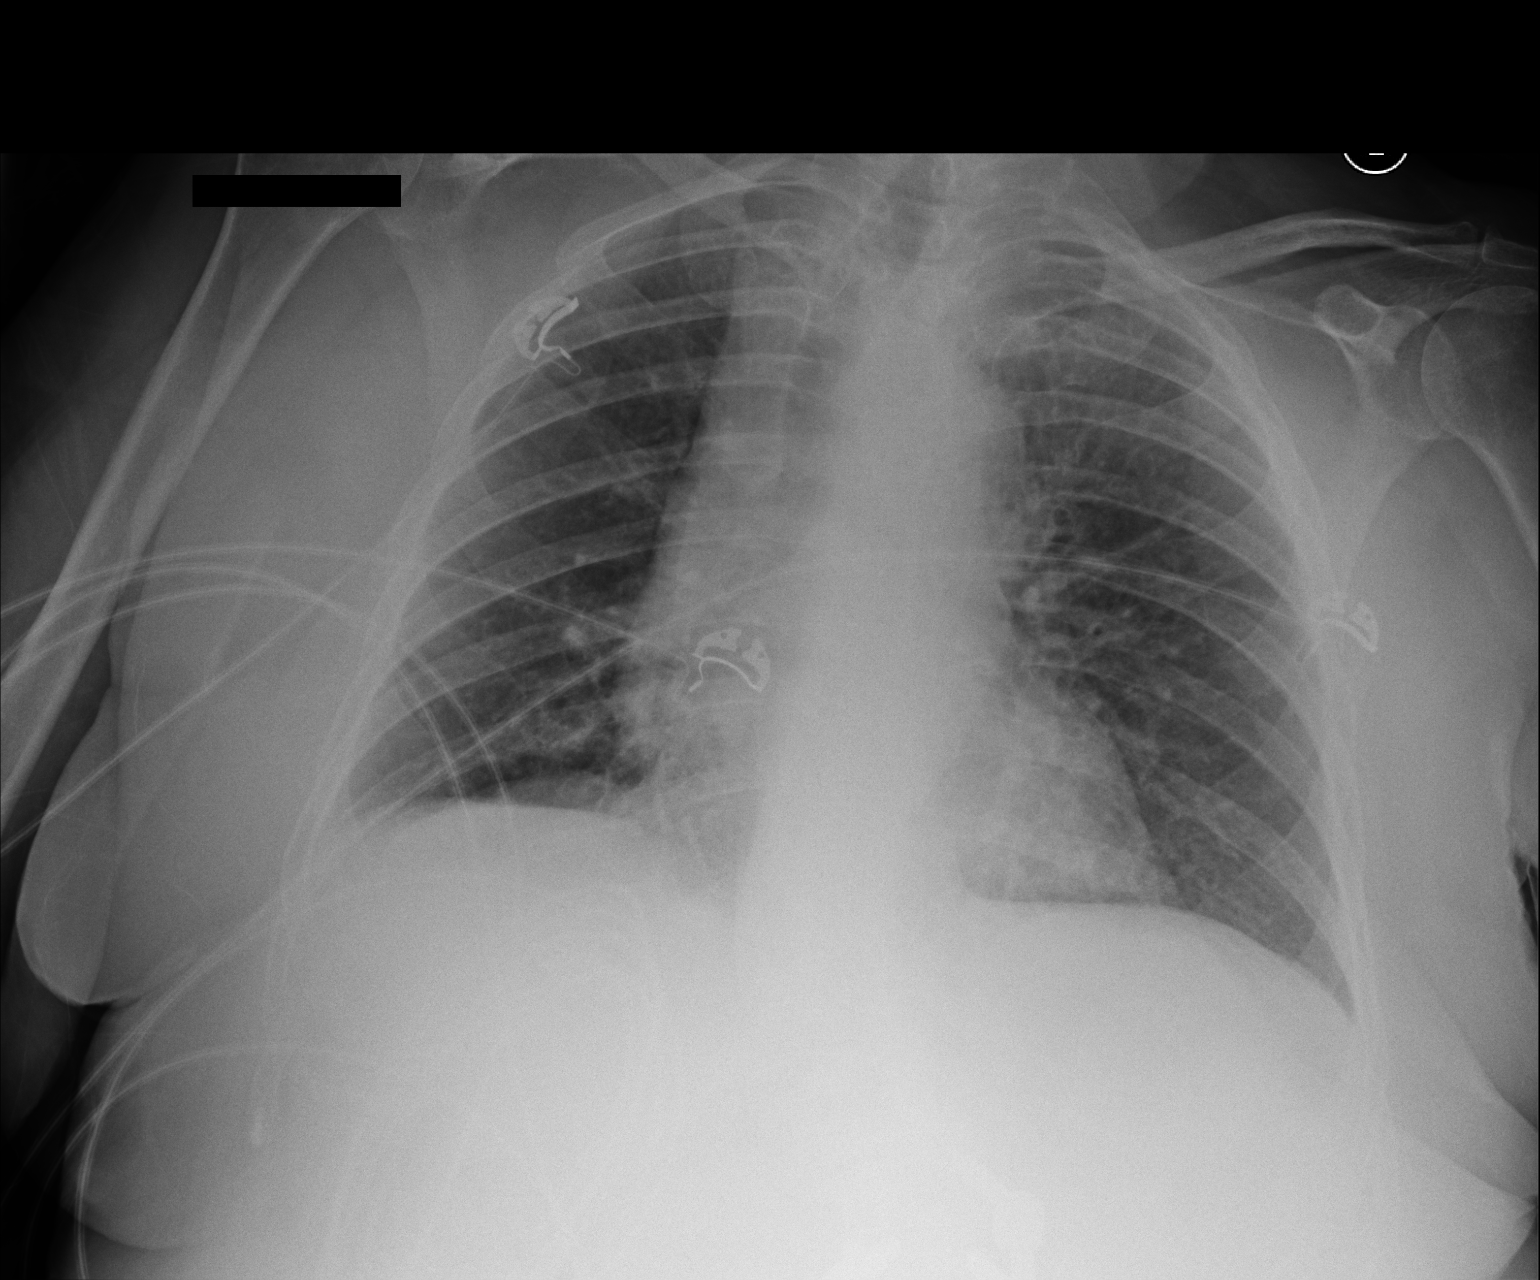

[1 of 1 positions shown; findings below may reference images not displayed]

FINDINGS: The heart is normal in size. The mediastinal and hilar contours are
stable. Mild tortuosity of the thoracic aorta. The lungs are clear.
No pleural effusion. The right IJ catheter has been removed. The
bony thorax is intact.
IMPRESSION: No acute cardiopulmonary findings.

## 2017-06-10 IMAGING — CT CT ABD-PELV W/O CM
2 of 4 series · 17 of 46 positions shown, 19 images · non-contrast
Comparison: CT scan of September 18, 2014.

CLINICAL DATA: Acute mid abdominal pain.

EXAM:
CT ABDOMEN AND PELVIS WITHOUT CONTRAST
TECHNIQUE: Multidetector CT imaging of the abdomen and pelvis was performed
following the standard protocol without IV contrast.

[Series 2: a/p w/o 5mm (person_name) · axial · non-contrast · 0.83mm/px · z∈[+816,+1196]mm · 14 of 84 slices shown, 16 images]
[im 4/84  soft-tissue]
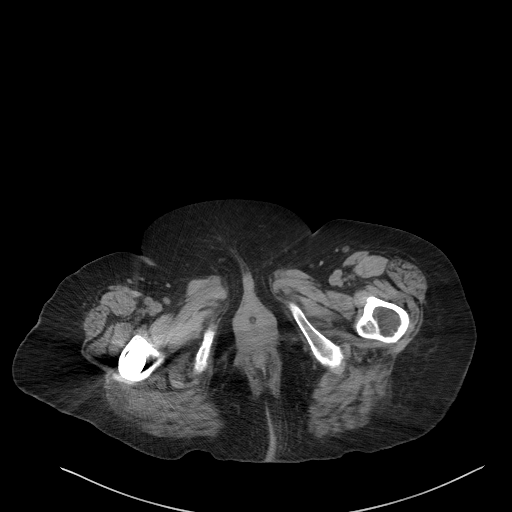
[im 4/84  bone]
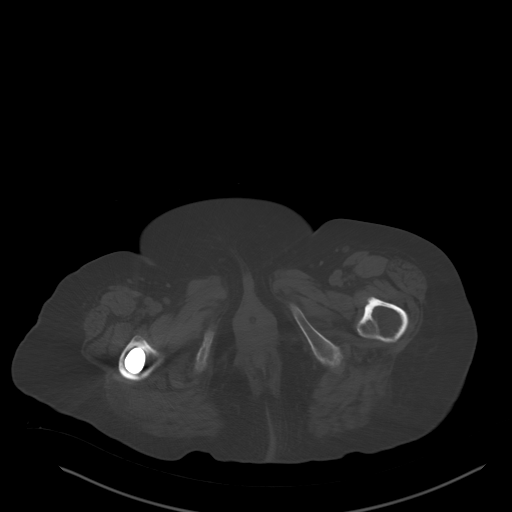
[im 11/84  soft-tissue]
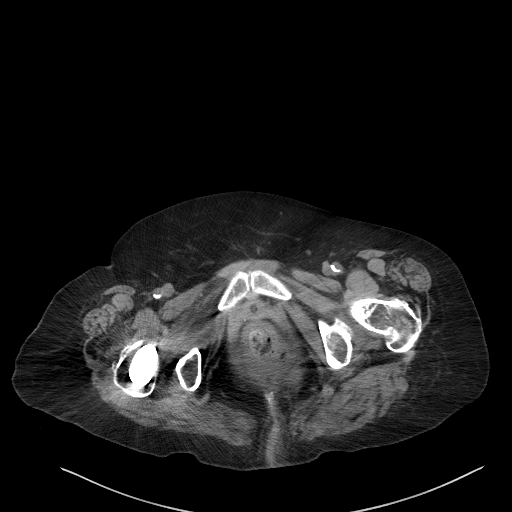
[im 15/84  soft-tissue]
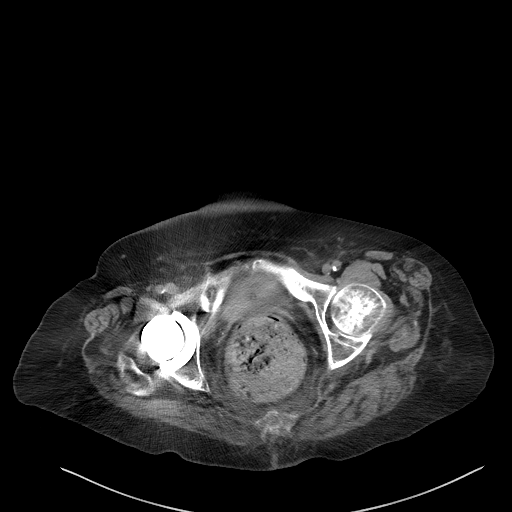
[im 22/84  soft-tissue]
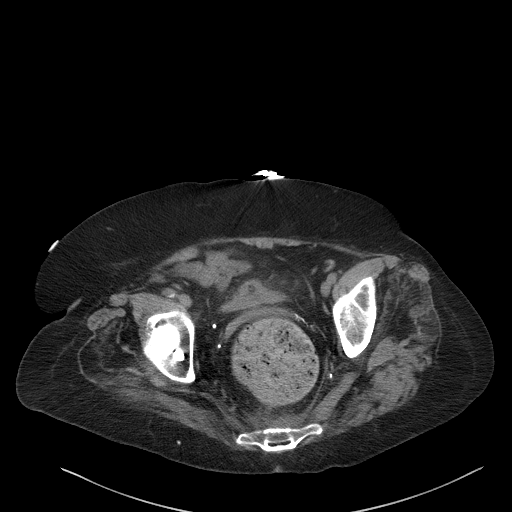
[im 29/84  soft-tissue]
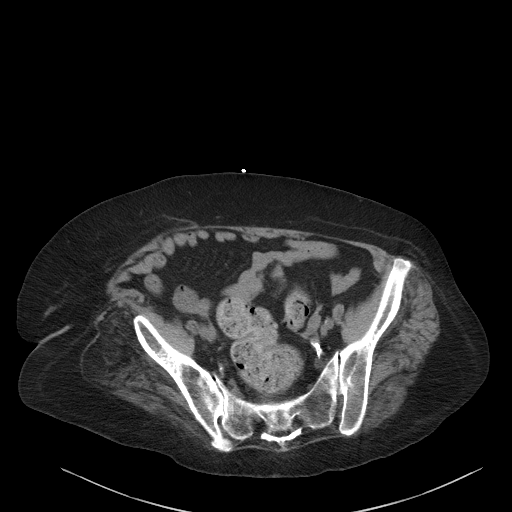
[im 33/84  soft-tissue]
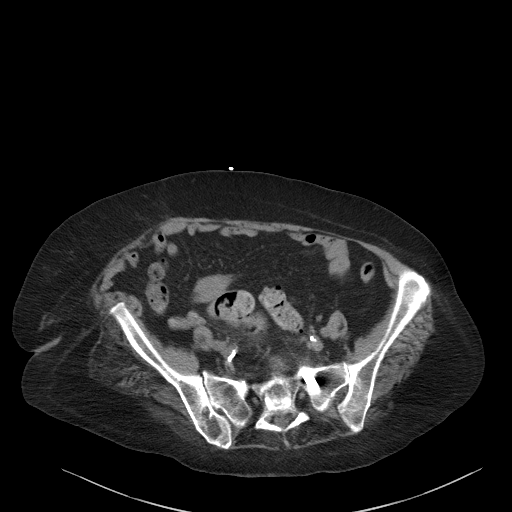
[im 40/84  soft-tissue]
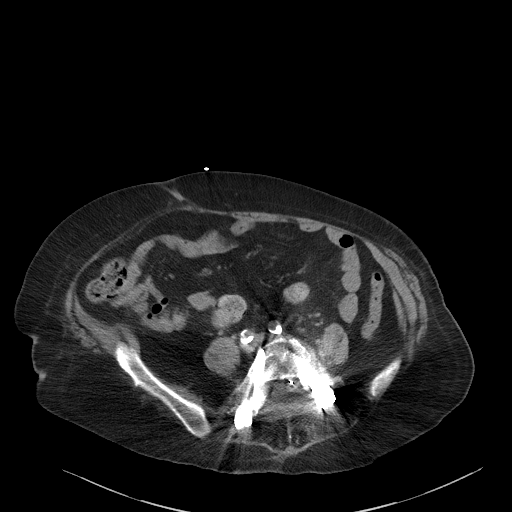
[im 44/84  soft-tissue]
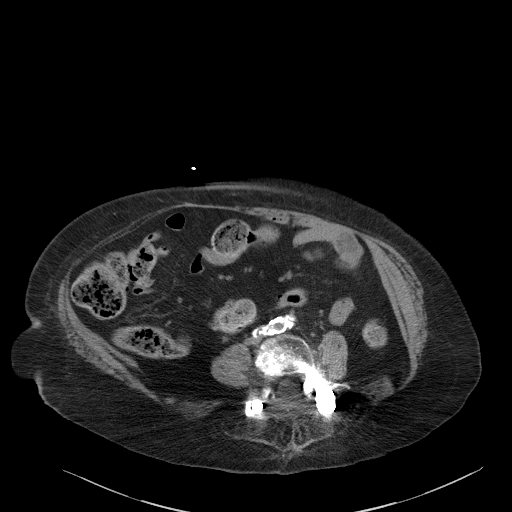
[im 51/84  soft-tissue]
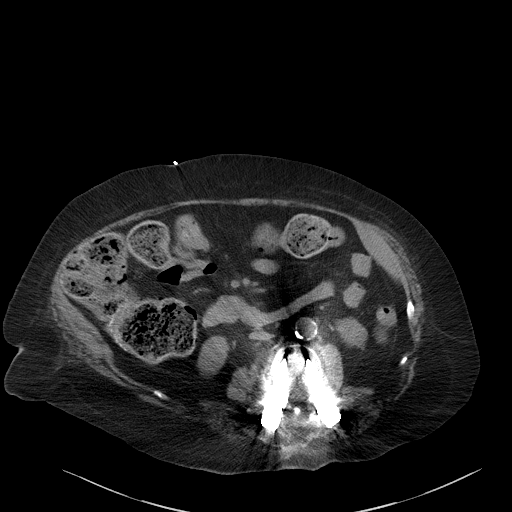
[im 51/84  bone]
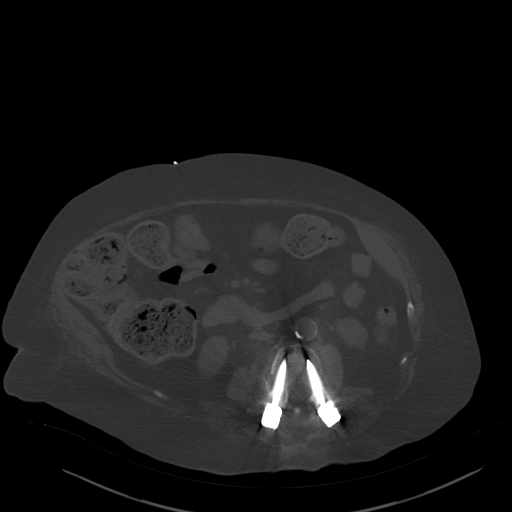
[im 55/84  soft-tissue]
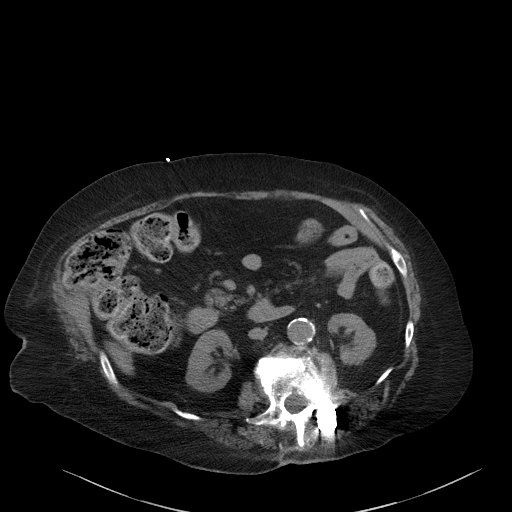
[im 62/84  soft-tissue]
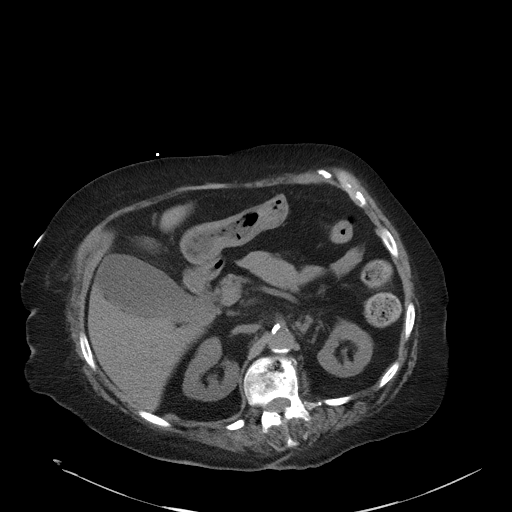
[im 69/84  soft-tissue]
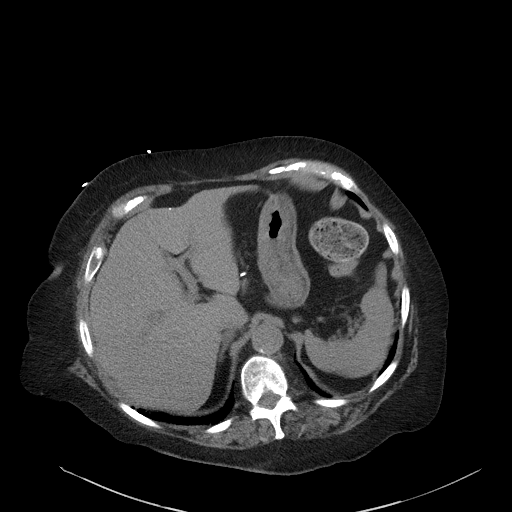
[im 73/84  soft-tissue]
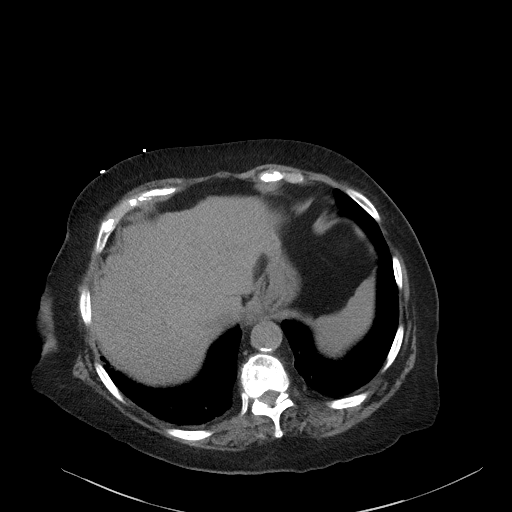
[im 80/84  soft-tissue]
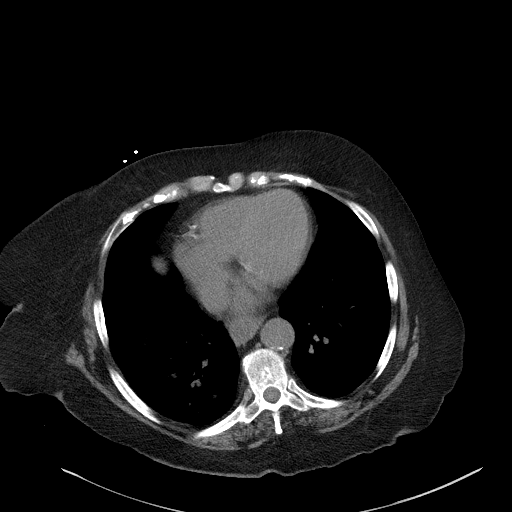

[Series 5: a/p w/o cor · coronal · non-contrast · 0.80mm/px · 3 of 127 slices shown]
[im 43/127  soft-tissue]
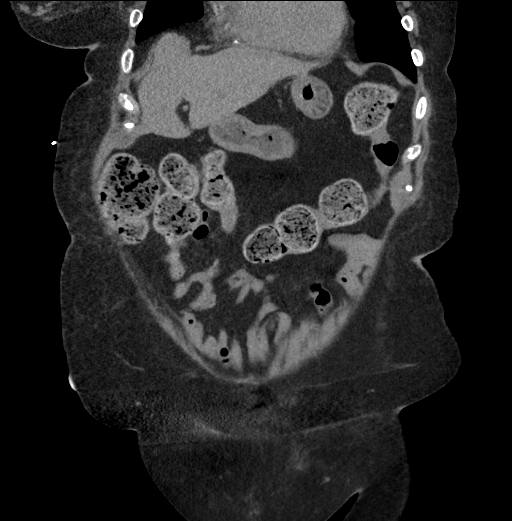
[im 57/127  soft-tissue]
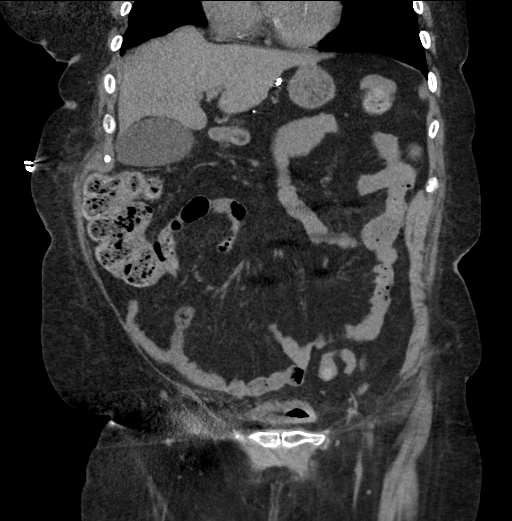
[im 71/127  soft-tissue]
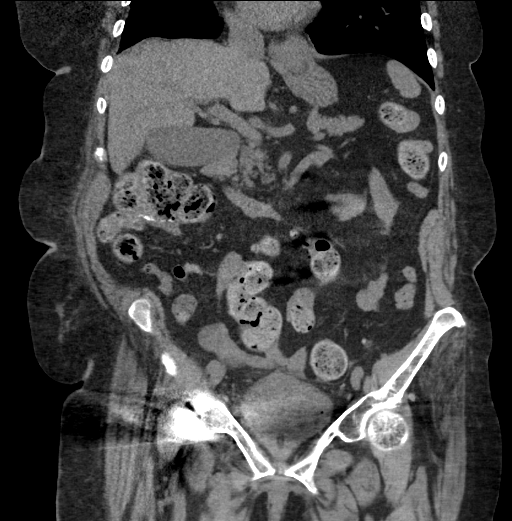

[17 of 46 positions shown; findings below may reference images not displayed]

FINDINGS: Lower chest: Mild bilateral posterior basilar subsegmental
atelectasis is noted.

Hepatobiliary: No focal liver abnormality is seen. No gallstones,
gallbladder wall thickening, or biliary dilatation.

Pancreas: Unremarkable. No pancreatic ductal dilatation or
surrounding inflammatory changes.

Spleen: Normal in size without focal abnormality.

Adrenals/Urinary Tract: Adrenal glands appear normal. No
hydronephrosis or renal obstruction is noted. No renal or ureteral
calculi are noted. Urinary bladder is decompressed secondary to
Foley catheter.

Stomach/Bowel: Large amount of stool is noted throughout the colon
and rectum concerning for impaction. No small bowel dilatation is
noted.

Vascular/Lymphatic: Aortic atherosclerosis. No enlarged abdominal or
pelvic lymph nodes.

Reproductive: Status post hysterectomy. No adnexal masses.

Other: No abdominal wall hernia or abnormality. No abdominopelvic
ascites.

Musculoskeletal: Status post right hip arthroplasty. Status post
surgical fusion of lower lumbar spine.
IMPRESSION: Aortic atherosclerosis.

Large amount of stool seen throughout the colon and rectum
concerning for rectal impaction.

No other significant abnormality seen in the abdomen or pelvis.

## 2017-06-12 ENCOUNTER — Ambulatory Visit (INDEPENDENT_AMBULATORY_CARE_PROVIDER_SITE_OTHER): Payer: Medicare Other | Admitting: Specialist

## 2017-06-12 ENCOUNTER — Encounter (INDEPENDENT_AMBULATORY_CARE_PROVIDER_SITE_OTHER): Payer: Self-pay | Admitting: Specialist

## 2017-06-12 VITALS — BP 98/41 | HR 60 | Ht 60.0 in | Wt 160.0 lb

## 2017-06-12 DIAGNOSIS — M4724 Other spondylosis with radiculopathy, thoracic region: Secondary | ICD-10-CM

## 2017-06-12 DIAGNOSIS — M792 Neuralgia and neuritis, unspecified: Secondary | ICD-10-CM | POA: Diagnosis not present

## 2017-06-12 NOTE — Patient Instructions (Signed)
Avoid bending, stooping and avoid lifting weights greater than 10 lbs. Avoid prolong standing and walking. Avoid frequent bending and stooping  No lifting greater than 10 lbs. May use ice or moist heat for pain. Weight loss is of benefit. Handicap license is approved.  

## 2017-06-12 NOTE — Progress Notes (Signed)
Office Visit Note   Patient: Gloria Best           Date of Birth: January 22, 1941           MRN: [id number] Visit Date: 06/12/2017              Requested by: Marco Collie, MD [address] [place], [place] [address] PCP: Marco Collie, MD   Assessment & Plan: Visit Diagnoses:  1. Thoracic neuralgia   2. Other spondylosis with radiculopathy, thoracic region     Plan: Avoid bending, stooping and avoid lifting weights greater than 10 lbs. Avoid prolong standing and walking. Avoid frequent bending and stooping  No lifting greater than 10 lbs. May use ice or moist heat for pain. Weight loss is of benefit. Handicap license is approved.  Follow-Up Instructions: No Follow-up on file.   Orders:  No orders of the defined types were placed in this encounter.  No orders of the defined types were placed in this encounter.     Procedures: No procedures performed   Clinical Data: No additional findings.   Subjective: Chief Complaint  Patient presents with  . Right Knee - Follow-up    76 year old female 6 months following right TKR, the knee is doing well. Her main concern today is left lower thoracic rib cage pain. It is a catching pain when she twists or turns certain ways. In bed rotating to the side or moving to get up she has a real sharp pain     Review of Systems  Constitutional: Negative.   HENT: Negative.   Eyes: Negative.   Respiratory: Negative.   Cardiovascular: Negative.   Gastrointestinal: Negative.   Endocrine: Negative.   Genitourinary: Negative.   Musculoskeletal: Negative.   Skin: Negative.   Allergic/Immunologic: Negative.   Neurological: Negative.   Hematological: Negative.   Psychiatric/Behavioral: Negative.      Objective: Vital Signs: BP (!) 98/41 (BP Location: Left Arm, Patient Position: Sitting)   Pulse 60   Ht 5' (1.524 m)   Wt 160 lb (72.6 kg)   BMI 31.25 kg/m   Physical Exam  Constitutional: She is oriented to person,  place, and time. She appears well-developed and well-nourished.  HENT:  Head: Normocephalic and atraumatic.  Eyes: Pupils are equal, round, and reactive to light. EOM are normal.  Neck: Normal range of motion. Neck supple.  Pulmonary/Chest: Effort normal and breath sounds normal.  Abdominal: Soft. Bowel sounds are normal.  Neurological: She is alert and oriented to person, place, and time.  Skin: Skin is warm and dry.  Psychiatric: She has a normal mood and affect. Her behavior is normal. Judgment and thought content normal.    Back Exam   Tenderness  The patient is experiencing tenderness in the thoracic.  Range of Motion  Extension: abnormal  Flexion: abnormal  Lateral Bend Left: abnormal   Muscle Strength  Right Quadriceps:  5/5  Left Quadriceps:  5/5  Right Hamstrings:  5/5  Left Hamstrings:  5/5   Reflexes  Patellar: normal Achilles: normal Babinski's sign: normal   Comments:  Pain is along the left lower rib margin T8-T12.      Specialty Comments:  No specialty comments available.  Imaging: No results found.   PMFS History: Patient Active Problem List   Diagnosis Date Noted  . Postoperative urinary retention 12/13/2016    Priority: High    Class: Acute  . Unilateral primary osteoarthritis, right knee 12/10/2016  Priority: High    Class: Chronic  . AVN (avascular necrosis of bone) ([place]) 06/09/2012    Priority: High    Class: Chronic  . Osteoarthritis of right hip 06/09/2012    Priority: High    Class: Chronic  . Type 2 diabetes mellitus with polyneuropathy ([place]) 12/13/2016    Priority: Medium    Class: Chronic  . Postoperative anemia due to acute blood loss 06/12/2012    Priority: Medium    Class: Acute  . Protein-calorie malnutrition, severe 12/26/2016  . Constipated   . Altered mental status   . Acute renal failure (ARF) ([place])   . Hematemesis with nausea   . Elevated WBC count 12/21/2016  . At risk for adverse drug event 12/14/2016  .  Tricompartment osteoarthritis of right knee 12/10/2016   Past Medical History:  Diagnosis Date  . Anemia    hx of blood transfusion  . Anxiety   . Arthritis    RA  . Asthma    hx of as child  . Bladder disorder    having difficulty voiding, requires in and out catheter 2x day and prn; denied current need for I&O cath 08/27/16  . Bronchitis    hx of  . Carpal tunnel syndrome of left wrist    hx of  . Chronic back pain   . Chronic kidney disease   . Depression    takes medication for   . Diabetes mellitus    oral medication  . Dysrhythmia    takes cardizem, sees Dr. Marco Collie primary  . Fibromyalgia    takes lyrica  . GERD (gastroesophageal reflux disease)   . H/O hiatal hernia    hx of   . Heart murmur   . Hypertension   . Pneumonia    hx of  . Sleep apnea    no CPAP. last sleep study over 10 years ago  . Urinary tract infection    hx of  . Wears dentures    top    Family History  Problem Relation Age of Onset  . Diabetes Mother   . Diabetes Father   . Diabetes Sister   . Diabetes Brother   . Heart disease Neg Hx   . Stroke Neg Hx   . Cancer Neg Hx     Past Surgical History:  Procedure Laterality Date  . ABDOMINAL HYSTERECTOMY    . APPENDECTOMY    . BACK SURGERY      x 3  . CARPAL TUNNEL RELEASE     left side only  . CATARACT EXTRACTION     bilateral  . CESAREAN SECTION    . COLON SURGERY     "due to large polyp"  . COLONOSCOPY    . ESOPHAGOGASTRODUODENOSCOPY (EGD) WITH PROPOFOL N/A 12/26/2016   Procedure: ESOPHAGOGASTRODUODENOSCOPY (EGD) WITH PROPOFOL;  Surgeon: Doran Stabler, MD;  Location: [facility];  Service: Endoscopy;  Laterality: N/A;  . FOOT SURGERY    . HERNIA REPAIR    . JOINT REPLACEMENT    . SPINE SURGERY    . TENOLYSIS Left 11/12/2013   Procedure: LEFT TENDON GRAFT RECONSTRUCTION OF LEFT LONG FINGER SAGITTAL FIBERS CORD RELEASE;  Surgeon: Cammie Sickle., MD;  Location: [facility];  Service: Orthopedics;   Laterality: Left;  . TOTAL HIP ARTHROPLASTY  06/09/2012   Procedure: TOTAL HIP ARTHROPLASTY;  Surgeon: [name], MD;  Location: [facility];  Service: Orthopedics;  Laterality: Right;  Right total hip replacement  .  TOTAL KNEE ARTHROPLASTY Right 12/10/2016   Procedure: RIGHT TOTAL KNEE ARTHROPLASTY;  Surgeon: [name], MD;  Location: [facility];  Service: Orthopedics;  Laterality: Right;   Social History   Occupational History  . Not on file.   Social History Main Topics  . Smoking status: Former Smoker    Quit date: 11/05/1984  . Smokeless tobacco: Never Used     Comment: stopped smoking 1983  . Alcohol use No  . Drug use: No  . Sexual activity: No

## 2017-06-17 DIAGNOSIS — D649 Anemia, unspecified: Secondary | ICD-10-CM | POA: Diagnosis not present

## 2017-06-18 DIAGNOSIS — D649 Anemia, unspecified: Secondary | ICD-10-CM | POA: Diagnosis not present

## 2017-06-19 ENCOUNTER — Ambulatory Visit (INDEPENDENT_AMBULATORY_CARE_PROVIDER_SITE_OTHER): Payer: Medicare Other | Admitting: Specialist

## 2017-06-25 DIAGNOSIS — Z6831 Body mass index (BMI) 31.0-31.9, adult: Secondary | ICD-10-CM | POA: Diagnosis not present

## 2017-06-25 DIAGNOSIS — I5032 Chronic diastolic (congestive) heart failure: Secondary | ICD-10-CM | POA: Diagnosis not present

## 2017-06-25 DIAGNOSIS — Z7189 Other specified counseling: Secondary | ICD-10-CM | POA: Diagnosis not present

## 2017-06-27 DIAGNOSIS — K648 Other hemorrhoids: Secondary | ICD-10-CM | POA: Diagnosis not present

## 2017-06-27 DIAGNOSIS — K219 Gastro-esophageal reflux disease without esophagitis: Secondary | ICD-10-CM | POA: Diagnosis not present

## 2017-06-27 DIAGNOSIS — D500 Iron deficiency anemia secondary to blood loss (chronic): Secondary | ICD-10-CM | POA: Diagnosis not present

## 2017-06-27 DIAGNOSIS — K5730 Diverticulosis of large intestine without perforation or abscess without bleeding: Secondary | ICD-10-CM | POA: Diagnosis not present

## 2017-07-01 DIAGNOSIS — M069 Rheumatoid arthritis, unspecified: Secondary | ICD-10-CM | POA: Diagnosis not present

## 2017-07-01 DIAGNOSIS — I509 Heart failure, unspecified: Secondary | ICD-10-CM | POA: Diagnosis not present

## 2017-07-01 DIAGNOSIS — D631 Anemia in chronic kidney disease: Secondary | ICD-10-CM | POA: Diagnosis not present

## 2017-07-01 DIAGNOSIS — D638 Anemia in other chronic diseases classified elsewhere: Secondary | ICD-10-CM | POA: Diagnosis not present

## 2017-07-01 DIAGNOSIS — N189 Chronic kidney disease, unspecified: Secondary | ICD-10-CM | POA: Diagnosis not present

## 2017-07-02 DIAGNOSIS — Z6831 Body mass index (BMI) 31.0-31.9, adult: Secondary | ICD-10-CM | POA: Diagnosis not present

## 2017-07-02 DIAGNOSIS — I509 Heart failure, unspecified: Secondary | ICD-10-CM | POA: Diagnosis not present

## 2017-07-15 DIAGNOSIS — M0589 Other rheumatoid arthritis with rheumatoid factor of multiple sites: Secondary | ICD-10-CM | POA: Diagnosis not present

## 2017-07-15 DIAGNOSIS — Z79899 Other long term (current) drug therapy: Secondary | ICD-10-CM | POA: Diagnosis not present

## 2017-07-17 DIAGNOSIS — N390 Urinary tract infection, site not specified: Secondary | ICD-10-CM | POA: Diagnosis not present

## 2017-07-17 DIAGNOSIS — D509 Iron deficiency anemia, unspecified: Secondary | ICD-10-CM | POA: Diagnosis not present

## 2017-07-17 DIAGNOSIS — N183 Chronic kidney disease, stage 3 (moderate): Secondary | ICD-10-CM | POA: Diagnosis not present

## 2017-07-17 DIAGNOSIS — N2581 Secondary hyperparathyroidism of renal origin: Secondary | ICD-10-CM | POA: Diagnosis not present

## 2017-07-17 DIAGNOSIS — I10 Essential (primary) hypertension: Secondary | ICD-10-CM | POA: Diagnosis not present

## 2017-07-18 DIAGNOSIS — D638 Anemia in other chronic diseases classified elsewhere: Secondary | ICD-10-CM | POA: Diagnosis not present

## 2017-07-18 DIAGNOSIS — Z23 Encounter for immunization: Secondary | ICD-10-CM | POA: Diagnosis not present

## 2017-07-18 DIAGNOSIS — E669 Obesity, unspecified: Secondary | ICD-10-CM | POA: Diagnosis not present

## 2017-07-18 DIAGNOSIS — I509 Heart failure, unspecified: Secondary | ICD-10-CM | POA: Diagnosis not present

## 2017-07-23 DIAGNOSIS — I509 Heart failure, unspecified: Secondary | ICD-10-CM | POA: Diagnosis not present

## 2017-07-23 DIAGNOSIS — Z6830 Body mass index (BMI) 30.0-30.9, adult: Secondary | ICD-10-CM | POA: Diagnosis not present

## 2017-08-01 DIAGNOSIS — M069 Rheumatoid arthritis, unspecified: Secondary | ICD-10-CM | POA: Diagnosis not present

## 2017-08-01 DIAGNOSIS — D631 Anemia in chronic kidney disease: Secondary | ICD-10-CM | POA: Diagnosis not present

## 2017-08-01 DIAGNOSIS — N189 Chronic kidney disease, unspecified: Secondary | ICD-10-CM | POA: Diagnosis not present

## 2017-08-01 DIAGNOSIS — I509 Heart failure, unspecified: Secondary | ICD-10-CM | POA: Diagnosis not present

## 2017-08-01 DIAGNOSIS — D638 Anemia in other chronic diseases classified elsewhere: Secondary | ICD-10-CM | POA: Diagnosis not present

## 2017-08-09 DIAGNOSIS — E1169 Type 2 diabetes mellitus with other specified complication: Secondary | ICD-10-CM | POA: Diagnosis not present

## 2017-08-09 DIAGNOSIS — E1149 Type 2 diabetes mellitus with other diabetic neurological complication: Secondary | ICD-10-CM | POA: Diagnosis not present

## 2017-08-09 DIAGNOSIS — M810 Age-related osteoporosis without current pathological fracture: Secondary | ICD-10-CM | POA: Diagnosis not present

## 2017-08-09 DIAGNOSIS — D519 Vitamin B12 deficiency anemia, unspecified: Secondary | ICD-10-CM | POA: Diagnosis not present

## 2017-08-09 DIAGNOSIS — E1122 Type 2 diabetes mellitus with diabetic chronic kidney disease: Secondary | ICD-10-CM | POA: Diagnosis not present

## 2017-08-12 DIAGNOSIS — M0589 Other rheumatoid arthritis with rheumatoid factor of multiple sites: Secondary | ICD-10-CM | POA: Diagnosis not present

## 2017-08-19 DIAGNOSIS — D631 Anemia in chronic kidney disease: Secondary | ICD-10-CM | POA: Diagnosis not present

## 2017-08-19 DIAGNOSIS — E1122 Type 2 diabetes mellitus with diabetic chronic kidney disease: Secondary | ICD-10-CM | POA: Diagnosis not present

## 2017-08-19 DIAGNOSIS — E1169 Type 2 diabetes mellitus with other specified complication: Secondary | ICD-10-CM | POA: Diagnosis not present

## 2017-08-19 DIAGNOSIS — E782 Mixed hyperlipidemia: Secondary | ICD-10-CM | POA: Diagnosis not present

## 2017-08-19 DIAGNOSIS — I129 Hypertensive chronic kidney disease with stage 1 through stage 4 chronic kidney disease, or unspecified chronic kidney disease: Secondary | ICD-10-CM | POA: Diagnosis not present

## 2017-08-19 DIAGNOSIS — N189 Chronic kidney disease, unspecified: Secondary | ICD-10-CM | POA: Diagnosis not present

## 2017-08-20 DIAGNOSIS — Z23 Encounter for immunization: Secondary | ICD-10-CM | POA: Diagnosis not present

## 2017-08-22 ENCOUNTER — Inpatient Hospital Stay
Admission: AD | Admit: 2017-08-22 | Payer: Self-pay | Source: Other Acute Inpatient Hospital | Admitting: Family Medicine

## 2017-08-22 DIAGNOSIS — R110 Nausea: Secondary | ICD-10-CM | POA: Diagnosis not present

## 2017-08-22 DIAGNOSIS — M069 Rheumatoid arthritis, unspecified: Secondary | ICD-10-CM | POA: Diagnosis present

## 2017-08-22 DIAGNOSIS — I132 Hypertensive heart and chronic kidney disease with heart failure and with stage 5 chronic kidney disease, or end stage renal disease: Secondary | ICD-10-CM | POA: Diagnosis not present

## 2017-08-22 DIAGNOSIS — E860 Dehydration: Secondary | ICD-10-CM | POA: Diagnosis present

## 2017-08-22 DIAGNOSIS — J449 Chronic obstructive pulmonary disease, unspecified: Secondary | ICD-10-CM | POA: Diagnosis present

## 2017-08-22 DIAGNOSIS — Z79899 Other long term (current) drug therapy: Secondary | ICD-10-CM | POA: Diagnosis not present

## 2017-08-22 DIAGNOSIS — J9600 Acute respiratory failure, unspecified whether with hypoxia or hypercapnia: Secondary | ICD-10-CM | POA: Diagnosis not present

## 2017-08-22 DIAGNOSIS — N289 Disorder of kidney and ureter, unspecified: Secondary | ICD-10-CM | POA: Diagnosis not present

## 2017-08-22 DIAGNOSIS — E876 Hypokalemia: Secondary | ICD-10-CM | POA: Diagnosis present

## 2017-08-22 DIAGNOSIS — R6520 Severe sepsis without septic shock: Secondary | ICD-10-CM | POA: Diagnosis not present

## 2017-08-22 DIAGNOSIS — Z888 Allergy status to other drugs, medicaments and biological substances status: Secondary | ICD-10-CM | POA: Diagnosis not present

## 2017-08-22 DIAGNOSIS — K922 Gastrointestinal hemorrhage, unspecified: Secondary | ICD-10-CM | POA: Diagnosis not present

## 2017-08-22 DIAGNOSIS — R0602 Shortness of breath: Secondary | ICD-10-CM | POA: Diagnosis not present

## 2017-08-22 DIAGNOSIS — N185 Chronic kidney disease, stage 5: Secondary | ICD-10-CM | POA: Diagnosis not present

## 2017-08-22 DIAGNOSIS — I2510 Atherosclerotic heart disease of native coronary artery without angina pectoris: Secondary | ICD-10-CM | POA: Diagnosis present

## 2017-08-22 DIAGNOSIS — Z452 Encounter for adjustment and management of vascular access device: Secondary | ICD-10-CM | POA: Diagnosis not present

## 2017-08-22 DIAGNOSIS — K219 Gastro-esophageal reflux disease without esophagitis: Secondary | ICD-10-CM | POA: Diagnosis present

## 2017-08-22 DIAGNOSIS — I214 Non-ST elevation (NSTEMI) myocardial infarction: Secondary | ICD-10-CM | POA: Diagnosis not present

## 2017-08-22 DIAGNOSIS — I248 Other forms of acute ischemic heart disease: Secondary | ICD-10-CM | POA: Diagnosis not present

## 2017-08-22 DIAGNOSIS — N390 Urinary tract infection, site not specified: Secondary | ICD-10-CM | POA: Diagnosis not present

## 2017-08-22 DIAGNOSIS — E7800 Pure hypercholesterolemia, unspecified: Secondary | ICD-10-CM | POA: Diagnosis present

## 2017-08-22 DIAGNOSIS — K6289 Other specified diseases of anus and rectum: Secondary | ICD-10-CM | POA: Diagnosis not present

## 2017-08-22 DIAGNOSIS — R109 Unspecified abdominal pain: Secondary | ICD-10-CM | POA: Diagnosis not present

## 2017-08-22 DIAGNOSIS — R112 Nausea with vomiting, unspecified: Secondary | ICD-10-CM | POA: Diagnosis not present

## 2017-08-22 DIAGNOSIS — A419 Sepsis, unspecified organism: Secondary | ICD-10-CM | POA: Diagnosis not present

## 2017-08-22 DIAGNOSIS — R1084 Generalized abdominal pain: Secondary | ICD-10-CM | POA: Diagnosis not present

## 2017-08-22 DIAGNOSIS — N329 Bladder disorder, unspecified: Secondary | ICD-10-CM | POA: Diagnosis present

## 2017-08-22 DIAGNOSIS — I160 Hypertensive urgency: Secondary | ICD-10-CM | POA: Diagnosis not present

## 2017-08-22 DIAGNOSIS — R1013 Epigastric pain: Secondary | ICD-10-CM | POA: Diagnosis not present

## 2017-08-22 DIAGNOSIS — E872 Acidosis: Secondary | ICD-10-CM | POA: Diagnosis present

## 2017-08-22 DIAGNOSIS — Z91040 Latex allergy status: Secondary | ICD-10-CM | POA: Diagnosis not present

## 2017-08-22 DIAGNOSIS — K559 Vascular disorder of intestine, unspecified: Secondary | ICD-10-CM | POA: Diagnosis not present

## 2017-08-22 DIAGNOSIS — Z87891 Personal history of nicotine dependence: Secondary | ICD-10-CM | POA: Diagnosis not present

## 2017-08-22 DIAGNOSIS — Z87440 Personal history of urinary (tract) infections: Secondary | ICD-10-CM | POA: Diagnosis not present

## 2017-08-22 DIAGNOSIS — I509 Heart failure, unspecified: Secondary | ICD-10-CM | POA: Diagnosis present

## 2017-08-22 DIAGNOSIS — I480 Paroxysmal atrial fibrillation: Secondary | ICD-10-CM | POA: Diagnosis present

## 2017-08-23 DIAGNOSIS — R0602 Shortness of breath: Secondary | ICD-10-CM

## 2017-08-27 ENCOUNTER — Encounter: Payer: Self-pay | Admitting: *Deleted

## 2017-08-27 ENCOUNTER — Other Ambulatory Visit: Payer: Self-pay | Admitting: *Deleted

## 2017-08-28 DIAGNOSIS — Z7982 Long term (current) use of aspirin: Secondary | ICD-10-CM | POA: Diagnosis not present

## 2017-08-28 DIAGNOSIS — D631 Anemia in chronic kidney disease: Secondary | ICD-10-CM | POA: Diagnosis not present

## 2017-08-28 DIAGNOSIS — E785 Hyperlipidemia, unspecified: Secondary | ICD-10-CM | POA: Diagnosis not present

## 2017-08-28 DIAGNOSIS — N390 Urinary tract infection, site not specified: Secondary | ICD-10-CM | POA: Diagnosis not present

## 2017-08-28 DIAGNOSIS — I130 Hypertensive heart and chronic kidney disease with heart failure and stage 1 through stage 4 chronic kidney disease, or unspecified chronic kidney disease: Secondary | ICD-10-CM | POA: Diagnosis not present

## 2017-08-28 DIAGNOSIS — N184 Chronic kidney disease, stage 4 (severe): Secondary | ICD-10-CM | POA: Diagnosis not present

## 2017-08-28 DIAGNOSIS — I509 Heart failure, unspecified: Secondary | ICD-10-CM | POA: Diagnosis not present

## 2017-08-28 DIAGNOSIS — D414 Neoplasm of uncertain behavior of bladder: Secondary | ICD-10-CM | POA: Diagnosis not present

## 2017-08-28 DIAGNOSIS — I480 Paroxysmal atrial fibrillation: Secondary | ICD-10-CM | POA: Diagnosis not present

## 2017-08-28 DIAGNOSIS — Z9181 History of falling: Secondary | ICD-10-CM | POA: Diagnosis not present

## 2017-08-28 NOTE — Progress Notes (Signed)
Cardiology Office Note:    Date:  08/30/2017   ID:  Gloria Best, DOB 01/30/1941, MRN [id number]  PCP:  Marco Collie, MD  Cardiologist:  Shirlee More, MD   Referring MD: Marco Collie, MD  ASSESSMENT:    1. Elevated troponin   2. Hypertensive heart disease, unspecified whether heart failure present   3. PAF (paroxysmal atrial fibrillation) ([place])   4. Nonrheumatic aortic valve stenosis   5. Chronic anticoagulation    PLAN:    In order of problems listed above:  1. Despite a hospital discharge diagnosis of myocardial infarction, troponin was related to renal insufficiency and demand ischemia.  She had no EKG changes of ischemia or infarction and normal left ventricular systolic function by echo.  Her troponin release was flat without the typical rise and fall of acute cardiac injury.  At this time I would not pursue further cardiac diagnostic testing.  Both the patient and her grandson are comfortable with this approach. 2. Stable continue current antihypertensive medications 3. Stable at this time I would not reintroduce an anticoagulant and I asked her to stop aspirin with recent GI bleed.  In any event it is an effective in stroke prophylaxis. 4. Stable on recent echocardiogram 5. No longer anticoagulated due to GI bleed  Next appointment 4 weeks with Dr. Salley Scarlet   Medication Adjustments/Labs and Tests Ordered: Current medicines are reviewed at length with the patient today.  Concerns regarding medicines are outlined above.  Orders Placed This Encounter  Procedures  . EKG 12-Lead   No orders of the defined types were placed in this encounter.    Chief Complaint  Patient presents with  . Follow-up    Hospital,elevated troponin     History of Present Illness:    Gloria Best is a 76 y.o. female with PAF, hypertension, CKD who is being seen today for the evaluation of elevated troponin during recent [facility] admission with abdominal pain, GI bleed,  intractable  nausea, vomitting and hypotension at the request of Marco Collie, MD. She had a MPI done in my previous practice in December 2018  results are requested today. . Prior to this admission coagulant was discontinued due to GI bleeding. At admission to the hospital she had no chest pain no objective criteria of myocardial infarction, bone and was related to renal insufficiency and demand ischemia.  She tells me that her myocardial perfusion study is called to her as normal.  Shortness of breath palpitation syncope or TIA.  She is diffusely weak but feels better day today has no abdominal pain is able to ambulate.  Tomorrow with her PCP including lab work.   Past Medical History:  Diagnosis Date  . Acquired hallux valgus 02/21/2017  . Acute renal failure (ARF) ([place])   . Altered mental status   . Anemia    hx of blood transfusion  . Anxiety   . Arthritis    RA  . Asthma    hx of as child  . At risk for adverse drug event 12/14/2016   12/11/16 markedly somnulent post operative day 1  RIOSORD (Risk Index for Overdose or Serious Opioid -induced Respiratory Depression Risk ) is a tool which determines that risk over the  ensuing 6 months . Based on point total for personal medical history and present drug therapies ; the risk : benefit ratio of any drug regimen can be defined . Significant chronic kidney disease...8 Sleep apnea...5 Active prescriptions for: Antidepressant...8; Scoring:0-4 points... 2% risk;5-7...5%;8-9...7%;10-17...15%;18-25...30%;26-41...55%; >42  points...83%. Total points 21 ; 6 month risk 30% of overdose or death   . AVN (avascular necrosis of bone) ([place]) 06/09/2012  . Bladder disorder    having difficulty voiding, requires in and out catheter 2x day and prn; denied current need for I&O cath 08/27/16  . Bronchitis    hx of  . Carpal tunnel syndrome of left wrist    hx of  . Chronic back pain   . Chronic kidney disease   . Constipated   . Depression    takes medication for   .  Diabetes mellitus    oral medication  . Dyslipidemia 04/23/2016  . Dysrhythmia    takes cardizem, sees Dr. Marco Collie primary  . Elevated WBC count 12/21/2016  . Fibromyalgia    takes lyrica  . GERD (gastroesophageal reflux disease)   . H/O hiatal hernia    hx of   . Heart murmur   . Hematemesis with nausea   . History of foot ulcer 02/21/2017  . Hypertension   . Osteoarthritis of right hip 06/09/2012  . Pneumonia    hx of  . Postoperative anemia due to acute blood loss 06/12/2012   Decreased in hemoglobin from 9 to 7.9  . Postoperative urinary retention 12/13/2016   Does have a history of some urinary retention and in and out catheterization briefly in 08/2016.  Marland Kitchen Protein-calorie malnutrition, severe 12/26/2016  . Sleep apnea    no CPAP. last sleep study over 10 years ago  . Tailor's bunion of left foot 11/15/2016  . 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 Type 2 diabetes mellitus with polyneuropathy ([place]) 12/13/2016   12/10/16 Hemoglobin A1c 6%  . Unilateral primary osteoarthritis, right knee 12/10/2016   Right knee severe osteoarthritis, tricompartment.  . Urinary tract infection    hx of  . Wears dentures    top    Past Surgical History:  Procedure Laterality Date  . ABDOMINAL HYSTERECTOMY    . APPENDECTOMY    . BACK SURGERY      x 3  . CARPAL TUNNEL RELEASE     left side only  . CATARACT EXTRACTION     bilateral  . CESAREAN SECTION    . COLON SURGERY     "due to large polyp"  . COLONOSCOPY    . ESOPHAGOGASTRODUODENOSCOPY (EGD) WITH PROPOFOL N/A 12/26/2016   Procedure: ESOPHAGOGASTRODUODENOSCOPY (EGD) WITH PROPOFOL;  Surgeon: Doran Stabler, MD;  Location: [facility] [place];  Service: Endoscopy;  Laterality: N/A;  . FOOT SURGERY    . HERNIA REPAIR    . JOINT REPLACEMENT    . SPINE SURGERY    . TENOLYSIS Left 11/12/2013   Procedure: LEFT TENDON GRAFT RECONSTRUCTION OF LEFT LONG FINGER SAGITTAL FIBERS CORD RELEASE;   Surgeon: Cammie Sickle., MD;  Location: [facility];  Service: Orthopedics;  Laterality: Left;  . TOTAL HIP ARTHROPLASTY  06/09/2012   Procedure: TOTAL HIP ARTHROPLASTY;  Surgeon: [name], MD;  Location: [facility];  Service: Orthopedics;  Laterality: Right;  Right total hip replacement  . TOTAL KNEE ARTHROPLASTY Right 12/10/2016   Procedure: RIGHT TOTAL KNEE ARTHROPLASTY;  Surgeon: [name], MD;  Location: [facility];  Service: Orthopedics;  Laterality: Right;    Current Medications: Current Meds  Medication Sig  . acetaminophen (TYLENOL) 500 MG tablet Take 500 mg by mouth every 8 (eight) hours as needed (for pain).   Marland Kitchen albuterol (  PROVENTIL) (5 MG/ML) 0.5% nebulizer solution Take 2.5 mg by nebulization every 6 (six) hours as needed for wheezing or shortness of breath.  . ARIPiprazole (ABILIFY) 5 MG tablet Take 5 mg by mouth every evening.   Marland Kitchen aspirin EC 81 MG tablet Take 81 mg by mouth daily.  Marland Kitchen atorvastatin (LIPITOR) 80 MG tablet Take 80 mg by mouth daily at 6 PM.   . diltiazem (CARDIZEM CD) 240 MG 24 hr capsule Take 240 mg by mouth daily.  Marland Kitchen escitalopram (LEXAPRO) 10 MG tablet Take 10 mg by mouth daily.  . ferrous sulfate 325 (65 FE) MG tablet Take 1 tablet (325 mg total) by mouth 2 (two) times daily with a meal. (Patient taking differently: Take 325 mg by mouth daily with breakfast. )  . folic acid (FOLVITE) 1 MG tablet Take 1 mg by mouth every evening.   . furosemide (LASIX) 40 MG tablet Take 20 mg by mouth daily.   . hydroxychloroquine (PLAQUENIL) 200 MG tablet Take 200 mg by mouth 2 (two) times daily.   Marland Kitchen inFLIXimab (REMICADE) 100 MG injection Inject into the vein every 30 (thirty) days.  Marland Kitchen ipratropium (ATROVENT HFA) 17 MCG/ACT inhaler Inhale 2 puffs into the lungs every 6 (six) hours.  Marland Kitchen MYRBETRIQ 50 MG TB24 tablet daily.  Marland Kitchen omega-3 acid ethyl esters (LOVAZA) 1 g capsule Take by mouth daily.  . pantoprazole (PROTONIX) 40 MG tablet 2 (two) times daily.  . polyethylene  glycol (MIRALAX / GLYCOLAX) packet Take 17 g by mouth daily.  . potassium chloride SA (K-DUR,KLOR-CON) 20 MEQ tablet Take 20 mEq by mouth 2 (two) times daily.   . pramipexole (MIRAPEX) 0.5 MG tablet Take 0.5 mg by mouth at bedtime.  . pregabalin (LYRICA) 100 MG capsule Take 100 mg by mouth every morning.  . pregabalin (LYRICA) 200 MG capsule Take 200 mg by mouth at bedtime.  . traMADol (ULTRAM) 50 MG tablet Take by mouth every 6 (six) hours as needed.  . trimethoprim (TRIMPEX) 100 MG tablet Take 100 mg by mouth daily.  . Vitamin D, Ergocalciferol, (DRISDOL) 50000 units CAPS capsule Take 50,000 Units by mouth every 30 (thirty) days.  . [DISCONTINUED] aspirin EC 81 MG tablet Take 81 mg by mouth daily.     Allergies:   Adhesive [tape]; Latex; and Lotrel [amlodipine besy-benazepril hcl]   Social History   Social History  . Marital status: Widowed    Spouse name: N/A  . Number of children: N/A  . Years of education: N/A   Social History Main Topics  . Smoking status: Former Smoker    Quit date: 11/05/1984  . Smokeless tobacco: Never Used     Comment: stopped smoking 1983  . Alcohol use No  . Drug use: No  . Sexual activity: No   Other Topics Concern  . None   Social History Narrative  . None     Family History: The patient's family history includes Diabetes in her brother, father, mother, and sister. There is no history of Heart disease, Stroke, or Cancer.  ROS:   ROS Please see the history of present illness.     All other systems reviewed and are negative.  EKGs/Labs/Other Studies Reviewed:    The following studies were reviewed today: [facility] records including ED admission discharge x-ray lab and EKGs were reviewed prior to the office visit.  EKG:  EKG is  ordered today.  The ekg ordered today demonstrates Reed normal  Echo 08/23/17 , EF 55-60%, mild AS,  mild AR, mild MR and mild TR  Recent Labs: troponin 0.48/0.51/0.59, Cr 2.2 , GFR 22 cc/min, BNP  13000  12/22/2016: TSH 1.224 12/24/2016: Magnesium 2.5 12/25/2016: ALT 11 12/26/2016: BUN 15; Creatinine, Ser 1.33; Potassium 3.6; Sodium 132 12/27/2016: Hemoglobin 9.1; Platelets 486   Recent Lipid Panel No results found for: CHOL, TRIG, HDL, CHOLHDL, VLDL, LDLCALC, LDLDIRECT  Physical Exam:    VS:  BP 120/60 (BP Location: Right Arm, Patient Position: Sitting, Cuff Size: Normal)   Pulse (!) 56   Ht 5' (1.524 m)   Wt 155 lb (70.3 kg)   SpO2 93%   BMI 30.27 kg/m     Wt Readings from Last 3 Encounters:  08/29/17 155 lb (70.3 kg)  06/12/17 160 lb (72.6 kg)  02/14/17 160 lb (72.6 kg)     GEN: She is pallor of the skin and membranes is quite frail in no acute distress HEENT: Normal NECK: No JVD; No carotid bruits LYMPHATICS: No lymphadenopathy CARDIAC: RRR, no murmurs, rubs, gallops RESPIRATORY:  Clear to auscultation without rales, wheezing or rhonchi  ABDOMEN: Soft, non-tender, non-distended MUSCULOSKELETAL:  No edema; No deformity  SKIN: Warm and dry NEUROLOGIC:  Alert and oriented x 3 PSYCHIATRIC:  Normal affect     Signed, Shirlee More, MD  08/30/2017 7:58 AM    [place]

## 2017-08-29 ENCOUNTER — Encounter: Payer: Self-pay | Admitting: Cardiology

## 2017-08-29 ENCOUNTER — Ambulatory Visit (INDEPENDENT_AMBULATORY_CARE_PROVIDER_SITE_OTHER): Payer: Medicare Other | Admitting: Cardiology

## 2017-08-29 VITALS — BP 120/60 | HR 56 | Ht 60.0 in | Wt 155.0 lb

## 2017-08-29 DIAGNOSIS — R748 Abnormal levels of other serum enzymes: Secondary | ICD-10-CM | POA: Diagnosis not present

## 2017-08-29 DIAGNOSIS — I350 Nonrheumatic aortic (valve) stenosis: Secondary | ICD-10-CM

## 2017-08-29 DIAGNOSIS — Z7901 Long term (current) use of anticoagulants: Secondary | ICD-10-CM

## 2017-08-29 DIAGNOSIS — I119 Hypertensive heart disease without heart failure: Secondary | ICD-10-CM | POA: Diagnosis not present

## 2017-08-29 DIAGNOSIS — R7989 Other specified abnormal findings of blood chemistry: Principal | ICD-10-CM

## 2017-08-29 DIAGNOSIS — R778 Other specified abnormalities of plasma proteins: Secondary | ICD-10-CM

## 2017-08-29 DIAGNOSIS — I480 Paroxysmal atrial fibrillation: Secondary | ICD-10-CM

## 2017-08-29 NOTE — Patient Instructions (Signed)
Medication Instructions:  Your physician has recommended you make the following change in your medication:  STOP aspirin  Labwork: None  Testing/Procedures: None  Follow-Up: Your physician recommends that you schedule a follow-up appointment in: 4 weeks with Dr. Geraldo Pitter.  Any Other Special Instructions Will Be Listed Below (If Applicable).     If you need a refill on your cardiac medications before your next appointment, please call your pharmacy.

## 2017-08-30 ENCOUNTER — Encounter: Payer: Self-pay | Admitting: Cardiology

## 2017-08-30 DIAGNOSIS — N184 Chronic kidney disease, stage 4 (severe): Secondary | ICD-10-CM | POA: Diagnosis not present

## 2017-08-30 DIAGNOSIS — N390 Urinary tract infection, site not specified: Secondary | ICD-10-CM | POA: Diagnosis not present

## 2017-08-30 DIAGNOSIS — E538 Deficiency of other specified B group vitamins: Secondary | ICD-10-CM | POA: Diagnosis not present

## 2017-08-30 DIAGNOSIS — D414 Neoplasm of uncertain behavior of bladder: Secondary | ICD-10-CM | POA: Diagnosis not present

## 2017-08-30 DIAGNOSIS — D631 Anemia in chronic kidney disease: Secondary | ICD-10-CM | POA: Diagnosis not present

## 2017-08-30 DIAGNOSIS — N189 Chronic kidney disease, unspecified: Secondary | ICD-10-CM | POA: Diagnosis not present

## 2017-08-30 DIAGNOSIS — I480 Paroxysmal atrial fibrillation: Secondary | ICD-10-CM | POA: Diagnosis not present

## 2017-08-30 DIAGNOSIS — I130 Hypertensive heart and chronic kidney disease with heart failure and stage 1 through stage 4 chronic kidney disease, or unspecified chronic kidney disease: Secondary | ICD-10-CM | POA: Diagnosis not present

## 2017-08-30 DIAGNOSIS — D509 Iron deficiency anemia, unspecified: Secondary | ICD-10-CM | POA: Diagnosis not present

## 2017-08-30 DIAGNOSIS — D638 Anemia in other chronic diseases classified elsewhere: Secondary | ICD-10-CM | POA: Diagnosis not present

## 2017-08-30 DIAGNOSIS — I509 Heart failure, unspecified: Secondary | ICD-10-CM | POA: Diagnosis not present

## 2017-08-30 DIAGNOSIS — E119 Type 2 diabetes mellitus without complications: Secondary | ICD-10-CM | POA: Diagnosis not present

## 2017-09-02 DIAGNOSIS — Z6831 Body mass index (BMI) 31.0-31.9, adult: Secondary | ICD-10-CM | POA: Diagnosis not present

## 2017-09-02 DIAGNOSIS — N3289 Other specified disorders of bladder: Secondary | ICD-10-CM | POA: Diagnosis not present

## 2017-09-02 DIAGNOSIS — R197 Diarrhea, unspecified: Secondary | ICD-10-CM | POA: Diagnosis not present

## 2017-09-02 DIAGNOSIS — R112 Nausea with vomiting, unspecified: Secondary | ICD-10-CM | POA: Diagnosis not present

## 2017-09-03 DIAGNOSIS — I130 Hypertensive heart and chronic kidney disease with heart failure and stage 1 through stage 4 chronic kidney disease, or unspecified chronic kidney disease: Secondary | ICD-10-CM | POA: Diagnosis not present

## 2017-09-03 DIAGNOSIS — N184 Chronic kidney disease, stage 4 (severe): Secondary | ICD-10-CM | POA: Diagnosis not present

## 2017-09-03 DIAGNOSIS — N390 Urinary tract infection, site not specified: Secondary | ICD-10-CM | POA: Diagnosis not present

## 2017-09-03 DIAGNOSIS — D414 Neoplasm of uncertain behavior of bladder: Secondary | ICD-10-CM | POA: Diagnosis not present

## 2017-09-03 DIAGNOSIS — I480 Paroxysmal atrial fibrillation: Secondary | ICD-10-CM | POA: Diagnosis not present

## 2017-09-03 DIAGNOSIS — I509 Heart failure, unspecified: Secondary | ICD-10-CM | POA: Diagnosis not present

## 2017-09-04 DIAGNOSIS — D414 Neoplasm of uncertain behavior of bladder: Secondary | ICD-10-CM | POA: Diagnosis not present

## 2017-09-04 DIAGNOSIS — N390 Urinary tract infection, site not specified: Secondary | ICD-10-CM | POA: Diagnosis not present

## 2017-09-04 DIAGNOSIS — I509 Heart failure, unspecified: Secondary | ICD-10-CM | POA: Diagnosis not present

## 2017-09-04 DIAGNOSIS — N184 Chronic kidney disease, stage 4 (severe): Secondary | ICD-10-CM | POA: Diagnosis not present

## 2017-09-04 DIAGNOSIS — I130 Hypertensive heart and chronic kidney disease with heart failure and stage 1 through stage 4 chronic kidney disease, or unspecified chronic kidney disease: Secondary | ICD-10-CM | POA: Diagnosis not present

## 2017-09-04 DIAGNOSIS — I480 Paroxysmal atrial fibrillation: Secondary | ICD-10-CM | POA: Diagnosis not present

## 2017-09-05 DIAGNOSIS — I509 Heart failure, unspecified: Secondary | ICD-10-CM | POA: Diagnosis not present

## 2017-09-05 DIAGNOSIS — D414 Neoplasm of uncertain behavior of bladder: Secondary | ICD-10-CM | POA: Diagnosis not present

## 2017-09-05 DIAGNOSIS — N390 Urinary tract infection, site not specified: Secondary | ICD-10-CM | POA: Diagnosis not present

## 2017-09-05 DIAGNOSIS — N184 Chronic kidney disease, stage 4 (severe): Secondary | ICD-10-CM | POA: Diagnosis not present

## 2017-09-05 DIAGNOSIS — I130 Hypertensive heart and chronic kidney disease with heart failure and stage 1 through stage 4 chronic kidney disease, or unspecified chronic kidney disease: Secondary | ICD-10-CM | POA: Diagnosis not present

## 2017-09-05 DIAGNOSIS — I480 Paroxysmal atrial fibrillation: Secondary | ICD-10-CM | POA: Diagnosis not present

## 2017-09-06 DIAGNOSIS — I130 Hypertensive heart and chronic kidney disease with heart failure and stage 1 through stage 4 chronic kidney disease, or unspecified chronic kidney disease: Secondary | ICD-10-CM | POA: Diagnosis not present

## 2017-09-06 DIAGNOSIS — I480 Paroxysmal atrial fibrillation: Secondary | ICD-10-CM | POA: Diagnosis not present

## 2017-09-06 DIAGNOSIS — N184 Chronic kidney disease, stage 4 (severe): Secondary | ICD-10-CM | POA: Diagnosis not present

## 2017-09-06 DIAGNOSIS — I509 Heart failure, unspecified: Secondary | ICD-10-CM | POA: Diagnosis not present

## 2017-09-06 DIAGNOSIS — N390 Urinary tract infection, site not specified: Secondary | ICD-10-CM | POA: Diagnosis not present

## 2017-09-06 DIAGNOSIS — D414 Neoplasm of uncertain behavior of bladder: Secondary | ICD-10-CM | POA: Diagnosis not present

## 2017-09-09 DIAGNOSIS — N390 Urinary tract infection, site not specified: Secondary | ICD-10-CM | POA: Diagnosis not present

## 2017-09-09 DIAGNOSIS — I509 Heart failure, unspecified: Secondary | ICD-10-CM | POA: Diagnosis not present

## 2017-09-09 DIAGNOSIS — N184 Chronic kidney disease, stage 4 (severe): Secondary | ICD-10-CM | POA: Diagnosis not present

## 2017-09-09 DIAGNOSIS — I480 Paroxysmal atrial fibrillation: Secondary | ICD-10-CM | POA: Diagnosis not present

## 2017-09-09 DIAGNOSIS — D414 Neoplasm of uncertain behavior of bladder: Secondary | ICD-10-CM | POA: Diagnosis not present

## 2017-09-09 DIAGNOSIS — I130 Hypertensive heart and chronic kidney disease with heart failure and stage 1 through stage 4 chronic kidney disease, or unspecified chronic kidney disease: Secondary | ICD-10-CM | POA: Diagnosis not present

## 2017-09-12 DIAGNOSIS — E1142 Type 2 diabetes mellitus with diabetic polyneuropathy: Secondary | ICD-10-CM | POA: Diagnosis not present

## 2017-09-12 DIAGNOSIS — L84 Corns and callosities: Secondary | ICD-10-CM | POA: Diagnosis not present

## 2017-09-12 DIAGNOSIS — M21622 Bunionette of left foot: Secondary | ICD-10-CM | POA: Diagnosis not present

## 2017-09-13 DIAGNOSIS — I130 Hypertensive heart and chronic kidney disease with heart failure and stage 1 through stage 4 chronic kidney disease, or unspecified chronic kidney disease: Secondary | ICD-10-CM | POA: Diagnosis not present

## 2017-09-13 DIAGNOSIS — N390 Urinary tract infection, site not specified: Secondary | ICD-10-CM | POA: Diagnosis not present

## 2017-09-13 DIAGNOSIS — I509 Heart failure, unspecified: Secondary | ICD-10-CM | POA: Diagnosis not present

## 2017-09-13 DIAGNOSIS — D414 Neoplasm of uncertain behavior of bladder: Secondary | ICD-10-CM | POA: Diagnosis not present

## 2017-09-13 DIAGNOSIS — I480 Paroxysmal atrial fibrillation: Secondary | ICD-10-CM | POA: Diagnosis not present

## 2017-09-13 DIAGNOSIS — N184 Chronic kidney disease, stage 4 (severe): Secondary | ICD-10-CM | POA: Diagnosis not present

## 2017-09-14 DIAGNOSIS — L84 Corns and callosities: Secondary | ICD-10-CM | POA: Insufficient documentation

## 2017-09-17 DIAGNOSIS — N329 Bladder disorder, unspecified: Secondary | ICD-10-CM | POA: Diagnosis not present

## 2017-09-17 DIAGNOSIS — N3281 Overactive bladder: Secondary | ICD-10-CM | POA: Diagnosis not present

## 2017-09-17 DIAGNOSIS — R8271 Bacteriuria: Secondary | ICD-10-CM | POA: Diagnosis not present

## 2017-09-17 DIAGNOSIS — N3946 Mixed incontinence: Secondary | ICD-10-CM | POA: Diagnosis not present

## 2017-09-18 DIAGNOSIS — N390 Urinary tract infection, site not specified: Secondary | ICD-10-CM | POA: Diagnosis not present

## 2017-09-18 DIAGNOSIS — D414 Neoplasm of uncertain behavior of bladder: Secondary | ICD-10-CM | POA: Diagnosis not present

## 2017-09-18 DIAGNOSIS — I130 Hypertensive heart and chronic kidney disease with heart failure and stage 1 through stage 4 chronic kidney disease, or unspecified chronic kidney disease: Secondary | ICD-10-CM | POA: Diagnosis not present

## 2017-09-18 DIAGNOSIS — N184 Chronic kidney disease, stage 4 (severe): Secondary | ICD-10-CM | POA: Diagnosis not present

## 2017-09-18 DIAGNOSIS — I509 Heart failure, unspecified: Secondary | ICD-10-CM | POA: Diagnosis not present

## 2017-09-18 DIAGNOSIS — I480 Paroxysmal atrial fibrillation: Secondary | ICD-10-CM | POA: Diagnosis not present

## 2017-09-20 DIAGNOSIS — I509 Heart failure, unspecified: Secondary | ICD-10-CM | POA: Diagnosis not present

## 2017-09-20 DIAGNOSIS — E669 Obesity, unspecified: Secondary | ICD-10-CM | POA: Diagnosis not present

## 2017-09-20 DIAGNOSIS — D638 Anemia in other chronic diseases classified elsewhere: Secondary | ICD-10-CM | POA: Diagnosis not present

## 2017-09-20 DIAGNOSIS — Z6830 Body mass index (BMI) 30.0-30.9, adult: Secondary | ICD-10-CM | POA: Diagnosis not present

## 2017-09-23 DIAGNOSIS — Z6830 Body mass index (BMI) 30.0-30.9, adult: Secondary | ICD-10-CM | POA: Diagnosis not present

## 2017-09-23 DIAGNOSIS — I509 Heart failure, unspecified: Secondary | ICD-10-CM | POA: Diagnosis not present

## 2017-09-24 DIAGNOSIS — N390 Urinary tract infection, site not specified: Secondary | ICD-10-CM | POA: Diagnosis not present

## 2017-09-24 DIAGNOSIS — I130 Hypertensive heart and chronic kidney disease with heart failure and stage 1 through stage 4 chronic kidney disease, or unspecified chronic kidney disease: Secondary | ICD-10-CM | POA: Diagnosis not present

## 2017-09-24 DIAGNOSIS — I509 Heart failure, unspecified: Secondary | ICD-10-CM | POA: Diagnosis not present

## 2017-09-24 DIAGNOSIS — N184 Chronic kidney disease, stage 4 (severe): Secondary | ICD-10-CM | POA: Diagnosis not present

## 2017-09-24 DIAGNOSIS — I480 Paroxysmal atrial fibrillation: Secondary | ICD-10-CM | POA: Diagnosis not present

## 2017-09-24 DIAGNOSIS — D414 Neoplasm of uncertain behavior of bladder: Secondary | ICD-10-CM | POA: Diagnosis not present

## 2017-09-27 DIAGNOSIS — D631 Anemia in chronic kidney disease: Secondary | ICD-10-CM | POA: Diagnosis not present

## 2017-09-27 DIAGNOSIS — N189 Chronic kidney disease, unspecified: Secondary | ICD-10-CM | POA: Diagnosis not present

## 2017-09-27 DIAGNOSIS — D638 Anemia in other chronic diseases classified elsewhere: Secondary | ICD-10-CM | POA: Diagnosis not present

## 2017-09-27 DIAGNOSIS — I509 Heart failure, unspecified: Secondary | ICD-10-CM | POA: Diagnosis not present

## 2017-09-27 DIAGNOSIS — M069 Rheumatoid arthritis, unspecified: Secondary | ICD-10-CM | POA: Diagnosis not present

## 2017-09-30 ENCOUNTER — Ambulatory Visit: Payer: Medicare Other | Admitting: Cardiology

## 2017-09-30 DIAGNOSIS — Z6830 Body mass index (BMI) 30.0-30.9, adult: Secondary | ICD-10-CM | POA: Diagnosis not present

## 2017-09-30 DIAGNOSIS — R350 Frequency of micturition: Secondary | ICD-10-CM | POA: Diagnosis not present

## 2017-09-30 DIAGNOSIS — I509 Heart failure, unspecified: Secondary | ICD-10-CM | POA: Diagnosis not present

## 2017-10-01 DIAGNOSIS — D414 Neoplasm of uncertain behavior of bladder: Secondary | ICD-10-CM | POA: Diagnosis not present

## 2017-10-01 DIAGNOSIS — N184 Chronic kidney disease, stage 4 (severe): Secondary | ICD-10-CM | POA: Diagnosis not present

## 2017-10-01 DIAGNOSIS — I130 Hypertensive heart and chronic kidney disease with heart failure and stage 1 through stage 4 chronic kidney disease, or unspecified chronic kidney disease: Secondary | ICD-10-CM | POA: Diagnosis not present

## 2017-10-01 DIAGNOSIS — N390 Urinary tract infection, site not specified: Secondary | ICD-10-CM | POA: Diagnosis not present

## 2017-10-01 DIAGNOSIS — I509 Heart failure, unspecified: Secondary | ICD-10-CM | POA: Diagnosis not present

## 2017-10-01 DIAGNOSIS — I480 Paroxysmal atrial fibrillation: Secondary | ICD-10-CM | POA: Diagnosis not present

## 2017-10-09 DIAGNOSIS — M0589 Other rheumatoid arthritis with rheumatoid factor of multiple sites: Secondary | ICD-10-CM | POA: Diagnosis not present

## 2017-10-10 DIAGNOSIS — D414 Neoplasm of uncertain behavior of bladder: Secondary | ICD-10-CM | POA: Diagnosis not present

## 2017-10-10 DIAGNOSIS — N184 Chronic kidney disease, stage 4 (severe): Secondary | ICD-10-CM | POA: Diagnosis not present

## 2017-10-10 DIAGNOSIS — I509 Heart failure, unspecified: Secondary | ICD-10-CM | POA: Diagnosis not present

## 2017-10-10 DIAGNOSIS — N390 Urinary tract infection, site not specified: Secondary | ICD-10-CM | POA: Diagnosis not present

## 2017-10-10 DIAGNOSIS — I480 Paroxysmal atrial fibrillation: Secondary | ICD-10-CM | POA: Diagnosis not present

## 2017-10-10 DIAGNOSIS — I130 Hypertensive heart and chronic kidney disease with heart failure and stage 1 through stage 4 chronic kidney disease, or unspecified chronic kidney disease: Secondary | ICD-10-CM | POA: Diagnosis not present

## 2017-10-24 DIAGNOSIS — I130 Hypertensive heart and chronic kidney disease with heart failure and stage 1 through stage 4 chronic kidney disease, or unspecified chronic kidney disease: Secondary | ICD-10-CM | POA: Diagnosis not present

## 2017-10-24 DIAGNOSIS — N390 Urinary tract infection, site not specified: Secondary | ICD-10-CM | POA: Diagnosis not present

## 2017-10-24 DIAGNOSIS — I509 Heart failure, unspecified: Secondary | ICD-10-CM | POA: Diagnosis not present

## 2017-10-24 DIAGNOSIS — N184 Chronic kidney disease, stage 4 (severe): Secondary | ICD-10-CM | POA: Diagnosis not present

## 2017-10-24 DIAGNOSIS — I480 Paroxysmal atrial fibrillation: Secondary | ICD-10-CM | POA: Diagnosis not present

## 2017-10-24 DIAGNOSIS — D414 Neoplasm of uncertain behavior of bladder: Secondary | ICD-10-CM | POA: Diagnosis not present

## 2017-10-25 DIAGNOSIS — D638 Anemia in other chronic diseases classified elsewhere: Secondary | ICD-10-CM | POA: Diagnosis not present

## 2017-10-25 DIAGNOSIS — N189 Chronic kidney disease, unspecified: Secondary | ICD-10-CM | POA: Diagnosis not present

## 2017-10-25 DIAGNOSIS — M069 Rheumatoid arthritis, unspecified: Secondary | ICD-10-CM | POA: Diagnosis not present

## 2017-10-25 DIAGNOSIS — D631 Anemia in chronic kidney disease: Secondary | ICD-10-CM | POA: Diagnosis not present

## 2017-11-04 DIAGNOSIS — N329 Bladder disorder, unspecified: Secondary | ICD-10-CM | POA: Diagnosis not present

## 2017-11-04 DIAGNOSIS — N3020 Other chronic cystitis without hematuria: Secondary | ICD-10-CM | POA: Diagnosis not present

## 2017-11-07 DIAGNOSIS — M0589 Other rheumatoid arthritis with rheumatoid factor of multiple sites: Secondary | ICD-10-CM | POA: Diagnosis not present

## 2017-11-08 ENCOUNTER — Other Ambulatory Visit: Payer: Self-pay | Admitting: Urology

## 2017-11-12 DIAGNOSIS — E1142 Type 2 diabetes mellitus with diabetic polyneuropathy: Secondary | ICD-10-CM | POA: Diagnosis not present

## 2017-11-12 DIAGNOSIS — S91302A Unspecified open wound, left foot, initial encounter: Secondary | ICD-10-CM | POA: Diagnosis not present

## 2017-11-12 DIAGNOSIS — M21622 Bunionette of left foot: Secondary | ICD-10-CM | POA: Diagnosis not present

## 2017-11-14 DIAGNOSIS — E1169 Type 2 diabetes mellitus with other specified complication: Secondary | ICD-10-CM | POA: Diagnosis not present

## 2017-11-14 DIAGNOSIS — E1149 Type 2 diabetes mellitus with other diabetic neurological complication: Secondary | ICD-10-CM | POA: Diagnosis not present

## 2017-11-14 DIAGNOSIS — E1122 Type 2 diabetes mellitus with diabetic chronic kidney disease: Secondary | ICD-10-CM | POA: Diagnosis not present

## 2017-11-19 DIAGNOSIS — M21622 Bunionette of left foot: Secondary | ICD-10-CM | POA: Diagnosis not present

## 2017-11-19 DIAGNOSIS — M2012 Hallux valgus (acquired), left foot: Secondary | ICD-10-CM | POA: Diagnosis not present

## 2017-11-19 DIAGNOSIS — T85848D Pain due to other internal prosthetic devices, implants and grafts, subsequent encounter: Secondary | ICD-10-CM | POA: Diagnosis not present

## 2017-11-21 DIAGNOSIS — E1169 Type 2 diabetes mellitus with other specified complication: Secondary | ICD-10-CM | POA: Diagnosis not present

## 2017-11-21 DIAGNOSIS — I509 Heart failure, unspecified: Secondary | ICD-10-CM | POA: Diagnosis not present

## 2017-11-21 DIAGNOSIS — E782 Mixed hyperlipidemia: Secondary | ICD-10-CM | POA: Diagnosis not present

## 2017-11-21 DIAGNOSIS — E1122 Type 2 diabetes mellitus with diabetic chronic kidney disease: Secondary | ICD-10-CM | POA: Diagnosis not present

## 2017-11-22 DIAGNOSIS — D631 Anemia in chronic kidney disease: Secondary | ICD-10-CM | POA: Diagnosis not present

## 2017-11-22 DIAGNOSIS — N189 Chronic kidney disease, unspecified: Secondary | ICD-10-CM | POA: Diagnosis not present

## 2017-11-22 NOTE — Progress Notes (Signed)
08-29-17 (Epic) EKG and last visit with cardiology.  01-02-17  (Epic) ECHO  12-22-16 (Epic) CXR

## 2017-11-22 NOTE — Patient Instructions (Addendum)
Gloria Best  11/22/2017   Your procedure is scheduled on: 11-27-17   Report to [facility] Main  Entrance Report to admitting at 6:30 AM   Call this number if you have problems the morning of surgery [phone]   Remember: Do not eat food or drink liquids :After Midnight.     Take these medicines the morning of surgery with A SIP OF WATER: Aripiprazole (Abilify), Carvedilol (Coreg), Diltiazem (Cardizem), Escitalopram (Lexapro), Isosorbide Mononitrate (Imdur), Pantoprazole (Protonix), and Ranitidine (Zantac)                                 You may not have any metal on your body including hair pins and              piercings  Do not wear jewelry, make-up, lotions, powders or perfumes, deodorant             Do not wear nail polish.  Do not shave  48 hours prior to surgery.                Do not bring valuables to the hospital. [place].  Contacts, dentures or bridgework may not be worn into surgery.       Patients discharged the day of surgery will not be allowed to drive home.  Name and phone number of your driver: Gloria Best [phone] or [phone]              Please read over the following fact sheets you were given: _____________________________________________________________________           [facility] - Preparing for Surgery Before surgery, you can play an important role.  Because skin is not sterile, your skin needs to be as free of germs as possible.  You can reduce the number of germs on your skin by washing with CHG (chlorahexidine gluconate) soap before surgery.  CHG is an antiseptic cleaner which kills germs and bonds with the skin to continue killing germs even after washing. Please DO NOT use if you have an allergy to CHG or antibacterial soaps.  If your skin becomes reddened/irritated stop using the CHG and inform your nurse when you arrive at Short Stay. Do not shave (including  legs and underarms) for at least 48 hours prior to the first CHG shower.  You may shave your face/neck. Please follow these instructions carefully:  1.  Shower with CHG Soap the night before surgery and the  morning of Surgery.  2.  If you choose to wash your hair, wash your hair first as usual with your  normal  shampoo.  3.  After you shampoo, rinse your hair and body thoroughly to remove the  shampoo.                           4.  Use CHG as you would any other liquid soap.  You can apply chg directly  to the skin and wash                       Gently with a scrungie or clean washcloth.  5.  Apply the CHG Soap to your body ONLY FROM  THE NECK DOWN.   Do not use on face/ open                           Wound or open sores. Avoid contact with eyes, ears mouth and genitals (private parts).                       Wash face,  Genitals (private parts) with your normal soap.             6.  Wash thoroughly, paying special attention to the area where your surgery  will be performed.  7.  Thoroughly rinse your body with warm water from the neck down.  8.  DO NOT shower/wash with your normal soap after using and rinsing off  the CHG Soap.                9.  Pat yourself dry with a clean towel.            10.  Wear clean pajamas.            11.  Place clean sheets on your bed the night of your first shower and do not  sleep with pets. Day of Surgery : Do not apply any lotions/deodorants the morning of surgery.  Please wear clean clothes to the hospital/surgery center.  FAILURE TO FOLLOW THESE INSTRUCTIONS MAY RESULT IN THE CANCELLATION OF YOUR SURGERY PATIENT SIGNATURE_________________________________  NURSE SIGNATURE__________________________________  ________________________________________________________________________

## 2017-11-25 ENCOUNTER — Encounter (HOSPITAL_COMMUNITY)
Admission: RE | Admit: 2017-11-25 | Discharge: 2017-11-25 | Disposition: A | Discharge status: Home / Self Care | Payer: Medicare Other | Source: Ambulatory Visit | Attending: Urology | Admitting: Urology

## 2017-11-25 ENCOUNTER — Other Ambulatory Visit: Payer: Self-pay

## 2017-11-25 ENCOUNTER — Encounter (HOSPITAL_COMMUNITY): Payer: Self-pay

## 2017-11-25 DIAGNOSIS — M069 Rheumatoid arthritis, unspecified: Secondary | ICD-10-CM | POA: Diagnosis not present

## 2017-11-25 DIAGNOSIS — I509 Heart failure, unspecified: Secondary | ICD-10-CM | POA: Diagnosis not present

## 2017-11-25 DIAGNOSIS — G4730 Sleep apnea, unspecified: Secondary | ICD-10-CM | POA: Diagnosis not present

## 2017-11-25 DIAGNOSIS — I4891 Unspecified atrial fibrillation: Secondary | ICD-10-CM | POA: Diagnosis not present

## 2017-11-25 DIAGNOSIS — M797 Fibromyalgia: Secondary | ICD-10-CM | POA: Diagnosis not present

## 2017-11-25 DIAGNOSIS — Z91040 Latex allergy status: Secondary | ICD-10-CM | POA: Diagnosis not present

## 2017-11-25 DIAGNOSIS — E119 Type 2 diabetes mellitus without complications: Secondary | ICD-10-CM | POA: Diagnosis not present

## 2017-11-25 DIAGNOSIS — K219 Gastro-esophageal reflux disease without esophagitis: Secondary | ICD-10-CM | POA: Diagnosis not present

## 2017-11-25 DIAGNOSIS — N329 Bladder disorder, unspecified: Secondary | ICD-10-CM | POA: Diagnosis present

## 2017-11-25 DIAGNOSIS — Z87891 Personal history of nicotine dependence: Secondary | ICD-10-CM | POA: Diagnosis not present

## 2017-11-25 DIAGNOSIS — F329 Major depressive disorder, single episode, unspecified: Secondary | ICD-10-CM | POA: Diagnosis not present

## 2017-11-25 DIAGNOSIS — N3090 Cystitis, unspecified without hematuria: Secondary | ICD-10-CM | POA: Diagnosis not present

## 2017-11-25 DIAGNOSIS — F419 Anxiety disorder, unspecified: Secondary | ICD-10-CM | POA: Diagnosis not present

## 2017-11-25 DIAGNOSIS — Z888 Allergy status to other drugs, medicaments and biological substances status: Secondary | ICD-10-CM | POA: Diagnosis not present

## 2017-11-25 DIAGNOSIS — Z79899 Other long term (current) drug therapy: Secondary | ICD-10-CM | POA: Diagnosis not present

## 2017-11-25 DIAGNOSIS — G8929 Other chronic pain: Secondary | ICD-10-CM | POA: Diagnosis not present

## 2017-11-25 DIAGNOSIS — Z6831 Body mass index (BMI) 31.0-31.9, adult: Secondary | ICD-10-CM | POA: Diagnosis not present

## 2017-11-25 DIAGNOSIS — E785 Hyperlipidemia, unspecified: Secondary | ICD-10-CM | POA: Diagnosis not present

## 2017-11-25 DIAGNOSIS — N189 Chronic kidney disease, unspecified: Secondary | ICD-10-CM | POA: Diagnosis not present

## 2017-11-25 DIAGNOSIS — I129 Hypertensive chronic kidney disease with stage 1 through stage 4 chronic kidney disease, or unspecified chronic kidney disease: Secondary | ICD-10-CM | POA: Diagnosis not present

## 2017-11-25 LAB — BASIC METABOLIC PANEL
ANION GAP: 5 (ref 5–15)
BUN: 48 mg/dL — AB (ref 6–20)
CALCIUM: 8.7 mg/dL — AB (ref 8.9–10.3)
CO2: 27 mmol/L (ref 22–32)
Chloride: 105 mmol/L (ref 101–111)
Creatinine, Ser: 2.05 mg/dL — ABNORMAL HIGH (ref 0.44–1.00)
GFR calc Af Amer: 26 mL/min — ABNORMAL LOW (ref >60)
GFR, EST NON AFRICAN AMERICAN: 22 mL/min — AB (ref >60)
Glucose, Bld: 105 mg/dL — ABNORMAL HIGH (ref 65–99)
Potassium: 4.6 mmol/L (ref 3.5–5.1)
Sodium: 137 mmol/L (ref 135–145)

## 2017-11-25 LAB — HEMOGLOBIN A1C
HEMOGLOBIN A1C: 5.7 % — AB (ref 4.8–5.6)
MEAN PLASMA GLUCOSE: 116.89 mg/dL

## 2017-11-25 LAB — CBC
HEMATOCRIT: 33.3 % — AB (ref 36.0–46.0)
HEMOGLOBIN: 10.7 g/dL — AB (ref 12.0–15.0)
MCH: 30 pg (ref 26.0–34.0)
MCHC: 32.1 g/dL (ref 30.0–36.0)
MCV: 93.3 fL (ref 78.0–100.0)
Platelets: 252 10*3/uL (ref 150–400)
RBC: 3.57 MIL/uL — ABNORMAL LOW (ref 3.87–5.11)
RDW: 13 % (ref 11.5–15.5)
WBC: 23 10*3/uL — ABNORMAL HIGH (ref 4.0–10.5)

## 2017-11-25 NOTE — Progress Notes (Signed)
11-25-17 CBC and BMP routed to Dr. Lovena Neighbours for review.

## 2017-11-25 NOTE — H&P (Signed)
Urology Preoperative H&P   Chief Complaint: Bladder lesion  History of Present Illness: Gloria Best is a 77 y.o. female with a history of urinary urgency/frequency treated with Myrbetriq and suspicious bladder lesions noted on CT angio and office cystoscopy.   CT angiogram from 09/03/2017 that showed a focal area bladder wall thickening concerning for possible transitional cell carcinoma. She had a cystoscopy on 11/04/17 that showed 3 distinct and persistent erythematous lesions involving the bladder mucosa, concerning for malignancy.  She is here today for cystoscopy with bladder biopsy to assess these lesions.  Past Medical History:  Diagnosis Date  . Acquired hallux valgus 02/21/2017  . Acute renal failure (ARF) ([place])   . Altered mental status   . Anemia    hx of blood transfusion  . Anxiety   . Arthritis    RA  . Asthma    hx of as child  . At risk for adverse drug event 12/14/2016   12/11/16 markedly somnulent post operative day 1  RIOSORD (Risk Index for Overdose or Serious Opioid -induced Respiratory Depression Risk ) is a tool which determines that risk over the  ensuing 6 months . Based on point total for personal medical history and present drug therapies ; the risk : benefit ratio of any drug regimen can be defined . Significant chronic kidney disease...8 Sleep apnea...5 Active prescriptions for: Antidepressant...8; Scoring:0-4 points... 2% risk;5-7...5%;8-9...7%;10-17...15%;18-25...30%;26-41...55%; >42 points...83%. Total points 21 ; 6 month risk 30% of overdose or death   . AVN (avascular necrosis of bone) ([place]) 06/09/2012  . Bladder disorder    having difficulty voiding, requires in and out catheter 2x day and prn; denied current need for I&O cath 08/27/16  . Bronchitis    hx of  . Carpal tunnel syndrome of left wrist    hx of  . Chronic back pain   . Chronic kidney disease   . Constipated   . Depression    takes medication for   . Diabetes mellitus    oral medication   . Dyslipidemia 04/23/2016  . Dysrhythmia    takes cardizem, sees Dr. Marco Collie primary  . Elevated WBC count 12/21/2016  . Fibromyalgia    takes lyrica  . GERD (gastroesophageal reflux disease)   . H/O hiatal hernia    hx of   . Heart murmur   . Hematemesis with nausea   . History of foot ulcer 02/21/2017  . Hypertension   . Osteoarthritis of right hip 06/09/2012  . Pneumonia    hx of  . Postoperative anemia due to acute blood loss 06/12/2012   Decreased in hemoglobin from 9 to 7.9  . Postoperative urinary retention 12/13/2016   Does have a history of some urinary retention and in and out catheterization briefly in 08/2016.  Marland Kitchen Protein-calorie malnutrition, severe 12/26/2016  . Sleep apnea    no CPAP. last sleep study over 10 years ago  . Tailor's bunion of left foot 11/15/2016  . 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 Unilateral primary osteoarthritis, right knee 12/10/2016   Right knee severe osteoarthritis, tricompartment.  . Urinary tract infection    hx of  . Wears dentures    top    Past Surgical History:  Procedure Laterality Date  . ABDOMINAL HYSTERECTOMY    . APPENDECTOMY    . BACK SURGERY  x 3  . CARPAL TUNNEL RELEASE     left side only  . CATARACT EXTRACTION     bilateral  . CESAREAN SECTION    . COLON SURGERY     "due to large polyp"  . COLONOSCOPY    . ESOPHAGOGASTRODUODENOSCOPY (EGD) WITH PROPOFOL N/A 12/26/2016   Procedure: ESOPHAGOGASTRODUODENOSCOPY (EGD) WITH PROPOFOL;  Surgeon: Doran Stabler, MD;  Location: [facility];  Service: Endoscopy;  Laterality: N/A;  . FOOT SURGERY    . HERNIA REPAIR    . JOINT REPLACEMENT    . SPINE SURGERY    . TENOLYSIS Left 11/12/2013   Procedure: LEFT TENDON GRAFT RECONSTRUCTION OF LEFT LONG FINGER SAGITTAL FIBERS CORD RELEASE;  Surgeon: Cammie Sickle., MD;   Location: [facility];  Service: Orthopedics;  Laterality: Left;  . TOTAL HIP ARTHROPLASTY  06/09/2012   Procedure: TOTAL HIP ARTHROPLASTY;  Surgeon: [name], MD;  Location: [facility];  Service: Orthopedics;  Laterality: Right;  Right total hip replacement  . TOTAL KNEE ARTHROPLASTY Right 12/10/2016   Procedure: RIGHT TOTAL KNEE ARTHROPLASTY;  Surgeon: [name], MD;  Location: [facility];  Service: Orthopedics;  Laterality: Right;    Allergies:  Allergies  Allergen Reactions  . Adhesive [Tape] Other (See Comments)    Tears skin  . Latex Other (See Comments)    Blisters  . Lotrel [Amlodipine Besy-Benazepril Hcl] Itching and Cough    Family History  Problem Relation Age of Onset  . Diabetes Mother   . Diabetes Father   . Diabetes Sister   . Diabetes Brother   . Heart disease Neg Hx   . Stroke Neg Hx   . Cancer Neg Hx     Social History:  reports that she quit smoking about 33 years ago. she has never used smokeless tobacco. She reports that she does not drink alcohol or use drugs.  ROS: A complete review of systems was performed.  All systems are negative except for pertinent findings as noted.  Physical Exam:  Vital signs in last 24 hours: Temp:  [99 F (37.2 C)] 99 F (37.2 C) (01/21 1326) Pulse Rate:  [53] 53 (01/21 1326) Resp:  [18] 18 (01/21 1326) BP: (122)/(50) 122/50 (01/21 1326) SpO2:  [98 %] 98 % (01/21 1326) Weight:  [69.5 kg (153 lb 4 oz)] 69.5 kg (153 lb 4 oz) (01/21 1326) Constitutional:  Alert and oriented, No acute distress Cardiovascular: Regular rate and rhythm, No JVD Respiratory: Normal respiratory effort, Lungs clear bilaterally GI: Abdomen is soft, nontender, nondistended, no abdominal masses GU: No CVA tenderness Lymphatic: No lymphadenopathy Neurologic: Grossly intact, no focal deficits Psychiatric: Normal mood and affect  Laboratory Data:  No results for input(s): WBC, HGB, HCT, PLT in the last 72 hours.  No results for  input(s): NA, K, CL, GLUCOSE, BUN, CALCIUM, CREATININE in the last 72 hours.  Invalid input(s): CO3   No results found for this or any previous visit (from the past 24 hour(s)). No results found for this or any previous visit (from the past 240 hour(s)).  Renal Function: No results for input(s): CREATININE in the last 168 hours. CrCl cannot be calculated (Patient's most recent lab result is older than the maximum 21 days allowed.).  Radiologic Imaging: No results found.  I independently reviewed the above imaging studies.  Assessment and Plan Gloria Best is a 77 y.o. female with multiple bladder lesions concerning for malignancy.  History of type 2 diabetes, atrial  fibrillation.   -The risks, benefits and alternatives of cystoscopy with bladder biopsy and fulguration was discussed with the patient.  Risks including bleeding, infection, bladder perforation, prolonged bladder catheterization and negative pathology was discussed with the patient. She voices understanding and wishes to proceed.   Ellison Hughs, MD 11/25/2017, 2:56 PM  [facility] Pager: [phone]

## 2017-11-27 ENCOUNTER — Ambulatory Visit (HOSPITAL_COMMUNITY)
Admission: RE | Admit: 2017-11-27 | Discharge: 2017-11-27 | Disposition: A | Discharge status: Home / Self Care | Payer: Medicare Other | Source: Ambulatory Visit | Attending: Urology | Admitting: Urology

## 2017-11-27 ENCOUNTER — Ambulatory Visit (HOSPITAL_COMMUNITY): Payer: Medicare Other | Admitting: Anesthesiology

## 2017-11-27 ENCOUNTER — Other Ambulatory Visit: Payer: Self-pay

## 2017-11-27 ENCOUNTER — Encounter (HOSPITAL_COMMUNITY)
Admission: RE | Disposition: A | Discharge status: Home / Self Care | Payer: Self-pay | Source: Ambulatory Visit | Attending: Urology

## 2017-11-27 ENCOUNTER — Encounter (HOSPITAL_COMMUNITY): Payer: Self-pay | Admitting: *Deleted

## 2017-11-27 DIAGNOSIS — K219 Gastro-esophageal reflux disease without esophagitis: Secondary | ICD-10-CM | POA: Diagnosis not present

## 2017-11-27 DIAGNOSIS — F329 Major depressive disorder, single episode, unspecified: Secondary | ICD-10-CM | POA: Insufficient documentation

## 2017-11-27 DIAGNOSIS — E1122 Type 2 diabetes mellitus with diabetic chronic kidney disease: Secondary | ICD-10-CM | POA: Diagnosis not present

## 2017-11-27 DIAGNOSIS — D494 Neoplasm of unspecified behavior of bladder: Secondary | ICD-10-CM | POA: Diagnosis not present

## 2017-11-27 DIAGNOSIS — M069 Rheumatoid arthritis, unspecified: Secondary | ICD-10-CM | POA: Diagnosis not present

## 2017-11-27 DIAGNOSIS — E785 Hyperlipidemia, unspecified: Secondary | ICD-10-CM | POA: Diagnosis not present

## 2017-11-27 DIAGNOSIS — N3090 Cystitis, unspecified without hematuria: Secondary | ICD-10-CM | POA: Diagnosis not present

## 2017-11-27 DIAGNOSIS — Z91040 Latex allergy status: Secondary | ICD-10-CM | POA: Insufficient documentation

## 2017-11-27 DIAGNOSIS — Z888 Allergy status to other drugs, medicaments and biological substances status: Secondary | ICD-10-CM | POA: Insufficient documentation

## 2017-11-27 DIAGNOSIS — N329 Bladder disorder, unspecified: Secondary | ICD-10-CM | POA: Diagnosis not present

## 2017-11-27 DIAGNOSIS — N184 Chronic kidney disease, stage 4 (severe): Secondary | ICD-10-CM | POA: Diagnosis not present

## 2017-11-27 DIAGNOSIS — I129 Hypertensive chronic kidney disease with stage 1 through stage 4 chronic kidney disease, or unspecified chronic kidney disease: Secondary | ICD-10-CM | POA: Diagnosis not present

## 2017-11-27 DIAGNOSIS — Z79899 Other long term (current) drug therapy: Secondary | ICD-10-CM | POA: Insufficient documentation

## 2017-11-27 DIAGNOSIS — Z87891 Personal history of nicotine dependence: Secondary | ICD-10-CM | POA: Insufficient documentation

## 2017-11-27 DIAGNOSIS — N189 Chronic kidney disease, unspecified: Secondary | ICD-10-CM | POA: Insufficient documentation

## 2017-11-27 DIAGNOSIS — I4891 Unspecified atrial fibrillation: Secondary | ICD-10-CM | POA: Insufficient documentation

## 2017-11-27 DIAGNOSIS — M797 Fibromyalgia: Secondary | ICD-10-CM | POA: Diagnosis not present

## 2017-11-27 DIAGNOSIS — F419 Anxiety disorder, unspecified: Secondary | ICD-10-CM | POA: Diagnosis not present

## 2017-11-27 DIAGNOSIS — E119 Type 2 diabetes mellitus without complications: Secondary | ICD-10-CM | POA: Diagnosis not present

## 2017-11-27 DIAGNOSIS — G8929 Other chronic pain: Secondary | ICD-10-CM | POA: Diagnosis not present

## 2017-11-27 DIAGNOSIS — G4730 Sleep apnea, unspecified: Secondary | ICD-10-CM | POA: Insufficient documentation

## 2017-11-27 HISTORY — PX: CYSTOSCOPY WITH BIOPSY: SHX5122

## 2017-11-27 LAB — GLUCOSE, CAPILLARY: Glucose-Capillary: 123 mg/dL — ABNORMAL HIGH (ref 65–99)

## 2017-11-27 SURGERY — CYSTOSCOPY, WITH BIOPSY
Anesthesia: General

## 2017-11-27 MED ORDER — FENTANYL CITRATE (PF) 100 MCG/2ML IJ SOLN
25.0000 ug | INTRAMUSCULAR | Status: DC | PRN
  Administered 2017-11-27: 25 ug via INTRAVENOUS

## 2017-11-27 MED ORDER — PROPOFOL 10 MG/ML IV BOLUS
INTRAVENOUS | Status: DC | PRN
  Administered 2017-11-27: 140 mg via INTRAVENOUS

## 2017-11-27 MED ORDER — PROPOFOL 10 MG/ML IV BOLUS
INTRAVENOUS | Status: AC
  Filled 2017-11-27: qty 20

## 2017-11-27 MED ORDER — FENTANYL CITRATE (PF) 100 MCG/2ML IJ SOLN
INTRAMUSCULAR | Status: AC
  Administered 2017-11-27: 25 ug via INTRAVENOUS
  Filled 2017-11-27: qty 2

## 2017-11-27 MED ORDER — PHENAZOPYRIDINE HCL 200 MG PO TABS: 200.0000 mg | ORAL_TABLET | Freq: Three times a day (TID) | ORAL | 0 refills | Status: AC | PRN

## 2017-11-27 MED ORDER — ONDANSETRON HCL 4 MG/2ML IJ SOLN
INTRAMUSCULAR | Status: AC
  Filled 2017-11-27: qty 2

## 2017-11-27 MED ORDER — LIDOCAINE 2% (20 MG/ML) 5 ML SYRINGE
INTRAMUSCULAR | Status: DC | PRN
  Administered 2017-11-27: 60 mg via INTRAVENOUS

## 2017-11-27 MED ORDER — CEFAZOLIN SODIUM-DEXTROSE 2-4 GM/100ML-% IV SOLN
INTRAVENOUS | Status: AC
  Filled 2017-11-27: qty 100

## 2017-11-27 MED ORDER — CEPHALEXIN 250 MG PO CAPS: 250.0000 mg | ORAL_CAPSULE | Freq: Four times a day (QID) | ORAL | 0 refills | Status: AC

## 2017-11-27 MED ORDER — CEFAZOLIN SODIUM-DEXTROSE 2-4 GM/100ML-% IV SOLN
2.0000 g | Freq: Once | INTRAVENOUS | Status: AC
  Administered 2017-11-27: 2 g via INTRAVENOUS

## 2017-11-27 MED ORDER — PROMETHAZINE HCL 25 MG/ML IJ SOLN: 6.2500 mg | INTRAMUSCULAR | Status: DC | PRN

## 2017-11-27 MED ORDER — LACTATED RINGERS IV SOLN
INTRAVENOUS | Status: DC
  Administered 2017-11-27 (×2): via INTRAVENOUS

## 2017-11-27 MED ORDER — ONDANSETRON HCL 4 MG/2ML IJ SOLN
INTRAMUSCULAR | Status: DC | PRN
  Administered 2017-11-27: 4 mg via INTRAVENOUS

## 2017-11-27 MED ORDER — FENTANYL CITRATE (PF) 100 MCG/2ML IJ SOLN
INTRAMUSCULAR | Status: AC
  Filled 2017-11-27: qty 2

## 2017-11-27 MED ORDER — STERILE WATER FOR IRRIGATION IR SOLN
Status: DC | PRN
  Administered 2017-11-27: 3000 mL

## 2017-11-27 MED ORDER — ONDANSETRON HCL 4 MG PO TABS: 4.0000 mg | ORAL_TABLET | Freq: Every day | ORAL | 1 refills | Status: AC | PRN

## 2017-11-27 SURGICAL SUPPLY — 13 items
BAG URINE DRAINAGE (UROLOGICAL SUPPLIES) IMPLANT
BAG URO CATCHER STRL LF (MISCELLANEOUS) ×3 IMPLANT
COVER FOOTSWITCH UNIV (MISCELLANEOUS) ×2 IMPLANT
ELECT REM PT RETURN 15FT ADLT (MISCELLANEOUS) ×3 IMPLANT
GLOVE BIOGEL M STRL SZ7.5 (GLOVE) ×3 IMPLANT
GOWN STRL REUS W/TWL XL LVL3 (GOWN DISPOSABLE) ×5 IMPLANT
LOOP CUT BIPOLAR 24F LRG (ELECTROSURGICAL) IMPLANT
MANIFOLD NEPTUNE II (INSTRUMENTS) ×3 IMPLANT
PACK CYSTO (CUSTOM PROCEDURE TRAY) ×3 IMPLANT
SET ASPIRATION TUBING (TUBING) IMPLANT
SYRINGE IRR TOOMEY STRL 70CC (SYRINGE) IMPLANT
TUBING CONNECTING 10 (TUBING) ×2 IMPLANT
TUBING CONNECTING 10' (TUBING) ×1

## 2017-11-27 NOTE — Anesthesia Procedure Notes (Signed)
Procedure Name: LMA Insertion Date/Time: 11/27/2017 8:31 AM Performed by: [place] Farrel Guimond C, CRNA Pre-anesthesia Checklist: Patient identified, Emergency Drugs available, Suction available and Patient being monitored Patient Re-evaluated:Patient Re-evaluated prior to induction Oxygen Delivery Method: Circle system utilized Preoxygenation: Pre-oxygenation with 100% oxygen Induction Type: IV induction Ventilation: Mask ventilation without difficulty LMA: LMA inserted LMA Size: 4.0 Number of attempts: 1 Airway Equipment and Method: Bite block Placement Confirmation: positive ETCO2 Tube secured with: Tape Dental Injury: Teeth and Oropharynx as per pre-operative assessment

## 2017-11-27 NOTE — Op Note (Signed)
Operative Note  Preoperative diagnosis:  1.  Bladder lesion  Postoperative diagnosis: 1.  Same  Procedure(s): 1.  Cystoscopy with bladder biopsy and fulguration  Surgeon: Ellison Hughs, MD  Assistants:  None  Anesthesia:  Gen. LMA  Complications:  None  EBL:  <5 mL  Specimens: 1. Posterior bladder wall biopsies  Drains/Catheters: 1.  None  Intraoperative findings:  Erythematous lesions involving the posterior bladder wall concerning for CIS vs chronic inflammation   Indication:  Gloria Best is a 77 y.o. female with a history of urinary urgency/frequency treated with Myrbetriq and suspicious bladder lesions noted on CT angio and office cystoscopy.   CT angiogram from 09/03/2017 that showed a focal area bladder wall thickening concerning for possible transitional cell carcinoma. She had a cystoscopy on 11/04/17 that showed 3 distinct and persistent erythematous lesions involving the bladder mucosa, concerning for malignancy.  She is here today for cystoscopy with bladder biopsy to assess these lesions.  Description of procedure:  After informed consent was obtained, the patient was brought to the operating room and general LMA anesthesia was administered. The patient was then placed in the dorsolithotomy position and prepped and draped in usual sterile fashion. A timeout was performed. A 21 French rigid cystoscope was then inserted into the urethral meatus and advanced into the bladder under direct vision. A complete bladder survey revealed 2 erythematous lesions involving the posterior bladder wall with no other intravesical pathology. Cold cup biopsies were then taken of the bladder mucosa involving the 2 erythematous lesions. The biopsy specimens were then sent to pathology for permanent section. The areas of biopsy were then extensively fulgurated until hemostasis was achieved. The patient's bladder was incompletely drained . She tolerated the procedure well and was  transferred to the postanesthesia in stable condition.  Plan:  Follow-up in 1 week to discuss pathology results

## 2017-11-27 NOTE — Interval H&P Note (Signed)
History and Physical Interval Note:  11/27/2017 8:21 AM  Gloria Best  has presented today for surgery, with the diagnosis of BLADDER LESION  The various methods of treatment have been discussed with the patient and family. After consideration of risks, benefits and other options for treatment, the patient has consented to  Procedure(s): CYSTOSCOPY WITH BIOPSY OF BLADDER (N/A) as a surgical intervention .  The patient's history has been reviewed, patient examined, no change in status, stable for surgery.  I have reviewed the patient's chart and labs.  Questions were answered to the patient's satisfaction.     Conception Oms Winter

## 2017-11-27 NOTE — Anesthesia Preprocedure Evaluation (Signed)
Anesthesia Evaluation  Patient identified by MRN, date of birth, ID band Patient awake    Reviewed: Allergy & Precautions, NPO status , Patient's Chart, lab work & pertinent test results  Airway Mallampati: II  TM Distance: >3 FB Neck ROM: Full    Dental no notable dental hx.    Pulmonary sleep apnea , former smoker,    Pulmonary exam normal breath sounds clear to auscultation       Cardiovascular hypertension, Normal cardiovascular exam Rhythm:Regular Rate:Normal     Neuro/Psych negative neurological ROS  negative psych ROS   GI/Hepatic Neg liver ROS, GERD  ,  Endo/Other  diabetes  Renal/GU Renal InsufficiencyRenal disease  negative genitourinary   Musculoskeletal  (+) Arthritis , Rheumatoid disorders,    Abdominal   Peds negative pediatric ROS (+)  Hematology  (+) anemia ,   Anesthesia Other Findings   Reproductive/Obstetrics negative OB ROS                             Anesthesia Physical Anesthesia Plan  ASA: III  Anesthesia Plan: General   Post-op Pain Management:    Induction: Intravenous  PONV Risk Score and Plan: 2 and Ondansetron, Dexamethasone and Treatment may vary due to age or medical condition  Airway Management Planned: LMA  Additional Equipment:   Intra-op Plan:   Post-operative Plan: Extubation in OR  Informed Consent: I have reviewed the patients History and Physical, chart, labs and discussed the procedure including the risks, benefits and alternatives for the proposed anesthesia with the patient or authorized representative who has indicated his/her understanding and acceptance.   Dental advisory given  Plan Discussed with: CRNA and Surgeon  Anesthesia Plan Comments:         Anesthesia Quick Evaluation

## 2017-11-27 NOTE — Transfer of Care (Signed)
Immediate Anesthesia Transfer of Care Note  Patient: Gloria Best  Procedure(s) Performed: CYSTOSCOPY WITH BIOPSY OF BLADDER (N/A )  Patient Location: PACU  Anesthesia Type:General  Level of Consciousness: awake, alert  and oriented  Airway & Oxygen Therapy: Patient Spontanous Breathing and Patient connected to face mask oxygen  Post-op Assessment: Report given to RN and Post -op Vital signs reviewed and stable  Post vital signs: Reviewed and stable  Last Vitals:  Vitals:   11/27/17 0654 11/27/17 0905  BP: (!) 154/61 (!) 139/52  Pulse: (!) 57 (!) 50  Resp: 16 14  Temp: 36.7 C 36.5 C  SpO2: 94% 100%    Last Pain:  Vitals:   11/27/17 0654  TempSrc: Oral      Patients Stated Pain Goal: 3 (94/70/76 1518)  Complications: No apparent anesthesia complications

## 2017-11-28 ENCOUNTER — Encounter (HOSPITAL_COMMUNITY): Payer: Self-pay | Admitting: Urology

## 2017-11-28 DIAGNOSIS — S91302D Unspecified open wound, left foot, subsequent encounter: Secondary | ICD-10-CM | POA: Diagnosis not present

## 2017-11-28 DIAGNOSIS — E1142 Type 2 diabetes mellitus with diabetic polyneuropathy: Secondary | ICD-10-CM | POA: Diagnosis not present

## 2017-11-28 DIAGNOSIS — T85848D Pain due to other internal prosthetic devices, implants and grafts, subsequent encounter: Secondary | ICD-10-CM | POA: Diagnosis not present

## 2017-11-29 DIAGNOSIS — I509 Heart failure, unspecified: Secondary | ICD-10-CM | POA: Diagnosis not present

## 2017-11-29 DIAGNOSIS — Z6831 Body mass index (BMI) 31.0-31.9, adult: Secondary | ICD-10-CM | POA: Diagnosis not present

## 2017-11-29 NOTE — Anesthesia Postprocedure Evaluation (Signed)
Anesthesia Post Note  Patient: Gloria Best  Procedure(s) Performed: CYSTOSCOPY WITH BIOPSY OF BLADDER (N/A )     Patient location during evaluation: PACU Anesthesia Type: General Level of consciousness: awake and alert Pain management: pain level controlled Vital Signs Assessment: post-procedure vital signs reviewed and stable Respiratory status: spontaneous breathing, nonlabored ventilation, respiratory function stable and patient connected to nasal cannula oxygen Cardiovascular status: blood pressure returned to baseline and stable Postop Assessment: no apparent nausea or vomiting Anesthetic complications: no    Last Vitals:  Vitals:   11/27/17 1000 11/27/17 1031  BP: 130/68 (!) 141/54  Pulse: (!) 57 (!) 58  Resp: 16 14  Temp: 36.6 C 36.4 C  SpO2: 92% 96%    Last Pain:  Vitals:   11/27/17 1031  TempSrc:   PainSc: 3                  Auryn Paige S

## 2017-12-02 DIAGNOSIS — Z6830 Body mass index (BMI) 30.0-30.9, adult: Secondary | ICD-10-CM | POA: Diagnosis not present

## 2017-12-02 DIAGNOSIS — I509 Heart failure, unspecified: Secondary | ICD-10-CM | POA: Diagnosis not present

## 2017-12-02 DIAGNOSIS — I5032 Chronic diastolic (congestive) heart failure: Secondary | ICD-10-CM | POA: Diagnosis not present

## 2017-12-05 DIAGNOSIS — N329 Bladder disorder, unspecified: Secondary | ICD-10-CM | POA: Diagnosis not present

## 2017-12-05 DIAGNOSIS — R350 Frequency of micturition: Secondary | ICD-10-CM | POA: Diagnosis not present

## 2017-12-05 DIAGNOSIS — N3281 Overactive bladder: Secondary | ICD-10-CM | POA: Diagnosis not present

## 2017-12-05 DIAGNOSIS — R3915 Urgency of urination: Secondary | ICD-10-CM | POA: Diagnosis not present

## 2017-12-05 DIAGNOSIS — N3020 Other chronic cystitis without hematuria: Secondary | ICD-10-CM | POA: Diagnosis not present

## 2017-12-09 DIAGNOSIS — M0589 Other rheumatoid arthritis with rheumatoid factor of multiple sites: Secondary | ICD-10-CM | POA: Diagnosis not present

## 2017-12-13 DIAGNOSIS — N390 Urinary tract infection, site not specified: Secondary | ICD-10-CM | POA: Diagnosis not present

## 2017-12-13 DIAGNOSIS — I509 Heart failure, unspecified: Secondary | ICD-10-CM | POA: Diagnosis not present

## 2017-12-13 DIAGNOSIS — Z139 Encounter for screening, unspecified: Secondary | ICD-10-CM | POA: Diagnosis not present

## 2017-12-13 DIAGNOSIS — Z6831 Body mass index (BMI) 31.0-31.9, adult: Secondary | ICD-10-CM | POA: Diagnosis not present

## 2017-12-16 DIAGNOSIS — B3749 Other urogenital candidiasis: Secondary | ICD-10-CM | POA: Diagnosis not present

## 2017-12-16 DIAGNOSIS — I509 Heart failure, unspecified: Secondary | ICD-10-CM | POA: Diagnosis not present

## 2017-12-16 DIAGNOSIS — Z6831 Body mass index (BMI) 31.0-31.9, adult: Secondary | ICD-10-CM | POA: Diagnosis not present

## 2017-12-20 DIAGNOSIS — N189 Chronic kidney disease, unspecified: Secondary | ICD-10-CM | POA: Diagnosis not present

## 2017-12-20 DIAGNOSIS — I509 Heart failure, unspecified: Secondary | ICD-10-CM | POA: Diagnosis not present

## 2017-12-20 DIAGNOSIS — E538 Deficiency of other specified B group vitamins: Secondary | ICD-10-CM | POA: Diagnosis not present

## 2017-12-20 DIAGNOSIS — R001 Bradycardia, unspecified: Secondary | ICD-10-CM | POA: Diagnosis not present

## 2017-12-20 DIAGNOSIS — D638 Anemia in other chronic diseases classified elsewhere: Secondary | ICD-10-CM | POA: Diagnosis not present

## 2017-12-20 DIAGNOSIS — D631 Anemia in chronic kidney disease: Secondary | ICD-10-CM | POA: Diagnosis not present

## 2017-12-20 DIAGNOSIS — D649 Anemia, unspecified: Secondary | ICD-10-CM | POA: Diagnosis not present

## 2017-12-20 DIAGNOSIS — M069 Rheumatoid arthritis, unspecified: Secondary | ICD-10-CM | POA: Diagnosis not present

## 2017-12-26 DIAGNOSIS — I5032 Chronic diastolic (congestive) heart failure: Secondary | ICD-10-CM | POA: Diagnosis not present

## 2017-12-26 DIAGNOSIS — E1122 Type 2 diabetes mellitus with diabetic chronic kidney disease: Secondary | ICD-10-CM | POA: Diagnosis not present

## 2017-12-26 DIAGNOSIS — N184 Chronic kidney disease, stage 4 (severe): Secondary | ICD-10-CM | POA: Diagnosis not present

## 2017-12-26 DIAGNOSIS — I129 Hypertensive chronic kidney disease with stage 1 through stage 4 chronic kidney disease, or unspecified chronic kidney disease: Secondary | ICD-10-CM | POA: Diagnosis not present

## 2018-01-06 DIAGNOSIS — Z79899 Other long term (current) drug therapy: Secondary | ICD-10-CM | POA: Diagnosis not present

## 2018-01-06 DIAGNOSIS — M0579 Rheumatoid arthritis with rheumatoid factor of multiple sites without organ or systems involvement: Secondary | ICD-10-CM | POA: Diagnosis not present

## 2018-01-06 DIAGNOSIS — M0589 Other rheumatoid arthritis with rheumatoid factor of multiple sites: Secondary | ICD-10-CM | POA: Diagnosis not present

## 2018-01-07 DIAGNOSIS — I129 Hypertensive chronic kidney disease with stage 1 through stage 4 chronic kidney disease, or unspecified chronic kidney disease: Secondary | ICD-10-CM | POA: Diagnosis not present

## 2018-01-07 DIAGNOSIS — Z6830 Body mass index (BMI) 30.0-30.9, adult: Secondary | ICD-10-CM | POA: Diagnosis not present

## 2018-01-07 DIAGNOSIS — N184 Chronic kidney disease, stage 4 (severe): Secondary | ICD-10-CM | POA: Diagnosis not present

## 2018-01-07 DIAGNOSIS — E1122 Type 2 diabetes mellitus with diabetic chronic kidney disease: Secondary | ICD-10-CM | POA: Diagnosis not present

## 2018-01-09 DIAGNOSIS — E1122 Type 2 diabetes mellitus with diabetic chronic kidney disease: Secondary | ICD-10-CM | POA: Diagnosis not present

## 2018-01-09 DIAGNOSIS — I129 Hypertensive chronic kidney disease with stage 1 through stage 4 chronic kidney disease, or unspecified chronic kidney disease: Secondary | ICD-10-CM | POA: Diagnosis not present

## 2018-01-09 DIAGNOSIS — I5032 Chronic diastolic (congestive) heart failure: Secondary | ICD-10-CM | POA: Diagnosis not present

## 2018-01-09 DIAGNOSIS — N184 Chronic kidney disease, stage 4 (severe): Secondary | ICD-10-CM | POA: Diagnosis not present

## 2018-01-14 DIAGNOSIS — E669 Obesity, unspecified: Secondary | ICD-10-CM | POA: Diagnosis not present

## 2018-01-14 DIAGNOSIS — M0579 Rheumatoid arthritis with rheumatoid factor of multiple sites without organ or systems involvement: Secondary | ICD-10-CM | POA: Diagnosis not present

## 2018-01-14 DIAGNOSIS — M150 Primary generalized (osteo)arthritis: Secondary | ICD-10-CM | POA: Diagnosis not present

## 2018-01-14 DIAGNOSIS — Z6830 Body mass index (BMI) 30.0-30.9, adult: Secondary | ICD-10-CM | POA: Diagnosis not present

## 2018-01-14 DIAGNOSIS — R7612 Nonspecific reaction to cell mediated immunity measurement of gamma interferon antigen response without active tuberculosis: Secondary | ICD-10-CM | POA: Diagnosis not present

## 2018-01-14 DIAGNOSIS — M797 Fibromyalgia: Secondary | ICD-10-CM | POA: Diagnosis not present

## 2018-01-20 DIAGNOSIS — D631 Anemia in chronic kidney disease: Secondary | ICD-10-CM | POA: Diagnosis not present

## 2018-01-20 DIAGNOSIS — N189 Chronic kidney disease, unspecified: Secondary | ICD-10-CM | POA: Diagnosis not present

## 2018-01-23 DIAGNOSIS — N184 Chronic kidney disease, stage 4 (severe): Secondary | ICD-10-CM | POA: Diagnosis not present

## 2018-01-23 DIAGNOSIS — I129 Hypertensive chronic kidney disease with stage 1 through stage 4 chronic kidney disease, or unspecified chronic kidney disease: Secondary | ICD-10-CM | POA: Diagnosis not present

## 2018-01-23 DIAGNOSIS — I5032 Chronic diastolic (congestive) heart failure: Secondary | ICD-10-CM | POA: Diagnosis not present

## 2018-01-23 DIAGNOSIS — E1122 Type 2 diabetes mellitus with diabetic chronic kidney disease: Secondary | ICD-10-CM | POA: Diagnosis not present

## 2018-01-30 DIAGNOSIS — I5032 Chronic diastolic (congestive) heart failure: Secondary | ICD-10-CM | POA: Diagnosis not present

## 2018-01-30 DIAGNOSIS — E1122 Type 2 diabetes mellitus with diabetic chronic kidney disease: Secondary | ICD-10-CM | POA: Diagnosis not present

## 2018-01-30 DIAGNOSIS — I129 Hypertensive chronic kidney disease with stage 1 through stage 4 chronic kidney disease, or unspecified chronic kidney disease: Secondary | ICD-10-CM | POA: Diagnosis not present

## 2018-01-30 DIAGNOSIS — K219 Gastro-esophageal reflux disease without esophagitis: Secondary | ICD-10-CM | POA: Diagnosis not present

## 2018-02-03 DIAGNOSIS — M0589 Other rheumatoid arthritis with rheumatoid factor of multiple sites: Secondary | ICD-10-CM | POA: Diagnosis not present

## 2018-02-24 DIAGNOSIS — E1149 Type 2 diabetes mellitus with other diabetic neurological complication: Secondary | ICD-10-CM | POA: Diagnosis not present

## 2018-02-24 DIAGNOSIS — N184 Chronic kidney disease, stage 4 (severe): Secondary | ICD-10-CM | POA: Diagnosis not present

## 2018-02-24 DIAGNOSIS — I129 Hypertensive chronic kidney disease with stage 1 through stage 4 chronic kidney disease, or unspecified chronic kidney disease: Secondary | ICD-10-CM | POA: Diagnosis not present

## 2018-02-24 DIAGNOSIS — D519 Vitamin B12 deficiency anemia, unspecified: Secondary | ICD-10-CM | POA: Diagnosis not present

## 2018-02-24 DIAGNOSIS — E1169 Type 2 diabetes mellitus with other specified complication: Secondary | ICD-10-CM | POA: Diagnosis not present

## 2018-02-24 DIAGNOSIS — E1122 Type 2 diabetes mellitus with diabetic chronic kidney disease: Secondary | ICD-10-CM | POA: Diagnosis not present

## 2018-02-25 DIAGNOSIS — R300 Dysuria: Secondary | ICD-10-CM | POA: Diagnosis not present

## 2018-02-25 DIAGNOSIS — N189 Chronic kidney disease, unspecified: Secondary | ICD-10-CM | POA: Diagnosis not present

## 2018-02-25 DIAGNOSIS — Z79899 Other long term (current) drug therapy: Secondary | ICD-10-CM | POA: Diagnosis not present

## 2018-02-25 DIAGNOSIS — H1859 Other hereditary corneal dystrophies: Secondary | ICD-10-CM | POA: Diagnosis not present

## 2018-02-25 DIAGNOSIS — E1122 Type 2 diabetes mellitus with diabetic chronic kidney disease: Secondary | ICD-10-CM | POA: Diagnosis not present

## 2018-02-25 DIAGNOSIS — H35033 Hypertensive retinopathy, bilateral: Secondary | ICD-10-CM | POA: Diagnosis not present

## 2018-02-25 DIAGNOSIS — D631 Anemia in chronic kidney disease: Secondary | ICD-10-CM | POA: Diagnosis not present

## 2018-03-04 DIAGNOSIS — E782 Mixed hyperlipidemia: Secondary | ICD-10-CM | POA: Diagnosis not present

## 2018-03-04 DIAGNOSIS — N184 Chronic kidney disease, stage 4 (severe): Secondary | ICD-10-CM | POA: Diagnosis not present

## 2018-03-04 DIAGNOSIS — I129 Hypertensive chronic kidney disease with stage 1 through stage 4 chronic kidney disease, or unspecified chronic kidney disease: Secondary | ICD-10-CM | POA: Diagnosis not present

## 2018-03-04 DIAGNOSIS — E1122 Type 2 diabetes mellitus with diabetic chronic kidney disease: Secondary | ICD-10-CM | POA: Diagnosis not present

## 2018-03-06 DIAGNOSIS — N390 Urinary tract infection, site not specified: Secondary | ICD-10-CM | POA: Diagnosis not present

## 2018-03-06 DIAGNOSIS — N183 Chronic kidney disease, stage 3 (moderate): Secondary | ICD-10-CM | POA: Diagnosis not present

## 2018-03-06 DIAGNOSIS — D631 Anemia in chronic kidney disease: Secondary | ICD-10-CM | POA: Diagnosis not present

## 2018-03-06 DIAGNOSIS — I129 Hypertensive chronic kidney disease with stage 1 through stage 4 chronic kidney disease, or unspecified chronic kidney disease: Secondary | ICD-10-CM | POA: Diagnosis not present

## 2018-03-06 DIAGNOSIS — N2581 Secondary hyperparathyroidism of renal origin: Secondary | ICD-10-CM | POA: Diagnosis not present

## 2018-03-06 DIAGNOSIS — N189 Chronic kidney disease, unspecified: Secondary | ICD-10-CM | POA: Diagnosis not present

## 2018-03-12 DIAGNOSIS — H1859 Other hereditary corneal dystrophies: Secondary | ICD-10-CM | POA: Diagnosis not present

## 2018-03-13 DIAGNOSIS — L84 Corns and callosities: Secondary | ICD-10-CM | POA: Diagnosis not present

## 2018-03-13 DIAGNOSIS — E1142 Type 2 diabetes mellitus with diabetic polyneuropathy: Secondary | ICD-10-CM | POA: Diagnosis not present

## 2018-03-25 DIAGNOSIS — D631 Anemia in chronic kidney disease: Secondary | ICD-10-CM | POA: Diagnosis not present

## 2018-03-25 DIAGNOSIS — N189 Chronic kidney disease, unspecified: Secondary | ICD-10-CM | POA: Diagnosis not present

## 2018-04-03 DIAGNOSIS — M0589 Other rheumatoid arthritis with rheumatoid factor of multiple sites: Secondary | ICD-10-CM | POA: Diagnosis not present

## 2018-04-04 DIAGNOSIS — E1122 Type 2 diabetes mellitus with diabetic chronic kidney disease: Secondary | ICD-10-CM | POA: Diagnosis not present

## 2018-04-04 DIAGNOSIS — N184 Chronic kidney disease, stage 4 (severe): Secondary | ICD-10-CM | POA: Diagnosis not present

## 2018-04-09 ENCOUNTER — Telehealth (INDEPENDENT_AMBULATORY_CARE_PROVIDER_SITE_OTHER): Payer: Self-pay | Admitting: Specialist

## 2018-04-09 NOTE — Telephone Encounter (Signed)
I called and advised pt that she has not been seen since 06/2017 and that she would need to be seen, also I dont see where we had rx'd any pain meds. She will make an appt to see Dr. Louanne Skye

## 2018-04-09 NOTE — Telephone Encounter (Signed)
Patient returned call asked for a call back on her cell number   [phone]

## 2018-04-09 NOTE — Telephone Encounter (Signed)
Patient requesting rx refill on hydrocodone. Patients # [phone]

## 2018-04-10 DIAGNOSIS — Z6832 Body mass index (BMI) 32.0-32.9, adult: Secondary | ICD-10-CM | POA: Diagnosis not present

## 2018-04-10 DIAGNOSIS — L0291 Cutaneous abscess, unspecified: Secondary | ICD-10-CM | POA: Diagnosis not present

## 2018-04-10 DIAGNOSIS — I5032 Chronic diastolic (congestive) heart failure: Secondary | ICD-10-CM | POA: Diagnosis not present

## 2018-04-10 DIAGNOSIS — M069 Rheumatoid arthritis, unspecified: Secondary | ICD-10-CM | POA: Diagnosis not present

## 2018-04-23 DIAGNOSIS — M069 Rheumatoid arthritis, unspecified: Secondary | ICD-10-CM | POA: Diagnosis not present

## 2018-04-23 DIAGNOSIS — D649 Anemia, unspecified: Secondary | ICD-10-CM

## 2018-04-23 DIAGNOSIS — I509 Heart failure, unspecified: Secondary | ICD-10-CM

## 2018-04-23 DIAGNOSIS — E119 Type 2 diabetes mellitus without complications: Secondary | ICD-10-CM | POA: Diagnosis not present

## 2018-04-23 DIAGNOSIS — E538 Deficiency of other specified B group vitamins: Secondary | ICD-10-CM

## 2018-04-23 DIAGNOSIS — D631 Anemia in chronic kidney disease: Secondary | ICD-10-CM | POA: Diagnosis not present

## 2018-04-23 DIAGNOSIS — D638 Anemia in other chronic diseases classified elsewhere: Secondary | ICD-10-CM | POA: Diagnosis not present

## 2018-04-23 DIAGNOSIS — N189 Chronic kidney disease, unspecified: Secondary | ICD-10-CM

## 2018-04-23 DIAGNOSIS — L989 Disorder of the skin and subcutaneous tissue, unspecified: Secondary | ICD-10-CM

## 2018-04-25 DIAGNOSIS — Z6832 Body mass index (BMI) 32.0-32.9, adult: Secondary | ICD-10-CM | POA: Diagnosis not present

## 2018-04-25 DIAGNOSIS — K219 Gastro-esophageal reflux disease without esophagitis: Secondary | ICD-10-CM | POA: Diagnosis not present

## 2018-04-25 DIAGNOSIS — I5032 Chronic diastolic (congestive) heart failure: Secondary | ICD-10-CM | POA: Diagnosis not present

## 2018-04-25 DIAGNOSIS — Z139 Encounter for screening, unspecified: Secondary | ICD-10-CM | POA: Diagnosis not present

## 2018-04-28 DIAGNOSIS — I5032 Chronic diastolic (congestive) heart failure: Secondary | ICD-10-CM | POA: Diagnosis not present

## 2018-04-28 DIAGNOSIS — E669 Obesity, unspecified: Secondary | ICD-10-CM | POA: Diagnosis not present

## 2018-04-28 DIAGNOSIS — N184 Chronic kidney disease, stage 4 (severe): Secondary | ICD-10-CM | POA: Diagnosis not present

## 2018-04-28 DIAGNOSIS — Z6832 Body mass index (BMI) 32.0-32.9, adult: Secondary | ICD-10-CM | POA: Diagnosis not present

## 2018-05-01 DIAGNOSIS — M0589 Other rheumatoid arthritis with rheumatoid factor of multiple sites: Secondary | ICD-10-CM | POA: Diagnosis not present

## 2018-05-04 DIAGNOSIS — N184 Chronic kidney disease, stage 4 (severe): Secondary | ICD-10-CM | POA: Diagnosis not present

## 2018-05-04 DIAGNOSIS — I5032 Chronic diastolic (congestive) heart failure: Secondary | ICD-10-CM | POA: Diagnosis not present

## 2018-05-04 DIAGNOSIS — I129 Hypertensive chronic kidney disease with stage 1 through stage 4 chronic kidney disease, or unspecified chronic kidney disease: Secondary | ICD-10-CM | POA: Diagnosis not present

## 2018-05-04 DIAGNOSIS — E1122 Type 2 diabetes mellitus with diabetic chronic kidney disease: Secondary | ICD-10-CM | POA: Diagnosis not present

## 2018-05-05 DIAGNOSIS — E669 Obesity, unspecified: Secondary | ICD-10-CM | POA: Diagnosis not present

## 2018-05-05 DIAGNOSIS — I5032 Chronic diastolic (congestive) heart failure: Secondary | ICD-10-CM | POA: Diagnosis not present

## 2018-05-05 DIAGNOSIS — Z6831 Body mass index (BMI) 31.0-31.9, adult: Secondary | ICD-10-CM | POA: Diagnosis not present

## 2018-05-05 DIAGNOSIS — N390 Urinary tract infection, site not specified: Secondary | ICD-10-CM | POA: Diagnosis not present

## 2018-05-07 DIAGNOSIS — C44529 Squamous cell carcinoma of skin of other part of trunk: Secondary | ICD-10-CM | POA: Diagnosis not present

## 2018-05-07 DIAGNOSIS — L578 Other skin changes due to chronic exposure to nonionizing radiation: Secondary | ICD-10-CM | POA: Diagnosis not present

## 2018-05-21 DIAGNOSIS — N189 Chronic kidney disease, unspecified: Secondary | ICD-10-CM | POA: Diagnosis not present

## 2018-05-21 DIAGNOSIS — D631 Anemia in chronic kidney disease: Secondary | ICD-10-CM | POA: Diagnosis not present

## 2018-05-27 DIAGNOSIS — C44529 Squamous cell carcinoma of skin of other part of trunk: Secondary | ICD-10-CM | POA: Diagnosis not present

## 2018-06-04 DIAGNOSIS — E1122 Type 2 diabetes mellitus with diabetic chronic kidney disease: Secondary | ICD-10-CM | POA: Diagnosis not present

## 2018-06-04 DIAGNOSIS — I129 Hypertensive chronic kidney disease with stage 1 through stage 4 chronic kidney disease, or unspecified chronic kidney disease: Secondary | ICD-10-CM | POA: Diagnosis not present

## 2018-06-04 DIAGNOSIS — N184 Chronic kidney disease, stage 4 (severe): Secondary | ICD-10-CM | POA: Diagnosis not present

## 2018-06-04 DIAGNOSIS — I5032 Chronic diastolic (congestive) heart failure: Secondary | ICD-10-CM | POA: Diagnosis not present

## 2018-06-05 ENCOUNTER — Other Ambulatory Visit: Payer: Self-pay

## 2018-06-16 DIAGNOSIS — E1122 Type 2 diabetes mellitus with diabetic chronic kidney disease: Secondary | ICD-10-CM | POA: Diagnosis not present

## 2018-06-16 DIAGNOSIS — E1149 Type 2 diabetes mellitus with other diabetic neurological complication: Secondary | ICD-10-CM | POA: Diagnosis not present

## 2018-06-16 DIAGNOSIS — E1169 Type 2 diabetes mellitus with other specified complication: Secondary | ICD-10-CM | POA: Diagnosis not present

## 2018-06-16 DIAGNOSIS — D638 Anemia in other chronic diseases classified elsewhere: Secondary | ICD-10-CM | POA: Diagnosis not present

## 2018-06-18 DIAGNOSIS — D631 Anemia in chronic kidney disease: Secondary | ICD-10-CM | POA: Diagnosis not present

## 2018-06-18 DIAGNOSIS — N189 Chronic kidney disease, unspecified: Secondary | ICD-10-CM | POA: Diagnosis not present

## 2018-06-23 DIAGNOSIS — Z139 Encounter for screening, unspecified: Secondary | ICD-10-CM | POA: Diagnosis not present

## 2018-06-23 DIAGNOSIS — I129 Hypertensive chronic kidney disease with stage 1 through stage 4 chronic kidney disease, or unspecified chronic kidney disease: Secondary | ICD-10-CM | POA: Diagnosis not present

## 2018-06-23 DIAGNOSIS — Z1331 Encounter for screening for depression: Secondary | ICD-10-CM | POA: Diagnosis not present

## 2018-06-23 DIAGNOSIS — Z9181 History of falling: Secondary | ICD-10-CM | POA: Diagnosis not present

## 2018-06-23 DIAGNOSIS — Z0000 Encounter for general adult medical examination without abnormal findings: Secondary | ICD-10-CM | POA: Diagnosis not present

## 2018-06-23 DIAGNOSIS — E1122 Type 2 diabetes mellitus with diabetic chronic kidney disease: Secondary | ICD-10-CM | POA: Diagnosis not present

## 2018-06-23 DIAGNOSIS — E1169 Type 2 diabetes mellitus with other specified complication: Secondary | ICD-10-CM | POA: Diagnosis not present

## 2018-06-23 DIAGNOSIS — Z23 Encounter for immunization: Secondary | ICD-10-CM | POA: Diagnosis not present

## 2018-06-23 DIAGNOSIS — N184 Chronic kidney disease, stage 4 (severe): Secondary | ICD-10-CM | POA: Diagnosis not present

## 2018-06-26 DIAGNOSIS — Z1231 Encounter for screening mammogram for malignant neoplasm of breast: Secondary | ICD-10-CM | POA: Diagnosis not present

## 2018-06-30 DIAGNOSIS — N184 Chronic kidney disease, stage 4 (severe): Secondary | ICD-10-CM | POA: Diagnosis not present

## 2018-06-30 DIAGNOSIS — I5032 Chronic diastolic (congestive) heart failure: Secondary | ICD-10-CM | POA: Diagnosis not present

## 2018-06-30 DIAGNOSIS — Z6832 Body mass index (BMI) 32.0-32.9, adult: Secondary | ICD-10-CM | POA: Diagnosis not present

## 2018-07-04 DIAGNOSIS — I129 Hypertensive chronic kidney disease with stage 1 through stage 4 chronic kidney disease, or unspecified chronic kidney disease: Secondary | ICD-10-CM | POA: Diagnosis not present

## 2018-07-04 DIAGNOSIS — E1122 Type 2 diabetes mellitus with diabetic chronic kidney disease: Secondary | ICD-10-CM | POA: Diagnosis not present

## 2018-07-04 DIAGNOSIS — N184 Chronic kidney disease, stage 4 (severe): Secondary | ICD-10-CM | POA: Diagnosis not present

## 2018-07-04 DIAGNOSIS — E1169 Type 2 diabetes mellitus with other specified complication: Secondary | ICD-10-CM | POA: Diagnosis not present

## 2018-07-14 DIAGNOSIS — I5032 Chronic diastolic (congestive) heart failure: Secondary | ICD-10-CM | POA: Diagnosis not present

## 2018-07-14 DIAGNOSIS — Z6832 Body mass index (BMI) 32.0-32.9, adult: Secondary | ICD-10-CM | POA: Diagnosis not present

## 2018-07-17 DIAGNOSIS — M0579 Rheumatoid arthritis with rheumatoid factor of multiple sites without organ or systems involvement: Secondary | ICD-10-CM | POA: Diagnosis not present

## 2018-07-17 DIAGNOSIS — E669 Obesity, unspecified: Secondary | ICD-10-CM | POA: Diagnosis not present

## 2018-07-17 DIAGNOSIS — M150 Primary generalized (osteo)arthritis: Secondary | ICD-10-CM | POA: Diagnosis not present

## 2018-07-17 DIAGNOSIS — Z6830 Body mass index (BMI) 30.0-30.9, adult: Secondary | ICD-10-CM | POA: Diagnosis not present

## 2018-07-17 DIAGNOSIS — M797 Fibromyalgia: Secondary | ICD-10-CM | POA: Diagnosis not present

## 2018-07-17 DIAGNOSIS — R7612 Nonspecific reaction to cell mediated immunity measurement of gamma interferon antigen response without active tuberculosis: Secondary | ICD-10-CM | POA: Diagnosis not present

## 2018-07-22 DIAGNOSIS — D649 Anemia, unspecified: Secondary | ICD-10-CM | POA: Diagnosis not present

## 2018-07-22 DIAGNOSIS — I509 Heart failure, unspecified: Secondary | ICD-10-CM | POA: Diagnosis not present

## 2018-07-22 DIAGNOSIS — E538 Deficiency of other specified B group vitamins: Secondary | ICD-10-CM | POA: Diagnosis not present

## 2018-07-30 DIAGNOSIS — M0589 Other rheumatoid arthritis with rheumatoid factor of multiple sites: Secondary | ICD-10-CM | POA: Diagnosis not present

## 2018-08-04 DIAGNOSIS — I129 Hypertensive chronic kidney disease with stage 1 through stage 4 chronic kidney disease, or unspecified chronic kidney disease: Secondary | ICD-10-CM | POA: Diagnosis not present

## 2018-08-04 DIAGNOSIS — E1122 Type 2 diabetes mellitus with diabetic chronic kidney disease: Secondary | ICD-10-CM | POA: Diagnosis not present

## 2018-08-04 DIAGNOSIS — D631 Anemia in chronic kidney disease: Secondary | ICD-10-CM | POA: Diagnosis not present

## 2018-08-04 DIAGNOSIS — N2581 Secondary hyperparathyroidism of renal origin: Secondary | ICD-10-CM | POA: Diagnosis not present

## 2018-08-04 DIAGNOSIS — N184 Chronic kidney disease, stage 4 (severe): Secondary | ICD-10-CM | POA: Diagnosis not present

## 2018-08-04 DIAGNOSIS — I5032 Chronic diastolic (congestive) heart failure: Secondary | ICD-10-CM | POA: Diagnosis not present

## 2018-08-19 DIAGNOSIS — D518 Other vitamin B12 deficiency anemias: Secondary | ICD-10-CM | POA: Diagnosis not present

## 2018-08-25 DIAGNOSIS — I5032 Chronic diastolic (congestive) heart failure: Secondary | ICD-10-CM | POA: Diagnosis not present

## 2018-08-25 DIAGNOSIS — K219 Gastro-esophageal reflux disease without esophagitis: Secondary | ICD-10-CM | POA: Diagnosis not present

## 2018-08-25 DIAGNOSIS — Z6832 Body mass index (BMI) 32.0-32.9, adult: Secondary | ICD-10-CM | POA: Diagnosis not present

## 2018-08-25 DIAGNOSIS — Z139 Encounter for screening, unspecified: Secondary | ICD-10-CM | POA: Diagnosis not present

## 2018-08-27 DIAGNOSIS — M0589 Other rheumatoid arthritis with rheumatoid factor of multiple sites: Secondary | ICD-10-CM | POA: Diagnosis not present

## 2018-09-03 DIAGNOSIS — I5032 Chronic diastolic (congestive) heart failure: Secondary | ICD-10-CM | POA: Diagnosis not present

## 2018-09-03 DIAGNOSIS — I509 Heart failure, unspecified: Secondary | ICD-10-CM | POA: Diagnosis not present

## 2018-09-03 DIAGNOSIS — N184 Chronic kidney disease, stage 4 (severe): Secondary | ICD-10-CM | POA: Diagnosis not present

## 2018-09-04 DIAGNOSIS — I509 Heart failure, unspecified: Secondary | ICD-10-CM | POA: Diagnosis not present

## 2018-09-04 DIAGNOSIS — E1122 Type 2 diabetes mellitus with diabetic chronic kidney disease: Secondary | ICD-10-CM | POA: Diagnosis not present

## 2018-09-04 DIAGNOSIS — I5032 Chronic diastolic (congestive) heart failure: Secondary | ICD-10-CM | POA: Diagnosis not present

## 2018-09-04 DIAGNOSIS — R05 Cough: Secondary | ICD-10-CM | POA: Diagnosis not present

## 2018-09-04 DIAGNOSIS — I129 Hypertensive chronic kidney disease with stage 1 through stage 4 chronic kidney disease, or unspecified chronic kidney disease: Secondary | ICD-10-CM | POA: Diagnosis not present

## 2018-09-04 DIAGNOSIS — J441 Chronic obstructive pulmonary disease with (acute) exacerbation: Secondary | ICD-10-CM | POA: Diagnosis not present

## 2018-09-08 DIAGNOSIS — I5032 Chronic diastolic (congestive) heart failure: Secondary | ICD-10-CM | POA: Diagnosis not present

## 2018-09-08 DIAGNOSIS — J441 Chronic obstructive pulmonary disease with (acute) exacerbation: Secondary | ICD-10-CM | POA: Diagnosis not present

## 2018-09-08 DIAGNOSIS — Z6832 Body mass index (BMI) 32.0-32.9, adult: Secondary | ICD-10-CM | POA: Diagnosis not present

## 2018-09-15 DIAGNOSIS — Z6832 Body mass index (BMI) 32.0-32.9, adult: Secondary | ICD-10-CM | POA: Diagnosis not present

## 2018-09-15 DIAGNOSIS — N189 Chronic kidney disease, unspecified: Secondary | ICD-10-CM | POA: Diagnosis not present

## 2018-09-15 DIAGNOSIS — D631 Anemia in chronic kidney disease: Secondary | ICD-10-CM | POA: Diagnosis not present

## 2018-09-15 DIAGNOSIS — I5032 Chronic diastolic (congestive) heart failure: Secondary | ICD-10-CM | POA: Diagnosis not present

## 2018-09-16 DIAGNOSIS — D518 Other vitamin B12 deficiency anemias: Secondary | ICD-10-CM | POA: Diagnosis not present

## 2018-09-22 ENCOUNTER — Other Ambulatory Visit: Payer: Self-pay

## 2018-09-22 DIAGNOSIS — E1169 Type 2 diabetes mellitus with other specified complication: Secondary | ICD-10-CM | POA: Diagnosis not present

## 2018-09-22 DIAGNOSIS — N189 Chronic kidney disease, unspecified: Secondary | ICD-10-CM | POA: Diagnosis not present

## 2018-09-22 DIAGNOSIS — E1122 Type 2 diabetes mellitus with diabetic chronic kidney disease: Secondary | ICD-10-CM | POA: Diagnosis not present

## 2018-09-24 DIAGNOSIS — M0589 Other rheumatoid arthritis with rheumatoid factor of multiple sites: Secondary | ICD-10-CM | POA: Diagnosis not present

## 2018-09-29 DIAGNOSIS — E1122 Type 2 diabetes mellitus with diabetic chronic kidney disease: Secondary | ICD-10-CM | POA: Diagnosis not present

## 2018-09-29 DIAGNOSIS — Z139 Encounter for screening, unspecified: Secondary | ICD-10-CM | POA: Diagnosis not present

## 2018-09-29 DIAGNOSIS — I129 Hypertensive chronic kidney disease with stage 1 through stage 4 chronic kidney disease, or unspecified chronic kidney disease: Secondary | ICD-10-CM | POA: Diagnosis not present

## 2018-09-29 DIAGNOSIS — N189 Chronic kidney disease, unspecified: Secondary | ICD-10-CM | POA: Diagnosis not present

## 2018-09-29 DIAGNOSIS — N2581 Secondary hyperparathyroidism of renal origin: Secondary | ICD-10-CM | POA: Diagnosis not present

## 2018-09-29 DIAGNOSIS — N184 Chronic kidney disease, stage 4 (severe): Secondary | ICD-10-CM | POA: Diagnosis not present

## 2018-09-29 DIAGNOSIS — D631 Anemia in chronic kidney disease: Secondary | ICD-10-CM | POA: Diagnosis not present

## 2018-09-29 DIAGNOSIS — I5032 Chronic diastolic (congestive) heart failure: Secondary | ICD-10-CM | POA: Diagnosis not present

## 2018-10-04 DIAGNOSIS — D631 Anemia in chronic kidney disease: Secondary | ICD-10-CM | POA: Diagnosis not present

## 2018-10-04 DIAGNOSIS — E1122 Type 2 diabetes mellitus with diabetic chronic kidney disease: Secondary | ICD-10-CM | POA: Diagnosis not present

## 2018-10-04 DIAGNOSIS — N189 Chronic kidney disease, unspecified: Secondary | ICD-10-CM | POA: Diagnosis not present

## 2018-10-04 DIAGNOSIS — I5032 Chronic diastolic (congestive) heart failure: Secondary | ICD-10-CM | POA: Diagnosis not present

## 2018-10-14 DIAGNOSIS — N189 Chronic kidney disease, unspecified: Secondary | ICD-10-CM | POA: Diagnosis not present

## 2018-10-14 DIAGNOSIS — D649 Anemia, unspecified: Secondary | ICD-10-CM | POA: Diagnosis not present

## 2018-10-14 DIAGNOSIS — E538 Deficiency of other specified B group vitamins: Secondary | ICD-10-CM | POA: Diagnosis not present

## 2018-10-14 DIAGNOSIS — I509 Heart failure, unspecified: Secondary | ICD-10-CM | POA: Diagnosis not present

## 2018-10-14 DIAGNOSIS — D6489 Other specified anemias: Secondary | ICD-10-CM | POA: Diagnosis not present

## 2018-10-17 DIAGNOSIS — N189 Chronic kidney disease, unspecified: Secondary | ICD-10-CM | POA: Diagnosis not present

## 2018-10-17 DIAGNOSIS — Z139 Encounter for screening, unspecified: Secondary | ICD-10-CM | POA: Diagnosis not present

## 2018-10-17 DIAGNOSIS — D631 Anemia in chronic kidney disease: Secondary | ICD-10-CM | POA: Diagnosis not present

## 2018-10-17 DIAGNOSIS — N390 Urinary tract infection, site not specified: Secondary | ICD-10-CM | POA: Diagnosis not present

## 2018-10-17 DIAGNOSIS — I5032 Chronic diastolic (congestive) heart failure: Secondary | ICD-10-CM | POA: Diagnosis not present

## 2018-10-23 DIAGNOSIS — N189 Chronic kidney disease, unspecified: Secondary | ICD-10-CM | POA: Diagnosis not present

## 2018-10-23 DIAGNOSIS — D631 Anemia in chronic kidney disease: Secondary | ICD-10-CM | POA: Diagnosis not present

## 2018-10-23 DIAGNOSIS — Z6831 Body mass index (BMI) 31.0-31.9, adult: Secondary | ICD-10-CM | POA: Diagnosis not present

## 2018-10-23 DIAGNOSIS — I5032 Chronic diastolic (congestive) heart failure: Secondary | ICD-10-CM | POA: Diagnosis not present

## 2018-11-04 DIAGNOSIS — N184 Chronic kidney disease, stage 4 (severe): Secondary | ICD-10-CM | POA: Diagnosis not present

## 2018-11-04 DIAGNOSIS — J441 Chronic obstructive pulmonary disease with (acute) exacerbation: Secondary | ICD-10-CM | POA: Diagnosis not present

## 2018-11-04 DIAGNOSIS — D631 Anemia in chronic kidney disease: Secondary | ICD-10-CM | POA: Diagnosis not present

## 2018-11-04 DIAGNOSIS — I5032 Chronic diastolic (congestive) heart failure: Secondary | ICD-10-CM | POA: Diagnosis not present

## 2018-11-10 DIAGNOSIS — J441 Chronic obstructive pulmonary disease with (acute) exacerbation: Secondary | ICD-10-CM | POA: Diagnosis not present

## 2018-11-10 DIAGNOSIS — N184 Chronic kidney disease, stage 4 (severe): Secondary | ICD-10-CM | POA: Diagnosis not present

## 2018-11-10 DIAGNOSIS — E538 Deficiency of other specified B group vitamins: Secondary | ICD-10-CM | POA: Diagnosis not present

## 2018-11-10 DIAGNOSIS — I5032 Chronic diastolic (congestive) heart failure: Secondary | ICD-10-CM | POA: Diagnosis not present

## 2018-11-10 DIAGNOSIS — D509 Iron deficiency anemia, unspecified: Secondary | ICD-10-CM | POA: Diagnosis not present

## 2018-11-10 DIAGNOSIS — R05 Cough: Secondary | ICD-10-CM | POA: Diagnosis not present

## 2018-11-11 DIAGNOSIS — M0589 Other rheumatoid arthritis with rheumatoid factor of multiple sites: Secondary | ICD-10-CM | POA: Diagnosis not present

## 2018-11-14 DIAGNOSIS — N184 Chronic kidney disease, stage 4 (severe): Secondary | ICD-10-CM | POA: Diagnosis not present

## 2018-11-14 DIAGNOSIS — I129 Hypertensive chronic kidney disease with stage 1 through stage 4 chronic kidney disease, or unspecified chronic kidney disease: Secondary | ICD-10-CM | POA: Diagnosis not present

## 2018-11-14 DIAGNOSIS — E1122 Type 2 diabetes mellitus with diabetic chronic kidney disease: Secondary | ICD-10-CM | POA: Diagnosis not present

## 2018-11-14 DIAGNOSIS — I5032 Chronic diastolic (congestive) heart failure: Secondary | ICD-10-CM | POA: Diagnosis not present

## 2018-11-14 DIAGNOSIS — J441 Chronic obstructive pulmonary disease with (acute) exacerbation: Secondary | ICD-10-CM | POA: Diagnosis not present

## 2018-11-14 DIAGNOSIS — D509 Iron deficiency anemia, unspecified: Secondary | ICD-10-CM | POA: Diagnosis not present

## 2018-11-17 DIAGNOSIS — I5032 Chronic diastolic (congestive) heart failure: Secondary | ICD-10-CM | POA: Diagnosis not present

## 2018-11-17 DIAGNOSIS — J441 Chronic obstructive pulmonary disease with (acute) exacerbation: Secondary | ICD-10-CM | POA: Diagnosis not present

## 2018-11-17 DIAGNOSIS — Z139 Encounter for screening, unspecified: Secondary | ICD-10-CM | POA: Diagnosis not present

## 2018-11-17 DIAGNOSIS — Z6832 Body mass index (BMI) 32.0-32.9, adult: Secondary | ICD-10-CM | POA: Diagnosis not present

## 2018-11-25 DIAGNOSIS — Z6832 Body mass index (BMI) 32.0-32.9, adult: Secondary | ICD-10-CM | POA: Diagnosis not present

## 2018-11-25 DIAGNOSIS — J441 Chronic obstructive pulmonary disease with (acute) exacerbation: Secondary | ICD-10-CM | POA: Diagnosis not present

## 2018-12-01 DIAGNOSIS — E1149 Type 2 diabetes mellitus with other diabetic neurological complication: Secondary | ICD-10-CM | POA: Diagnosis not present

## 2018-12-01 DIAGNOSIS — Z139 Encounter for screening, unspecified: Secondary | ICD-10-CM | POA: Diagnosis not present

## 2018-12-01 DIAGNOSIS — J441 Chronic obstructive pulmonary disease with (acute) exacerbation: Secondary | ICD-10-CM | POA: Diagnosis not present

## 2018-12-01 DIAGNOSIS — I5032 Chronic diastolic (congestive) heart failure: Secondary | ICD-10-CM | POA: Diagnosis not present

## 2018-12-05 DIAGNOSIS — I5032 Chronic diastolic (congestive) heart failure: Secondary | ICD-10-CM | POA: Diagnosis not present

## 2018-12-05 DIAGNOSIS — I129 Hypertensive chronic kidney disease with stage 1 through stage 4 chronic kidney disease, or unspecified chronic kidney disease: Secondary | ICD-10-CM | POA: Diagnosis not present

## 2018-12-05 DIAGNOSIS — E1122 Type 2 diabetes mellitus with diabetic chronic kidney disease: Secondary | ICD-10-CM | POA: Diagnosis not present

## 2018-12-05 DIAGNOSIS — N184 Chronic kidney disease, stage 4 (severe): Secondary | ICD-10-CM | POA: Diagnosis not present

## 2018-12-09 DIAGNOSIS — D519 Vitamin B12 deficiency anemia, unspecified: Secondary | ICD-10-CM | POA: Diagnosis not present

## 2018-12-11 DIAGNOSIS — E1122 Type 2 diabetes mellitus with diabetic chronic kidney disease: Secondary | ICD-10-CM | POA: Diagnosis not present

## 2018-12-11 DIAGNOSIS — E1169 Type 2 diabetes mellitus with other specified complication: Secondary | ICD-10-CM | POA: Diagnosis not present

## 2018-12-11 DIAGNOSIS — I5032 Chronic diastolic (congestive) heart failure: Secondary | ICD-10-CM | POA: Diagnosis not present

## 2018-12-11 DIAGNOSIS — N184 Chronic kidney disease, stage 4 (severe): Secondary | ICD-10-CM | POA: Diagnosis not present

## 2018-12-11 DIAGNOSIS — I129 Hypertensive chronic kidney disease with stage 1 through stage 4 chronic kidney disease, or unspecified chronic kidney disease: Secondary | ICD-10-CM | POA: Diagnosis not present

## 2018-12-16 DIAGNOSIS — Z6831 Body mass index (BMI) 31.0-31.9, adult: Secondary | ICD-10-CM | POA: Diagnosis not present

## 2018-12-16 DIAGNOSIS — R319 Hematuria, unspecified: Secondary | ICD-10-CM | POA: Diagnosis not present

## 2018-12-18 DIAGNOSIS — I5032 Chronic diastolic (congestive) heart failure: Secondary | ICD-10-CM | POA: Diagnosis not present

## 2018-12-18 DIAGNOSIS — K219 Gastro-esophageal reflux disease without esophagitis: Secondary | ICD-10-CM | POA: Diagnosis not present

## 2018-12-18 DIAGNOSIS — Z6832 Body mass index (BMI) 32.0-32.9, adult: Secondary | ICD-10-CM | POA: Diagnosis not present

## 2018-12-23 DIAGNOSIS — M0589 Other rheumatoid arthritis with rheumatoid factor of multiple sites: Secondary | ICD-10-CM | POA: Diagnosis not present

## 2018-12-23 DIAGNOSIS — Z79899 Other long term (current) drug therapy: Secondary | ICD-10-CM | POA: Diagnosis not present

## 2018-12-25 DIAGNOSIS — I5032 Chronic diastolic (congestive) heart failure: Secondary | ICD-10-CM | POA: Diagnosis not present

## 2018-12-25 DIAGNOSIS — Z6831 Body mass index (BMI) 31.0-31.9, adult: Secondary | ICD-10-CM | POA: Diagnosis not present

## 2019-01-02 DIAGNOSIS — N184 Chronic kidney disease, stage 4 (severe): Secondary | ICD-10-CM | POA: Diagnosis not present

## 2019-01-02 DIAGNOSIS — Z6832 Body mass index (BMI) 32.0-32.9, adult: Secondary | ICD-10-CM | POA: Diagnosis not present

## 2019-01-02 DIAGNOSIS — K219 Gastro-esophageal reflux disease without esophagitis: Secondary | ICD-10-CM | POA: Diagnosis not present

## 2019-01-02 DIAGNOSIS — I5032 Chronic diastolic (congestive) heart failure: Secondary | ICD-10-CM | POA: Diagnosis not present

## 2019-01-02 DIAGNOSIS — I129 Hypertensive chronic kidney disease with stage 1 through stage 4 chronic kidney disease, or unspecified chronic kidney disease: Secondary | ICD-10-CM | POA: Diagnosis not present

## 2019-01-06 DIAGNOSIS — D509 Iron deficiency anemia, unspecified: Secondary | ICD-10-CM | POA: Diagnosis not present

## 2019-01-06 DIAGNOSIS — E538 Deficiency of other specified B group vitamins: Secondary | ICD-10-CM | POA: Diagnosis not present

## 2019-01-06 DIAGNOSIS — N189 Chronic kidney disease, unspecified: Secondary | ICD-10-CM | POA: Diagnosis not present

## 2019-01-07 DIAGNOSIS — N189 Chronic kidney disease, unspecified: Secondary | ICD-10-CM | POA: Diagnosis not present

## 2019-01-07 DIAGNOSIS — D631 Anemia in chronic kidney disease: Secondary | ICD-10-CM | POA: Diagnosis not present

## 2019-01-07 DIAGNOSIS — E538 Deficiency of other specified B group vitamins: Secondary | ICD-10-CM | POA: Diagnosis not present

## 2019-01-15 DIAGNOSIS — M797 Fibromyalgia: Secondary | ICD-10-CM | POA: Diagnosis not present

## 2019-01-15 DIAGNOSIS — Z6832 Body mass index (BMI) 32.0-32.9, adult: Secondary | ICD-10-CM | POA: Diagnosis not present

## 2019-01-15 DIAGNOSIS — R7612 Nonspecific reaction to cell mediated immunity measurement of gamma interferon antigen response without active tuberculosis: Secondary | ICD-10-CM | POA: Diagnosis not present

## 2019-01-15 DIAGNOSIS — M0579 Rheumatoid arthritis with rheumatoid factor of multiple sites without organ or systems involvement: Secondary | ICD-10-CM | POA: Diagnosis not present

## 2019-01-15 DIAGNOSIS — E669 Obesity, unspecified: Secondary | ICD-10-CM | POA: Diagnosis not present

## 2019-01-15 DIAGNOSIS — M150 Primary generalized (osteo)arthritis: Secondary | ICD-10-CM | POA: Diagnosis not present

## 2019-01-19 DIAGNOSIS — N189 Chronic kidney disease, unspecified: Secondary | ICD-10-CM | POA: Diagnosis not present

## 2019-01-19 DIAGNOSIS — N2581 Secondary hyperparathyroidism of renal origin: Secondary | ICD-10-CM | POA: Diagnosis not present

## 2019-01-19 DIAGNOSIS — D631 Anemia in chronic kidney disease: Secondary | ICD-10-CM | POA: Diagnosis not present

## 2019-01-19 DIAGNOSIS — I129 Hypertensive chronic kidney disease with stage 1 through stage 4 chronic kidney disease, or unspecified chronic kidney disease: Secondary | ICD-10-CM | POA: Diagnosis not present

## 2019-01-19 DIAGNOSIS — N184 Chronic kidney disease, stage 4 (severe): Secondary | ICD-10-CM | POA: Diagnosis not present

## 2019-01-20 DIAGNOSIS — M0589 Other rheumatoid arthritis with rheumatoid factor of multiple sites: Secondary | ICD-10-CM | POA: Diagnosis not present

## 2019-01-30 DIAGNOSIS — N189 Chronic kidney disease, unspecified: Secondary | ICD-10-CM | POA: Diagnosis not present

## 2019-01-30 DIAGNOSIS — R319 Hematuria, unspecified: Secondary | ICD-10-CM | POA: Diagnosis not present

## 2019-01-30 DIAGNOSIS — I5032 Chronic diastolic (congestive) heart failure: Secondary | ICD-10-CM | POA: Diagnosis not present

## 2019-01-30 DIAGNOSIS — D631 Anemia in chronic kidney disease: Secondary | ICD-10-CM | POA: Diagnosis not present

## 2019-02-02 DIAGNOSIS — D631 Anemia in chronic kidney disease: Secondary | ICD-10-CM

## 2019-02-02 DIAGNOSIS — D649 Anemia, unspecified: Secondary | ICD-10-CM | POA: Diagnosis not present

## 2019-02-02 DIAGNOSIS — N189 Chronic kidney disease, unspecified: Secondary | ICD-10-CM

## 2019-02-02 DIAGNOSIS — E538 Deficiency of other specified B group vitamins: Secondary | ICD-10-CM | POA: Diagnosis not present

## 2019-02-03 DIAGNOSIS — I5032 Chronic diastolic (congestive) heart failure: Secondary | ICD-10-CM | POA: Diagnosis not present

## 2019-02-03 DIAGNOSIS — Z6833 Body mass index (BMI) 33.0-33.9, adult: Secondary | ICD-10-CM | POA: Diagnosis not present

## 2019-02-03 DIAGNOSIS — N184 Chronic kidney disease, stage 4 (severe): Secondary | ICD-10-CM | POA: Diagnosis not present

## 2019-02-03 DIAGNOSIS — N189 Chronic kidney disease, unspecified: Secondary | ICD-10-CM | POA: Diagnosis not present

## 2019-02-04 DIAGNOSIS — E538 Deficiency of other specified B group vitamins: Secondary | ICD-10-CM | POA: Diagnosis not present

## 2019-02-05 DIAGNOSIS — D631 Anemia in chronic kidney disease: Secondary | ICD-10-CM | POA: Diagnosis not present

## 2019-02-05 DIAGNOSIS — N189 Chronic kidney disease, unspecified: Secondary | ICD-10-CM | POA: Diagnosis not present

## 2019-02-05 DIAGNOSIS — Z6824 Body mass index (BMI) 24.0-24.9, adult: Secondary | ICD-10-CM | POA: Diagnosis not present

## 2019-02-05 DIAGNOSIS — I5032 Chronic diastolic (congestive) heart failure: Secondary | ICD-10-CM | POA: Diagnosis not present

## 2019-02-09 DIAGNOSIS — Z7189 Other specified counseling: Secondary | ICD-10-CM | POA: Diagnosis not present

## 2019-02-09 DIAGNOSIS — N184 Chronic kidney disease, stage 4 (severe): Secondary | ICD-10-CM | POA: Diagnosis not present

## 2019-02-09 DIAGNOSIS — I5032 Chronic diastolic (congestive) heart failure: Secondary | ICD-10-CM | POA: Diagnosis not present

## 2019-02-16 DIAGNOSIS — Z7189 Other specified counseling: Secondary | ICD-10-CM | POA: Diagnosis not present

## 2019-02-16 DIAGNOSIS — I5032 Chronic diastolic (congestive) heart failure: Secondary | ICD-10-CM | POA: Diagnosis not present

## 2019-02-16 DIAGNOSIS — M069 Rheumatoid arthritis, unspecified: Secondary | ICD-10-CM | POA: Diagnosis not present

## 2019-02-17 DIAGNOSIS — M0589 Other rheumatoid arthritis with rheumatoid factor of multiple sites: Secondary | ICD-10-CM | POA: Diagnosis not present

## 2019-02-23 DIAGNOSIS — Z7189 Other specified counseling: Secondary | ICD-10-CM | POA: Diagnosis not present

## 2019-02-23 DIAGNOSIS — M069 Rheumatoid arthritis, unspecified: Secondary | ICD-10-CM | POA: Diagnosis not present

## 2019-02-23 DIAGNOSIS — I5032 Chronic diastolic (congestive) heart failure: Secondary | ICD-10-CM | POA: Diagnosis not present

## 2019-03-03 DIAGNOSIS — N189 Chronic kidney disease, unspecified: Secondary | ICD-10-CM | POA: Diagnosis not present

## 2019-03-03 DIAGNOSIS — E538 Deficiency of other specified B group vitamins: Secondary | ICD-10-CM | POA: Diagnosis not present

## 2019-03-03 DIAGNOSIS — D631 Anemia in chronic kidney disease: Secondary | ICD-10-CM | POA: Diagnosis not present

## 2019-03-05 DIAGNOSIS — I5032 Chronic diastolic (congestive) heart failure: Secondary | ICD-10-CM | POA: Diagnosis not present

## 2019-03-05 DIAGNOSIS — N184 Chronic kidney disease, stage 4 (severe): Secondary | ICD-10-CM | POA: Diagnosis not present

## 2019-03-05 DIAGNOSIS — M069 Rheumatoid arthritis, unspecified: Secondary | ICD-10-CM | POA: Diagnosis not present

## 2019-03-13 DIAGNOSIS — I5032 Chronic diastolic (congestive) heart failure: Secondary | ICD-10-CM | POA: Diagnosis not present

## 2019-03-16 DIAGNOSIS — N189 Chronic kidney disease, unspecified: Secondary | ICD-10-CM | POA: Diagnosis not present

## 2019-03-16 DIAGNOSIS — I5032 Chronic diastolic (congestive) heart failure: Secondary | ICD-10-CM | POA: Diagnosis not present

## 2019-03-16 DIAGNOSIS — E1169 Type 2 diabetes mellitus with other specified complication: Secondary | ICD-10-CM | POA: Diagnosis not present

## 2019-03-16 DIAGNOSIS — D631 Anemia in chronic kidney disease: Secondary | ICD-10-CM | POA: Diagnosis not present

## 2019-03-16 DIAGNOSIS — E782 Mixed hyperlipidemia: Secondary | ICD-10-CM | POA: Diagnosis not present

## 2019-03-25 DIAGNOSIS — M0589 Other rheumatoid arthritis with rheumatoid factor of multiple sites: Secondary | ICD-10-CM | POA: Diagnosis not present

## 2019-03-26 DIAGNOSIS — I5032 Chronic diastolic (congestive) heart failure: Secondary | ICD-10-CM | POA: Diagnosis not present

## 2019-03-26 DIAGNOSIS — D72829 Elevated white blood cell count, unspecified: Secondary | ICD-10-CM | POA: Diagnosis not present

## 2019-03-26 DIAGNOSIS — D631 Anemia in chronic kidney disease: Secondary | ICD-10-CM | POA: Diagnosis not present

## 2019-03-26 DIAGNOSIS — N189 Chronic kidney disease, unspecified: Secondary | ICD-10-CM | POA: Diagnosis not present

## 2019-04-02 DIAGNOSIS — R3915 Urgency of urination: Secondary | ICD-10-CM | POA: Diagnosis not present

## 2019-04-02 DIAGNOSIS — N319 Neuromuscular dysfunction of bladder, unspecified: Secondary | ICD-10-CM | POA: Diagnosis not present

## 2019-04-02 DIAGNOSIS — R351 Nocturia: Secondary | ICD-10-CM | POA: Diagnosis not present

## 2019-04-03 DIAGNOSIS — E1169 Type 2 diabetes mellitus with other specified complication: Secondary | ICD-10-CM | POA: Diagnosis not present

## 2019-04-03 DIAGNOSIS — I5032 Chronic diastolic (congestive) heart failure: Secondary | ICD-10-CM | POA: Diagnosis not present

## 2019-04-03 DIAGNOSIS — N189 Chronic kidney disease, unspecified: Secondary | ICD-10-CM | POA: Diagnosis not present

## 2019-04-03 DIAGNOSIS — D631 Anemia in chronic kidney disease: Secondary | ICD-10-CM | POA: Diagnosis not present

## 2019-04-05 DIAGNOSIS — I5032 Chronic diastolic (congestive) heart failure: Secondary | ICD-10-CM | POA: Diagnosis not present

## 2019-04-16 DIAGNOSIS — N189 Chronic kidney disease, unspecified: Secondary | ICD-10-CM | POA: Diagnosis not present

## 2019-04-20 DIAGNOSIS — N184 Chronic kidney disease, stage 4 (severe): Secondary | ICD-10-CM | POA: Diagnosis not present

## 2019-04-20 DIAGNOSIS — I5032 Chronic diastolic (congestive) heart failure: Secondary | ICD-10-CM | POA: Diagnosis not present

## 2019-04-20 DIAGNOSIS — I129 Hypertensive chronic kidney disease with stage 1 through stage 4 chronic kidney disease, or unspecified chronic kidney disease: Secondary | ICD-10-CM | POA: Diagnosis not present

## 2019-04-20 DIAGNOSIS — Z6832 Body mass index (BMI) 32.0-32.9, adult: Secondary | ICD-10-CM | POA: Diagnosis not present

## 2019-04-20 DIAGNOSIS — D631 Anemia in chronic kidney disease: Secondary | ICD-10-CM | POA: Diagnosis not present

## 2019-04-20 DIAGNOSIS — N189 Chronic kidney disease, unspecified: Secondary | ICD-10-CM | POA: Diagnosis not present

## 2019-04-20 DIAGNOSIS — E1122 Type 2 diabetes mellitus with diabetic chronic kidney disease: Secondary | ICD-10-CM | POA: Diagnosis not present

## 2019-04-22 DIAGNOSIS — M0589 Other rheumatoid arthritis with rheumatoid factor of multiple sites: Secondary | ICD-10-CM | POA: Diagnosis not present

## 2019-04-27 DIAGNOSIS — N189 Chronic kidney disease, unspecified: Secondary | ICD-10-CM | POA: Diagnosis not present

## 2019-04-27 DIAGNOSIS — D631 Anemia in chronic kidney disease: Secondary | ICD-10-CM | POA: Diagnosis not present

## 2019-04-27 DIAGNOSIS — R300 Dysuria: Secondary | ICD-10-CM | POA: Diagnosis not present

## 2019-04-27 DIAGNOSIS — D649 Anemia, unspecified: Secondary | ICD-10-CM | POA: Diagnosis not present

## 2019-05-01 DIAGNOSIS — I5032 Chronic diastolic (congestive) heart failure: Secondary | ICD-10-CM | POA: Diagnosis not present

## 2019-05-01 DIAGNOSIS — D631 Anemia in chronic kidney disease: Secondary | ICD-10-CM | POA: Diagnosis not present

## 2019-05-01 DIAGNOSIS — Z6832 Body mass index (BMI) 32.0-32.9, adult: Secondary | ICD-10-CM | POA: Diagnosis not present

## 2019-05-01 DIAGNOSIS — N189 Chronic kidney disease, unspecified: Secondary | ICD-10-CM | POA: Diagnosis not present

## 2019-05-05 DIAGNOSIS — E1122 Type 2 diabetes mellitus with diabetic chronic kidney disease: Secondary | ICD-10-CM | POA: Diagnosis not present

## 2019-05-05 DIAGNOSIS — D631 Anemia in chronic kidney disease: Secondary | ICD-10-CM | POA: Diagnosis not present

## 2019-05-05 DIAGNOSIS — I5032 Chronic diastolic (congestive) heart failure: Secondary | ICD-10-CM | POA: Diagnosis not present

## 2019-05-05 DIAGNOSIS — N189 Chronic kidney disease, unspecified: Secondary | ICD-10-CM | POA: Diagnosis not present

## 2019-05-12 DIAGNOSIS — Z6832 Body mass index (BMI) 32.0-32.9, adult: Secondary | ICD-10-CM | POA: Diagnosis not present

## 2019-05-12 DIAGNOSIS — I5032 Chronic diastolic (congestive) heart failure: Secondary | ICD-10-CM | POA: Diagnosis not present

## 2019-05-12 DIAGNOSIS — D692 Other nonthrombocytopenic purpura: Secondary | ICD-10-CM | POA: Diagnosis not present

## 2019-05-12 DIAGNOSIS — R635 Abnormal weight gain: Secondary | ICD-10-CM | POA: Diagnosis not present

## 2019-05-15 DIAGNOSIS — I5032 Chronic diastolic (congestive) heart failure: Secondary | ICD-10-CM | POA: Diagnosis not present

## 2019-05-15 DIAGNOSIS — N390 Urinary tract infection, site not specified: Secondary | ICD-10-CM | POA: Diagnosis not present

## 2019-05-15 DIAGNOSIS — D631 Anemia in chronic kidney disease: Secondary | ICD-10-CM | POA: Diagnosis not present

## 2019-05-15 DIAGNOSIS — N189 Chronic kidney disease, unspecified: Secondary | ICD-10-CM | POA: Diagnosis not present

## 2019-05-25 DIAGNOSIS — M0589 Other rheumatoid arthritis with rheumatoid factor of multiple sites: Secondary | ICD-10-CM | POA: Diagnosis not present

## 2019-05-26 DIAGNOSIS — I5032 Chronic diastolic (congestive) heart failure: Secondary | ICD-10-CM | POA: Diagnosis not present

## 2019-05-26 DIAGNOSIS — Z6832 Body mass index (BMI) 32.0-32.9, adult: Secondary | ICD-10-CM | POA: Diagnosis not present

## 2019-05-26 DIAGNOSIS — N189 Chronic kidney disease, unspecified: Secondary | ICD-10-CM | POA: Diagnosis not present

## 2019-05-26 DIAGNOSIS — D631 Anemia in chronic kidney disease: Secondary | ICD-10-CM | POA: Diagnosis not present

## 2019-05-27 DIAGNOSIS — E538 Deficiency of other specified B group vitamins: Secondary | ICD-10-CM | POA: Diagnosis not present

## 2019-05-28 DIAGNOSIS — N184 Chronic kidney disease, stage 4 (severe): Secondary | ICD-10-CM | POA: Diagnosis not present

## 2019-05-28 DIAGNOSIS — I129 Hypertensive chronic kidney disease with stage 1 through stage 4 chronic kidney disease, or unspecified chronic kidney disease: Secondary | ICD-10-CM | POA: Diagnosis not present

## 2019-05-28 DIAGNOSIS — N189 Chronic kidney disease, unspecified: Secondary | ICD-10-CM | POA: Diagnosis not present

## 2019-05-28 DIAGNOSIS — N2581 Secondary hyperparathyroidism of renal origin: Secondary | ICD-10-CM | POA: Diagnosis not present

## 2019-05-28 DIAGNOSIS — N390 Urinary tract infection, site not specified: Secondary | ICD-10-CM | POA: Diagnosis not present

## 2019-05-28 DIAGNOSIS — D631 Anemia in chronic kidney disease: Secondary | ICD-10-CM | POA: Diagnosis not present

## 2019-05-29 DIAGNOSIS — D509 Iron deficiency anemia, unspecified: Secondary | ICD-10-CM | POA: Diagnosis not present

## 2019-05-29 DIAGNOSIS — Z6832 Body mass index (BMI) 32.0-32.9, adult: Secondary | ICD-10-CM | POA: Diagnosis not present

## 2019-05-29 DIAGNOSIS — I5032 Chronic diastolic (congestive) heart failure: Secondary | ICD-10-CM | POA: Diagnosis not present

## 2019-06-02 DIAGNOSIS — E119 Type 2 diabetes mellitus without complications: Secondary | ICD-10-CM

## 2019-06-02 DIAGNOSIS — M069 Rheumatoid arthritis, unspecified: Secondary | ICD-10-CM

## 2019-06-02 DIAGNOSIS — N189 Chronic kidney disease, unspecified: Secondary | ICD-10-CM | POA: Diagnosis not present

## 2019-06-02 DIAGNOSIS — D631 Anemia in chronic kidney disease: Secondary | ICD-10-CM

## 2019-06-03 DIAGNOSIS — K921 Melena: Secondary | ICD-10-CM | POA: Diagnosis not present

## 2019-06-03 DIAGNOSIS — D631 Anemia in chronic kidney disease: Secondary | ICD-10-CM | POA: Diagnosis not present

## 2019-06-03 DIAGNOSIS — N189 Chronic kidney disease, unspecified: Secondary | ICD-10-CM | POA: Diagnosis not present

## 2019-06-04 DIAGNOSIS — N189 Chronic kidney disease, unspecified: Secondary | ICD-10-CM | POA: Diagnosis not present

## 2019-06-04 DIAGNOSIS — K921 Melena: Secondary | ICD-10-CM | POA: Diagnosis not present

## 2019-06-04 DIAGNOSIS — D631 Anemia in chronic kidney disease: Secondary | ICD-10-CM | POA: Diagnosis not present

## 2019-06-05 DIAGNOSIS — D631 Anemia in chronic kidney disease: Secondary | ICD-10-CM | POA: Diagnosis not present

## 2019-06-05 DIAGNOSIS — D509 Iron deficiency anemia, unspecified: Secondary | ICD-10-CM | POA: Diagnosis not present

## 2019-06-05 DIAGNOSIS — K921 Melena: Secondary | ICD-10-CM | POA: Diagnosis not present

## 2019-06-05 DIAGNOSIS — N189 Chronic kidney disease, unspecified: Secondary | ICD-10-CM | POA: Diagnosis not present

## 2019-06-05 DIAGNOSIS — N184 Chronic kidney disease, stage 4 (severe): Secondary | ICD-10-CM | POA: Diagnosis not present

## 2019-06-05 DIAGNOSIS — I5032 Chronic diastolic (congestive) heart failure: Secondary | ICD-10-CM | POA: Diagnosis not present

## 2019-06-12 DIAGNOSIS — N189 Chronic kidney disease, unspecified: Secondary | ICD-10-CM | POA: Diagnosis not present

## 2019-06-12 DIAGNOSIS — I5032 Chronic diastolic (congestive) heart failure: Secondary | ICD-10-CM | POA: Diagnosis not present

## 2019-06-12 DIAGNOSIS — D631 Anemia in chronic kidney disease: Secondary | ICD-10-CM | POA: Diagnosis not present

## 2019-06-17 DIAGNOSIS — K5900 Constipation, unspecified: Secondary | ICD-10-CM | POA: Diagnosis not present

## 2019-06-17 DIAGNOSIS — K219 Gastro-esophageal reflux disease without esophagitis: Secondary | ICD-10-CM | POA: Diagnosis not present

## 2019-06-17 DIAGNOSIS — D500 Iron deficiency anemia secondary to blood loss (chronic): Secondary | ICD-10-CM | POA: Diagnosis not present

## 2019-06-18 DIAGNOSIS — E1169 Type 2 diabetes mellitus with other specified complication: Secondary | ICD-10-CM | POA: Diagnosis not present

## 2019-06-18 DIAGNOSIS — E1122 Type 2 diabetes mellitus with diabetic chronic kidney disease: Secondary | ICD-10-CM | POA: Diagnosis not present

## 2019-06-19 DIAGNOSIS — I480 Paroxysmal atrial fibrillation: Secondary | ICD-10-CM | POA: Diagnosis not present

## 2019-06-19 DIAGNOSIS — Z87891 Personal history of nicotine dependence: Secondary | ICD-10-CM | POA: Diagnosis not present

## 2019-06-19 DIAGNOSIS — E782 Mixed hyperlipidemia: Secondary | ICD-10-CM | POA: Diagnosis not present

## 2019-06-19 DIAGNOSIS — I10 Essential (primary) hypertension: Secondary | ICD-10-CM | POA: Diagnosis not present

## 2019-06-19 DIAGNOSIS — Z8719 Personal history of other diseases of the digestive system: Secondary | ICD-10-CM | POA: Diagnosis not present

## 2019-06-19 DIAGNOSIS — I350 Nonrheumatic aortic (valve) stenosis: Secondary | ICD-10-CM | POA: Diagnosis not present

## 2019-06-19 DIAGNOSIS — I4519 Other right bundle-branch block: Secondary | ICD-10-CM | POA: Diagnosis not present

## 2019-06-19 DIAGNOSIS — I484 Atypical atrial flutter: Secondary | ICD-10-CM | POA: Diagnosis not present

## 2019-06-22 DIAGNOSIS — M0589 Other rheumatoid arthritis with rheumatoid factor of multiple sites: Secondary | ICD-10-CM | POA: Diagnosis not present

## 2019-06-23 DIAGNOSIS — D518 Other vitamin B12 deficiency anemias: Secondary | ICD-10-CM | POA: Diagnosis not present

## 2019-06-23 DIAGNOSIS — D631 Anemia in chronic kidney disease: Secondary | ICD-10-CM | POA: Diagnosis not present

## 2019-06-23 DIAGNOSIS — N189 Chronic kidney disease, unspecified: Secondary | ICD-10-CM | POA: Diagnosis not present

## 2019-06-30 DIAGNOSIS — E1122 Type 2 diabetes mellitus with diabetic chronic kidney disease: Secondary | ICD-10-CM | POA: Diagnosis not present

## 2019-06-30 DIAGNOSIS — Z90710 Acquired absence of both cervix and uterus: Secondary | ICD-10-CM | POA: Diagnosis not present

## 2019-06-30 DIAGNOSIS — K3189 Other diseases of stomach and duodenum: Secondary | ICD-10-CM | POA: Diagnosis not present

## 2019-06-30 DIAGNOSIS — N189 Chronic kidney disease, unspecified: Secondary | ICD-10-CM | POA: Diagnosis not present

## 2019-06-30 DIAGNOSIS — I130 Hypertensive heart and chronic kidney disease with heart failure and stage 1 through stage 4 chronic kidney disease, or unspecified chronic kidney disease: Secondary | ICD-10-CM | POA: Diagnosis not present

## 2019-06-30 DIAGNOSIS — D500 Iron deficiency anemia secondary to blood loss (chronic): Secondary | ICD-10-CM | POA: Diagnosis not present

## 2019-06-30 DIAGNOSIS — K319 Disease of stomach and duodenum, unspecified: Secondary | ICD-10-CM | POA: Diagnosis not present

## 2019-06-30 DIAGNOSIS — Z79899 Other long term (current) drug therapy: Secondary | ICD-10-CM | POA: Diagnosis not present

## 2019-06-30 DIAGNOSIS — I509 Heart failure, unspecified: Secondary | ICD-10-CM | POA: Diagnosis not present

## 2019-06-30 DIAGNOSIS — M069 Rheumatoid arthritis, unspecified: Secondary | ICD-10-CM | POA: Diagnosis not present

## 2019-06-30 DIAGNOSIS — K219 Gastro-esophageal reflux disease without esophagitis: Secondary | ICD-10-CM | POA: Diagnosis not present

## 2019-06-30 DIAGNOSIS — K921 Melena: Secondary | ICD-10-CM | POA: Diagnosis not present

## 2019-06-30 DIAGNOSIS — D649 Anemia, unspecified: Secondary | ICD-10-CM | POA: Diagnosis not present

## 2019-06-30 DIAGNOSIS — K648 Other hemorrhoids: Secondary | ICD-10-CM | POA: Diagnosis not present

## 2019-06-30 DIAGNOSIS — J45909 Unspecified asthma, uncomplicated: Secondary | ICD-10-CM | POA: Diagnosis not present

## 2019-06-30 DIAGNOSIS — Z980 Intestinal bypass and anastomosis status: Secondary | ICD-10-CM | POA: Diagnosis not present

## 2019-06-30 DIAGNOSIS — K5900 Constipation, unspecified: Secondary | ICD-10-CM | POA: Diagnosis not present

## 2019-06-30 DIAGNOSIS — Z9049 Acquired absence of other specified parts of digestive tract: Secondary | ICD-10-CM | POA: Diagnosis not present

## 2019-06-30 DIAGNOSIS — K5730 Diverticulosis of large intestine without perforation or abscess without bleeding: Secondary | ICD-10-CM | POA: Diagnosis not present

## 2019-07-01 DIAGNOSIS — N184 Chronic kidney disease, stage 4 (severe): Secondary | ICD-10-CM | POA: Diagnosis not present

## 2019-07-01 DIAGNOSIS — I5032 Chronic diastolic (congestive) heart failure: Secondary | ICD-10-CM | POA: Diagnosis not present

## 2019-07-01 DIAGNOSIS — D631 Anemia in chronic kidney disease: Secondary | ICD-10-CM | POA: Diagnosis not present

## 2019-07-01 DIAGNOSIS — N189 Chronic kidney disease, unspecified: Secondary | ICD-10-CM | POA: Diagnosis not present

## 2019-07-02 DIAGNOSIS — R000 Tachycardia, unspecified: Secondary | ICD-10-CM | POA: Diagnosis not present

## 2019-07-02 DIAGNOSIS — I480 Paroxysmal atrial fibrillation: Secondary | ICD-10-CM | POA: Diagnosis not present

## 2019-07-03 DIAGNOSIS — N189 Chronic kidney disease, unspecified: Secondary | ICD-10-CM | POA: Diagnosis not present

## 2019-07-03 DIAGNOSIS — I5032 Chronic diastolic (congestive) heart failure: Secondary | ICD-10-CM | POA: Diagnosis not present

## 2019-07-03 DIAGNOSIS — E1149 Type 2 diabetes mellitus with other diabetic neurological complication: Secondary | ICD-10-CM | POA: Diagnosis not present

## 2019-07-03 DIAGNOSIS — D631 Anemia in chronic kidney disease: Secondary | ICD-10-CM | POA: Diagnosis not present

## 2019-07-06 DIAGNOSIS — E1149 Type 2 diabetes mellitus with other diabetic neurological complication: Secondary | ICD-10-CM | POA: Diagnosis not present

## 2019-07-06 DIAGNOSIS — N189 Chronic kidney disease, unspecified: Secondary | ICD-10-CM | POA: Diagnosis not present

## 2019-07-06 DIAGNOSIS — I5032 Chronic diastolic (congestive) heart failure: Secondary | ICD-10-CM | POA: Diagnosis not present

## 2019-07-06 DIAGNOSIS — D631 Anemia in chronic kidney disease: Secondary | ICD-10-CM | POA: Diagnosis not present

## 2019-07-07 DIAGNOSIS — I480 Paroxysmal atrial fibrillation: Secondary | ICD-10-CM | POA: Diagnosis not present

## 2019-07-07 DIAGNOSIS — R9439 Abnormal result of other cardiovascular function study: Secondary | ICD-10-CM | POA: Diagnosis not present

## 2019-07-07 DIAGNOSIS — I493 Ventricular premature depolarization: Secondary | ICD-10-CM | POA: Diagnosis not present

## 2019-07-07 DIAGNOSIS — I471 Supraventricular tachycardia: Secondary | ICD-10-CM | POA: Diagnosis not present

## 2019-07-10 DIAGNOSIS — N184 Chronic kidney disease, stage 4 (severe): Secondary | ICD-10-CM | POA: Diagnosis not present

## 2019-07-10 DIAGNOSIS — E119 Type 2 diabetes mellitus without complications: Secondary | ICD-10-CM | POA: Diagnosis not present

## 2019-07-10 DIAGNOSIS — I480 Paroxysmal atrial fibrillation: Secondary | ICD-10-CM | POA: Diagnosis not present

## 2019-07-10 DIAGNOSIS — I4510 Unspecified right bundle-branch block: Secondary | ICD-10-CM | POA: Diagnosis not present

## 2019-07-10 DIAGNOSIS — I4891 Unspecified atrial fibrillation: Secondary | ICD-10-CM | POA: Diagnosis not present

## 2019-07-10 DIAGNOSIS — I272 Pulmonary hypertension, unspecified: Secondary | ICD-10-CM | POA: Diagnosis not present

## 2019-07-10 DIAGNOSIS — I4819 Other persistent atrial fibrillation: Secondary | ICD-10-CM | POA: Diagnosis not present

## 2019-07-10 DIAGNOSIS — I10 Essential (primary) hypertension: Secondary | ICD-10-CM | POA: Diagnosis not present

## 2019-07-10 DIAGNOSIS — J449 Chronic obstructive pulmonary disease, unspecified: Secondary | ICD-10-CM | POA: Diagnosis not present

## 2019-07-14 DIAGNOSIS — N184 Chronic kidney disease, stage 4 (severe): Secondary | ICD-10-CM | POA: Diagnosis not present

## 2019-07-14 DIAGNOSIS — Z6834 Body mass index (BMI) 34.0-34.9, adult: Secondary | ICD-10-CM | POA: Diagnosis not present

## 2019-07-14 DIAGNOSIS — R82998 Other abnormal findings in urine: Secondary | ICD-10-CM | POA: Diagnosis not present

## 2019-07-14 DIAGNOSIS — I5032 Chronic diastolic (congestive) heart failure: Secondary | ICD-10-CM | POA: Diagnosis not present

## 2019-07-15 DIAGNOSIS — H52223 Regular astigmatism, bilateral: Secondary | ICD-10-CM | POA: Diagnosis not present

## 2019-07-15 DIAGNOSIS — H1859 Other hereditary corneal dystrophies: Secondary | ICD-10-CM | POA: Diagnosis not present

## 2019-07-15 DIAGNOSIS — H532 Diplopia: Secondary | ICD-10-CM | POA: Diagnosis not present

## 2019-07-17 DIAGNOSIS — M0579 Rheumatoid arthritis with rheumatoid factor of multiple sites without organ or systems involvement: Secondary | ICD-10-CM | POA: Diagnosis not present

## 2019-07-17 DIAGNOSIS — M150 Primary generalized (osteo)arthritis: Secondary | ICD-10-CM | POA: Diagnosis not present

## 2019-07-17 DIAGNOSIS — R7612 Nonspecific reaction to cell mediated immunity measurement of gamma interferon antigen response without active tuberculosis: Secondary | ICD-10-CM | POA: Diagnosis not present

## 2019-07-17 DIAGNOSIS — M797 Fibromyalgia: Secondary | ICD-10-CM | POA: Diagnosis not present

## 2019-07-20 DIAGNOSIS — N189 Chronic kidney disease, unspecified: Secondary | ICD-10-CM | POA: Diagnosis not present

## 2019-07-20 DIAGNOSIS — D638 Anemia in other chronic diseases classified elsewhere: Secondary | ICD-10-CM | POA: Diagnosis not present

## 2019-07-20 DIAGNOSIS — D631 Anemia in chronic kidney disease: Secondary | ICD-10-CM | POA: Diagnosis not present

## 2019-07-20 DIAGNOSIS — E538 Deficiency of other specified B group vitamins: Secondary | ICD-10-CM | POA: Diagnosis not present

## 2019-07-21 DIAGNOSIS — I129 Hypertensive chronic kidney disease with stage 1 through stage 4 chronic kidney disease, or unspecified chronic kidney disease: Secondary | ICD-10-CM | POA: Diagnosis not present

## 2019-07-21 DIAGNOSIS — D631 Anemia in chronic kidney disease: Secondary | ICD-10-CM | POA: Diagnosis not present

## 2019-07-21 DIAGNOSIS — N2581 Secondary hyperparathyroidism of renal origin: Secondary | ICD-10-CM | POA: Diagnosis not present

## 2019-07-21 DIAGNOSIS — N184 Chronic kidney disease, stage 4 (severe): Secondary | ICD-10-CM | POA: Diagnosis not present

## 2019-07-23 DIAGNOSIS — I495 Sick sinus syndrome: Secondary | ICD-10-CM | POA: Diagnosis not present

## 2019-07-23 DIAGNOSIS — E782 Mixed hyperlipidemia: Secondary | ICD-10-CM | POA: Diagnosis not present

## 2019-07-23 DIAGNOSIS — I351 Nonrheumatic aortic (valve) insufficiency: Secondary | ICD-10-CM | POA: Diagnosis not present

## 2019-07-23 DIAGNOSIS — Z7901 Long term (current) use of anticoagulants: Secondary | ICD-10-CM | POA: Diagnosis not present

## 2019-07-23 DIAGNOSIS — I480 Paroxysmal atrial fibrillation: Secondary | ICD-10-CM | POA: Diagnosis not present

## 2019-07-23 DIAGNOSIS — I350 Nonrheumatic aortic (valve) stenosis: Secondary | ICD-10-CM | POA: Diagnosis not present

## 2019-07-23 DIAGNOSIS — Z87891 Personal history of nicotine dependence: Secondary | ICD-10-CM | POA: Diagnosis not present

## 2019-07-23 DIAGNOSIS — I10 Essential (primary) hypertension: Secondary | ICD-10-CM | POA: Diagnosis not present

## 2019-07-23 DIAGNOSIS — Z8719 Personal history of other diseases of the digestive system: Secondary | ICD-10-CM | POA: Diagnosis not present

## 2019-07-24 DIAGNOSIS — I5032 Chronic diastolic (congestive) heart failure: Secondary | ICD-10-CM | POA: Diagnosis not present

## 2019-07-24 DIAGNOSIS — Z6834 Body mass index (BMI) 34.0-34.9, adult: Secondary | ICD-10-CM | POA: Diagnosis not present

## 2019-07-27 DIAGNOSIS — M0589 Other rheumatoid arthritis with rheumatoid factor of multiple sites: Secondary | ICD-10-CM | POA: Diagnosis not present

## 2019-07-31 DIAGNOSIS — I5032 Chronic diastolic (congestive) heart failure: Secondary | ICD-10-CM | POA: Diagnosis not present

## 2019-07-31 DIAGNOSIS — D631 Anemia in chronic kidney disease: Secondary | ICD-10-CM | POA: Diagnosis not present

## 2019-07-31 DIAGNOSIS — Z23 Encounter for immunization: Secondary | ICD-10-CM | POA: Diagnosis not present

## 2019-07-31 DIAGNOSIS — N189 Chronic kidney disease, unspecified: Secondary | ICD-10-CM | POA: Diagnosis not present

## 2019-07-31 DIAGNOSIS — D649 Anemia, unspecified: Secondary | ICD-10-CM | POA: Diagnosis not present

## 2019-08-05 DIAGNOSIS — N184 Chronic kidney disease, stage 4 (severe): Secondary | ICD-10-CM | POA: Diagnosis not present

## 2019-08-05 DIAGNOSIS — D631 Anemia in chronic kidney disease: Secondary | ICD-10-CM | POA: Diagnosis not present

## 2019-08-05 DIAGNOSIS — I5032 Chronic diastolic (congestive) heart failure: Secondary | ICD-10-CM | POA: Diagnosis not present

## 2019-08-05 DIAGNOSIS — N189 Chronic kidney disease, unspecified: Secondary | ICD-10-CM | POA: Diagnosis not present

## 2019-08-10 DIAGNOSIS — R0989 Other specified symptoms and signs involving the circulatory and respiratory systems: Secondary | ICD-10-CM | POA: Diagnosis not present

## 2019-08-10 DIAGNOSIS — I5032 Chronic diastolic (congestive) heart failure: Secondary | ICD-10-CM | POA: Diagnosis not present

## 2019-08-10 DIAGNOSIS — Z6834 Body mass index (BMI) 34.0-34.9, adult: Secondary | ICD-10-CM | POA: Diagnosis not present

## 2019-08-17 DIAGNOSIS — E538 Deficiency of other specified B group vitamins: Secondary | ICD-10-CM | POA: Diagnosis not present

## 2019-08-17 DIAGNOSIS — D631 Anemia in chronic kidney disease: Secondary | ICD-10-CM | POA: Diagnosis not present

## 2019-08-17 DIAGNOSIS — D509 Iron deficiency anemia, unspecified: Secondary | ICD-10-CM | POA: Diagnosis not present

## 2019-08-17 DIAGNOSIS — N189 Chronic kidney disease, unspecified: Secondary | ICD-10-CM | POA: Diagnosis not present

## 2019-08-24 DIAGNOSIS — M0589 Other rheumatoid arthritis with rheumatoid factor of multiple sites: Secondary | ICD-10-CM | POA: Diagnosis not present

## 2019-08-28 DIAGNOSIS — D631 Anemia in chronic kidney disease: Secondary | ICD-10-CM | POA: Diagnosis not present

## 2019-08-28 DIAGNOSIS — I5032 Chronic diastolic (congestive) heart failure: Secondary | ICD-10-CM | POA: Diagnosis not present

## 2019-08-28 DIAGNOSIS — N189 Chronic kidney disease, unspecified: Secondary | ICD-10-CM | POA: Diagnosis not present

## 2019-08-28 DIAGNOSIS — Z6833 Body mass index (BMI) 33.0-33.9, adult: Secondary | ICD-10-CM | POA: Diagnosis not present

## 2019-09-03 DIAGNOSIS — I5032 Chronic diastolic (congestive) heart failure: Secondary | ICD-10-CM | POA: Diagnosis not present

## 2019-09-04 DIAGNOSIS — D631 Anemia in chronic kidney disease: Secondary | ICD-10-CM | POA: Diagnosis not present

## 2019-09-04 DIAGNOSIS — N189 Chronic kidney disease, unspecified: Secondary | ICD-10-CM | POA: Diagnosis not present

## 2019-09-04 DIAGNOSIS — I5032 Chronic diastolic (congestive) heart failure: Secondary | ICD-10-CM | POA: Diagnosis not present

## 2019-09-10 DIAGNOSIS — I480 Paroxysmal atrial fibrillation: Secondary | ICD-10-CM | POA: Diagnosis not present

## 2019-09-11 DIAGNOSIS — I4892 Unspecified atrial flutter: Secondary | ICD-10-CM | POA: Diagnosis not present

## 2019-09-11 DIAGNOSIS — I454 Nonspecific intraventricular block: Secondary | ICD-10-CM | POA: Diagnosis not present

## 2019-09-16 DIAGNOSIS — D631 Anemia in chronic kidney disease: Secondary | ICD-10-CM | POA: Diagnosis not present

## 2019-09-16 DIAGNOSIS — N189 Chronic kidney disease, unspecified: Secondary | ICD-10-CM | POA: Diagnosis not present

## 2019-09-17 DIAGNOSIS — N189 Chronic kidney disease, unspecified: Secondary | ICD-10-CM | POA: Diagnosis not present

## 2019-09-17 DIAGNOSIS — I129 Hypertensive chronic kidney disease with stage 1 through stage 4 chronic kidney disease, or unspecified chronic kidney disease: Secondary | ICD-10-CM | POA: Diagnosis not present

## 2019-09-17 DIAGNOSIS — E1122 Type 2 diabetes mellitus with diabetic chronic kidney disease: Secondary | ICD-10-CM | POA: Diagnosis not present

## 2019-09-17 DIAGNOSIS — N184 Chronic kidney disease, stage 4 (severe): Secondary | ICD-10-CM | POA: Diagnosis not present

## 2019-09-17 DIAGNOSIS — D631 Anemia in chronic kidney disease: Secondary | ICD-10-CM | POA: Diagnosis not present

## 2019-09-17 DIAGNOSIS — R000 Tachycardia, unspecified: Secondary | ICD-10-CM | POA: Diagnosis not present

## 2019-09-17 DIAGNOSIS — I5032 Chronic diastolic (congestive) heart failure: Secondary | ICD-10-CM | POA: Diagnosis not present

## 2019-09-21 DIAGNOSIS — M0589 Other rheumatoid arthritis with rheumatoid factor of multiple sites: Secondary | ICD-10-CM | POA: Diagnosis not present

## 2019-09-22 DIAGNOSIS — Z1231 Encounter for screening mammogram for malignant neoplasm of breast: Secondary | ICD-10-CM | POA: Diagnosis not present

## 2019-09-24 DIAGNOSIS — I480 Paroxysmal atrial fibrillation: Secondary | ICD-10-CM | POA: Diagnosis not present

## 2019-09-24 DIAGNOSIS — I495 Sick sinus syndrome: Secondary | ICD-10-CM | POA: Diagnosis not present

## 2019-09-24 DIAGNOSIS — I491 Atrial premature depolarization: Secondary | ICD-10-CM | POA: Diagnosis not present

## 2019-09-24 DIAGNOSIS — R0989 Other specified symptoms and signs involving the circulatory and respiratory systems: Secondary | ICD-10-CM | POA: Diagnosis not present

## 2019-09-24 DIAGNOSIS — Z8719 Personal history of other diseases of the digestive system: Secondary | ICD-10-CM | POA: Diagnosis not present

## 2019-09-24 DIAGNOSIS — Z87891 Personal history of nicotine dependence: Secondary | ICD-10-CM | POA: Diagnosis not present

## 2019-09-24 DIAGNOSIS — R030 Elevated blood-pressure reading, without diagnosis of hypertension: Secondary | ICD-10-CM | POA: Diagnosis not present

## 2019-09-24 DIAGNOSIS — I351 Nonrheumatic aortic (valve) insufficiency: Secondary | ICD-10-CM | POA: Diagnosis not present

## 2019-09-24 DIAGNOSIS — E782 Mixed hyperlipidemia: Secondary | ICD-10-CM | POA: Diagnosis not present

## 2019-09-24 DIAGNOSIS — I350 Nonrheumatic aortic (valve) stenosis: Secondary | ICD-10-CM | POA: Diagnosis not present

## 2019-09-28 DIAGNOSIS — Z6834 Body mass index (BMI) 34.0-34.9, adult: Secondary | ICD-10-CM | POA: Diagnosis not present

## 2019-09-28 DIAGNOSIS — B3741 Candidal cystitis and urethritis: Secondary | ICD-10-CM | POA: Diagnosis not present

## 2019-09-28 DIAGNOSIS — R3915 Urgency of urination: Secondary | ICD-10-CM | POA: Diagnosis not present

## 2019-10-04 DIAGNOSIS — I5032 Chronic diastolic (congestive) heart failure: Secondary | ICD-10-CM | POA: Diagnosis not present

## 2019-10-05 DIAGNOSIS — N189 Chronic kidney disease, unspecified: Secondary | ICD-10-CM | POA: Diagnosis not present

## 2019-10-05 DIAGNOSIS — E1122 Type 2 diabetes mellitus with diabetic chronic kidney disease: Secondary | ICD-10-CM | POA: Diagnosis not present

## 2019-10-05 DIAGNOSIS — I5032 Chronic diastolic (congestive) heart failure: Secondary | ICD-10-CM | POA: Diagnosis not present

## 2019-10-05 DIAGNOSIS — D631 Anemia in chronic kidney disease: Secondary | ICD-10-CM | POA: Diagnosis not present

## 2019-10-08 DIAGNOSIS — N390 Urinary tract infection, site not specified: Secondary | ICD-10-CM | POA: Diagnosis not present

## 2019-10-12 DIAGNOSIS — N189 Chronic kidney disease, unspecified: Secondary | ICD-10-CM | POA: Diagnosis not present

## 2019-10-12 DIAGNOSIS — D631 Anemia in chronic kidney disease: Secondary | ICD-10-CM | POA: Diagnosis not present

## 2019-10-19 DIAGNOSIS — M0589 Other rheumatoid arthritis with rheumatoid factor of multiple sites: Secondary | ICD-10-CM | POA: Diagnosis not present

## 2019-11-12 DIAGNOSIS — I4819 Other persistent atrial fibrillation: Secondary | ICD-10-CM | POA: Diagnosis not present

## 2019-11-12 DIAGNOSIS — I351 Nonrheumatic aortic (valve) insufficiency: Secondary | ICD-10-CM | POA: Diagnosis not present

## 2019-11-12 DIAGNOSIS — I129 Hypertensive chronic kidney disease with stage 1 through stage 4 chronic kidney disease, or unspecified chronic kidney disease: Secondary | ICD-10-CM | POA: Diagnosis not present

## 2019-11-12 DIAGNOSIS — Z7901 Long term (current) use of anticoagulants: Secondary | ICD-10-CM | POA: Diagnosis not present

## 2019-11-12 DIAGNOSIS — Z8719 Personal history of other diseases of the digestive system: Secondary | ICD-10-CM | POA: Diagnosis not present

## 2019-11-12 DIAGNOSIS — I480 Paroxysmal atrial fibrillation: Secondary | ICD-10-CM | POA: Diagnosis not present

## 2019-11-12 DIAGNOSIS — I495 Sick sinus syndrome: Secondary | ICD-10-CM | POA: Diagnosis not present

## 2019-11-12 DIAGNOSIS — I350 Nonrheumatic aortic (valve) stenosis: Secondary | ICD-10-CM | POA: Diagnosis not present

## 2019-11-12 DIAGNOSIS — G4733 Obstructive sleep apnea (adult) (pediatric): Secondary | ICD-10-CM | POA: Diagnosis not present

## 2019-11-12 DIAGNOSIS — I491 Atrial premature depolarization: Secondary | ICD-10-CM | POA: Diagnosis not present

## 2019-11-12 DIAGNOSIS — Z87891 Personal history of nicotine dependence: Secondary | ICD-10-CM | POA: Diagnosis not present

## 2019-11-12 DIAGNOSIS — N183 Chronic kidney disease, stage 3 unspecified: Secondary | ICD-10-CM | POA: Diagnosis not present

## 2019-11-13 DIAGNOSIS — R001 Bradycardia, unspecified: Secondary | ICD-10-CM | POA: Diagnosis not present

## 2019-11-13 DIAGNOSIS — I4510 Unspecified right bundle-branch block: Secondary | ICD-10-CM | POA: Diagnosis not present

## 2019-11-18 DIAGNOSIS — N189 Chronic kidney disease, unspecified: Secondary | ICD-10-CM | POA: Diagnosis not present

## 2019-11-18 DIAGNOSIS — I509 Heart failure, unspecified: Secondary | ICD-10-CM | POA: Diagnosis not present

## 2019-11-18 DIAGNOSIS — D638 Anemia in other chronic diseases classified elsewhere: Secondary | ICD-10-CM | POA: Diagnosis not present

## 2019-11-18 DIAGNOSIS — M069 Rheumatoid arthritis, unspecified: Secondary | ICD-10-CM | POA: Diagnosis not present

## 2019-11-18 DIAGNOSIS — D631 Anemia in chronic kidney disease: Secondary | ICD-10-CM | POA: Diagnosis not present

## 2019-11-19 DIAGNOSIS — N184 Chronic kidney disease, stage 4 (severe): Secondary | ICD-10-CM | POA: Diagnosis not present

## 2019-11-19 DIAGNOSIS — D509 Iron deficiency anemia, unspecified: Secondary | ICD-10-CM | POA: Diagnosis not present

## 2019-11-19 DIAGNOSIS — E1122 Type 2 diabetes mellitus with diabetic chronic kidney disease: Secondary | ICD-10-CM | POA: Diagnosis not present

## 2019-11-19 DIAGNOSIS — I129 Hypertensive chronic kidney disease with stage 1 through stage 4 chronic kidney disease, or unspecified chronic kidney disease: Secondary | ICD-10-CM | POA: Diagnosis not present

## 2019-11-19 DIAGNOSIS — I5032 Chronic diastolic (congestive) heart failure: Secondary | ICD-10-CM | POA: Diagnosis not present

## 2019-11-20 DIAGNOSIS — I129 Hypertensive chronic kidney disease with stage 1 through stage 4 chronic kidney disease, or unspecified chronic kidney disease: Secondary | ICD-10-CM | POA: Diagnosis not present

## 2019-11-20 DIAGNOSIS — D631 Anemia in chronic kidney disease: Secondary | ICD-10-CM | POA: Diagnosis not present

## 2019-11-20 DIAGNOSIS — N184 Chronic kidney disease, stage 4 (severe): Secondary | ICD-10-CM | POA: Diagnosis not present

## 2019-11-20 DIAGNOSIS — Z23 Encounter for immunization: Secondary | ICD-10-CM | POA: Diagnosis not present

## 2019-11-20 DIAGNOSIS — N2581 Secondary hyperparathyroidism of renal origin: Secondary | ICD-10-CM | POA: Diagnosis not present

## 2019-11-23 DIAGNOSIS — I5032 Chronic diastolic (congestive) heart failure: Secondary | ICD-10-CM | POA: Diagnosis not present

## 2019-11-23 DIAGNOSIS — I4819 Other persistent atrial fibrillation: Secondary | ICD-10-CM | POA: Diagnosis not present

## 2019-11-23 DIAGNOSIS — Z6833 Body mass index (BMI) 33.0-33.9, adult: Secondary | ICD-10-CM | POA: Diagnosis not present

## 2019-11-23 DIAGNOSIS — D509 Iron deficiency anemia, unspecified: Secondary | ICD-10-CM | POA: Diagnosis not present

## 2019-11-25 DIAGNOSIS — I4891 Unspecified atrial fibrillation: Secondary | ICD-10-CM | POA: Diagnosis not present

## 2019-11-26 DIAGNOSIS — I4891 Unspecified atrial fibrillation: Secondary | ICD-10-CM | POA: Diagnosis not present

## 2019-11-30 DIAGNOSIS — D631 Anemia in chronic kidney disease: Secondary | ICD-10-CM | POA: Diagnosis not present

## 2019-11-30 DIAGNOSIS — N189 Chronic kidney disease, unspecified: Secondary | ICD-10-CM | POA: Diagnosis not present

## 2019-12-03 DIAGNOSIS — E669 Obesity, unspecified: Secondary | ICD-10-CM | POA: Diagnosis not present

## 2019-12-03 DIAGNOSIS — R0683 Snoring: Secondary | ICD-10-CM | POA: Diagnosis not present

## 2019-12-03 DIAGNOSIS — I129 Hypertensive chronic kidney disease with stage 1 through stage 4 chronic kidney disease, or unspecified chronic kidney disease: Secondary | ICD-10-CM | POA: Diagnosis not present

## 2019-12-03 DIAGNOSIS — I480 Paroxysmal atrial fibrillation: Secondary | ICD-10-CM | POA: Diagnosis not present

## 2019-12-03 DIAGNOSIS — G4733 Obstructive sleep apnea (adult) (pediatric): Secondary | ICD-10-CM | POA: Diagnosis not present

## 2019-12-04 DIAGNOSIS — E1122 Type 2 diabetes mellitus with diabetic chronic kidney disease: Secondary | ICD-10-CM | POA: Diagnosis not present

## 2019-12-04 DIAGNOSIS — N184 Chronic kidney disease, stage 4 (severe): Secondary | ICD-10-CM | POA: Diagnosis not present

## 2019-12-04 DIAGNOSIS — I129 Hypertensive chronic kidney disease with stage 1 through stage 4 chronic kidney disease, or unspecified chronic kidney disease: Secondary | ICD-10-CM | POA: Diagnosis not present

## 2019-12-04 DIAGNOSIS — I5032 Chronic diastolic (congestive) heart failure: Secondary | ICD-10-CM | POA: Diagnosis not present

## 2019-12-07 DIAGNOSIS — D631 Anemia in chronic kidney disease: Secondary | ICD-10-CM | POA: Diagnosis not present

## 2019-12-07 DIAGNOSIS — N189 Chronic kidney disease, unspecified: Secondary | ICD-10-CM | POA: Diagnosis not present

## 2019-12-10 DIAGNOSIS — I5032 Chronic diastolic (congestive) heart failure: Secondary | ICD-10-CM | POA: Diagnosis not present

## 2019-12-10 DIAGNOSIS — Z20822 Contact with and (suspected) exposure to covid-19: Secondary | ICD-10-CM | POA: Diagnosis not present

## 2019-12-10 DIAGNOSIS — D509 Iron deficiency anemia, unspecified: Secondary | ICD-10-CM | POA: Diagnosis not present

## 2019-12-10 DIAGNOSIS — J449 Chronic obstructive pulmonary disease, unspecified: Secondary | ICD-10-CM | POA: Diagnosis not present

## 2019-12-29 DIAGNOSIS — S199XXA Unspecified injury of neck, initial encounter: Secondary | ICD-10-CM | POA: Diagnosis not present

## 2019-12-29 DIAGNOSIS — Z6833 Body mass index (BMI) 33.0-33.9, adult: Secondary | ICD-10-CM | POA: Diagnosis not present

## 2019-12-29 DIAGNOSIS — S0083XA Contusion of other part of head, initial encounter: Secondary | ICD-10-CM | POA: Diagnosis not present

## 2019-12-29 DIAGNOSIS — M25569 Pain in unspecified knee: Secondary | ICD-10-CM | POA: Diagnosis not present

## 2019-12-29 DIAGNOSIS — D509 Iron deficiency anemia, unspecified: Secondary | ICD-10-CM | POA: Diagnosis not present

## 2019-12-29 DIAGNOSIS — S0993XA Unspecified injury of face, initial encounter: Secondary | ICD-10-CM | POA: Diagnosis not present

## 2020-01-02 DIAGNOSIS — I129 Hypertensive chronic kidney disease with stage 1 through stage 4 chronic kidney disease, or unspecified chronic kidney disease: Secondary | ICD-10-CM | POA: Diagnosis not present

## 2020-01-03 DIAGNOSIS — N184 Chronic kidney disease, stage 4 (severe): Secondary | ICD-10-CM | POA: Diagnosis not present

## 2020-01-03 DIAGNOSIS — I5032 Chronic diastolic (congestive) heart failure: Secondary | ICD-10-CM | POA: Diagnosis not present

## 2020-01-03 DIAGNOSIS — I129 Hypertensive chronic kidney disease with stage 1 through stage 4 chronic kidney disease, or unspecified chronic kidney disease: Secondary | ICD-10-CM | POA: Diagnosis not present

## 2020-01-07 DIAGNOSIS — I5032 Chronic diastolic (congestive) heart failure: Secondary | ICD-10-CM | POA: Diagnosis not present

## 2020-01-07 DIAGNOSIS — I129 Hypertensive chronic kidney disease with stage 1 through stage 4 chronic kidney disease, or unspecified chronic kidney disease: Secondary | ICD-10-CM | POA: Diagnosis not present

## 2020-01-07 DIAGNOSIS — E1122 Type 2 diabetes mellitus with diabetic chronic kidney disease: Secondary | ICD-10-CM | POA: Diagnosis not present

## 2020-01-07 DIAGNOSIS — D509 Iron deficiency anemia, unspecified: Secondary | ICD-10-CM | POA: Diagnosis not present

## 2020-01-08 DIAGNOSIS — I480 Paroxysmal atrial fibrillation: Secondary | ICD-10-CM | POA: Diagnosis not present

## 2020-01-08 DIAGNOSIS — I4819 Other persistent atrial fibrillation: Secondary | ICD-10-CM | POA: Diagnosis not present

## 2020-01-08 DIAGNOSIS — E782 Mixed hyperlipidemia: Secondary | ICD-10-CM | POA: Diagnosis not present

## 2020-01-08 DIAGNOSIS — N183 Chronic kidney disease, stage 3 unspecified: Secondary | ICD-10-CM | POA: Diagnosis not present

## 2020-01-08 DIAGNOSIS — I495 Sick sinus syndrome: Secondary | ICD-10-CM | POA: Diagnosis not present

## 2020-01-08 DIAGNOSIS — Z8719 Personal history of other diseases of the digestive system: Secondary | ICD-10-CM | POA: Diagnosis not present

## 2020-01-08 DIAGNOSIS — I129 Hypertensive chronic kidney disease with stage 1 through stage 4 chronic kidney disease, or unspecified chronic kidney disease: Secondary | ICD-10-CM | POA: Diagnosis not present

## 2020-01-08 DIAGNOSIS — I350 Nonrheumatic aortic (valve) stenosis: Secondary | ICD-10-CM | POA: Diagnosis not present

## 2020-01-08 DIAGNOSIS — I484 Atypical atrial flutter: Secondary | ICD-10-CM | POA: Diagnosis not present

## 2020-01-11 DIAGNOSIS — Z6833 Body mass index (BMI) 33.0-33.9, adult: Secondary | ICD-10-CM | POA: Diagnosis not present

## 2020-01-11 DIAGNOSIS — I4510 Unspecified right bundle-branch block: Secondary | ICD-10-CM | POA: Diagnosis not present

## 2020-01-11 DIAGNOSIS — I5032 Chronic diastolic (congestive) heart failure: Secondary | ICD-10-CM | POA: Diagnosis not present

## 2020-01-13 DIAGNOSIS — E538 Deficiency of other specified B group vitamins: Secondary | ICD-10-CM | POA: Diagnosis not present

## 2020-01-13 DIAGNOSIS — N189 Chronic kidney disease, unspecified: Secondary | ICD-10-CM | POA: Diagnosis not present

## 2020-01-13 DIAGNOSIS — D631 Anemia in chronic kidney disease: Secondary | ICD-10-CM | POA: Diagnosis not present

## 2020-01-15 DIAGNOSIS — D692 Other nonthrombocytopenic purpura: Secondary | ICD-10-CM | POA: Diagnosis not present

## 2020-01-15 DIAGNOSIS — D509 Iron deficiency anemia, unspecified: Secondary | ICD-10-CM | POA: Diagnosis not present

## 2020-01-15 DIAGNOSIS — I5032 Chronic diastolic (congestive) heart failure: Secondary | ICD-10-CM | POA: Diagnosis not present

## 2020-01-28 DIAGNOSIS — M0589 Other rheumatoid arthritis with rheumatoid factor of multiple sites: Secondary | ICD-10-CM | POA: Diagnosis not present

## 2020-01-29 DIAGNOSIS — R0602 Shortness of breath: Secondary | ICD-10-CM | POA: Diagnosis not present

## 2020-01-29 DIAGNOSIS — N189 Chronic kidney disease, unspecified: Secondary | ICD-10-CM | POA: Diagnosis not present

## 2020-01-29 DIAGNOSIS — I5032 Chronic diastolic (congestive) heart failure: Secondary | ICD-10-CM | POA: Diagnosis not present

## 2020-01-29 DIAGNOSIS — R05 Cough: Secondary | ICD-10-CM | POA: Diagnosis not present

## 2020-01-29 DIAGNOSIS — D631 Anemia in chronic kidney disease: Secondary | ICD-10-CM | POA: Diagnosis not present

## 2020-01-29 DIAGNOSIS — J441 Chronic obstructive pulmonary disease with (acute) exacerbation: Secondary | ICD-10-CM | POA: Diagnosis not present

## 2020-02-02 DIAGNOSIS — I129 Hypertensive chronic kidney disease with stage 1 through stage 4 chronic kidney disease, or unspecified chronic kidney disease: Secondary | ICD-10-CM | POA: Diagnosis not present

## 2020-02-03 DIAGNOSIS — J441 Chronic obstructive pulmonary disease with (acute) exacerbation: Secondary | ICD-10-CM | POA: Diagnosis not present

## 2020-02-03 DIAGNOSIS — I5032 Chronic diastolic (congestive) heart failure: Secondary | ICD-10-CM | POA: Diagnosis not present

## 2020-02-03 DIAGNOSIS — I129 Hypertensive chronic kidney disease with stage 1 through stage 4 chronic kidney disease, or unspecified chronic kidney disease: Secondary | ICD-10-CM | POA: Diagnosis not present

## 2020-02-03 DIAGNOSIS — N189 Chronic kidney disease, unspecified: Secondary | ICD-10-CM | POA: Diagnosis not present

## 2020-02-04 DIAGNOSIS — R079 Chest pain, unspecified: Secondary | ICD-10-CM | POA: Diagnosis not present

## 2020-02-04 DIAGNOSIS — J449 Chronic obstructive pulmonary disease, unspecified: Secondary | ICD-10-CM | POA: Diagnosis not present

## 2020-02-04 DIAGNOSIS — R918 Other nonspecific abnormal finding of lung field: Secondary | ICD-10-CM | POA: Diagnosis not present

## 2020-02-04 DIAGNOSIS — R05 Cough: Secondary | ICD-10-CM | POA: Diagnosis not present

## 2020-02-04 DIAGNOSIS — Z6837 Body mass index (BMI) 37.0-37.9, adult: Secondary | ICD-10-CM | POA: Diagnosis not present

## 2020-02-04 DIAGNOSIS — I5032 Chronic diastolic (congestive) heart failure: Secondary | ICD-10-CM | POA: Diagnosis not present

## 2020-02-04 DIAGNOSIS — D509 Iron deficiency anemia, unspecified: Secondary | ICD-10-CM | POA: Diagnosis not present

## 2020-02-09 DIAGNOSIS — I4819 Other persistent atrial fibrillation: Secondary | ICD-10-CM | POA: Diagnosis not present

## 2020-02-10 DIAGNOSIS — D500 Iron deficiency anemia secondary to blood loss (chronic): Secondary | ICD-10-CM | POA: Diagnosis not present

## 2020-02-10 DIAGNOSIS — N189 Chronic kidney disease, unspecified: Secondary | ICD-10-CM | POA: Diagnosis not present

## 2020-02-10 DIAGNOSIS — D631 Anemia in chronic kidney disease: Secondary | ICD-10-CM | POA: Diagnosis not present

## 2020-02-17 DIAGNOSIS — I5032 Chronic diastolic (congestive) heart failure: Secondary | ICD-10-CM | POA: Diagnosis not present

## 2020-02-17 DIAGNOSIS — D509 Iron deficiency anemia, unspecified: Secondary | ICD-10-CM | POA: Diagnosis not present

## 2020-02-17 DIAGNOSIS — Z6835 Body mass index (BMI) 35.0-35.9, adult: Secondary | ICD-10-CM | POA: Diagnosis not present

## 2020-02-17 DIAGNOSIS — J449 Chronic obstructive pulmonary disease, unspecified: Secondary | ICD-10-CM | POA: Diagnosis not present

## 2020-02-18 DIAGNOSIS — E782 Mixed hyperlipidemia: Secondary | ICD-10-CM | POA: Diagnosis not present

## 2020-02-18 DIAGNOSIS — I480 Paroxysmal atrial fibrillation: Secondary | ICD-10-CM | POA: Diagnosis not present

## 2020-02-18 DIAGNOSIS — I495 Sick sinus syndrome: Secondary | ICD-10-CM | POA: Diagnosis not present

## 2020-02-18 DIAGNOSIS — I10 Essential (primary) hypertension: Secondary | ICD-10-CM | POA: Diagnosis not present

## 2020-02-18 DIAGNOSIS — G4733 Obstructive sleep apnea (adult) (pediatric): Secondary | ICD-10-CM | POA: Diagnosis not present

## 2020-02-18 DIAGNOSIS — Z87891 Personal history of nicotine dependence: Secondary | ICD-10-CM | POA: Diagnosis not present

## 2020-02-18 DIAGNOSIS — I350 Nonrheumatic aortic (valve) stenosis: Secondary | ICD-10-CM | POA: Diagnosis not present

## 2020-02-18 DIAGNOSIS — Z7901 Long term (current) use of anticoagulants: Secondary | ICD-10-CM | POA: Diagnosis not present

## 2020-02-18 DIAGNOSIS — Z7982 Long term (current) use of aspirin: Secondary | ICD-10-CM | POA: Diagnosis not present

## 2020-02-19 DIAGNOSIS — D509 Iron deficiency anemia, unspecified: Secondary | ICD-10-CM | POA: Diagnosis not present

## 2020-02-19 DIAGNOSIS — Z6836 Body mass index (BMI) 36.0-36.9, adult: Secondary | ICD-10-CM | POA: Diagnosis not present

## 2020-02-19 DIAGNOSIS — I5032 Chronic diastolic (congestive) heart failure: Secondary | ICD-10-CM | POA: Diagnosis not present

## 2020-02-19 DIAGNOSIS — N184 Chronic kidney disease, stage 4 (severe): Secondary | ICD-10-CM | POA: Diagnosis not present

## 2020-02-22 DIAGNOSIS — R9431 Abnormal electrocardiogram [ECG] [EKG]: Secondary | ICD-10-CM | POA: Diagnosis not present

## 2020-02-22 DIAGNOSIS — I255 Ischemic cardiomyopathy: Secondary | ICD-10-CM | POA: Diagnosis not present

## 2020-02-22 DIAGNOSIS — Z7901 Long term (current) use of anticoagulants: Secondary | ICD-10-CM | POA: Diagnosis not present

## 2020-02-22 DIAGNOSIS — I200 Unstable angina: Secondary | ICD-10-CM | POA: Diagnosis not present

## 2020-02-22 DIAGNOSIS — Z8249 Family history of ischemic heart disease and other diseases of the circulatory system: Secondary | ICD-10-CM | POA: Diagnosis not present

## 2020-02-22 DIAGNOSIS — I10 Essential (primary) hypertension: Secondary | ICD-10-CM | POA: Diagnosis not present

## 2020-02-22 DIAGNOSIS — I498 Other specified cardiac arrhythmias: Secondary | ICD-10-CM | POA: Diagnosis not present

## 2020-02-22 DIAGNOSIS — E785 Hyperlipidemia, unspecified: Secondary | ICD-10-CM | POA: Diagnosis not present

## 2020-02-23 DIAGNOSIS — N184 Chronic kidney disease, stage 4 (severe): Secondary | ICD-10-CM | POA: Diagnosis not present

## 2020-02-23 DIAGNOSIS — I5032 Chronic diastolic (congestive) heart failure: Secondary | ICD-10-CM | POA: Diagnosis not present

## 2020-02-23 DIAGNOSIS — Z6836 Body mass index (BMI) 36.0-36.9, adult: Secondary | ICD-10-CM | POA: Diagnosis not present

## 2020-02-23 DIAGNOSIS — I509 Heart failure, unspecified: Secondary | ICD-10-CM | POA: Diagnosis not present

## 2020-02-24 DIAGNOSIS — M797 Fibromyalgia: Secondary | ICD-10-CM | POA: Diagnosis not present

## 2020-02-24 DIAGNOSIS — E669 Obesity, unspecified: Secondary | ICD-10-CM | POA: Diagnosis not present

## 2020-02-24 DIAGNOSIS — M0579 Rheumatoid arthritis with rheumatoid factor of multiple sites without organ or systems involvement: Secondary | ICD-10-CM | POA: Diagnosis not present

## 2020-02-24 DIAGNOSIS — R7612 Nonspecific reaction to cell mediated immunity measurement of gamma interferon antigen response without active tuberculosis: Secondary | ICD-10-CM | POA: Diagnosis not present

## 2020-02-24 DIAGNOSIS — Z6832 Body mass index (BMI) 32.0-32.9, adult: Secondary | ICD-10-CM | POA: Diagnosis not present

## 2020-02-24 DIAGNOSIS — M150 Primary generalized (osteo)arthritis: Secondary | ICD-10-CM | POA: Diagnosis not present

## 2020-02-25 DIAGNOSIS — N184 Chronic kidney disease, stage 4 (severe): Secondary | ICD-10-CM | POA: Diagnosis not present

## 2020-02-25 DIAGNOSIS — D631 Anemia in chronic kidney disease: Secondary | ICD-10-CM | POA: Diagnosis not present

## 2020-02-25 DIAGNOSIS — N2581 Secondary hyperparathyroidism of renal origin: Secondary | ICD-10-CM | POA: Diagnosis not present

## 2020-02-25 DIAGNOSIS — I129 Hypertensive chronic kidney disease with stage 1 through stage 4 chronic kidney disease, or unspecified chronic kidney disease: Secondary | ICD-10-CM | POA: Diagnosis not present

## 2020-03-01 DIAGNOSIS — Z6836 Body mass index (BMI) 36.0-36.9, adult: Secondary | ICD-10-CM | POA: Diagnosis not present

## 2020-03-01 DIAGNOSIS — D631 Anemia in chronic kidney disease: Secondary | ICD-10-CM | POA: Diagnosis not present

## 2020-03-01 DIAGNOSIS — N189 Chronic kidney disease, unspecified: Secondary | ICD-10-CM | POA: Diagnosis not present

## 2020-03-01 DIAGNOSIS — I5032 Chronic diastolic (congestive) heart failure: Secondary | ICD-10-CM | POA: Diagnosis not present

## 2020-03-03 DIAGNOSIS — I129 Hypertensive chronic kidney disease with stage 1 through stage 4 chronic kidney disease, or unspecified chronic kidney disease: Secondary | ICD-10-CM | POA: Diagnosis not present

## 2020-03-04 DIAGNOSIS — N189 Chronic kidney disease, unspecified: Secondary | ICD-10-CM | POA: Diagnosis not present

## 2020-03-04 DIAGNOSIS — I5032 Chronic diastolic (congestive) heart failure: Secondary | ICD-10-CM | POA: Diagnosis not present

## 2020-03-04 DIAGNOSIS — I129 Hypertensive chronic kidney disease with stage 1 through stage 4 chronic kidney disease, or unspecified chronic kidney disease: Secondary | ICD-10-CM | POA: Diagnosis not present

## 2020-03-04 DIAGNOSIS — D631 Anemia in chronic kidney disease: Secondary | ICD-10-CM | POA: Diagnosis not present

## 2020-03-09 DIAGNOSIS — E538 Deficiency of other specified B group vitamins: Secondary | ICD-10-CM | POA: Diagnosis not present

## 2020-03-09 DIAGNOSIS — N189 Chronic kidney disease, unspecified: Secondary | ICD-10-CM | POA: Diagnosis not present

## 2020-03-09 DIAGNOSIS — D631 Anemia in chronic kidney disease: Secondary | ICD-10-CM | POA: Diagnosis not present

## 2020-03-10 DIAGNOSIS — M0589 Other rheumatoid arthritis with rheumatoid factor of multiple sites: Secondary | ICD-10-CM | POA: Diagnosis not present

## 2020-03-10 DIAGNOSIS — E785 Hyperlipidemia, unspecified: Secondary | ICD-10-CM | POA: Diagnosis not present

## 2020-03-10 DIAGNOSIS — I255 Ischemic cardiomyopathy: Secondary | ICD-10-CM | POA: Diagnosis not present

## 2020-03-10 DIAGNOSIS — I10 Essential (primary) hypertension: Secondary | ICD-10-CM | POA: Diagnosis not present

## 2020-03-10 DIAGNOSIS — Z7901 Long term (current) use of anticoagulants: Secondary | ICD-10-CM | POA: Diagnosis not present

## 2020-03-10 DIAGNOSIS — I498 Other specified cardiac arrhythmias: Secondary | ICD-10-CM | POA: Diagnosis not present

## 2020-03-10 DIAGNOSIS — R9431 Abnormal electrocardiogram [ECG] [EKG]: Secondary | ICD-10-CM | POA: Diagnosis not present

## 2020-03-10 DIAGNOSIS — I482 Chronic atrial fibrillation, unspecified: Secondary | ICD-10-CM | POA: Diagnosis not present

## 2020-03-10 DIAGNOSIS — Z1379 Encounter for other screening for genetic and chromosomal anomalies: Secondary | ICD-10-CM | POA: Diagnosis not present

## 2020-03-10 DIAGNOSIS — I200 Unstable angina: Secondary | ICD-10-CM | POA: Diagnosis not present

## 2020-03-15 DIAGNOSIS — I10 Essential (primary) hypertension: Secondary | ICD-10-CM | POA: Diagnosis not present

## 2020-03-15 DIAGNOSIS — G4733 Obstructive sleep apnea (adult) (pediatric): Secondary | ICD-10-CM | POA: Diagnosis not present

## 2020-03-15 DIAGNOSIS — J22 Unspecified acute lower respiratory infection: Secondary | ICD-10-CM | POA: Diagnosis not present

## 2020-03-16 DIAGNOSIS — M069 Rheumatoid arthritis, unspecified: Secondary | ICD-10-CM | POA: Diagnosis not present

## 2020-03-16 DIAGNOSIS — J40 Bronchitis, not specified as acute or chronic: Secondary | ICD-10-CM | POA: Diagnosis not present

## 2020-03-16 DIAGNOSIS — D509 Iron deficiency anemia, unspecified: Secondary | ICD-10-CM | POA: Diagnosis not present

## 2020-03-16 DIAGNOSIS — N3289 Other specified disorders of bladder: Secondary | ICD-10-CM | POA: Diagnosis not present

## 2020-03-16 DIAGNOSIS — I5032 Chronic diastolic (congestive) heart failure: Secondary | ICD-10-CM | POA: Diagnosis not present

## 2020-03-16 DIAGNOSIS — G4733 Obstructive sleep apnea (adult) (pediatric): Secondary | ICD-10-CM | POA: Diagnosis not present

## 2020-04-01 DIAGNOSIS — I129 Hypertensive chronic kidney disease with stage 1 through stage 4 chronic kidney disease, or unspecified chronic kidney disease: Secondary | ICD-10-CM | POA: Diagnosis not present

## 2020-04-04 DIAGNOSIS — N189 Chronic kidney disease, unspecified: Secondary | ICD-10-CM | POA: Diagnosis not present

## 2020-04-04 DIAGNOSIS — I5032 Chronic diastolic (congestive) heart failure: Secondary | ICD-10-CM | POA: Diagnosis not present

## 2020-04-04 DIAGNOSIS — M069 Rheumatoid arthritis, unspecified: Secondary | ICD-10-CM | POA: Diagnosis not present

## 2020-04-04 DIAGNOSIS — I129 Hypertensive chronic kidney disease with stage 1 through stage 4 chronic kidney disease, or unspecified chronic kidney disease: Secondary | ICD-10-CM | POA: Diagnosis not present

## 2020-04-06 DIAGNOSIS — N189 Chronic kidney disease, unspecified: Secondary | ICD-10-CM | POA: Diagnosis not present

## 2020-04-06 DIAGNOSIS — D631 Anemia in chronic kidney disease: Secondary | ICD-10-CM | POA: Diagnosis not present

## 2020-04-06 DIAGNOSIS — E538 Deficiency of other specified B group vitamins: Secondary | ICD-10-CM | POA: Diagnosis not present

## 2020-04-07 DIAGNOSIS — Z79899 Other long term (current) drug therapy: Secondary | ICD-10-CM | POA: Diagnosis not present

## 2020-04-07 DIAGNOSIS — M0589 Other rheumatoid arthritis with rheumatoid factor of multiple sites: Secondary | ICD-10-CM | POA: Diagnosis not present

## 2020-04-19 DIAGNOSIS — E1169 Type 2 diabetes mellitus with other specified complication: Secondary | ICD-10-CM | POA: Diagnosis not present

## 2020-04-19 DIAGNOSIS — N184 Chronic kidney disease, stage 4 (severe): Secondary | ICD-10-CM | POA: Diagnosis not present

## 2020-05-03 DIAGNOSIS — R300 Dysuria: Secondary | ICD-10-CM | POA: Diagnosis not present

## 2020-05-20 DIAGNOSIS — S6992XA Unspecified injury of left wrist, hand and finger(s), initial encounter: Secondary | ICD-10-CM | POA: Diagnosis not present

## 2020-05-20 DIAGNOSIS — M66241 Spontaneous rupture of extensor tendons, right hand: Secondary | ICD-10-CM | POA: Diagnosis not present

## 2020-05-20 DIAGNOSIS — M65331 Trigger finger, right middle finger: Secondary | ICD-10-CM | POA: Diagnosis not present

## 2020-05-26 DIAGNOSIS — I129 Hypertensive chronic kidney disease with stage 1 through stage 4 chronic kidney disease, or unspecified chronic kidney disease: Secondary | ICD-10-CM | POA: Diagnosis not present

## 2020-05-26 DIAGNOSIS — N189 Chronic kidney disease, unspecified: Secondary | ICD-10-CM | POA: Diagnosis not present

## 2020-05-26 DIAGNOSIS — D631 Anemia in chronic kidney disease: Secondary | ICD-10-CM | POA: Diagnosis not present

## 2020-05-26 DIAGNOSIS — I5032 Chronic diastolic (congestive) heart failure: Secondary | ICD-10-CM | POA: Diagnosis not present

## 2020-05-26 DIAGNOSIS — E1122 Type 2 diabetes mellitus with diabetic chronic kidney disease: Secondary | ICD-10-CM | POA: Diagnosis not present

## 2020-05-26 DIAGNOSIS — N184 Chronic kidney disease, stage 4 (severe): Secondary | ICD-10-CM | POA: Diagnosis not present

## 2020-05-27 DIAGNOSIS — N183 Chronic kidney disease, stage 3 unspecified: Secondary | ICD-10-CM | POA: Diagnosis not present

## 2020-05-27 DIAGNOSIS — I129 Hypertensive chronic kidney disease with stage 1 through stage 4 chronic kidney disease, or unspecified chronic kidney disease: Secondary | ICD-10-CM | POA: Diagnosis not present

## 2020-05-27 DIAGNOSIS — I351 Nonrheumatic aortic (valve) insufficiency: Secondary | ICD-10-CM | POA: Diagnosis not present

## 2020-05-27 DIAGNOSIS — I480 Paroxysmal atrial fibrillation: Secondary | ICD-10-CM | POA: Diagnosis not present

## 2020-05-27 DIAGNOSIS — I350 Nonrheumatic aortic (valve) stenosis: Secondary | ICD-10-CM | POA: Diagnosis not present

## 2020-05-27 DIAGNOSIS — I484 Atypical atrial flutter: Secondary | ICD-10-CM | POA: Diagnosis not present

## 2020-05-27 DIAGNOSIS — E1142 Type 2 diabetes mellitus with diabetic polyneuropathy: Secondary | ICD-10-CM | POA: Diagnosis not present

## 2020-05-27 DIAGNOSIS — E782 Mixed hyperlipidemia: Secondary | ICD-10-CM | POA: Diagnosis not present

## 2020-05-27 DIAGNOSIS — I495 Sick sinus syndrome: Secondary | ICD-10-CM | POA: Diagnosis not present

## 2020-05-27 DIAGNOSIS — I491 Atrial premature depolarization: Secondary | ICD-10-CM | POA: Diagnosis not present

## 2020-05-27 DIAGNOSIS — I4819 Other persistent atrial fibrillation: Secondary | ICD-10-CM | POA: Diagnosis not present

## 2020-05-31 DIAGNOSIS — I4510 Unspecified right bundle-branch block: Secondary | ICD-10-CM | POA: Diagnosis not present

## 2020-05-31 DIAGNOSIS — I491 Atrial premature depolarization: Secondary | ICD-10-CM | POA: Diagnosis not present

## 2020-06-01 DIAGNOSIS — N189 Chronic kidney disease, unspecified: Secondary | ICD-10-CM | POA: Diagnosis not present

## 2020-06-01 DIAGNOSIS — D631 Anemia in chronic kidney disease: Secondary | ICD-10-CM | POA: Diagnosis not present

## 2020-06-01 DIAGNOSIS — D500 Iron deficiency anemia secondary to blood loss (chronic): Secondary | ICD-10-CM | POA: Diagnosis not present

## 2020-06-01 DIAGNOSIS — K922 Gastrointestinal hemorrhage, unspecified: Secondary | ICD-10-CM | POA: Diagnosis not present

## 2020-06-04 DIAGNOSIS — I129 Hypertensive chronic kidney disease with stage 1 through stage 4 chronic kidney disease, or unspecified chronic kidney disease: Secondary | ICD-10-CM | POA: Diagnosis not present

## 2020-06-05 DIAGNOSIS — E1122 Type 2 diabetes mellitus with diabetic chronic kidney disease: Secondary | ICD-10-CM | POA: Diagnosis not present

## 2020-06-05 DIAGNOSIS — N189 Chronic kidney disease, unspecified: Secondary | ICD-10-CM | POA: Diagnosis not present

## 2020-06-05 DIAGNOSIS — D631 Anemia in chronic kidney disease: Secondary | ICD-10-CM | POA: Diagnosis not present

## 2020-06-05 DIAGNOSIS — I5032 Chronic diastolic (congestive) heart failure: Secondary | ICD-10-CM | POA: Diagnosis not present

## 2020-06-09 DIAGNOSIS — D692 Other nonthrombocytopenic purpura: Secondary | ICD-10-CM | POA: Diagnosis not present

## 2020-06-09 DIAGNOSIS — I5032 Chronic diastolic (congestive) heart failure: Secondary | ICD-10-CM | POA: Diagnosis not present

## 2020-06-09 DIAGNOSIS — N189 Chronic kidney disease, unspecified: Secondary | ICD-10-CM | POA: Diagnosis not present

## 2020-06-09 DIAGNOSIS — D631 Anemia in chronic kidney disease: Secondary | ICD-10-CM | POA: Diagnosis not present

## 2020-06-16 DIAGNOSIS — M0589 Other rheumatoid arthritis with rheumatoid factor of multiple sites: Secondary | ICD-10-CM | POA: Diagnosis not present

## 2020-06-17 DIAGNOSIS — M66241 Spontaneous rupture of extensor tendons, right hand: Secondary | ICD-10-CM | POA: Diagnosis not present

## 2020-06-20 ENCOUNTER — Other Ambulatory Visit: Payer: Self-pay | Admitting: Orthopedic Surgery

## 2020-06-29 DIAGNOSIS — N189 Chronic kidney disease, unspecified: Secondary | ICD-10-CM | POA: Diagnosis not present

## 2020-06-29 DIAGNOSIS — E538 Deficiency of other specified B group vitamins: Secondary | ICD-10-CM | POA: Diagnosis not present

## 2020-06-29 DIAGNOSIS — D631 Anemia in chronic kidney disease: Secondary | ICD-10-CM | POA: Diagnosis not present

## 2020-07-01 DIAGNOSIS — N189 Chronic kidney disease, unspecified: Secondary | ICD-10-CM | POA: Diagnosis not present

## 2020-07-01 DIAGNOSIS — R339 Retention of urine, unspecified: Secondary | ICD-10-CM | POA: Diagnosis not present

## 2020-07-01 DIAGNOSIS — I5032 Chronic diastolic (congestive) heart failure: Secondary | ICD-10-CM | POA: Diagnosis not present

## 2020-07-01 DIAGNOSIS — D631 Anemia in chronic kidney disease: Secondary | ICD-10-CM | POA: Diagnosis not present

## 2020-07-04 DIAGNOSIS — I129 Hypertensive chronic kidney disease with stage 1 through stage 4 chronic kidney disease, or unspecified chronic kidney disease: Secondary | ICD-10-CM | POA: Diagnosis not present

## 2020-07-05 DIAGNOSIS — I5032 Chronic diastolic (congestive) heart failure: Secondary | ICD-10-CM | POA: Diagnosis not present

## 2020-07-05 DIAGNOSIS — N189 Chronic kidney disease, unspecified: Secondary | ICD-10-CM | POA: Diagnosis not present

## 2020-07-05 DIAGNOSIS — D631 Anemia in chronic kidney disease: Secondary | ICD-10-CM | POA: Diagnosis not present

## 2020-07-05 DIAGNOSIS — I129 Hypertensive chronic kidney disease with stage 1 through stage 4 chronic kidney disease, or unspecified chronic kidney disease: Secondary | ICD-10-CM | POA: Diagnosis not present

## 2020-07-22 ENCOUNTER — Encounter (HOSPITAL_BASED_OUTPATIENT_CLINIC_OR_DEPARTMENT_OTHER): Payer: Self-pay | Admitting: Orthopedic Surgery

## 2020-07-25 ENCOUNTER — Encounter (HOSPITAL_BASED_OUTPATIENT_CLINIC_OR_DEPARTMENT_OTHER)
Admission: RE | Admit: 2020-07-25 | Discharge: 2020-07-25 | Disposition: A | Discharge status: Home / Self Care | Payer: Medicare Other | Source: Ambulatory Visit | Attending: Orthopedic Surgery | Admitting: Orthopedic Surgery

## 2020-07-25 ENCOUNTER — Other Ambulatory Visit: Payer: Self-pay

## 2020-07-25 ENCOUNTER — Encounter (HOSPITAL_BASED_OUTPATIENT_CLINIC_OR_DEPARTMENT_OTHER): Payer: Self-pay | Admitting: Orthopedic Surgery

## 2020-07-25 ENCOUNTER — Other Ambulatory Visit (HOSPITAL_COMMUNITY)
Admission: RE | Admit: 2020-07-25 | Discharge: 2020-07-25 | Disposition: A | Discharge status: Home / Self Care | Payer: Medicare Other | Source: Ambulatory Visit | Attending: Orthopedic Surgery | Admitting: Orthopedic Surgery

## 2020-07-25 DIAGNOSIS — Z01810 Encounter for preprocedural cardiovascular examination: Secondary | ICD-10-CM | POA: Insufficient documentation

## 2020-07-25 DIAGNOSIS — Z20822 Contact with and (suspected) exposure to covid-19: Secondary | ICD-10-CM | POA: Diagnosis not present

## 2020-07-25 DIAGNOSIS — Z01812 Encounter for preprocedural laboratory examination: Secondary | ICD-10-CM | POA: Insufficient documentation

## 2020-07-25 LAB — BASIC METABOLIC PANEL
Anion gap: 9 (ref 5–15)
BUN: 47 mg/dL — ABNORMAL HIGH (ref 8–23)
CO2: 26 mmol/L (ref 22–32)
Calcium: 8.6 mg/dL — ABNORMAL LOW (ref 8.9–10.3)
Chloride: 103 mmol/L (ref 98–111)
Creatinine, Ser: 2.83 mg/dL — ABNORMAL HIGH (ref 0.44–1.00)
GFR calc Af Amer: 18 mL/min — ABNORMAL LOW (ref >60)
GFR calc non Af Amer: 15 mL/min — ABNORMAL LOW (ref >60)
Glucose, Bld: 106 mg/dL — ABNORMAL HIGH (ref 70–99)
Potassium: 5 mmol/L (ref 3.5–5.1)
Sodium: 138 mmol/L (ref 135–145)

## 2020-07-25 NOTE — Progress Notes (Signed)
Patient's chart and cardiac notes and testing reviewed with Dr Valma Cava, [place] for [facility].

## 2020-07-25 NOTE — Progress Notes (Signed)
EKG reviewed by Dr. Valma Cava, ok to proceed as planned with surgery at [facility].

## 2020-07-25 NOTE — Progress Notes (Signed)
Orders received from Dr Talbot Grumbling office to hold Eliquis x1d only. Gloria Best was called and given instructions and voiced her understanding to do so.

## 2020-07-25 NOTE — Progress Notes (Signed)

## 2020-07-26 LAB — SARS CORONAVIRUS 2 (TAT 6-24 HRS): SARS Coronavirus 2: NEGATIVE

## 2020-07-26 NOTE — Progress Notes (Addendum)
Creatinine results reviewed by Dr. Jenita Seashore, ok to proceed with surgery as planned at [facility]. Spoke with Suanne Marker at the [facility] to relay the results.

## 2020-07-27 DIAGNOSIS — R3915 Urgency of urination: Secondary | ICD-10-CM | POA: Diagnosis not present

## 2020-07-27 DIAGNOSIS — N3020 Other chronic cystitis without hematuria: Secondary | ICD-10-CM | POA: Diagnosis not present

## 2020-07-27 DIAGNOSIS — R350 Frequency of micturition: Secondary | ICD-10-CM | POA: Diagnosis not present

## 2020-07-28 ENCOUNTER — Ambulatory Visit (HOSPITAL_BASED_OUTPATIENT_CLINIC_OR_DEPARTMENT_OTHER): Admission: RE | Admit: 2020-07-28 | Payer: Medicare Other | Source: Home / Self Care | Admitting: Orthopedic Surgery

## 2020-07-28 SURGERY — TENDON REPAIR
Anesthesia: Choice | Laterality: Right

## 2020-08-01 DIAGNOSIS — R05 Cough: Secondary | ICD-10-CM | POA: Diagnosis not present

## 2020-08-01 DIAGNOSIS — R0602 Shortness of breath: Secondary | ICD-10-CM | POA: Diagnosis not present

## 2020-08-01 DIAGNOSIS — I517 Cardiomegaly: Secondary | ICD-10-CM | POA: Diagnosis not present

## 2020-08-01 DIAGNOSIS — N189 Chronic kidney disease, unspecified: Secondary | ICD-10-CM | POA: Diagnosis not present

## 2020-08-01 DIAGNOSIS — D631 Anemia in chronic kidney disease: Secondary | ICD-10-CM | POA: Diagnosis not present

## 2020-08-01 DIAGNOSIS — Z20822 Contact with and (suspected) exposure to covid-19: Secondary | ICD-10-CM | POA: Diagnosis not present

## 2020-08-01 DIAGNOSIS — I700 Atherosclerosis of aorta: Secondary | ICD-10-CM | POA: Diagnosis not present

## 2020-08-01 DIAGNOSIS — J441 Chronic obstructive pulmonary disease with (acute) exacerbation: Secondary | ICD-10-CM | POA: Diagnosis not present

## 2020-08-01 DIAGNOSIS — I5032 Chronic diastolic (congestive) heart failure: Secondary | ICD-10-CM | POA: Diagnosis not present

## 2020-08-03 DIAGNOSIS — D631 Anemia in chronic kidney disease: Secondary | ICD-10-CM | POA: Diagnosis not present

## 2020-08-03 DIAGNOSIS — E538 Deficiency of other specified B group vitamins: Secondary | ICD-10-CM | POA: Diagnosis not present

## 2020-08-03 DIAGNOSIS — N189 Chronic kidney disease, unspecified: Secondary | ICD-10-CM | POA: Diagnosis not present

## 2020-08-04 DIAGNOSIS — I129 Hypertensive chronic kidney disease with stage 1 through stage 4 chronic kidney disease, or unspecified chronic kidney disease: Secondary | ICD-10-CM | POA: Diagnosis not present

## 2020-08-05 DIAGNOSIS — N189 Chronic kidney disease, unspecified: Secondary | ICD-10-CM | POA: Diagnosis not present

## 2020-08-05 DIAGNOSIS — I129 Hypertensive chronic kidney disease with stage 1 through stage 4 chronic kidney disease, or unspecified chronic kidney disease: Secondary | ICD-10-CM | POA: Diagnosis not present

## 2020-08-05 DIAGNOSIS — I5032 Chronic diastolic (congestive) heart failure: Secondary | ICD-10-CM | POA: Diagnosis not present

## 2020-08-08 DIAGNOSIS — J441 Chronic obstructive pulmonary disease with (acute) exacerbation: Secondary | ICD-10-CM | POA: Diagnosis not present

## 2020-08-08 DIAGNOSIS — N3289 Other specified disorders of bladder: Secondary | ICD-10-CM | POA: Diagnosis not present

## 2020-08-08 DIAGNOSIS — Z6827 Body mass index (BMI) 27.0-27.9, adult: Secondary | ICD-10-CM | POA: Diagnosis not present

## 2020-08-11 ENCOUNTER — Other Ambulatory Visit: Payer: Self-pay

## 2020-08-15 ENCOUNTER — Other Ambulatory Visit (HOSPITAL_COMMUNITY)
Admission: RE | Admit: 2020-08-15 | Discharge: 2020-08-15 | Disposition: A | Discharge status: Home / Self Care | Payer: Medicare Other | Source: Ambulatory Visit | Attending: Orthopedic Surgery | Admitting: Orthopedic Surgery

## 2020-08-15 ENCOUNTER — Encounter (HOSPITAL_BASED_OUTPATIENT_CLINIC_OR_DEPARTMENT_OTHER)
Admission: RE | Admit: 2020-08-15 | Discharge: 2020-08-15 | Disposition: A | Discharge status: Home / Self Care | Payer: Medicare Other | Source: Ambulatory Visit | Attending: Orthopedic Surgery | Admitting: Orthopedic Surgery

## 2020-08-15 DIAGNOSIS — Z01812 Encounter for preprocedural laboratory examination: Secondary | ICD-10-CM | POA: Diagnosis not present

## 2020-08-15 DIAGNOSIS — Z01818 Encounter for other preprocedural examination: Secondary | ICD-10-CM | POA: Insufficient documentation

## 2020-08-15 DIAGNOSIS — Z20822 Contact with and (suspected) exposure to covid-19: Secondary | ICD-10-CM | POA: Insufficient documentation

## 2020-08-15 LAB — BASIC METABOLIC PANEL
Anion gap: 10 (ref 5–15)
BUN: 39 mg/dL — ABNORMAL HIGH (ref 8–23)
CO2: 23 mmol/L (ref 22–32)
Calcium: 8.4 mg/dL — ABNORMAL LOW (ref 8.9–10.3)
Chloride: 106 mmol/L (ref 98–111)
Creatinine, Ser: 2.57 mg/dL — ABNORMAL HIGH (ref 0.44–1.00)
GFR, Estimated: 17 mL/min — ABNORMAL LOW (ref >60)
Glucose, Bld: 124 mg/dL — ABNORMAL HIGH (ref 70–99)
Potassium: 5.3 mmol/L — ABNORMAL HIGH (ref 3.5–5.1)
Sodium: 139 mmol/L (ref 135–145)

## 2020-08-15 LAB — SARS CORONAVIRUS 2 (TAT 6-24 HRS): SARS Coronavirus 2: NEGATIVE

## 2020-08-16 ENCOUNTER — Other Ambulatory Visit: Payer: Self-pay | Admitting: Hematology and Oncology

## 2020-08-16 DIAGNOSIS — I358 Other nonrheumatic aortic valve disorders: Secondary | ICD-10-CM | POA: Diagnosis not present

## 2020-08-16 DIAGNOSIS — I480 Paroxysmal atrial fibrillation: Secondary | ICD-10-CM | POA: Diagnosis not present

## 2020-08-16 DIAGNOSIS — I10 Essential (primary) hypertension: Secondary | ICD-10-CM | POA: Diagnosis not present

## 2020-08-16 DIAGNOSIS — I491 Atrial premature depolarization: Secondary | ICD-10-CM | POA: Diagnosis not present

## 2020-08-16 DIAGNOSIS — I351 Nonrheumatic aortic (valve) insufficiency: Secondary | ICD-10-CM | POA: Diagnosis not present

## 2020-08-16 DIAGNOSIS — G4733 Obstructive sleep apnea (adult) (pediatric): Secondary | ICD-10-CM | POA: Diagnosis not present

## 2020-08-16 DIAGNOSIS — Z87891 Personal history of nicotine dependence: Secondary | ICD-10-CM | POA: Diagnosis not present

## 2020-08-16 DIAGNOSIS — E538 Deficiency of other specified B group vitamins: Secondary | ICD-10-CM | POA: Insufficient documentation

## 2020-08-16 DIAGNOSIS — I495 Sick sinus syndrome: Secondary | ICD-10-CM | POA: Diagnosis not present

## 2020-08-16 DIAGNOSIS — Z7982 Long term (current) use of aspirin: Secondary | ICD-10-CM | POA: Diagnosis not present

## 2020-08-16 DIAGNOSIS — E782 Mixed hyperlipidemia: Secondary | ICD-10-CM | POA: Diagnosis not present

## 2020-08-16 NOTE — Progress Notes (Signed)
Reviewed lab results per Dr. Christella Hartigan, pt needs to see PCP prior to surgery to address increased potassium and creatinine, Notified Elmo Putt at Dr. Levell July office.

## 2020-08-17 DIAGNOSIS — D509 Iron deficiency anemia, unspecified: Secondary | ICD-10-CM | POA: Diagnosis not present

## 2020-08-17 DIAGNOSIS — E875 Hyperkalemia: Secondary | ICD-10-CM | POA: Diagnosis not present

## 2020-08-17 DIAGNOSIS — Z6828 Body mass index (BMI) 28.0-28.9, adult: Secondary | ICD-10-CM | POA: Diagnosis not present

## 2020-08-17 DIAGNOSIS — I5032 Chronic diastolic (congestive) heart failure: Secondary | ICD-10-CM | POA: Diagnosis not present

## 2020-08-18 ENCOUNTER — Ambulatory Visit (HOSPITAL_BASED_OUTPATIENT_CLINIC_OR_DEPARTMENT_OTHER): Admission: RE | Admit: 2020-08-18 | Payer: Medicare Other | Source: Home / Self Care | Admitting: Orthopedic Surgery

## 2020-08-18 SURGERY — TENDON REPAIR
Anesthesia: Choice | Laterality: Right

## 2020-08-19 DIAGNOSIS — N3020 Other chronic cystitis without hematuria: Secondary | ICD-10-CM | POA: Diagnosis not present

## 2020-08-23 DIAGNOSIS — E875 Hyperkalemia: Secondary | ICD-10-CM | POA: Diagnosis not present

## 2020-08-23 DIAGNOSIS — I5032 Chronic diastolic (congestive) heart failure: Secondary | ICD-10-CM | POA: Diagnosis not present

## 2020-08-23 DIAGNOSIS — N184 Chronic kidney disease, stage 4 (severe): Secondary | ICD-10-CM | POA: Diagnosis not present

## 2020-08-23 DIAGNOSIS — Z23 Encounter for immunization: Secondary | ICD-10-CM | POA: Diagnosis not present

## 2020-08-24 ENCOUNTER — Inpatient Hospital Stay: Payer: Medicare Other | Attending: Oncology

## 2020-08-25 DIAGNOSIS — E669 Obesity, unspecified: Secondary | ICD-10-CM | POA: Diagnosis not present

## 2020-08-25 DIAGNOSIS — M150 Primary generalized (osteo)arthritis: Secondary | ICD-10-CM | POA: Diagnosis not present

## 2020-08-25 DIAGNOSIS — M797 Fibromyalgia: Secondary | ICD-10-CM | POA: Diagnosis not present

## 2020-08-25 DIAGNOSIS — R7612 Nonspecific reaction to cell mediated immunity measurement of gamma interferon antigen response without active tuberculosis: Secondary | ICD-10-CM | POA: Diagnosis not present

## 2020-08-25 DIAGNOSIS — Z6832 Body mass index (BMI) 32.0-32.9, adult: Secondary | ICD-10-CM | POA: Diagnosis not present

## 2020-08-25 DIAGNOSIS — M0579 Rheumatoid arthritis with rheumatoid factor of multiple sites without organ or systems involvement: Secondary | ICD-10-CM | POA: Diagnosis not present

## 2020-08-29 DIAGNOSIS — Z139 Encounter for screening, unspecified: Secondary | ICD-10-CM | POA: Diagnosis not present

## 2020-08-29 DIAGNOSIS — E1169 Type 2 diabetes mellitus with other specified complication: Secondary | ICD-10-CM | POA: Diagnosis not present

## 2020-08-29 DIAGNOSIS — D631 Anemia in chronic kidney disease: Secondary | ICD-10-CM | POA: Diagnosis not present

## 2020-08-29 DIAGNOSIS — Z23 Encounter for immunization: Secondary | ICD-10-CM | POA: Diagnosis not present

## 2020-08-29 DIAGNOSIS — Z1339 Encounter for screening examination for other mental health and behavioral disorders: Secondary | ICD-10-CM | POA: Diagnosis not present

## 2020-08-29 DIAGNOSIS — Z1331 Encounter for screening for depression: Secondary | ICD-10-CM | POA: Diagnosis not present

## 2020-08-29 DIAGNOSIS — Z0000 Encounter for general adult medical examination without abnormal findings: Secondary | ICD-10-CM | POA: Diagnosis not present

## 2020-08-29 DIAGNOSIS — N184 Chronic kidney disease, stage 4 (severe): Secondary | ICD-10-CM | POA: Diagnosis not present

## 2020-08-29 DIAGNOSIS — I5032 Chronic diastolic (congestive) heart failure: Secondary | ICD-10-CM | POA: Diagnosis not present

## 2020-08-29 DIAGNOSIS — N189 Chronic kidney disease, unspecified: Secondary | ICD-10-CM | POA: Diagnosis not present

## 2020-08-31 DIAGNOSIS — M0589 Other rheumatoid arthritis with rheumatoid factor of multiple sites: Secondary | ICD-10-CM | POA: Diagnosis not present

## 2020-09-04 DIAGNOSIS — I129 Hypertensive chronic kidney disease with stage 1 through stage 4 chronic kidney disease, or unspecified chronic kidney disease: Secondary | ICD-10-CM | POA: Diagnosis not present

## 2020-09-05 DIAGNOSIS — I5032 Chronic diastolic (congestive) heart failure: Secondary | ICD-10-CM | POA: Diagnosis not present

## 2020-09-05 DIAGNOSIS — N189 Chronic kidney disease, unspecified: Secondary | ICD-10-CM | POA: Diagnosis not present

## 2020-09-05 DIAGNOSIS — D631 Anemia in chronic kidney disease: Secondary | ICD-10-CM | POA: Diagnosis not present

## 2020-09-05 DIAGNOSIS — I129 Hypertensive chronic kidney disease with stage 1 through stage 4 chronic kidney disease, or unspecified chronic kidney disease: Secondary | ICD-10-CM | POA: Diagnosis not present

## 2020-09-08 ENCOUNTER — Other Ambulatory Visit: Payer: Self-pay | Admitting: Hematology and Oncology

## 2020-09-08 DIAGNOSIS — D500 Iron deficiency anemia secondary to blood loss (chronic): Secondary | ICD-10-CM

## 2020-09-08 DIAGNOSIS — N185 Chronic kidney disease, stage 5: Secondary | ICD-10-CM

## 2020-09-08 DIAGNOSIS — D631 Anemia in chronic kidney disease: Secondary | ICD-10-CM

## 2020-09-13 DIAGNOSIS — D631 Anemia in chronic kidney disease: Secondary | ICD-10-CM | POA: Diagnosis not present

## 2020-09-13 DIAGNOSIS — N189 Chronic kidney disease, unspecified: Secondary | ICD-10-CM | POA: Diagnosis not present

## 2020-09-13 DIAGNOSIS — I129 Hypertensive chronic kidney disease with stage 1 through stage 4 chronic kidney disease, or unspecified chronic kidney disease: Secondary | ICD-10-CM | POA: Diagnosis not present

## 2020-09-13 DIAGNOSIS — N2581 Secondary hyperparathyroidism of renal origin: Secondary | ICD-10-CM | POA: Diagnosis not present

## 2020-09-13 DIAGNOSIS — N184 Chronic kidney disease, stage 4 (severe): Secondary | ICD-10-CM | POA: Diagnosis not present

## 2020-09-15 ENCOUNTER — Other Ambulatory Visit: Payer: Self-pay | Admitting: Orthopedic Surgery

## 2020-09-19 NOTE — Progress Notes (Signed)
[facility]  [address] [place],  [place]  [address] [phone]  Clinic Day:  09/21/2020  Referring physician: Marco Collie, MD   This document serves as a record of services personally performed by Hosie Poisson, MD. It was created on their behalf by Curry,Lauren E, a trained medical scribe. The creation of this record is based on the scribe's personal observations and the provider's statements to them.   CHIEF COMPLAINT:  CC: Anemia of chronic disease  Current Treatment:  Oral iron supplement and monthly B12 injections   HISTORY OF PRESENT ILLNESS:  Gloria Best is a 79 y.o. female with anemia of chronic disease and chronic kidney disease, who also has B12 deficiency and intermittent iron deficiency.  She previously received Procrit, but has not had this since 2008.  She has been on and off B12 injections monthly.  She has occasionally required transfusion, as well as intravenous iron.  Evaluation revealed iron deficiency, which is felt to be due to chronic gastrointestinal blood loss.  She has had Barrett's esophagus and underwent fundoplication previously.  She has a previous history of right hemicolectomy for benign disease.  EGD and colonoscopy in February 2015 revealed ulceration at the anastomosis.  She had been on Pradaxa due to atrial fibrillation.  She also has rheumatoid arthritis, with associated chronic leukocytosis.  She is also on Remicade and Plaquenil for the rheumatoid arthritis.  In September 2017, she had recurrent iron deficiency with a decrease in her hemoglobin to 8.6.  She received IV Feraheme again at that time.  In October, her hemoglobin was back up to 10.3.    She was seen in March 2018, after she contacted [place] stating her hemoglobin had dropped down to 8.6.  She had a right knee replacement in February and went to a rehabilitation facility postoperatively.  She reported hematemesis, as well as heme-positive stools  at the facility.  She was readmitted to [facility] from rehab in February 2018, apparently with a urinary tract infection, as well as atrial fibrillation and had recurrent anemia.  An EGD during that admission, apparently did not reveal any sites of bleeding.  Iron studies were equivocal, so a soluble transferrin was obtained and was normal.  B12 was also normal, so no IV iron or B12 supplementation was recommended.  In May 2018, she was found to have recurrent iron deficiency despite oral iron supplementation.  This was felt to most likely be due to chronic gastrointestinal blood loss.  She received a transfusion of 2 units of packed red blood cells after her hemoglobin dropped down to 7.6.Marland Kitchen  She also received IV Feraheme.  She saw Dr. Lyda Jester again and he repeated an EGD in May which did not reveal any abnormality or source of significant GI blood loss.  Her hemoglobin was 8.9 the day of the EGD.    She received 2 units of packed red blood cells again in June 2018, when her hemoglobin dropped to 8 and she was symptomatic.  Evaluation of her anemia revealed recurrent B12 deficiency with a low normal B12 level, but a significantly elevated MMA.  Iron studies were normal.  She was started back on monthly B12 injections in mid June 2018.  She was also significantly dehydrated, with worsening chronic kidney disease, so furosemide 40 mg daily was placed on hold and she was given gentle IV hydration.  We also referred her to a nephrologist, as her creatinine was 2.6 in June and 2.2 in  July.  Unfortunately, she was hospitalized again in July with worsening anemia, GERD, and CHF exacerbation.  She received Feraheme 100mg  IV while in the hospital.  Dr. Lyda Jester saw her and performed a colonoscopy, which did show an ulceration of the small bowel.  Pathology reveals inflamed intestinal mucosa with minimal villous morphology and focal acute ulceration.  She was placed on pantoprazole 40 mg twice daily.  Her  hemoglobin was 9.1 at the time of discharge from the hospital, but dropped down to 6.7 in August and she received 2 units of packed red blood cells.  Iron studies were not consistent with iron deficiency with an elevated ferritin of 589 and decreased TIBC of 242.  Pradaxa was discontinued due to the gastrointestinal blood loss.  At her visit later in August, her hemoglobin was 10.3.  She saw Dr. Posey Pronto at [facility] in September 2018 for chronic kidney disease.  Labs from Dr. Nyra Capes office in October 2018 revealed a hemoglobin of 10.6.  She was hospitalized again in October with severe abdominal pain with intractable nausea and vomiting.  She had a bowel obstruction, which resolved with bowel rest.  She also had a urinary tract infection.  She did not require transfusion during her hospitalization.  There was concern for mesenteric ischemia, but CT abdomen and pelvis angiogram did not did not definitely confirm this.  There was, thickening of the posterior lateral wall of the bladder concerning for malignancy.  Her hemoglobin was 9.8 at discharge.  She saw Dr. Comer Locket later in October and underwent cystoscopy.  She states there was a "red spot" and he recommend repeat cystoscopy to follow up.  Her hemoglobin continued to slowly improve and had been fairly stable, running above 11, since February 2019.  She had been off iron supplement.  She continued B12 injections monthly.  There was a suspicious lesion of the left upper anterior chest wall, so she was referred to dermatologist in June 2019 and underwent removal of a skin cancer from the left anterior chest wall.  She continues to follow up with Dr. Nyra Capes regularly for her heart failure.  She continues to follow with Dr. Posey Pronto regarding her kidney failure.  Screening mammogram in August 2019 did not reveal any evidence of malignancy.  In December 2019, her hemoglobin had dropped down to 10.3, but was up to 11 by the end of March 2020.  She was seen for  routine follow-up in June, her hemoglobin was 12.8.  She was referred back in July when her hemoglobin was 9.4 at Dr. Meta Hatchet and Dr. Serita Grit offices.  Her hemoglobin in our office was 11 8.  She did not have evidence of recurrent iron deficiency, so we continued monthly B12 injections. Stool Hemoccult were obtained and 2/3 were positive, so she was referred back to Dr. Lyda Jester.  EGD and colonoscopy in August did not reveal any significant source of blood loss, there was slightly friable gastric mucosa.  Internal and small external hemorrhoids were seen, as well as left colon diverticula.  Biopsy of the small bowel was negative.  Hemoglobin in September was 10.9.  She had a chest x-ray on October 5th due to abnormality on examination, which revealed unchanged subtle right basilar scarring or atelectasis and cardiomegaly.  Her hemoglobin in our office in October was 11.8.  She had been off blood thinners for about 3 years, and was started on apixaban by Dr. Ola Spurr in September due to atrial fibrillation.   INTERVAL HISTORY:  Matricia is here  for routine follow up prior to monthly B12.  She states that she has been well.  She continues oral iron supplement daily.  She follows with Dr. Nyra Capes and her nephrologist, Dr. Posey Pronto, routinely every 3 months.  Her hemoglobin has mildly decreased from 10.9 to 10.5, and her white count and platelets are normal.  Chemistries are unremarkable except for a calcium of 8.3, a BUN of 41, improved, and a creatinine of 2.7, previously 2.9.  Her  appetite is good, and she has gained 4 and 1/2 pounds since her last visit.  She denies fever, chills or other signs of infection.  She denies nausea, vomiting, bowel issues, or abdominal pain.  She denies sore throat, cough, dyspnea, or chest pain.   REVIEW OF SYSTEMS:  Review of Systems  All other systems reviewed and are negative.    VITALS:  Blood pressure (!) 149/65, pulse 71, temperature 98 F (36.7 C), temperature  source Oral, resp. rate 18, height 4\' 11"  (1.499 m), weight 171 lb 4.8 oz (77.7 kg), SpO2 92 %.  Wt Readings from Last 3 Encounters:  09/21/20 171 lb 4.8 oz (77.7 kg)  11/27/17 153 lb (69.4 kg)  11/25/17 153 lb 4 oz (69.5 kg)    Body mass index is 34.6 kg/m.  Performance status (ECOG): 1 - Symptomatic but completely ambulatory  PHYSICAL EXAM:  Physical Exam Constitutional:      General: She is not in acute distress.    Appearance: Normal appearance. She is normal weight.  HENT:     Head: Normocephalic and atraumatic.  Eyes:     General: No scleral icterus.    Extraocular Movements: Extraocular movements intact.     Conjunctiva/sclera: Conjunctivae normal.     Pupils: Pupils are equal, round, and reactive to light.  Cardiovascular:     Rate and Rhythm: Normal rate and regular rhythm.     Pulses: Normal pulses.     Heart sounds: Normal heart sounds. No murmur heard.  No friction rub. No gallop.   Pulmonary:     Effort: Pulmonary effort is normal. No respiratory distress.     Breath sounds: Normal breath sounds.  Abdominal:     General: Bowel sounds are normal. There is no distension.     Palpations: Abdomen is soft. There is no mass.     Tenderness: There is no abdominal tenderness.  Musculoskeletal:        General: Normal range of motion.     Cervical back: Normal range of motion and neck supple.     Right lower leg: No edema.     Left lower leg: No edema.     Comments: She has severe arthritic changes of the bilateral hands  Lymphadenopathy:     Cervical: No cervical adenopathy.  Skin:    General: Skin is warm and dry.  Neurological:     General: No focal deficit present.     Mental Status: She is alert and oriented to person, place, and time. Mental status is at baseline.  Psychiatric:        Mood and Affect: Mood normal.        Behavior: Behavior normal.        Thought Content: Thought content normal.        Judgment: Judgment normal.     LABS:   CBC Latest  Ref Rng & Units 09/21/2020 11/25/2017 12/27/2016  WBC - 8.8 23.0(H) 11.1(H)  Hemoglobin 12.0 - 16.0 10.5(A) 10.7(L) 9.1(L)  Hematocrit 36 - 46  32(A) 33.3(L) 28.4(L)  Platelets 150 - 399 220 252 486(H)   CMP Latest Ref Rng & Units 09/21/2020 08/15/2020 07/25/2020  Glucose 70 - 99 mg/dL - 124(H) 106(H)  BUN 4 - 21 41(A) 39(H) 47(H)  Creatinine 0.5 - 1.1 2.7(A) 2.57(H) 2.83(H)  Sodium 137 - 147 140 139 138  Potassium 3.4 - 5.3 4.6 5.3(H) 5.0  Chloride 99 - 108 104 106 103  CO2 13 - 22 26(A) 23 26  Calcium 8.7 - 10.7 8.3(A) 8.4(L) 8.6(L)  Total Protein 6.5 - 8.1 g/dL - - -  Total Bilirubin 0.3 - 1.2 mg/dL - - -  Alkaline Phos 25 - 125 69 - -  AST 13 - 35 26 - -  ALT 7 - 35 12 - -    No results found for: TIBC, FERRITIN, IRONPCTSAT    Lab Results  Component Value Date   LDH 202 03/07/2011     STUDIES:  No results found.   Allergies:  Allergies  Allergen Reactions  . Adhesive [Tape] Other (See Comments)    Tears skin  . Amlodipine Besylate Other (See Comments) and Itching  . Latex Other (See Comments)    Blisters  . Lotrel [Amlodipine Besy-Benazepril Hcl] Itching and Cough    Current Medications: Current Outpatient Medications  Medication Sig Dispense Refill  . dabigatran (PRADAXA) 75 MG CAPS capsule Take by mouth.    . diltiazem (TIAZAC) 180 MG 24 hr capsule Take by mouth.    . flecainide (TAMBOCOR) 50 MG tablet Take by mouth.    . phenazopyridine (PYRIDIUM) 100 MG tablet Take 1 tablet by mouth every 8 (eight) hours.    . tamsulosin (FLOMAX) 0.4 MG CAPS capsule Take by mouth.    Marland Kitchen acetaminophen (TYLENOL) 500 MG tablet Take 500 mg by mouth every 8 (eight) hours as needed for moderate pain or headache.     . albuterol (PROVENTIL) (2.5 MG/3ML) 0.083% nebulizer solution Take 2.5 mg by nebulization every 6 (six) hours as needed for wheezing or shortness of breath.    Marland Kitchen apixaban (ELIQUIS) 2.5 MG TABS tablet Take by mouth 2 (two) times daily.    . ARIPiprazole (ABILIFY) 5 MG  tablet Take 5 mg by mouth daily.     Marland Kitchen atorvastatin (LIPITOR) 80 MG tablet Take 80 mg by mouth daily at 6 PM.     . carvedilol (COREG) 6.25 MG tablet Take 6.25 mg by mouth 2 (two) times daily.     . cloNIDine (CATAPRES) 0.2 MG tablet Take by mouth.    . diphenhydrAMINE (BENADRYL) 25 mg capsule Take 25 mg by mouth every 30 (thirty) days.    Marland Kitchen escitalopram (LEXAPRO) 10 MG tablet Take 10 mg by mouth daily.    Marland Kitchen esomeprazole (NEXIUM) 40 MG capsule Take 40 mg by mouth daily at 12 noon.    . ferrous sulfate 325 (65 FE) MG tablet Take 1 tablet (325 mg total) by mouth 2 (two) times daily with a meal. (Patient taking differently: Take 325 mg by mouth daily with breakfast. ) 60 tablet 3  . folic acid (FOLVITE) 1 MG tablet Take 1 mg by mouth daily.     . furosemide (LASIX) 20 MG tablet Take 20 mg by mouth in the morning, at noon, and at bedtime.     . hydrALAZINE (APRESOLINE) 10 MG tablet Take 10 mg by mouth 3 (three) times daily.    . hydroxychloroquine (PLAQUENIL) 200 MG tablet Take 200 mg by mouth 2 (two) times daily.     Marland Kitchen  inFLIXimab (REMICADE) 100 MG injection Inject 100 mg into the vein every 30 (thirty) days.     Marland Kitchen ipratropium (ATROVENT HFA) 17 MCG/ACT inhaler Inhale into the lungs.    . isosorbide mononitrate (IMDUR) 60 MG 24 hr tablet Take 60 mg by mouth daily.    . methenamine (HIPREX) 1 g tablet Take 1 g by mouth 2 (two) times daily.    . metolazone (ZAROXOLYN) 2.5 MG tablet Take 2.5 mg by mouth daily.    Marland Kitchen MYRBETRIQ 50 MG TB24 tablet Take 50 mg by mouth daily.     . Omega-3 Fatty Acids (FISH OIL) 1000 MG CAPS Take 1,000 mg by mouth daily.    . pantoprazole (PROTONIX) 40 MG tablet Take 40 mg by mouth daily.    . polyethylene glycol (MIRALAX / GLYCOLAX) packet Take 17 g by mouth daily. (Patient taking differently: Take 17 g by mouth daily as needed for moderate constipation. ) 14 each 0  . Potassium Gluconate 2.5 MEQ TABS Take by mouth.    . pramipexole (MIRAPEX) 0.5 MG tablet Take 0.5 mg by  mouth at bedtime.    . pregabalin (LYRICA) 100 MG capsule Take 100 mg by mouth 3 (three) times daily.    . TRELEGY ELLIPTA 100-62.5-25 MCG/INH AEPB Inhale 1 puff into the lungs daily.    . Vitamin D, Ergocalciferol, (DRISDOL) 50000 units CAPS capsule Take 50,000 Units by mouth every 30 (thirty) days.     No current facility-administered medications for this visit.     ASSESSMENT & PLAN:   Assessment:   1.  Chronic anemia,  which is felt to be multifactorial, due to chronic kidney disease and probable GI blood loss.  Iron studies in the past have been consistent with anemia of chronic disease.  Her hemoglobin continues to fluctuate up and down with an MCV of 90 today.  If her hemoglobin drops below 10, we could consider Procrit.  She is currently on oral iron daily and B12 injections monthly.  2.  Chronic kidney disease.  Her creatinine is fairly stable.  Plan: She knows to continue oral iron daily.  We will administer B12 injection today and continue this every 4 weeks.  We will plan to see her back in 6 months with a CBC, comprehensive metabolic panel, and iron studies.  The patient understands the plans discussed today and is in agreement with them.  She knows to contact our office if she develops symptoms of worsening anemia.   I provided 15 minutes of face-to-face time during this this encounter and > 50% was spent counseling as documented under my assessment and plan.    Derwood Kaplan, MD [facility] AT [facility] [address] [place] [address] Dept: [phone] Dept Fax: [phone]   I, Rita Ohara, am acting as scribe for Derwood Kaplan, MD  I have reviewed this report as typed by the medical scribe, and it is complete and accurate.

## 2020-09-21 ENCOUNTER — Inpatient Hospital Stay: Payer: Medicare Other

## 2020-09-21 ENCOUNTER — Inpatient Hospital Stay (INDEPENDENT_AMBULATORY_CARE_PROVIDER_SITE_OTHER): Payer: Medicare Other | Admitting: Oncology

## 2020-09-21 ENCOUNTER — Inpatient Hospital Stay: Payer: Medicare Other | Attending: Oncology | Admitting: Hematology and Oncology

## 2020-09-21 ENCOUNTER — Telehealth: Payer: Self-pay | Admitting: Oncology

## 2020-09-21 ENCOUNTER — Other Ambulatory Visit: Payer: Self-pay | Admitting: Oncology

## 2020-09-21 ENCOUNTER — Encounter: Payer: Self-pay | Admitting: Oncology

## 2020-09-21 ENCOUNTER — Other Ambulatory Visit: Payer: Self-pay

## 2020-09-21 DIAGNOSIS — D631 Anemia in chronic kidney disease: Secondary | ICD-10-CM

## 2020-09-21 DIAGNOSIS — D500 Iron deficiency anemia secondary to blood loss (chronic): Secondary | ICD-10-CM

## 2020-09-21 DIAGNOSIS — N189 Chronic kidney disease, unspecified: Secondary | ICD-10-CM | POA: Insufficient documentation

## 2020-09-21 DIAGNOSIS — E538 Deficiency of other specified B group vitamins: Secondary | ICD-10-CM | POA: Insufficient documentation

## 2020-09-21 DIAGNOSIS — N184 Chronic kidney disease, stage 4 (severe): Secondary | ICD-10-CM | POA: Diagnosis not present

## 2020-09-21 DIAGNOSIS — Z0000 Encounter for general adult medical examination without abnormal findings: Secondary | ICD-10-CM | POA: Diagnosis not present

## 2020-09-21 LAB — COMPREHENSIVE METABOLIC PANEL
Albumin: 3.5 (ref 3.5–5.0)
Calcium: 8.3 — AB (ref 8.7–10.7)

## 2020-09-21 LAB — BASIC METABOLIC PANEL
BUN: 41 — AB (ref 4–21)
CO2: 26 — AB (ref 13–22)
Chloride: 104 (ref 99–108)
Creatinine: 2.7 — AB (ref 0.5–1.1)
Glucose: 119
Potassium: 4.6 (ref 3.4–5.3)
Sodium: 140 (ref 137–147)

## 2020-09-21 LAB — CBC AND DIFFERENTIAL
HCT: 32 — AB (ref 36–46)
Hemoglobin: 10.5 — AB (ref 12.0–16.0)
Neutrophils Absolute: 4.58
Platelets: 220 (ref 150–399)
WBC: 8.8

## 2020-09-21 LAB — HEPATIC FUNCTION PANEL
ALT: 12 (ref 7–35)
AST: 26 (ref 13–35)
Alkaline Phosphatase: 69 (ref 25–125)
Bilirubin, Total: 0.4

## 2020-09-21 LAB — CBC: RBC: 3.58 — AB (ref 3.87–5.11)

## 2020-09-21 NOTE — Addendum Note (Signed)
Addended by: Juanetta Beets on: 09/21/2020 02:46 PM   Modules accepted: Orders

## 2020-09-21 NOTE — Telephone Encounter (Signed)
Patient requested to rescheduled 11/17 B12 until 11/18 - Scheduled 11/18 gave appt time to patient

## 2020-09-22 ENCOUNTER — Telehealth: Payer: Self-pay | Admitting: Oncology

## 2020-09-22 ENCOUNTER — Inpatient Hospital Stay: Payer: Medicare Other

## 2020-09-22 NOTE — Telephone Encounter (Signed)
Patient Reschedule B12 Injection to 11/19 due to no transportation

## 2020-09-22 NOTE — Telephone Encounter (Signed)
Patient called to Rescheduled today's B12 Injection to tomorrow.  She has no transportation today

## 2020-09-23 ENCOUNTER — Other Ambulatory Visit: Payer: Self-pay

## 2020-09-23 ENCOUNTER — Inpatient Hospital Stay: Payer: Medicare Other

## 2020-09-23 ENCOUNTER — Other Ambulatory Visit: Payer: Self-pay | Admitting: Pharmacist

## 2020-09-23 VITALS — BP 147/53 | HR 67 | Temp 98.2°F | Resp 20 | Ht 59.0 in | Wt 172.5 lb

## 2020-09-23 DIAGNOSIS — E538 Deficiency of other specified B group vitamins: Secondary | ICD-10-CM

## 2020-09-23 MED ORDER — CYANOCOBALAMIN 1000 MCG/ML IJ SOLN
1000.0000 ug | Freq: Once | INTRAMUSCULAR | Status: AC
  Administered 2020-09-23: 1000 ug via INTRAMUSCULAR

## 2020-09-23 MED ORDER — CYANOCOBALAMIN 1000 MCG/ML IJ SOLN
INTRAMUSCULAR | Status: AC
  Filled 2020-09-23: qty 1

## 2020-09-23 NOTE — Patient Instructions (Signed)

## 2020-09-23 NOTE — Progress Notes (Signed)
Pt stable at time of discharge, steady ambullation with use of walker.

## 2020-09-26 DIAGNOSIS — E1122 Type 2 diabetes mellitus with diabetic chronic kidney disease: Secondary | ICD-10-CM | POA: Diagnosis not present

## 2020-09-26 DIAGNOSIS — I5032 Chronic diastolic (congestive) heart failure: Secondary | ICD-10-CM | POA: Diagnosis not present

## 2020-09-26 DIAGNOSIS — I129 Hypertensive chronic kidney disease with stage 1 through stage 4 chronic kidney disease, or unspecified chronic kidney disease: Secondary | ICD-10-CM | POA: Diagnosis not present

## 2020-09-26 DIAGNOSIS — D631 Anemia in chronic kidney disease: Secondary | ICD-10-CM | POA: Diagnosis not present

## 2020-09-26 DIAGNOSIS — N189 Chronic kidney disease, unspecified: Secondary | ICD-10-CM | POA: Diagnosis not present

## 2020-09-26 DIAGNOSIS — N184 Chronic kidney disease, stage 4 (severe): Secondary | ICD-10-CM | POA: Diagnosis not present

## 2020-09-26 DIAGNOSIS — Z139 Encounter for screening, unspecified: Secondary | ICD-10-CM | POA: Diagnosis not present

## 2020-10-04 DIAGNOSIS — I129 Hypertensive chronic kidney disease with stage 1 through stage 4 chronic kidney disease, or unspecified chronic kidney disease: Secondary | ICD-10-CM | POA: Diagnosis not present

## 2020-10-05 DIAGNOSIS — I129 Hypertensive chronic kidney disease with stage 1 through stage 4 chronic kidney disease, or unspecified chronic kidney disease: Secondary | ICD-10-CM | POA: Diagnosis not present

## 2020-10-05 DIAGNOSIS — N184 Chronic kidney disease, stage 4 (severe): Secondary | ICD-10-CM | POA: Diagnosis not present

## 2020-10-05 DIAGNOSIS — I5032 Chronic diastolic (congestive) heart failure: Secondary | ICD-10-CM | POA: Diagnosis not present

## 2020-10-05 DIAGNOSIS — E1122 Type 2 diabetes mellitus with diabetic chronic kidney disease: Secondary | ICD-10-CM | POA: Diagnosis not present

## 2020-10-06 ENCOUNTER — Other Ambulatory Visit: Payer: Self-pay

## 2020-10-06 ENCOUNTER — Encounter (HOSPITAL_COMMUNITY): Payer: Self-pay | Admitting: Anesthesiology

## 2020-10-06 ENCOUNTER — Encounter (HOSPITAL_BASED_OUTPATIENT_CLINIC_OR_DEPARTMENT_OTHER): Payer: Self-pay | Admitting: Orthopedic Surgery

## 2020-10-06 NOTE — Progress Notes (Signed)
Reviewed chart with Dr. Elgie Congo at [facility]. Pt has has pcp follow up, cards clearance, and will come for updated BMP per our guidelines before surgery. Dr. Elgie Congo aware her baseline creatine is 2-2.7.

## 2020-10-08 DIAGNOSIS — R52 Pain, unspecified: Secondary | ICD-10-CM | POA: Diagnosis not present

## 2020-10-08 DIAGNOSIS — M069 Rheumatoid arthritis, unspecified: Secondary | ICD-10-CM | POA: Diagnosis not present

## 2020-10-08 DIAGNOSIS — Z5321 Procedure and treatment not carried out due to patient leaving prior to being seen by health care provider: Secondary | ICD-10-CM | POA: Diagnosis not present

## 2020-10-10 ENCOUNTER — Other Ambulatory Visit (HOSPITAL_COMMUNITY): Payer: Medicare Other

## 2020-10-10 DIAGNOSIS — J9601 Acute respiratory failure with hypoxia: Secondary | ICD-10-CM | POA: Diagnosis not present

## 2020-10-10 DIAGNOSIS — N179 Acute kidney failure, unspecified: Secondary | ICD-10-CM | POA: Diagnosis present

## 2020-10-10 DIAGNOSIS — D72829 Elevated white blood cell count, unspecified: Secondary | ICD-10-CM | POA: Diagnosis not present

## 2020-10-10 DIAGNOSIS — A419 Sepsis, unspecified organism: Secondary | ICD-10-CM | POA: Diagnosis not present

## 2020-10-10 DIAGNOSIS — M069 Rheumatoid arthritis, unspecified: Secondary | ICD-10-CM | POA: Diagnosis present

## 2020-10-10 DIAGNOSIS — Z7901 Long term (current) use of anticoagulants: Secondary | ICD-10-CM | POA: Diagnosis not present

## 2020-10-10 DIAGNOSIS — D631 Anemia in chronic kidney disease: Secondary | ICD-10-CM | POA: Diagnosis present

## 2020-10-10 DIAGNOSIS — N3001 Acute cystitis with hematuria: Secondary | ICD-10-CM | POA: Diagnosis not present

## 2020-10-10 DIAGNOSIS — I4891 Unspecified atrial fibrillation: Secondary | ICD-10-CM | POA: Diagnosis not present

## 2020-10-10 DIAGNOSIS — R059 Cough, unspecified: Secondary | ICD-10-CM | POA: Diagnosis not present

## 2020-10-10 DIAGNOSIS — E7800 Pure hypercholesterolemia, unspecified: Secondary | ICD-10-CM | POA: Diagnosis present

## 2020-10-10 DIAGNOSIS — R52 Pain, unspecified: Secondary | ICD-10-CM | POA: Diagnosis not present

## 2020-10-10 DIAGNOSIS — R0902 Hypoxemia: Secondary | ICD-10-CM | POA: Diagnosis not present

## 2020-10-10 DIAGNOSIS — N185 Chronic kidney disease, stage 5: Secondary | ICD-10-CM | POA: Diagnosis present

## 2020-10-10 DIAGNOSIS — J440 Chronic obstructive pulmonary disease with acute lower respiratory infection: Secondary | ICD-10-CM | POA: Diagnosis present

## 2020-10-10 DIAGNOSIS — Z87891 Personal history of nicotine dependence: Secondary | ICD-10-CM | POA: Diagnosis not present

## 2020-10-10 DIAGNOSIS — R0602 Shortness of breath: Secondary | ICD-10-CM | POA: Diagnosis not present

## 2020-10-10 DIAGNOSIS — E875 Hyperkalemia: Secondary | ICD-10-CM | POA: Diagnosis present

## 2020-10-10 DIAGNOSIS — J189 Pneumonia, unspecified organism: Secondary | ICD-10-CM | POA: Diagnosis not present

## 2020-10-10 DIAGNOSIS — R000 Tachycardia, unspecified: Secondary | ICD-10-CM | POA: Diagnosis not present

## 2020-10-10 DIAGNOSIS — E1122 Type 2 diabetes mellitus with diabetic chronic kidney disease: Secondary | ICD-10-CM | POA: Diagnosis present

## 2020-10-10 DIAGNOSIS — B9689 Other specified bacterial agents as the cause of diseases classified elsewhere: Secondary | ICD-10-CM | POA: Diagnosis not present

## 2020-10-10 DIAGNOSIS — R531 Weakness: Secondary | ICD-10-CM | POA: Diagnosis not present

## 2020-10-10 DIAGNOSIS — B9620 Unspecified Escherichia coli [E. coli] as the cause of diseases classified elsewhere: Secondary | ICD-10-CM | POA: Diagnosis present

## 2020-10-10 DIAGNOSIS — I2510 Atherosclerotic heart disease of native coronary artery without angina pectoris: Secondary | ICD-10-CM | POA: Diagnosis present

## 2020-10-10 DIAGNOSIS — I5032 Chronic diastolic (congestive) heart failure: Secondary | ICD-10-CM | POA: Diagnosis not present

## 2020-10-10 DIAGNOSIS — Z79899 Other long term (current) drug therapy: Secondary | ICD-10-CM | POA: Diagnosis not present

## 2020-10-10 DIAGNOSIS — I132 Hypertensive heart and chronic kidney disease with heart failure and with stage 5 chronic kidney disease, or end stage renal disease: Secondary | ICD-10-CM | POA: Diagnosis present

## 2020-10-10 DIAGNOSIS — F418 Other specified anxiety disorders: Secondary | ICD-10-CM | POA: Diagnosis present

## 2020-10-10 DIAGNOSIS — G2581 Restless legs syndrome: Secondary | ICD-10-CM | POA: Diagnosis present

## 2020-10-10 DIAGNOSIS — I480 Paroxysmal atrial fibrillation: Secondary | ICD-10-CM | POA: Diagnosis present

## 2020-10-10 DIAGNOSIS — G253 Myoclonus: Secondary | ICD-10-CM | POA: Diagnosis present

## 2020-10-11 DIAGNOSIS — J9601 Acute respiratory failure with hypoxia: Secondary | ICD-10-CM | POA: Diagnosis not present

## 2020-10-11 DIAGNOSIS — A419 Sepsis, unspecified organism: Secondary | ICD-10-CM | POA: Diagnosis not present

## 2020-10-11 DIAGNOSIS — J189 Pneumonia, unspecified organism: Secondary | ICD-10-CM | POA: Diagnosis not present

## 2020-10-12 DIAGNOSIS — A419 Sepsis, unspecified organism: Secondary | ICD-10-CM | POA: Diagnosis not present

## 2020-10-12 DIAGNOSIS — J189 Pneumonia, unspecified organism: Secondary | ICD-10-CM | POA: Diagnosis not present

## 2020-10-12 DIAGNOSIS — J9601 Acute respiratory failure with hypoxia: Secondary | ICD-10-CM | POA: Diagnosis not present

## 2020-10-13 ENCOUNTER — Ambulatory Visit (HOSPITAL_BASED_OUTPATIENT_CLINIC_OR_DEPARTMENT_OTHER): Admission: RE | Admit: 2020-10-13 | Payer: Medicare Other | Source: Home / Self Care | Admitting: Orthopedic Surgery

## 2020-10-13 DIAGNOSIS — J9601 Acute respiratory failure with hypoxia: Secondary | ICD-10-CM | POA: Diagnosis not present

## 2020-10-13 DIAGNOSIS — A419 Sepsis, unspecified organism: Secondary | ICD-10-CM | POA: Diagnosis not present

## 2020-10-13 DIAGNOSIS — J189 Pneumonia, unspecified organism: Secondary | ICD-10-CM | POA: Diagnosis not present

## 2020-10-13 SURGERY — TENDON REPAIR
Anesthesia: Choice | Laterality: Right

## 2020-10-14 NOTE — Progress Notes (Signed)
PT STABLE AT TIME OF DISCHARGE 

## 2020-10-17 DIAGNOSIS — B9620 Unspecified Escherichia coli [E. coli] as the cause of diseases classified elsewhere: Secondary | ICD-10-CM | POA: Diagnosis not present

## 2020-10-17 DIAGNOSIS — I5030 Unspecified diastolic (congestive) heart failure: Secondary | ICD-10-CM | POA: Diagnosis not present

## 2020-10-17 DIAGNOSIS — K5790 Diverticulosis of intestine, part unspecified, without perforation or abscess without bleeding: Secondary | ICD-10-CM | POA: Diagnosis not present

## 2020-10-17 DIAGNOSIS — Z87891 Personal history of nicotine dependence: Secondary | ICD-10-CM | POA: Diagnosis not present

## 2020-10-17 DIAGNOSIS — Z87440 Personal history of urinary (tract) infections: Secondary | ICD-10-CM | POA: Diagnosis not present

## 2020-10-17 DIAGNOSIS — G2581 Restless legs syndrome: Secondary | ICD-10-CM | POA: Diagnosis not present

## 2020-10-17 DIAGNOSIS — Z9181 History of falling: Secondary | ICD-10-CM | POA: Diagnosis not present

## 2020-10-17 DIAGNOSIS — E1122 Type 2 diabetes mellitus with diabetic chronic kidney disease: Secondary | ICD-10-CM | POA: Diagnosis not present

## 2020-10-17 DIAGNOSIS — I130 Hypertensive heart and chronic kidney disease with heart failure and stage 1 through stage 4 chronic kidney disease, or unspecified chronic kidney disease: Secondary | ICD-10-CM | POA: Diagnosis not present

## 2020-10-17 DIAGNOSIS — J181 Lobar pneumonia, unspecified organism: Secondary | ICD-10-CM | POA: Diagnosis not present

## 2020-10-17 DIAGNOSIS — N3001 Acute cystitis with hematuria: Secondary | ICD-10-CM | POA: Diagnosis not present

## 2020-10-17 DIAGNOSIS — Z981 Arthrodesis status: Secondary | ICD-10-CM | POA: Diagnosis not present

## 2020-10-17 DIAGNOSIS — J440 Chronic obstructive pulmonary disease with acute lower respiratory infection: Secondary | ICD-10-CM | POA: Diagnosis not present

## 2020-10-17 DIAGNOSIS — F419 Anxiety disorder, unspecified: Secondary | ICD-10-CM | POA: Diagnosis not present

## 2020-10-17 DIAGNOSIS — K219 Gastro-esophageal reflux disease without esophagitis: Secondary | ICD-10-CM | POA: Diagnosis not present

## 2020-10-17 DIAGNOSIS — A419 Sepsis, unspecified organism: Secondary | ICD-10-CM | POA: Diagnosis not present

## 2020-10-17 DIAGNOSIS — Z7951 Long term (current) use of inhaled steroids: Secondary | ICD-10-CM | POA: Diagnosis not present

## 2020-10-17 DIAGNOSIS — L89312 Pressure ulcer of right buttock, stage 2: Secondary | ICD-10-CM | POA: Diagnosis not present

## 2020-10-17 DIAGNOSIS — E538 Deficiency of other specified B group vitamins: Secondary | ICD-10-CM | POA: Diagnosis not present

## 2020-10-17 DIAGNOSIS — I2510 Atherosclerotic heart disease of native coronary artery without angina pectoris: Secondary | ICD-10-CM | POA: Diagnosis not present

## 2020-10-17 DIAGNOSIS — Z7902 Long term (current) use of antithrombotics/antiplatelets: Secondary | ICD-10-CM | POA: Diagnosis not present

## 2020-10-17 DIAGNOSIS — M069 Rheumatoid arthritis, unspecified: Secondary | ICD-10-CM | POA: Diagnosis not present

## 2020-10-17 DIAGNOSIS — F32A Depression, unspecified: Secondary | ICD-10-CM | POA: Diagnosis not present

## 2020-10-17 DIAGNOSIS — N184 Chronic kidney disease, stage 4 (severe): Secondary | ICD-10-CM | POA: Diagnosis not present

## 2020-10-17 DIAGNOSIS — D631 Anemia in chronic kidney disease: Secondary | ICD-10-CM | POA: Diagnosis not present

## 2020-10-19 ENCOUNTER — Telehealth: Payer: Self-pay

## 2020-10-19 ENCOUNTER — Inpatient Hospital Stay: Payer: Medicare Other | Attending: Oncology

## 2020-10-20 DIAGNOSIS — Z7689 Persons encountering health services in other specified circumstances: Secondary | ICD-10-CM | POA: Diagnosis not present

## 2020-10-20 DIAGNOSIS — I129 Hypertensive chronic kidney disease with stage 1 through stage 4 chronic kidney disease, or unspecified chronic kidney disease: Secondary | ICD-10-CM | POA: Diagnosis not present

## 2020-10-20 DIAGNOSIS — E1122 Type 2 diabetes mellitus with diabetic chronic kidney disease: Secondary | ICD-10-CM | POA: Diagnosis not present

## 2020-10-20 DIAGNOSIS — Z8701 Personal history of pneumonia (recurrent): Secondary | ICD-10-CM | POA: Diagnosis not present

## 2020-10-20 DIAGNOSIS — N184 Chronic kidney disease, stage 4 (severe): Secondary | ICD-10-CM | POA: Diagnosis not present

## 2020-10-20 NOTE — Telephone Encounter (Signed)
NA

## 2020-10-21 DIAGNOSIS — N184 Chronic kidney disease, stage 4 (severe): Secondary | ICD-10-CM | POA: Diagnosis not present

## 2020-10-21 DIAGNOSIS — M069 Rheumatoid arthritis, unspecified: Secondary | ICD-10-CM | POA: Diagnosis not present

## 2020-10-21 DIAGNOSIS — A419 Sepsis, unspecified organism: Secondary | ICD-10-CM | POA: Diagnosis not present

## 2020-10-21 DIAGNOSIS — I130 Hypertensive heart and chronic kidney disease with heart failure and stage 1 through stage 4 chronic kidney disease, or unspecified chronic kidney disease: Secondary | ICD-10-CM | POA: Diagnosis not present

## 2020-10-21 DIAGNOSIS — E1122 Type 2 diabetes mellitus with diabetic chronic kidney disease: Secondary | ICD-10-CM | POA: Diagnosis not present

## 2020-10-21 DIAGNOSIS — I5030 Unspecified diastolic (congestive) heart failure: Secondary | ICD-10-CM | POA: Diagnosis not present

## 2020-10-26 DIAGNOSIS — E1122 Type 2 diabetes mellitus with diabetic chronic kidney disease: Secondary | ICD-10-CM | POA: Diagnosis not present

## 2020-10-26 DIAGNOSIS — M069 Rheumatoid arthritis, unspecified: Secondary | ICD-10-CM | POA: Diagnosis not present

## 2020-10-26 DIAGNOSIS — A419 Sepsis, unspecified organism: Secondary | ICD-10-CM | POA: Diagnosis not present

## 2020-10-26 DIAGNOSIS — I130 Hypertensive heart and chronic kidney disease with heart failure and stage 1 through stage 4 chronic kidney disease, or unspecified chronic kidney disease: Secondary | ICD-10-CM | POA: Diagnosis not present

## 2020-10-26 DIAGNOSIS — N184 Chronic kidney disease, stage 4 (severe): Secondary | ICD-10-CM | POA: Diagnosis not present

## 2020-10-26 DIAGNOSIS — I5030 Unspecified diastolic (congestive) heart failure: Secondary | ICD-10-CM | POA: Diagnosis not present

## 2020-10-27 DIAGNOSIS — N184 Chronic kidney disease, stage 4 (severe): Secondary | ICD-10-CM | POA: Diagnosis not present

## 2020-10-27 DIAGNOSIS — I5030 Unspecified diastolic (congestive) heart failure: Secondary | ICD-10-CM | POA: Diagnosis not present

## 2020-10-27 DIAGNOSIS — I130 Hypertensive heart and chronic kidney disease with heart failure and stage 1 through stage 4 chronic kidney disease, or unspecified chronic kidney disease: Secondary | ICD-10-CM | POA: Diagnosis not present

## 2020-10-27 DIAGNOSIS — E1122 Type 2 diabetes mellitus with diabetic chronic kidney disease: Secondary | ICD-10-CM | POA: Diagnosis not present

## 2020-10-27 DIAGNOSIS — A419 Sepsis, unspecified organism: Secondary | ICD-10-CM | POA: Diagnosis not present

## 2020-10-27 DIAGNOSIS — M069 Rheumatoid arthritis, unspecified: Secondary | ICD-10-CM | POA: Diagnosis not present

## 2020-10-31 DIAGNOSIS — I130 Hypertensive heart and chronic kidney disease with heart failure and stage 1 through stage 4 chronic kidney disease, or unspecified chronic kidney disease: Secondary | ICD-10-CM | POA: Diagnosis not present

## 2020-10-31 DIAGNOSIS — I5030 Unspecified diastolic (congestive) heart failure: Secondary | ICD-10-CM | POA: Diagnosis not present

## 2020-10-31 DIAGNOSIS — M069 Rheumatoid arthritis, unspecified: Secondary | ICD-10-CM | POA: Diagnosis not present

## 2020-10-31 DIAGNOSIS — A419 Sepsis, unspecified organism: Secondary | ICD-10-CM | POA: Diagnosis not present

## 2020-10-31 DIAGNOSIS — N184 Chronic kidney disease, stage 4 (severe): Secondary | ICD-10-CM | POA: Diagnosis not present

## 2020-10-31 DIAGNOSIS — E1122 Type 2 diabetes mellitus with diabetic chronic kidney disease: Secondary | ICD-10-CM | POA: Diagnosis not present

## 2020-11-03 DIAGNOSIS — M069 Rheumatoid arthritis, unspecified: Secondary | ICD-10-CM | POA: Diagnosis not present

## 2020-11-03 DIAGNOSIS — N184 Chronic kidney disease, stage 4 (severe): Secondary | ICD-10-CM | POA: Diagnosis not present

## 2020-11-03 DIAGNOSIS — I130 Hypertensive heart and chronic kidney disease with heart failure and stage 1 through stage 4 chronic kidney disease, or unspecified chronic kidney disease: Secondary | ICD-10-CM | POA: Diagnosis not present

## 2020-11-03 DIAGNOSIS — A419 Sepsis, unspecified organism: Secondary | ICD-10-CM | POA: Diagnosis not present

## 2020-11-03 DIAGNOSIS — E1122 Type 2 diabetes mellitus with diabetic chronic kidney disease: Secondary | ICD-10-CM | POA: Diagnosis not present

## 2020-11-03 DIAGNOSIS — I5030 Unspecified diastolic (congestive) heart failure: Secondary | ICD-10-CM | POA: Diagnosis not present

## 2020-11-09 DIAGNOSIS — E1122 Type 2 diabetes mellitus with diabetic chronic kidney disease: Secondary | ICD-10-CM | POA: Diagnosis not present

## 2020-11-09 DIAGNOSIS — A419 Sepsis, unspecified organism: Secondary | ICD-10-CM | POA: Diagnosis not present

## 2020-11-09 DIAGNOSIS — N184 Chronic kidney disease, stage 4 (severe): Secondary | ICD-10-CM | POA: Diagnosis not present

## 2020-11-09 DIAGNOSIS — M069 Rheumatoid arthritis, unspecified: Secondary | ICD-10-CM | POA: Diagnosis not present

## 2020-11-09 DIAGNOSIS — I130 Hypertensive heart and chronic kidney disease with heart failure and stage 1 through stage 4 chronic kidney disease, or unspecified chronic kidney disease: Secondary | ICD-10-CM | POA: Diagnosis not present

## 2020-11-09 DIAGNOSIS — I5030 Unspecified diastolic (congestive) heart failure: Secondary | ICD-10-CM | POA: Diagnosis not present

## 2020-11-10 ENCOUNTER — Other Ambulatory Visit: Payer: Self-pay | Admitting: Orthopedic Surgery

## 2020-11-14 DIAGNOSIS — E1122 Type 2 diabetes mellitus with diabetic chronic kidney disease: Secondary | ICD-10-CM | POA: Diagnosis not present

## 2020-11-14 DIAGNOSIS — I130 Hypertensive heart and chronic kidney disease with heart failure and stage 1 through stage 4 chronic kidney disease, or unspecified chronic kidney disease: Secondary | ICD-10-CM | POA: Diagnosis not present

## 2020-11-14 DIAGNOSIS — N184 Chronic kidney disease, stage 4 (severe): Secondary | ICD-10-CM | POA: Diagnosis not present

## 2020-11-14 DIAGNOSIS — A419 Sepsis, unspecified organism: Secondary | ICD-10-CM | POA: Diagnosis not present

## 2020-11-14 DIAGNOSIS — I5030 Unspecified diastolic (congestive) heart failure: Secondary | ICD-10-CM | POA: Diagnosis not present

## 2020-11-14 DIAGNOSIS — M069 Rheumatoid arthritis, unspecified: Secondary | ICD-10-CM | POA: Diagnosis not present

## 2020-11-16 DIAGNOSIS — Z87891 Personal history of nicotine dependence: Secondary | ICD-10-CM | POA: Diagnosis not present

## 2020-11-16 DIAGNOSIS — F32A Depression, unspecified: Secondary | ICD-10-CM | POA: Diagnosis not present

## 2020-11-16 DIAGNOSIS — Z7951 Long term (current) use of inhaled steroids: Secondary | ICD-10-CM | POA: Diagnosis not present

## 2020-11-16 DIAGNOSIS — N184 Chronic kidney disease, stage 4 (severe): Secondary | ICD-10-CM | POA: Diagnosis not present

## 2020-11-16 DIAGNOSIS — J440 Chronic obstructive pulmonary disease with acute lower respiratory infection: Secondary | ICD-10-CM | POA: Diagnosis not present

## 2020-11-16 DIAGNOSIS — Z981 Arthrodesis status: Secondary | ICD-10-CM | POA: Diagnosis not present

## 2020-11-16 DIAGNOSIS — N3001 Acute cystitis with hematuria: Secondary | ICD-10-CM | POA: Diagnosis not present

## 2020-11-16 DIAGNOSIS — I2510 Atherosclerotic heart disease of native coronary artery without angina pectoris: Secondary | ICD-10-CM | POA: Diagnosis not present

## 2020-11-16 DIAGNOSIS — G2581 Restless legs syndrome: Secondary | ICD-10-CM | POA: Diagnosis not present

## 2020-11-16 DIAGNOSIS — Z87440 Personal history of urinary (tract) infections: Secondary | ICD-10-CM | POA: Diagnosis not present

## 2020-11-16 DIAGNOSIS — L89312 Pressure ulcer of right buttock, stage 2: Secondary | ICD-10-CM | POA: Diagnosis not present

## 2020-11-16 DIAGNOSIS — E538 Deficiency of other specified B group vitamins: Secondary | ICD-10-CM | POA: Diagnosis not present

## 2020-11-16 DIAGNOSIS — F419 Anxiety disorder, unspecified: Secondary | ICD-10-CM | POA: Diagnosis not present

## 2020-11-16 DIAGNOSIS — A419 Sepsis, unspecified organism: Secondary | ICD-10-CM | POA: Diagnosis not present

## 2020-11-16 DIAGNOSIS — B9620 Unspecified Escherichia coli [E. coli] as the cause of diseases classified elsewhere: Secondary | ICD-10-CM | POA: Diagnosis not present

## 2020-11-16 DIAGNOSIS — J181 Lobar pneumonia, unspecified organism: Secondary | ICD-10-CM | POA: Diagnosis not present

## 2020-11-16 DIAGNOSIS — K219 Gastro-esophageal reflux disease without esophagitis: Secondary | ICD-10-CM | POA: Diagnosis not present

## 2020-11-16 DIAGNOSIS — Z9181 History of falling: Secondary | ICD-10-CM | POA: Diagnosis not present

## 2020-11-16 DIAGNOSIS — K5790 Diverticulosis of intestine, part unspecified, without perforation or abscess without bleeding: Secondary | ICD-10-CM | POA: Diagnosis not present

## 2020-11-16 DIAGNOSIS — Z7902 Long term (current) use of antithrombotics/antiplatelets: Secondary | ICD-10-CM | POA: Diagnosis not present

## 2020-11-16 DIAGNOSIS — I5030 Unspecified diastolic (congestive) heart failure: Secondary | ICD-10-CM | POA: Diagnosis not present

## 2020-11-16 DIAGNOSIS — M069 Rheumatoid arthritis, unspecified: Secondary | ICD-10-CM | POA: Diagnosis not present

## 2020-11-16 DIAGNOSIS — I130 Hypertensive heart and chronic kidney disease with heart failure and stage 1 through stage 4 chronic kidney disease, or unspecified chronic kidney disease: Secondary | ICD-10-CM | POA: Diagnosis not present

## 2020-11-16 DIAGNOSIS — D631 Anemia in chronic kidney disease: Secondary | ICD-10-CM | POA: Diagnosis not present

## 2020-11-16 DIAGNOSIS — E1122 Type 2 diabetes mellitus with diabetic chronic kidney disease: Secondary | ICD-10-CM | POA: Diagnosis not present

## 2020-11-18 DIAGNOSIS — E1169 Type 2 diabetes mellitus with other specified complication: Secondary | ICD-10-CM | POA: Diagnosis not present

## 2020-11-18 DIAGNOSIS — N184 Chronic kidney disease, stage 4 (severe): Secondary | ICD-10-CM | POA: Diagnosis not present

## 2020-11-23 DIAGNOSIS — A419 Sepsis, unspecified organism: Secondary | ICD-10-CM | POA: Diagnosis not present

## 2020-11-23 DIAGNOSIS — N184 Chronic kidney disease, stage 4 (severe): Secondary | ICD-10-CM | POA: Diagnosis not present

## 2020-11-23 DIAGNOSIS — E1122 Type 2 diabetes mellitus with diabetic chronic kidney disease: Secondary | ICD-10-CM | POA: Diagnosis not present

## 2020-11-23 DIAGNOSIS — M069 Rheumatoid arthritis, unspecified: Secondary | ICD-10-CM | POA: Diagnosis not present

## 2020-11-23 DIAGNOSIS — I5030 Unspecified diastolic (congestive) heart failure: Secondary | ICD-10-CM | POA: Diagnosis not present

## 2020-11-23 DIAGNOSIS — I130 Hypertensive heart and chronic kidney disease with heart failure and stage 1 through stage 4 chronic kidney disease, or unspecified chronic kidney disease: Secondary | ICD-10-CM | POA: Diagnosis not present

## 2020-12-01 DIAGNOSIS — A419 Sepsis, unspecified organism: Secondary | ICD-10-CM | POA: Diagnosis not present

## 2020-12-01 DIAGNOSIS — I130 Hypertensive heart and chronic kidney disease with heart failure and stage 1 through stage 4 chronic kidney disease, or unspecified chronic kidney disease: Secondary | ICD-10-CM | POA: Diagnosis not present

## 2020-12-01 DIAGNOSIS — I5030 Unspecified diastolic (congestive) heart failure: Secondary | ICD-10-CM | POA: Diagnosis not present

## 2020-12-01 DIAGNOSIS — E1122 Type 2 diabetes mellitus with diabetic chronic kidney disease: Secondary | ICD-10-CM | POA: Diagnosis not present

## 2020-12-01 DIAGNOSIS — N184 Chronic kidney disease, stage 4 (severe): Secondary | ICD-10-CM | POA: Diagnosis not present

## 2020-12-01 DIAGNOSIS — M069 Rheumatoid arthritis, unspecified: Secondary | ICD-10-CM | POA: Diagnosis not present

## 2020-12-02 ENCOUNTER — Ambulatory Visit: Payer: Medicare Other

## 2020-12-02 ENCOUNTER — Other Ambulatory Visit: Payer: Self-pay | Admitting: Oncology

## 2020-12-02 ENCOUNTER — Inpatient Hospital Stay: Payer: Medicare Other

## 2020-12-02 ENCOUNTER — Other Ambulatory Visit: Payer: Self-pay

## 2020-12-02 ENCOUNTER — Other Ambulatory Visit: Payer: Self-pay | Admitting: Hematology and Oncology

## 2020-12-02 ENCOUNTER — Inpatient Hospital Stay: Payer: Medicare Other | Attending: Oncology | Admitting: Oncology

## 2020-12-02 DIAGNOSIS — D631 Anemia in chronic kidney disease: Secondary | ICD-10-CM | POA: Insufficient documentation

## 2020-12-02 DIAGNOSIS — E538 Deficiency of other specified B group vitamins: Secondary | ICD-10-CM | POA: Diagnosis not present

## 2020-12-02 DIAGNOSIS — D500 Iron deficiency anemia secondary to blood loss (chronic): Secondary | ICD-10-CM

## 2020-12-02 DIAGNOSIS — I130 Hypertensive heart and chronic kidney disease with heart failure and stage 1 through stage 4 chronic kidney disease, or unspecified chronic kidney disease: Secondary | ICD-10-CM | POA: Diagnosis not present

## 2020-12-02 DIAGNOSIS — N183 Chronic kidney disease, stage 3 unspecified: Secondary | ICD-10-CM

## 2020-12-02 DIAGNOSIS — A419 Sepsis, unspecified organism: Secondary | ICD-10-CM | POA: Diagnosis not present

## 2020-12-02 DIAGNOSIS — I5032 Chronic diastolic (congestive) heart failure: Secondary | ICD-10-CM | POA: Diagnosis not present

## 2020-12-02 DIAGNOSIS — E1122 Type 2 diabetes mellitus with diabetic chronic kidney disease: Secondary | ICD-10-CM | POA: Diagnosis not present

## 2020-12-02 DIAGNOSIS — R6889 Other general symptoms and signs: Secondary | ICD-10-CM

## 2020-12-02 DIAGNOSIS — N189 Chronic kidney disease, unspecified: Secondary | ICD-10-CM | POA: Diagnosis not present

## 2020-12-02 DIAGNOSIS — I5030 Unspecified diastolic (congestive) heart failure: Secondary | ICD-10-CM | POA: Diagnosis not present

## 2020-12-02 DIAGNOSIS — M069 Rheumatoid arthritis, unspecified: Secondary | ICD-10-CM | POA: Diagnosis not present

## 2020-12-02 DIAGNOSIS — N184 Chronic kidney disease, stage 4 (severe): Secondary | ICD-10-CM | POA: Diagnosis not present

## 2020-12-02 DIAGNOSIS — D649 Anemia, unspecified: Secondary | ICD-10-CM | POA: Diagnosis not present

## 2020-12-02 DIAGNOSIS — R0602 Shortness of breath: Secondary | ICD-10-CM | POA: Insufficient documentation

## 2020-12-02 LAB — COMPREHENSIVE METABOLIC PANEL
Albumin: 4.2 (ref 3.5–5.0)
Calcium: 9.6 (ref 8.7–10.7)

## 2020-12-02 LAB — IRON,TIBC AND FERRITIN PANEL
%SAT: 14.1
Ferritin: 334
Iron: 33
TIBC: 233

## 2020-12-02 LAB — HEPATIC FUNCTION PANEL
ALT: 17 (ref 7–35)
AST: 37 — AB (ref 13–35)
Alkaline Phosphatase: 76 (ref 25–125)
Bilirubin, Total: 0.5

## 2020-12-02 LAB — BASIC METABOLIC PANEL
BUN: 49 — AB (ref 4–21)
CO2: 28 — AB (ref 13–22)
Chloride: 100 (ref 99–108)
Creatinine: 3.1 — AB (ref 0.5–1.1)
Glucose: 106
Potassium: 4.5 (ref 3.4–5.3)
Sodium: 136 — AB (ref 137–147)

## 2020-12-02 LAB — CBC
MCV: 87 (ref 81–99)
RBC: 3.45 — AB (ref 3.87–5.11)

## 2020-12-02 LAB — CBC AND DIFFERENTIAL
HCT: 30 — AB (ref 36–46)
Hemoglobin: 9.6 — AB (ref 12.0–16.0)
Neutrophils Absolute: 6.71
Platelets: 247 (ref 150–399)
WBC: 11

## 2020-12-02 NOTE — Progress Notes (Signed)
[facility]  [address] [place],  [place]  [address] [phone]  Clinic Day:  12/02/2020  Referring physician: Marco Collie, MD   This document serves as a record of services personally performed by Hosie Poisson, MD. It was created on their behalf by Curry,Lauren E, a trained medical scribe. The creation of this record is based on the scribe's personal observations and the provider's statements to them.   CHIEF COMPLAINT:  CC: Anemia of chronic disease  Current Treatment:  Procrit intermittently   HISTORY OF PRESENT ILLNESS:  Gloria Best is a 80 y.o. female with anemia of chronic disease and chronic kidney disease, who also has B12 deficiency and intermittent iron deficiency.  She previously received Procrit, but has not had this since 2008.  She has been on and off B12 injections monthly.  She has occasionally required transfusion, as well as intravenous iron.  Evaluation revealed iron deficiency, which is felt to be due to chronic gastrointestinal blood loss.  She has had Barrett's esophagus and underwent fundoplication previously.  She has a previous history of right hemicolectomy for benign disease.  EGD and colonoscopy in February 2015 revealed ulceration at the anastomosis.  She had been on Pradaxa due to atrial fibrillation.  She also has rheumatoid arthritis, with associated chronic leukocytosis.  She is also on Remicade and Plaquenil for the rheumatoid arthritis.  In September 2017, she had recurrent iron deficiency with a decrease in her hemoglobin to 8.6.  She received IV Feraheme again at that time.  In October, her hemoglobin was back up to 10.3.  In March 2018, her hemoglobin had dropped down to 8.6 after a right knee replacement in February.  She later developed a urinary tract infection, as well as atrial fibrillation and had recurrent anemia.  An EGD during that admission, apparently did not reveal any sites of bleeding even  though she had described hematemesis.  In May 2018, she required transfusion of 2 units of packed red blood cells after her hemoglobin dropped down to 7.6.Marland Kitchen  She also received IV Feraheme.  She saw Dr. Lyda Jester again and he repeated an EGD in May which did not reveal any abnormality or source of significant GI blood loss.  Her hemoglobin was 8.9 the day of the EGD.    She received 2 units of packed red blood cells again in June 2018, when her hemoglobin dropped to 8 and she was symptomatic.  Evaluation of her anemia revealed recurrent B12 deficiency with a low normal B12 level, but a significantly elevated MMA.  Iron studies were normal.  She was started back on monthly B12 injections in mid June 2018.  We referred her to a nephrologist, as her creatinine was 2.6 in June and 2.2 in July.  Unfortunately, she was hospitalized again in July with worsening anemia, GERD, and CHF exacerbation.  She received Feraheme 100mg  IV while in the hospital.  Dr. Lyda Jester saw her and performed a colonoscopy, which did show an ulceration of the small bowel.  Pathology reveals inflamed intestinal mucosa with minimal villous morphology and focal acute ulceration.  She was placed on pantoprazole 40 mg twice daily.  Her hemoglobin was 9.1 at the time of discharge from the hospital, but dropped down to 6.7 in August and she received 2 units of packed red blood cells again.  Pradaxa was discontinued due to the gastrointestinal blood loss.  At her visit later in August, her hemoglobin was 10.3.  She saw Dr.  Patel at [facility] in September 2018 for chronic kidney disease.  Her hemoglobin continued to slowly improve and had been fairly stable, running above 11, since February 2019.  There was a suspicious lesion of the left upper anterior chest wall, so she was referred to dermatologist in June 2019 and underwent removal of a skin cancer from the left anterior chest wall.  She continues to follow up with Dr. Nyra Capes regularly for  her heart failure.  She continues to follow with Dr. Posey Pronto regarding her kidney failure. She was seen for routine follow-up in June 2020, her hemoglobin was 12.8.  She was referred back in July when her hemoglobin was 9.4 at Dr. Meta Hatchet.  Stool Hemoccult were obtained and 2/3 were positive, so she was referred back to Dr. Lyda Jester.  EGD and colonoscopy in August did not reveal any significant source of blood loss, there was slightly friable gastric mucosa.  Biopsy of the small bowel was negative.   INTERVAL HISTORY:  T[place]ler has been added onto the schedule after Dr. Nyra Capes called stating that her hemoglobin was 8.1.  Repeat evaluation here revealed a hemoglobin of 9.6 with an MCV of 87, down from 10.5 when we saw her last.  Iron level is 33.0 with a TIBC of 233 for a percent saturation of 14.1%.  Dr. Nyra Capes had sent her over today for Procrit, but instead I recommend IV iron.  Chemistries are remarkable for a creatinine of 3.1, previously 2.7, and a BUN of 49.  She is symptomatic with shortness of breath and fatigue.  Her  appetite is good, and she is eating well.  She declined weighing due to her symptoms.  She denies fever, chills or other signs of infection.  She denies nausea, vomiting, bowel issues, or abdominal pain.  She denies sore throat, cough, dyspnea, or chest pain.  REVIEW OF SYSTEMS:  Review of Systems  Constitutional: Positive for fatigue.  HENT:  Negative.   Eyes: Negative.   Respiratory: Positive for shortness of breath.   Cardiovascular: Negative.   Gastrointestinal: Negative.   Endocrine: Negative.   Genitourinary: Negative.    Musculoskeletal: Negative.   Skin: Negative.   Neurological: Negative.   Hematological: Negative.   Psychiatric/Behavioral: Negative.      VITALS:  There were no vitals taken for this visit.  Wt Readings from Last 3 Encounters:  06/01/20 165 lb 9 oz (75.1 kg)  09/23/20 172 lb 8 oz (78.2 kg)  09/21/20 171 lb 4.8 oz (77.7 kg)    There is no  height or weight on file to calculate BMI.  Performance status (ECOG): 1 - Symptomatic but completely ambulatory  PHYSICAL EXAM:  Physical Exam Constitutional:      General: She is not in acute distress.    Appearance: Normal appearance. She is normal weight.  HENT:     Head: Normocephalic and atraumatic.  Eyes:     General: No scleral icterus.    Extraocular Movements: Extraocular movements intact.     Conjunctiva/sclera: Conjunctivae normal.     Pupils: Pupils are equal, round, and reactive to light.  Cardiovascular:     Rate and Rhythm: Normal rate and regular rhythm.     Pulses: Normal pulses.     Heart sounds: Normal heart sounds. No murmur heard. No friction rub. No gallop.   Pulmonary:     Effort: Pulmonary effort is normal. No respiratory distress.     Breath sounds: Normal breath sounds.     Comments: Bibasilar crackles left  greater than right Abdominal:     General: Bowel sounds are normal. There is no distension.     Palpations: Abdomen is soft. There is no mass.     Tenderness: There is no abdominal tenderness.  Musculoskeletal:        General: Normal range of motion.     Cervical back: Normal range of motion and neck supple.     Right lower leg: No edema.     Left lower leg: No edema.  Lymphadenopathy:     Cervical: No cervical adenopathy.  Skin:    General: Skin is warm and dry.  Neurological:     General: No focal deficit present.     Mental Status: She is alert and oriented to person, place, and time. Mental status is at baseline.  Psychiatric:        Mood and Affect: Mood normal.        Behavior: Behavior normal.        Thought Content: Thought content normal.        Judgment: Judgment normal.     LABS:   CBC Latest Ref Rng & Units 09/21/2020 11/25/2017 12/27/2016  WBC - 8.8 23.0(H) 11.1(H)  Hemoglobin 12.0 - 16.0 10.5(A) 10.7(L) 9.1(L)  Hematocrit 36 - 46 32(A) 33.3(L) 28.4(L)  Platelets 150 - 399 220 252 486(H)   CMP Latest Ref Rng & Units  09/21/2020 08/15/2020 07/25/2020  Glucose 70 - 99 mg/dL - 124(H) 106(H)  BUN 4 - 21 41(A) 39(H) 47(H)  Creatinine 0.5 - 1.1 2.7(A) 2.57(H) 2.83(H)  Sodium 137 - 147 140 139 138  Potassium 3.4 - 5.3 4.6 5.3(H) 5.0  Chloride 99 - 108 104 106 103  CO2 13 - 22 26(A) 23 26  Calcium 8.7 - 10.7 8.3(A) 8.4(L) 8.6(L)  Total Protein 6.5 - 8.1 g/dL - - -  Total Bilirubin 0.3 - 1.2 mg/dL - - -  Alkaline Phos 25 - 125 69 - -  AST 13 - 35 26 - -  ALT 7 - 35 12 - -     No results found for: TIBC, FERRITIN, IRONPCTSAT Lab Results  Component Value Date   LDH 202 03/07/2011     STUDIES:  No results found.   Allergies:  Allergies  Allergen Reactions  . Adhesive [Tape] Other (See Comments)    Tears skin  . Amlodipine Besylate Other (See Comments) and Itching  . Latex Other (See Comments)    Blisters  . Lotrel [Amlodipine Besy-Benazepril Hcl] Itching and Cough    Current Medications: Current Outpatient Medications  Medication Sig Dispense Refill  . albuterol (PROVENTIL) (2.5 MG/3ML) 0.083% nebulizer solution Take 2.5 mg by nebulization every 6 (six) hours as needed for wheezing or shortness of breath.    Marland Kitchen apixaban (ELIQUIS) 2.5 MG TABS tablet Take by mouth 2 (two) times daily.    . ARIPiprazole (ABILIFY) 5 MG tablet Take 5 mg by mouth daily.     Marland Kitchen atorvastatin (LIPITOR) 80 MG tablet Take 80 mg by mouth daily at 6 PM.     . carvedilol (COREG) 6.25 MG tablet Take 6.25 mg by mouth 2 (two) times daily.     Marland Kitchen diltiazem (TIAZAC) 180 MG 24 hr capsule Take by mouth.    . diphenhydrAMINE (BENADRYL) 25 mg capsule Take 25 mg by mouth every 30 (thirty) days.    Marland Kitchen escitalopram (LEXAPRO) 10 MG tablet Take 10 mg by mouth daily.    Marland Kitchen esomeprazole (NEXIUM) 40 MG capsule Take 40 mg by  mouth daily at 12 noon.    . ferrous sulfate 325 (65 FE) MG tablet Take 1 tablet (325 mg total) by mouth 2 (two) times daily with a meal. (Patient taking differently: Take 325 mg by mouth daily with breakfast. ) 60 tablet 3   . flecainide (TAMBOCOR) 50 MG tablet Take by mouth.    . folic acid (FOLVITE) 1 MG tablet Take 1 mg by mouth daily.     . furosemide (LASIX) 20 MG tablet Take 20 mg by mouth in the morning, at noon, and at bedtime.     . hydrALAZINE (APRESOLINE) 10 MG tablet Take 10 mg by mouth 3 (three) times daily.    . hydroxychloroquine (PLAQUENIL) 200 MG tablet Take 200 mg by mouth 2 (two) times daily.     Marland Kitchen inFLIXimab (REMICADE) 100 MG injection Inject 100 mg into the vein every 30 (thirty) days.     Marland Kitchen ipratropium (ATROVENT HFA) 17 MCG/ACT inhaler Inhale into the lungs.    . isosorbide mononitrate (IMDUR) 60 MG 24 hr tablet Take 60 mg by mouth daily.    . methenamine (HIPREX) 1 g tablet Take 1 g by mouth 2 (two) times daily.    . metolazone (ZAROXOLYN) 2.5 MG tablet Take 2.5 mg by mouth daily.    Marland Kitchen MYRBETRIQ 50 MG TB24 tablet Take 50 mg by mouth daily.     . Omega-3 Fatty Acids (FISH OIL) 1000 MG CAPS Take 1,000 mg by mouth daily.    . phenazopyridine (PYRIDIUM) 100 MG tablet Take 1 tablet by mouth every 8 (eight) hours.    . polyethylene glycol (MIRALAX / GLYCOLAX) packet Take 17 g by mouth daily. (Patient taking differently: Take 17 g by mouth daily as needed for moderate constipation. ) 14 each 0  . Potassium Gluconate 2.5 MEQ TABS Take by mouth.    . pramipexole (MIRAPEX) 0.5 MG tablet Take 0.5 mg by mouth at bedtime.    . pregabalin (LYRICA) 100 MG capsule Take 100 mg by mouth 3 (three) times daily.    . tamsulosin (FLOMAX) 0.4 MG CAPS capsule Take by mouth.    . Vitamin D, Ergocalciferol, (DRISDOL) 50000 units CAPS capsule Take 50,000 Units by mouth every 30 (thirty) days.     No current facility-administered medications for this visit.     ASSESSMENT & PLAN:   Assessment:   1.  Chronic anemia, which is felt to be multifactorial, due to chronic kidney disease and chronic GI blood loss.  Iron studies in the past have been consistent with anemia of chronic disease, but periodically she does  develop iron deficiency which does require IV iron.  Her hemoglobin continues to fluctuate up and down.   Plan: As her hemoglobin is below 10, she is symptomatic and iron studies are again consistent with deficiency, we will arrange for 2 doses of IV iron 1 week apart.  We will administer her 1st dose on Monday and arrange for her monthly B12 injection with her 2nd dose.  She knows to continue oral iron daily.  We will plan to see her back in 4 weeks with a CBC and comprehensive metabolic panel.  The patient understands the plans discussed today and is in agreement with them.  She knows to contact our office if she develops symptoms of worsening anemia.   I provided 15 minutes of face-to-face time during this this encounter and > 50% was spent counseling as documented under my assessment and plan.    Derwood Kaplan,  MD [facility] [address] [place] [address] Dept: [phone] Dept Fax: [phone]   I, Rita Ohara, am acting as scribe for Derwood Kaplan, MD  I have reviewed this report as typed by the medical scribe, and it is complete and accurate.

## 2020-12-05 ENCOUNTER — Inpatient Hospital Stay: Payer: Medicare Other

## 2020-12-05 ENCOUNTER — Other Ambulatory Visit: Payer: Self-pay

## 2020-12-05 VITALS — BP 145/69 | HR 112 | Temp 98.5°F | Resp 18 | Ht 59.0 in | Wt 173.2 lb

## 2020-12-05 DIAGNOSIS — R0602 Shortness of breath: Secondary | ICD-10-CM | POA: Diagnosis not present

## 2020-12-05 DIAGNOSIS — E538 Deficiency of other specified B group vitamins: Secondary | ICD-10-CM | POA: Diagnosis not present

## 2020-12-05 DIAGNOSIS — N189 Chronic kidney disease, unspecified: Secondary | ICD-10-CM | POA: Diagnosis not present

## 2020-12-05 DIAGNOSIS — D631 Anemia in chronic kidney disease: Secondary | ICD-10-CM | POA: Diagnosis not present

## 2020-12-05 DIAGNOSIS — M069 Rheumatoid arthritis, unspecified: Secondary | ICD-10-CM | POA: Diagnosis not present

## 2020-12-05 DIAGNOSIS — I5032 Chronic diastolic (congestive) heart failure: Secondary | ICD-10-CM | POA: Diagnosis not present

## 2020-12-05 MED ORDER — CYANOCOBALAMIN 1000 MCG/ML IJ SOLN
1000.0000 ug | Freq: Once | INTRAMUSCULAR | Status: AC
  Administered 2020-12-05: 1000 ug via INTRAMUSCULAR

## 2020-12-05 MED ORDER — CYANOCOBALAMIN 1000 MCG/ML IJ SOLN
INTRAMUSCULAR | Status: AC
  Filled 2020-12-05: qty 1

## 2020-12-05 MED ORDER — SODIUM CHLORIDE 0.9 % IV SOLN
510.0000 mg | Freq: Once | INTRAVENOUS | Status: AC
  Administered 2020-12-05: 510 mg via INTRAVENOUS
  Filled 2020-12-05: qty 17

## 2020-12-05 MED ORDER — HEPARIN SOD (PORK) LOCK FLUSH 100 UNIT/ML IV SOLN
500.0000 [IU] | Freq: Once | INTRAVENOUS | Status: DC | PRN
  Filled 2020-12-05: qty 5

## 2020-12-05 MED ORDER — FUROSEMIDE 10 MG/ML IJ SOLN
INTRAMUSCULAR | Status: AC
  Filled 2020-12-05: qty 4

## 2020-12-05 MED ORDER — FUROSEMIDE 10 MG/ML IJ SOLN
40.0000 mg | Freq: Once | INTRAMUSCULAR | Status: AC
  Administered 2020-12-05: 40 mg via INTRAVENOUS

## 2020-12-05 MED ORDER — SODIUM CHLORIDE 0.9 % IV SOLN
Freq: Once | INTRAVENOUS | Status: AC
  Filled 2020-12-05: qty 250

## 2020-12-05 NOTE — Addendum Note (Signed)
Addended by: Juanetta Beets on: 12/05/2020 12:04 PM   Modules accepted: Orders

## 2020-12-05 NOTE — Patient Instructions (Signed)
Ferumoxytol injection What is this medicine? FERUMOXYTOL is an iron complex. Iron is used to make healthy red blood cells, which carry oxygen and nutrients throughout the body. This medicine is used to treat iron deficiency anemia. This medicine may be used for other purposes; ask your health care provider or pharmacist if you have questions. COMMON BRAND NAME(S): Feraheme What should I tell my health care provider before I take this medicine? They need to know if you have any of these conditions:  anemia not caused by low iron levels  high levels of iron in the blood  magnetic resonance imaging (MRI) test scheduled  an unusual or allergic reaction to iron, other medicines, foods, dyes, or preservatives  pregnant or trying to get pregnant  breast-feeding How should I use this medicine? This medicine is for injection into a vein. It is given by a health care professional in a hospital or clinic setting. Talk to your pediatrician regarding the use of this medicine in children. Special care may be needed. Overdosage: If you think you have taken too much of this medicine contact a poison control center or emergency room at once. NOTE: This medicine is only for you. Do not share this medicine with others. What if I miss a dose? It is important not to miss your dose. Call your doctor or health care professional if you are unable to keep an appointment. What may interact with this medicine? This medicine may interact with the following medications:  other iron products This list may not describe all possible interactions. Give your health care provider a list of all the medicines, herbs, non-prescription drugs, or dietary supplements you use. Also tell them if you smoke, drink alcohol, or use illegal drugs. Some items may interact with your medicine. What should I watch for while using this medicine? Visit your doctor or healthcare professional regularly. Tell your doctor or healthcare  professional if your symptoms do not start to get better or if they get worse. You may need blood work done while you are taking this medicine. You may need to follow a special diet. Talk to your doctor. Foods that contain iron include: whole grains/cereals, dried fruits, beans, or peas, leafy green vegetables, and organ meats (liver, kidney). What side effects may I notice from receiving this medicine? Side effects that you should report to your doctor or health care professional as soon as possible:  allergic reactions like skin rash, itching or hives, swelling of the face, lips, or tongue  breathing problems  changes in blood pressure  feeling faint or lightheaded, falls  fever or chills  flushing, sweating, or hot feelings  swelling of the ankles or feet Side effects that usually do not require medical attention (report to your doctor or health care professional if they continue or are bothersome):  diarrhea  headache  nausea, vomiting  stomach pain This list may not describe all possible side effects. Call your doctor for medical advice about side effects. You may report side effects to FDA at 1-800-FDA-1088. Where should I keep my medicine? This drug is given in a hospital or clinic and will not be stored at home. NOTE: This sheet is a summary. It may not cover all possible information. If you have questions about this medicine, talk to your doctor, pharmacist, or health care provider.  2021 Elsevier/Gold Standard (2016-12-10 20:21:10)  

## 2020-12-06 DIAGNOSIS — D631 Anemia in chronic kidney disease: Secondary | ICD-10-CM | POA: Diagnosis not present

## 2020-12-06 DIAGNOSIS — E1122 Type 2 diabetes mellitus with diabetic chronic kidney disease: Secondary | ICD-10-CM | POA: Diagnosis not present

## 2020-12-06 DIAGNOSIS — I5032 Chronic diastolic (congestive) heart failure: Secondary | ICD-10-CM | POA: Diagnosis not present

## 2020-12-06 DIAGNOSIS — N184 Chronic kidney disease, stage 4 (severe): Secondary | ICD-10-CM | POA: Diagnosis not present

## 2020-12-07 DIAGNOSIS — A419 Sepsis, unspecified organism: Secondary | ICD-10-CM | POA: Diagnosis not present

## 2020-12-07 DIAGNOSIS — I130 Hypertensive heart and chronic kidney disease with heart failure and stage 1 through stage 4 chronic kidney disease, or unspecified chronic kidney disease: Secondary | ICD-10-CM | POA: Diagnosis not present

## 2020-12-07 DIAGNOSIS — E1122 Type 2 diabetes mellitus with diabetic chronic kidney disease: Secondary | ICD-10-CM | POA: Diagnosis not present

## 2020-12-07 DIAGNOSIS — I5030 Unspecified diastolic (congestive) heart failure: Secondary | ICD-10-CM | POA: Diagnosis not present

## 2020-12-07 DIAGNOSIS — M069 Rheumatoid arthritis, unspecified: Secondary | ICD-10-CM | POA: Diagnosis not present

## 2020-12-07 DIAGNOSIS — N184 Chronic kidney disease, stage 4 (severe): Secondary | ICD-10-CM | POA: Diagnosis not present

## 2020-12-07 DIAGNOSIS — M66241 Spontaneous rupture of extensor tendons, right hand: Secondary | ICD-10-CM | POA: Diagnosis not present

## 2020-12-08 ENCOUNTER — Other Ambulatory Visit: Payer: Self-pay | Admitting: Pharmacist

## 2020-12-08 DIAGNOSIS — A419 Sepsis, unspecified organism: Secondary | ICD-10-CM | POA: Diagnosis not present

## 2020-12-08 DIAGNOSIS — E1122 Type 2 diabetes mellitus with diabetic chronic kidney disease: Secondary | ICD-10-CM | POA: Diagnosis not present

## 2020-12-08 DIAGNOSIS — N184 Chronic kidney disease, stage 4 (severe): Secondary | ICD-10-CM | POA: Diagnosis not present

## 2020-12-08 DIAGNOSIS — I5030 Unspecified diastolic (congestive) heart failure: Secondary | ICD-10-CM | POA: Diagnosis not present

## 2020-12-08 DIAGNOSIS — I130 Hypertensive heart and chronic kidney disease with heart failure and stage 1 through stage 4 chronic kidney disease, or unspecified chronic kidney disease: Secondary | ICD-10-CM | POA: Diagnosis not present

## 2020-12-08 DIAGNOSIS — M069 Rheumatoid arthritis, unspecified: Secondary | ICD-10-CM | POA: Diagnosis not present

## 2020-12-09 DIAGNOSIS — D631 Anemia in chronic kidney disease: Secondary | ICD-10-CM | POA: Diagnosis not present

## 2020-12-09 DIAGNOSIS — N189 Chronic kidney disease, unspecified: Secondary | ICD-10-CM | POA: Diagnosis not present

## 2020-12-09 DIAGNOSIS — Z7189 Other specified counseling: Secondary | ICD-10-CM | POA: Diagnosis not present

## 2020-12-09 DIAGNOSIS — R0602 Shortness of breath: Secondary | ICD-10-CM | POA: Diagnosis not present

## 2020-12-09 DIAGNOSIS — N185 Chronic kidney disease, stage 5: Secondary | ICD-10-CM | POA: Diagnosis not present

## 2020-12-09 DIAGNOSIS — Z20822 Contact with and (suspected) exposure to covid-19: Secondary | ICD-10-CM | POA: Diagnosis not present

## 2020-12-09 DIAGNOSIS — J189 Pneumonia, unspecified organism: Secondary | ICD-10-CM | POA: Diagnosis not present

## 2020-12-09 DIAGNOSIS — J90 Pleural effusion, not elsewhere classified: Secondary | ICD-10-CM | POA: Diagnosis not present

## 2020-12-09 DIAGNOSIS — I5032 Chronic diastolic (congestive) heart failure: Secondary | ICD-10-CM | POA: Diagnosis not present

## 2020-12-09 DIAGNOSIS — R058 Other specified cough: Secondary | ICD-10-CM | POA: Diagnosis not present

## 2020-12-12 ENCOUNTER — Other Ambulatory Visit (HOSPITAL_COMMUNITY): Payer: Medicare Other

## 2020-12-12 ENCOUNTER — Inpatient Hospital Stay: Payer: Medicare Other

## 2020-12-12 ENCOUNTER — Inpatient Hospital Stay: Payer: Medicare Other | Attending: Oncology

## 2020-12-12 DIAGNOSIS — I4891 Unspecified atrial fibrillation: Secondary | ICD-10-CM | POA: Diagnosis not present

## 2020-12-12 DIAGNOSIS — Z9049 Acquired absence of other specified parts of digestive tract: Secondary | ICD-10-CM | POA: Insufficient documentation

## 2020-12-12 DIAGNOSIS — Z87891 Personal history of nicotine dependence: Secondary | ICD-10-CM | POA: Diagnosis not present

## 2020-12-12 DIAGNOSIS — I129 Hypertensive chronic kidney disease with stage 1 through stage 4 chronic kidney disease, or unspecified chronic kidney disease: Secondary | ICD-10-CM | POA: Diagnosis present

## 2020-12-12 DIAGNOSIS — E871 Hypo-osmolality and hyponatremia: Secondary | ICD-10-CM | POA: Diagnosis not present

## 2020-12-12 DIAGNOSIS — Z6832 Body mass index (BMI) 32.0-32.9, adult: Secondary | ICD-10-CM | POA: Diagnosis not present

## 2020-12-12 DIAGNOSIS — K828 Other specified diseases of gallbladder: Secondary | ICD-10-CM | POA: Diagnosis not present

## 2020-12-12 DIAGNOSIS — I352 Nonrheumatic aortic (valve) stenosis with insufficiency: Secondary | ICD-10-CM | POA: Diagnosis present

## 2020-12-12 DIAGNOSIS — I509 Heart failure, unspecified: Secondary | ICD-10-CM | POA: Insufficient documentation

## 2020-12-12 DIAGNOSIS — Z7901 Long term (current) use of anticoagulants: Secondary | ICD-10-CM | POA: Diagnosis not present

## 2020-12-12 DIAGNOSIS — I517 Cardiomegaly: Secondary | ICD-10-CM | POA: Diagnosis not present

## 2020-12-12 DIAGNOSIS — E538 Deficiency of other specified B group vitamins: Secondary | ICD-10-CM | POA: Insufficient documentation

## 2020-12-12 DIAGNOSIS — N189 Chronic kidney disease, unspecified: Secondary | ICD-10-CM | POA: Insufficient documentation

## 2020-12-12 DIAGNOSIS — Z79899 Other long term (current) drug therapy: Secondary | ICD-10-CM | POA: Insufficient documentation

## 2020-12-12 DIAGNOSIS — R0602 Shortness of breath: Secondary | ICD-10-CM | POA: Diagnosis not present

## 2020-12-12 DIAGNOSIS — E1122 Type 2 diabetes mellitus with diabetic chronic kidney disease: Secondary | ICD-10-CM | POA: Diagnosis present

## 2020-12-12 DIAGNOSIS — L89322 Pressure ulcer of left buttock, stage 2: Secondary | ICD-10-CM | POA: Diagnosis present

## 2020-12-12 DIAGNOSIS — M069 Rheumatoid arthritis, unspecified: Secondary | ICD-10-CM | POA: Diagnosis present

## 2020-12-12 DIAGNOSIS — Z96651 Presence of right artificial knee joint: Secondary | ICD-10-CM | POA: Insufficient documentation

## 2020-12-12 DIAGNOSIS — E785 Hyperlipidemia, unspecified: Secondary | ICD-10-CM | POA: Diagnosis present

## 2020-12-12 DIAGNOSIS — T462X5A Adverse effect of other antidysrhythmic drugs, initial encounter: Secondary | ICD-10-CM | POA: Diagnosis present

## 2020-12-12 DIAGNOSIS — D631 Anemia in chronic kidney disease: Secondary | ICD-10-CM | POA: Insufficient documentation

## 2020-12-12 DIAGNOSIS — R1084 Generalized abdominal pain: Secondary | ICD-10-CM | POA: Diagnosis not present

## 2020-12-12 DIAGNOSIS — R001 Bradycardia, unspecified: Secondary | ICD-10-CM | POA: Diagnosis present

## 2020-12-12 DIAGNOSIS — K449 Diaphragmatic hernia without obstruction or gangrene: Secondary | ICD-10-CM | POA: Diagnosis not present

## 2020-12-12 DIAGNOSIS — E875 Hyperkalemia: Secondary | ICD-10-CM | POA: Diagnosis present

## 2020-12-12 DIAGNOSIS — R9431 Abnormal electrocardiogram [ECG] [EKG]: Secondary | ICD-10-CM | POA: Diagnosis present

## 2020-12-12 DIAGNOSIS — N179 Acute kidney failure, unspecified: Secondary | ICD-10-CM | POA: Diagnosis not present

## 2020-12-12 DIAGNOSIS — I4510 Unspecified right bundle-branch block: Secondary | ICD-10-CM | POA: Diagnosis not present

## 2020-12-12 DIAGNOSIS — N184 Chronic kidney disease, stage 4 (severe): Secondary | ICD-10-CM | POA: Diagnosis present

## 2020-12-12 DIAGNOSIS — I493 Ventricular premature depolarization: Secondary | ICD-10-CM | POA: Diagnosis not present

## 2020-12-12 DIAGNOSIS — K219 Gastro-esophageal reflux disease without esophagitis: Secondary | ICD-10-CM | POA: Insufficient documentation

## 2020-12-12 DIAGNOSIS — I700 Atherosclerosis of aorta: Secondary | ICD-10-CM | POA: Diagnosis not present

## 2020-12-12 DIAGNOSIS — T378X5A Adverse effect of other specified systemic anti-infectives and antiparasitics, initial encounter: Secondary | ICD-10-CM | POA: Diagnosis present

## 2020-12-12 DIAGNOSIS — E669 Obesity, unspecified: Secondary | ICD-10-CM | POA: Diagnosis present

## 2020-12-12 MED FILL — Ferumoxytol Inj 510 MG/17ML (30 MG/ML) (Elemental Fe): INTRAVENOUS | Qty: 17 | Status: AC

## 2020-12-13 ENCOUNTER — Ambulatory Visit: Payer: Medicare Other

## 2020-12-13 ENCOUNTER — Other Ambulatory Visit: Payer: Self-pay | Admitting: Pharmacist

## 2020-12-13 DIAGNOSIS — E871 Hypo-osmolality and hyponatremia: Secondary | ICD-10-CM | POA: Diagnosis not present

## 2020-12-13 DIAGNOSIS — I493 Ventricular premature depolarization: Secondary | ICD-10-CM | POA: Diagnosis not present

## 2020-12-13 DIAGNOSIS — I4891 Unspecified atrial fibrillation: Secondary | ICD-10-CM | POA: Diagnosis not present

## 2020-12-13 DIAGNOSIS — R001 Bradycardia, unspecified: Secondary | ICD-10-CM | POA: Diagnosis not present

## 2020-12-13 DIAGNOSIS — M069 Rheumatoid arthritis, unspecified: Secondary | ICD-10-CM | POA: Diagnosis not present

## 2020-12-13 DIAGNOSIS — Z79899 Other long term (current) drug therapy: Secondary | ICD-10-CM | POA: Diagnosis not present

## 2020-12-13 DIAGNOSIS — I4510 Unspecified right bundle-branch block: Secondary | ICD-10-CM | POA: Diagnosis not present

## 2020-12-14 DIAGNOSIS — Z79899 Other long term (current) drug therapy: Secondary | ICD-10-CM | POA: Diagnosis not present

## 2020-12-14 DIAGNOSIS — M069 Rheumatoid arthritis, unspecified: Secondary | ICD-10-CM | POA: Diagnosis not present

## 2020-12-14 DIAGNOSIS — I4891 Unspecified atrial fibrillation: Secondary | ICD-10-CM | POA: Diagnosis not present

## 2020-12-14 DIAGNOSIS — L89322 Pressure ulcer of left buttock, stage 2: Secondary | ICD-10-CM | POA: Diagnosis not present

## 2020-12-14 DIAGNOSIS — R001 Bradycardia, unspecified: Secondary | ICD-10-CM | POA: Diagnosis not present

## 2020-12-15 ENCOUNTER — Encounter (HOSPITAL_BASED_OUTPATIENT_CLINIC_OR_DEPARTMENT_OTHER): Admission: RE | Payer: Self-pay | Source: Home / Self Care

## 2020-12-15 ENCOUNTER — Ambulatory Visit (HOSPITAL_BASED_OUTPATIENT_CLINIC_OR_DEPARTMENT_OTHER): Admission: RE | Admit: 2020-12-15 | Payer: Medicare Other | Source: Home / Self Care | Admitting: Orthopedic Surgery

## 2020-12-15 DIAGNOSIS — L89322 Pressure ulcer of left buttock, stage 2: Secondary | ICD-10-CM | POA: Diagnosis not present

## 2020-12-15 DIAGNOSIS — M069 Rheumatoid arthritis, unspecified: Secondary | ICD-10-CM | POA: Diagnosis not present

## 2020-12-15 DIAGNOSIS — R001 Bradycardia, unspecified: Secondary | ICD-10-CM | POA: Diagnosis not present

## 2020-12-15 DIAGNOSIS — Z79899 Other long term (current) drug therapy: Secondary | ICD-10-CM | POA: Diagnosis not present

## 2020-12-15 DIAGNOSIS — I4891 Unspecified atrial fibrillation: Secondary | ICD-10-CM | POA: Diagnosis not present

## 2020-12-15 SURGERY — TENDON REPAIR
Anesthesia: Choice | Laterality: Right

## 2020-12-18 DIAGNOSIS — K219 Gastro-esophageal reflux disease without esophagitis: Secondary | ICD-10-CM | POA: Diagnosis not present

## 2020-12-18 DIAGNOSIS — R9431 Abnormal electrocardiogram [ECG] [EKG]: Secondary | ICD-10-CM | POA: Diagnosis not present

## 2020-12-18 DIAGNOSIS — K5790 Diverticulosis of intestine, part unspecified, without perforation or abscess without bleeding: Secondary | ICD-10-CM | POA: Diagnosis not present

## 2020-12-18 DIAGNOSIS — Z7409 Other reduced mobility: Secondary | ICD-10-CM | POA: Diagnosis not present

## 2020-12-18 DIAGNOSIS — Z7902 Long term (current) use of antithrombotics/antiplatelets: Secondary | ICD-10-CM | POA: Diagnosis not present

## 2020-12-18 DIAGNOSIS — E1142 Type 2 diabetes mellitus with diabetic polyneuropathy: Secondary | ICD-10-CM | POA: Diagnosis not present

## 2020-12-18 DIAGNOSIS — M069 Rheumatoid arthritis, unspecified: Secondary | ICD-10-CM | POA: Diagnosis not present

## 2020-12-18 DIAGNOSIS — Z872 Personal history of diseases of the skin and subcutaneous tissue: Secondary | ICD-10-CM | POA: Diagnosis not present

## 2020-12-18 DIAGNOSIS — E1122 Type 2 diabetes mellitus with diabetic chronic kidney disease: Secondary | ICD-10-CM | POA: Diagnosis not present

## 2020-12-18 DIAGNOSIS — M2010 Hallux valgus (acquired), unspecified foot: Secondary | ICD-10-CM | POA: Diagnosis not present

## 2020-12-18 DIAGNOSIS — I4891 Unspecified atrial fibrillation: Secondary | ICD-10-CM | POA: Diagnosis not present

## 2020-12-18 DIAGNOSIS — Z7982 Long term (current) use of aspirin: Secondary | ICD-10-CM | POA: Diagnosis not present

## 2020-12-18 DIAGNOSIS — R001 Bradycardia, unspecified: Secondary | ICD-10-CM | POA: Diagnosis not present

## 2020-12-18 DIAGNOSIS — D631 Anemia in chronic kidney disease: Secondary | ICD-10-CM | POA: Diagnosis not present

## 2020-12-18 DIAGNOSIS — E875 Hyperkalemia: Secondary | ICD-10-CM | POA: Diagnosis not present

## 2020-12-18 DIAGNOSIS — Z7901 Long term (current) use of anticoagulants: Secondary | ICD-10-CM | POA: Diagnosis not present

## 2020-12-18 DIAGNOSIS — I5030 Unspecified diastolic (congestive) heart failure: Secondary | ICD-10-CM | POA: Diagnosis not present

## 2020-12-18 DIAGNOSIS — N184 Chronic kidney disease, stage 4 (severe): Secondary | ICD-10-CM | POA: Diagnosis not present

## 2020-12-18 DIAGNOSIS — I2510 Atherosclerotic heart disease of native coronary artery without angina pectoris: Secondary | ICD-10-CM | POA: Diagnosis not present

## 2020-12-18 DIAGNOSIS — I351 Nonrheumatic aortic (valve) insufficiency: Secondary | ICD-10-CM | POA: Diagnosis not present

## 2020-12-18 DIAGNOSIS — Z7951 Long term (current) use of inhaled steroids: Secondary | ICD-10-CM | POA: Diagnosis not present

## 2020-12-18 DIAGNOSIS — I130 Hypertensive heart and chronic kidney disease with heart failure and stage 1 through stage 4 chronic kidney disease, or unspecified chronic kidney disease: Secondary | ICD-10-CM | POA: Diagnosis not present

## 2020-12-18 DIAGNOSIS — Z8719 Personal history of other diseases of the digestive system: Secondary | ICD-10-CM | POA: Diagnosis not present

## 2020-12-18 DIAGNOSIS — I491 Atrial premature depolarization: Secondary | ICD-10-CM | POA: Diagnosis not present

## 2020-12-18 DIAGNOSIS — M66241 Spontaneous rupture of extensor tendons, right hand: Secondary | ICD-10-CM | POA: Diagnosis not present

## 2020-12-20 DIAGNOSIS — I4510 Unspecified right bundle-branch block: Secondary | ICD-10-CM | POA: Diagnosis not present

## 2020-12-20 DIAGNOSIS — R9431 Abnormal electrocardiogram [ECG] [EKG]: Secondary | ICD-10-CM | POA: Diagnosis not present

## 2020-12-20 DIAGNOSIS — I129 Hypertensive chronic kidney disease with stage 1 through stage 4 chronic kidney disease, or unspecified chronic kidney disease: Secondary | ICD-10-CM | POA: Diagnosis not present

## 2020-12-20 DIAGNOSIS — I459 Conduction disorder, unspecified: Secondary | ICD-10-CM | POA: Diagnosis not present

## 2020-12-20 DIAGNOSIS — Z8719 Personal history of other diseases of the digestive system: Secondary | ICD-10-CM | POA: Diagnosis not present

## 2020-12-20 DIAGNOSIS — N184 Chronic kidney disease, stage 4 (severe): Secondary | ICD-10-CM | POA: Diagnosis not present

## 2020-12-20 DIAGNOSIS — Z7689 Persons encountering health services in other specified circumstances: Secondary | ICD-10-CM | POA: Diagnosis not present

## 2020-12-20 DIAGNOSIS — K7460 Unspecified cirrhosis of liver: Secondary | ICD-10-CM | POA: Diagnosis not present

## 2020-12-20 DIAGNOSIS — E871 Hypo-osmolality and hyponatremia: Secondary | ICD-10-CM | POA: Diagnosis not present

## 2020-12-20 DIAGNOSIS — Z79899 Other long term (current) drug therapy: Secondary | ICD-10-CM | POA: Diagnosis not present

## 2020-12-20 DIAGNOSIS — I350 Nonrheumatic aortic (valve) stenosis: Secondary | ICD-10-CM | POA: Diagnosis not present

## 2020-12-20 DIAGNOSIS — E1142 Type 2 diabetes mellitus with diabetic polyneuropathy: Secondary | ICD-10-CM | POA: Diagnosis not present

## 2020-12-20 DIAGNOSIS — I484 Atypical atrial flutter: Secondary | ICD-10-CM | POA: Diagnosis not present

## 2020-12-20 DIAGNOSIS — I10 Essential (primary) hypertension: Secondary | ICD-10-CM | POA: Diagnosis not present

## 2020-12-20 DIAGNOSIS — I480 Paroxysmal atrial fibrillation: Secondary | ICD-10-CM | POA: Diagnosis not present

## 2020-12-20 DIAGNOSIS — E782 Mixed hyperlipidemia: Secondary | ICD-10-CM | POA: Diagnosis not present

## 2020-12-20 DIAGNOSIS — I351 Nonrheumatic aortic (valve) insufficiency: Secondary | ICD-10-CM | POA: Diagnosis not present

## 2020-12-20 DIAGNOSIS — E1122 Type 2 diabetes mellitus with diabetic chronic kidney disease: Secondary | ICD-10-CM | POA: Diagnosis not present

## 2020-12-20 DIAGNOSIS — E119 Type 2 diabetes mellitus without complications: Secondary | ICD-10-CM | POA: Diagnosis not present

## 2020-12-21 DIAGNOSIS — E1142 Type 2 diabetes mellitus with diabetic polyneuropathy: Secondary | ICD-10-CM | POA: Diagnosis not present

## 2020-12-21 DIAGNOSIS — M069 Rheumatoid arthritis, unspecified: Secondary | ICD-10-CM | POA: Diagnosis not present

## 2020-12-21 DIAGNOSIS — I4891 Unspecified atrial fibrillation: Secondary | ICD-10-CM | POA: Diagnosis not present

## 2020-12-21 DIAGNOSIS — R001 Bradycardia, unspecified: Secondary | ICD-10-CM | POA: Diagnosis not present

## 2020-12-21 DIAGNOSIS — I130 Hypertensive heart and chronic kidney disease with heart failure and stage 1 through stage 4 chronic kidney disease, or unspecified chronic kidney disease: Secondary | ICD-10-CM | POA: Diagnosis not present

## 2020-12-21 DIAGNOSIS — E1122 Type 2 diabetes mellitus with diabetic chronic kidney disease: Secondary | ICD-10-CM | POA: Diagnosis not present

## 2020-12-23 DIAGNOSIS — I4891 Unspecified atrial fibrillation: Secondary | ICD-10-CM | POA: Diagnosis not present

## 2020-12-23 DIAGNOSIS — R001 Bradycardia, unspecified: Secondary | ICD-10-CM | POA: Diagnosis not present

## 2020-12-23 DIAGNOSIS — E1122 Type 2 diabetes mellitus with diabetic chronic kidney disease: Secondary | ICD-10-CM | POA: Diagnosis not present

## 2020-12-23 DIAGNOSIS — I130 Hypertensive heart and chronic kidney disease with heart failure and stage 1 through stage 4 chronic kidney disease, or unspecified chronic kidney disease: Secondary | ICD-10-CM | POA: Diagnosis not present

## 2020-12-23 DIAGNOSIS — E1142 Type 2 diabetes mellitus with diabetic polyneuropathy: Secondary | ICD-10-CM | POA: Diagnosis not present

## 2020-12-23 DIAGNOSIS — M069 Rheumatoid arthritis, unspecified: Secondary | ICD-10-CM | POA: Diagnosis not present

## 2020-12-26 DIAGNOSIS — I4891 Unspecified atrial fibrillation: Secondary | ICD-10-CM | POA: Diagnosis not present

## 2020-12-26 DIAGNOSIS — I493 Ventricular premature depolarization: Secondary | ICD-10-CM | POA: Diagnosis not present

## 2020-12-26 DIAGNOSIS — I4510 Unspecified right bundle-branch block: Secondary | ICD-10-CM | POA: Diagnosis not present

## 2020-12-27 ENCOUNTER — Telehealth: Payer: Self-pay | Admitting: Oncology

## 2020-12-27 ENCOUNTER — Other Ambulatory Visit: Payer: Self-pay | Admitting: Pharmacist

## 2020-12-27 ENCOUNTER — Inpatient Hospital Stay (INDEPENDENT_AMBULATORY_CARE_PROVIDER_SITE_OTHER): Payer: Medicare Other | Admitting: Hematology and Oncology

## 2020-12-27 ENCOUNTER — Encounter: Payer: Self-pay | Admitting: Hematology and Oncology

## 2020-12-27 ENCOUNTER — Other Ambulatory Visit: Payer: Self-pay | Admitting: Hematology and Oncology

## 2020-12-27 ENCOUNTER — Inpatient Hospital Stay: Payer: Medicare Other

## 2020-12-27 ENCOUNTER — Other Ambulatory Visit: Payer: Self-pay

## 2020-12-27 VITALS — BP 125/60 | HR 67 | Temp 97.8°F | Resp 20 | Ht 59.0 in | Wt 171.1 lb

## 2020-12-27 DIAGNOSIS — D499 Neoplasm of unspecified behavior of unspecified site: Secondary | ICD-10-CM | POA: Diagnosis not present

## 2020-12-27 DIAGNOSIS — D500 Iron deficiency anemia secondary to blood loss (chronic): Secondary | ICD-10-CM

## 2020-12-27 DIAGNOSIS — I4891 Unspecified atrial fibrillation: Secondary | ICD-10-CM | POA: Diagnosis not present

## 2020-12-27 DIAGNOSIS — M069 Rheumatoid arthritis, unspecified: Secondary | ICD-10-CM | POA: Diagnosis not present

## 2020-12-27 DIAGNOSIS — K7460 Unspecified cirrhosis of liver: Secondary | ICD-10-CM | POA: Diagnosis not present

## 2020-12-27 DIAGNOSIS — I5032 Chronic diastolic (congestive) heart failure: Secondary | ICD-10-CM | POA: Diagnosis not present

## 2020-12-27 DIAGNOSIS — I130 Hypertensive heart and chronic kidney disease with heart failure and stage 1 through stage 4 chronic kidney disease, or unspecified chronic kidney disease: Secondary | ICD-10-CM | POA: Diagnosis not present

## 2020-12-27 DIAGNOSIS — E871 Hypo-osmolality and hyponatremia: Secondary | ICD-10-CM | POA: Diagnosis not present

## 2020-12-27 DIAGNOSIS — N189 Chronic kidney disease, unspecified: Secondary | ICD-10-CM | POA: Diagnosis not present

## 2020-12-27 DIAGNOSIS — E1142 Type 2 diabetes mellitus with diabetic polyneuropathy: Secondary | ICD-10-CM | POA: Diagnosis not present

## 2020-12-27 DIAGNOSIS — D649 Anemia, unspecified: Secondary | ICD-10-CM | POA: Diagnosis not present

## 2020-12-27 DIAGNOSIS — D631 Anemia in chronic kidney disease: Secondary | ICD-10-CM | POA: Diagnosis not present

## 2020-12-27 DIAGNOSIS — E1122 Type 2 diabetes mellitus with diabetic chronic kidney disease: Secondary | ICD-10-CM | POA: Diagnosis not present

## 2020-12-27 DIAGNOSIS — R001 Bradycardia, unspecified: Secondary | ICD-10-CM | POA: Diagnosis not present

## 2020-12-27 LAB — IRON,TIBC AND FERRITIN PANEL
%SAT: 14.6
Ferritin: 534
Iron: 36
TIBC: 245

## 2020-12-27 LAB — CBC AND DIFFERENTIAL
HCT: 30 — AB (ref 36–46)
Hemoglobin: 9.7 — AB (ref 12.0–16.0)
Neutrophils Absolute: 6.43
Platelets: 166 (ref 150–399)
WBC: 9.6

## 2020-12-27 LAB — CBC: RBC: 3.37 — AB (ref 3.87–5.11)

## 2020-12-27 NOTE — Progress Notes (Unsigned)
[facility]  [address] [place],  [place]  [address] [phone]  Clinic Day:  12/27/2020  Referring physician: Marco Collie, MD    CHIEF COMPLAINT:  CC: Anemia of chronic disease  Current Treatment:  Procrit intermittently   HISTORY OF PRESENT ILLNESS:  Gloria Best is a 80 y.o. female with anemia of chronic disease and chronic kidney disease, who also has B12 deficiency and intermittent iron deficiency.  She previously received Procrit, but has not had this since 2008.  She has been on and off B12 injections monthly.  She has occasionally required transfusion, as well as intravenous iron.  Evaluation revealed iron deficiency, which is felt to be due to chronic gastrointestinal blood loss.  She has had Barrett's esophagus and underwent fundoplication previously.  She has a previous history of right hemicolectomy for benign disease.  EGD and colonoscopy in February 2015 revealed ulceration at the anastomosis.  She had been on Pradaxa due to atrial fibrillation.  She also has rheumatoid arthritis, with associated chronic leukocytosis.  She is also on Remicade and Plaquenil for the rheumatoid arthritis.  In September 2017, she had recurrent iron deficiency with a decrease in her hemoglobin to 8.6.  She received IV Feraheme again at that time.  In October, her hemoglobin was back up to 10.3.  In March 2018, her hemoglobin had dropped down to 8.6 after a right knee replacement in February.  She later developed a urinary tract infection, as well as atrial fibrillation and had recurrent anemia.  An EGD during that admission, apparently did not reveal any sites of bleeding even though she had described hematemesis.  In May 2018, she required transfusion of 2 units of packed red blood cells after her hemoglobin dropped down to 7.6.Marland Kitchen  She also received IV Feraheme.  She saw Dr. Lyda Jester again and he repeated an EGD in May which did not reveal any abnormality  or source of significant GI blood loss.  Her hemoglobin was 8.9 the day of the EGD.    She received 2 units of packed red blood cells again in June 2018, when her hemoglobin dropped to 8 and she was symptomatic.  Evaluation of her anemia revealed recurrent B12 deficiency with a low normal B12 level, but a significantly elevated MMA.  Iron studies were normal.  She was started back on monthly B12 injections in mid June 2018.  We referred her to a nephrologist, as her creatinine was 2.6 in June and 2.2 in July.  Unfortunately, she was hospitalized again in July with worsening anemia, GERD, and CHF exacerbation.  She received Feraheme 100mg  IV while in the hospital.  Dr. Lyda Jester saw her and performed a colonoscopy, which did show an ulceration of the small bowel.  Pathology reveals inflamed intestinal mucosa with minimal villous morphology and focal acute ulceration.  She was placed on pantoprazole 40 mg twice daily.  Her hemoglobin was 9.1 at the time of discharge from the hospital, but dropped down to 6.7 in August and she received 2 units of packed red blood cells again.  Pradaxa was discontinued due to the gastrointestinal blood loss.  At her visit later in August, her hemoglobin was 10.3.  She saw Dr. Posey Pronto at [facility] in September 2018 for chronic kidney disease.  Her hemoglobin continued to slowly improve and had been fairly stable, running above 11, since February 2019.  There was a suspicious lesion of the left upper anterior chest wall, so she was referred  to dermatologist in June 2019 and underwent removal of a skin cancer from the left anterior chest wall.  She continues to follow up with Dr. Nyra Capes regularly for her heart failure.  She continues to follow with Dr. Posey Pronto regarding her kidney failure. She was seen for routine follow-up in June 2020, her hemoglobin was 12.8.  She was referred back in July when her hemoglobin was 9.4 at Dr. Meta Hatchet.  Stool Hemoccult were obtained and 2/3 were  positive, so she was referred back to Dr. Lyda Jester.  EGD and colonoscopy in August did not reveal any significant source of blood loss, there was slightly friable gastric mucosa.  Biopsy of the small bowel was negative.   INTERVAL HISTORY:  Gloria Best has been added onto the schedule after Dr. Nyra Capes called stating that her hemoglobin was 8.1.  Repeat evaluation here revealed a hemoglobin of 9.6 with an MCV of 87, down from 10.5 when we saw her last.  Iron level is 33.0 with a TIBC of 233 for a percent saturation of 14.1%.  Dr. Nyra Capes had sent her over today for Procrit, but instead I recommend IV iron.  Chemistries are remarkable for a creatinine of 3.1, previously 2.7, and a BUN of 49.  She is symptomatic with shortness of breath and fatigue.  Her  appetite is good, and she is eating well.  She declined weighing due to her symptoms.  She denies fever, chills or other signs of infection.  She denies nausea, vomiting, bowel issues, or abdominal pain.  She denies sore throat, cough, dyspnea, or chest pain.  REVIEW OF SYSTEMS:  Review of Systems - Oncology   VITALS:  There were no vitals taken for this visit.  Wt Readings from Last 3 Encounters:  12/05/20 173 lb 4 oz (78.6 kg)  06/01/20 165 lb 9 oz (75.1 kg)  09/23/20 172 lb 8 oz (78.2 kg)    There is no height or weight on file to calculate BMI.  Performance status (ECOG): 1 - Symptomatic but completely ambulatory  PHYSICAL EXAM:  Physical Exam  LABS:   CBC Latest Ref Rng & Units 12/02/2020 09/21/2020 11/25/2017  WBC - 11.0 8.8 23.0(H)  Hemoglobin 12.0 - 16.0 9.6(A) 10.5(A) 10.7(L)  Hematocrit 36 - 46 30(A) 32(A) 33.3(L)  Platelets 150 - 399 247 220 252   CMP Latest Ref Rng & Units 12/02/2020 09/21/2020 08/15/2020  Glucose 70 - 99 mg/dL - - 124(H)  BUN 4 - 21 49(A) 41(A) 39(H)  Creatinine 0.5 - 1.1 3.1(A) 2.7(A) 2.57(H)  Sodium 137 - 147 136(A) 140 139  Potassium 3.4 - 5.3 4.5 4.6 5.3(H)  Chloride 99 - 108 100 104 106  CO2 13 - 22  28(A) 26(A) 23  Calcium 8.7 - 10.7 9.6 8.3(A) 8.4(L)  Total Protein 6.5 - 8.1 g/dL - - -  Total Bilirubin 0.3 - 1.2 mg/dL - - -  Alkaline Phos 25 - 125 76 69 -  AST 13 - 35 37(A) 26 -  ALT 7 - 35 17 12 -     Lab Results  Component Value Date   TIBC 233 12/02/2020   FERRITIN 334 12/02/2020   IRONPCTSAT 14.1 12/02/2020   Lab Results  Component Value Date   LDH 202 03/07/2011     STUDIES:  No results found.   Allergies:  Allergies  Allergen Reactions  . Adhesive [Tape] Other (See Comments)    Tears skin  . Amlodipine Besylate Other (See Comments) and Itching  . Latex Other (See Comments)  Blisters  . Lotrel [Amlodipine Besy-Benazepril Hcl] Itching and Cough    Current Medications: Current Outpatient Medications  Medication Sig Dispense Refill  . albuterol (PROVENTIL) (2.5 MG/3ML) 0.083% nebulizer solution Take 2.5 mg by nebulization every 6 (six) hours as needed for wheezing or shortness of breath.    Marland Kitchen apixaban (ELIQUIS) 2.5 MG TABS tablet Take by mouth 2 (two) times daily.    . ARIPiprazole (ABILIFY) 5 MG tablet Take 5 mg by mouth daily.     Marland Kitchen atorvastatin (LIPITOR) 80 MG tablet Take 80 mg by mouth daily at 6 PM.     . carvedilol (COREG) 6.25 MG tablet Take 6.25 mg by mouth 2 (two) times daily.     Marland Kitchen diltiazem (TIAZAC) 180 MG 24 hr capsule Take by mouth.    . diphenhydrAMINE (BENADRYL) 25 mg capsule Take 25 mg by mouth every 30 (thirty) days.    Marland Kitchen escitalopram (LEXAPRO) 10 MG tablet Take 10 mg by mouth daily.    Marland Kitchen esomeprazole (NEXIUM) 40 MG capsule Take 40 mg by mouth daily at 12 noon.    . ferrous sulfate 325 (65 FE) MG tablet Take 1 tablet (325 mg total) by mouth 2 (two) times daily with a meal. (Patient taking differently: Take 325 mg by mouth daily with breakfast. ) 60 tablet 3  . flecainide (TAMBOCOR) 50 MG tablet Take by mouth.    . folic acid (FOLVITE) 1 MG tablet Take 1 mg by mouth daily.     . furosemide (LASIX) 20 MG tablet Take 20 mg by mouth in the  morning, at noon, and at bedtime.     . hydrALAZINE (APRESOLINE) 10 MG tablet Take 10 mg by mouth 3 (three) times daily.    . hydroxychloroquine (PLAQUENIL) 200 MG tablet Take 200 mg by mouth 2 (two) times daily.     Marland Kitchen inFLIXimab (REMICADE) 100 MG injection Inject 100 mg into the vein every 30 (thirty) days.     Marland Kitchen ipratropium (ATROVENT HFA) 17 MCG/ACT inhaler Inhale into the lungs.    . isosorbide mononitrate (IMDUR) 60 MG 24 hr tablet Take 60 mg by mouth daily.    . methenamine (HIPREX) 1 g tablet Take 1 g by mouth 2 (two) times daily.    . metolazone (ZAROXOLYN) 2.5 MG tablet Take 2.5 mg by mouth daily.    Marland Kitchen MYRBETRIQ 50 MG TB24 tablet Take 50 mg by mouth daily.     . Omega-3 Fatty Acids (FISH OIL) 1000 MG CAPS Take 1,000 mg by mouth daily.    . phenazopyridine (PYRIDIUM) 100 MG tablet Take 1 tablet by mouth every 8 (eight) hours.    . polyethylene glycol (MIRALAX / GLYCOLAX) packet Take 17 g by mouth daily. (Patient taking differently: Take 17 g by mouth daily as needed for moderate constipation. ) 14 each 0  . Potassium Gluconate 2.5 MEQ TABS Take by mouth.    . pramipexole (MIRAPEX) 0.5 MG tablet Take 0.5 mg by mouth at bedtime.    . pregabalin (LYRICA) 100 MG capsule Take 100 mg by mouth 3 (three) times daily.    . tamsulosin (FLOMAX) 0.4 MG CAPS capsule Take by mouth.    . Vitamin D, Ergocalciferol, (DRISDOL) 50000 units CAPS capsule Take 50,000 Units by mouth every 30 (thirty) days.     No current facility-administered medications for this visit.     ASSESSMENT & PLAN:   Assessment:   1.  Chronic anemia, which is felt to be multifactorial, due to chronic kidney  disease and chronic GI blood loss.  Iron studies in the past have been consistent with anemia of chronic disease, but periodically she does develop iron deficiency which does require IV iron.  Her hemoglobin continues to fluctuate up and down.   Plan: As her hemoglobin is below 10, she is symptomatic and iron studies are  again consistent with deficiency, we will arrange for 2 doses of IV iron 1 week apart.  We will administer her 1st dose on Monday and arrange for her monthly B12 injection with her 2nd dose.  She knows to continue oral iron daily.  We will plan to see her back in 4 weeks with a CBC and comprehensive metabolic panel.  The patient understands the plans discussed today and is in agreement with them.  She knows to contact our office if she develops symptoms of worsening anemia.   Dayton Scrape, [facility] [address] [place] [address] Dept: [phone] Dept Fax: [phone]

## 2020-12-27 NOTE — Addendum Note (Signed)
Addended by: Juanetta Beets on: 12/27/2020 03:11 PM   Modules accepted: Orders

## 2020-12-27 NOTE — Telephone Encounter (Signed)
12/27/20 Spoke with daughter and sched appt

## 2020-12-29 ENCOUNTER — Other Ambulatory Visit: Payer: Self-pay | Admitting: Hematology and Oncology

## 2020-12-29 DIAGNOSIS — I4891 Unspecified atrial fibrillation: Secondary | ICD-10-CM | POA: Diagnosis not present

## 2020-12-29 DIAGNOSIS — R001 Bradycardia, unspecified: Secondary | ICD-10-CM | POA: Diagnosis not present

## 2020-12-29 DIAGNOSIS — E1122 Type 2 diabetes mellitus with diabetic chronic kidney disease: Secondary | ICD-10-CM | POA: Diagnosis not present

## 2020-12-29 DIAGNOSIS — I130 Hypertensive heart and chronic kidney disease with heart failure and stage 1 through stage 4 chronic kidney disease, or unspecified chronic kidney disease: Secondary | ICD-10-CM | POA: Diagnosis not present

## 2020-12-29 DIAGNOSIS — M069 Rheumatoid arthritis, unspecified: Secondary | ICD-10-CM | POA: Diagnosis not present

## 2020-12-29 DIAGNOSIS — E1142 Type 2 diabetes mellitus with diabetic polyneuropathy: Secondary | ICD-10-CM | POA: Diagnosis not present

## 2020-12-29 NOTE — Progress Notes (Deleted)
[facility]  [address] [place],  [place]  [address] [phone]  Clinic Day:  12/29/2020  Referring physician: Marco Collie, MD   CHIEF COMPLAINT:  CC: ***  Current Treatment:  ***   HISTORY OF PRESENT ILLNESS:  Gloria Best is a 80 y.o. female with a history of ***    INTERVAL HISTORY:  Gloria Best is here today for routine follow up of her ***. She denies fevers or chills. She  denies pain. Her appetite is good. Her weight {Weight change:10426}.  REVIEW OF SYSTEMS:  Review of Systems - Oncology   VITALS:  Blood pressure 125/60, pulse 67, temperature 97.8 F (36.6 C), temperature source Oral, resp. rate 20, height 4\' 11"  (1.499 m), weight 171 lb 1.6 oz (77.6 kg), SpO2 93 %.  Wt Readings from Last 3 Encounters:  12/27/20 171 lb 1.6 oz (77.6 kg)  12/05/20 173 lb 4 oz (78.6 kg)  06/01/20 165 lb 9 oz (75.1 kg)    Body mass index is 34.56 kg/m.  Performance status (ECOG): {CHL ONC [id number]  PHYSICAL EXAM:  Physical Exam  LABS:   CBC Latest Ref Rng & Units 12/27/2020 12/02/2020 09/21/2020  WBC - 9.6 11.0 8.8  Hemoglobin 12.0 - 16.0 9.7(A) 9.6(A) 10.5(A)  Hematocrit 36 - 46 30(A) 30(A) 32(A)  Platelets 150 - 399 166 247 220   CMP Latest Ref Rng & Units 12/02/2020 09/21/2020 08/15/2020  Glucose 70 - 99 mg/dL - - 124(H)  BUN 4 - 21 49(A) 41(A) 39(H)  Creatinine 0.5 - 1.1 3.1(A) 2.7(A) 2.57(H)  Sodium 137 - 147 136(A) 140 139  Potassium 3.4 - 5.3 4.5 4.6 5.3(H)  Chloride 99 - 108 100 104 106  CO2 13 - 22 28(A) 26(A) 23  Calcium 8.7 - 10.7 9.6 8.3(A) 8.4(L)  Total Protein 6.5 - 8.1 g/dL - - -  Total Bilirubin 0.3 - 1.2 mg/dL - - -  Alkaline Phos 25 - 125 76 69 -  AST 13 - 35 37(A) 26 -  ALT 7 - 35 17 12 -     No results found for: CEA1 / No results found for: CEA1 No results found for: PSA1 No results found for: IOX735 No results found for: CAN125  No results found for: Ronnald Ramp, A1GS, A2GS,  Violet Baldy, MSPIKE, SPEI Lab Results  Component Value Date   TIBC 245 12/27/2020   TIBC 233 12/02/2020   FERRITIN 534 12/27/2020   FERRITIN 334 12/02/2020   IRONPCTSAT 14.6 12/27/2020   IRONPCTSAT 14.1 12/02/2020   Lab Results  Component Value Date   LDH 202 03/07/2011    STUDIES:  No results found.    HISTORY:   Past Medical History:  Diagnosis Date  . Acquired hallux valgus 02/21/2017  . Acute renal failure (ARF) ([facility])   . Altered mental status   . Anemia    hx of blood transfusion  . Anxiety   . Arthritis    RA  . Asthma   . At risk for adverse drug event 12/14/2016   12/11/16 markedly somnulent post operative day 1  RIOSORD (Risk Index for Overdose or Serious Opioid -induced Respiratory Depression Risk ) is a tool which determines that risk over the  ensuing 6 months . Based on point total for personal medical history and present drug therapies ; the risk : benefit ratio of any drug regimen can be defined . Significant chronic kidney disease...8 Sleep apnea...5 Active prescriptions for: Antidepressant...8; Scoring:0-4 points... 2%  risk;5-7...5%;8-9...7%;10-17...15%;18-25...30%;26-41...55%; >42 points...83%. Total points 21 ; 6 month risk 30% of overdose or death   . 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 Bladder disorder    having difficulty voiding, requires in and out catheter 2x day and prn; denied current need for I&O cath 08/27/16  . Bronchitis    hx of  . Carpal tunnel syndrome of left wrist    hx of  . Chronic back pain   . Chronic kidney disease   . Constipated   . Depression    takes medication for   . Dyslipidemia 04/23/2016  . Dysrhythmia    takes cardizem, sees Dr. Marco Collie primary  . Elevated WBC count 12/21/2016  . Fibromyalgia    takes lyrica  . GERD (gastroesophageal reflux disease)   . H/O hiatal hernia    hx of   . Heart murmur   . Hematemesis with nausea   . History of foot ulcer 02/21/2017  . Hypertension   .  Osteoarthritis of right hip 06/09/2012  . Pneumonia    hx of  . Postoperative anemia due to acute blood loss 06/12/2012   Decreased in hemoglobin from 9 to 7.9  . Postoperative urinary retention 12/13/2016   Does have a history of some urinary retention and in and out catheterization briefly in 08/2016.  Marland Kitchen Protein-calorie malnutrition, severe 12/26/2016  . Sleep apnea    no CPAP  . Tailor's bunion of left foot 11/15/2016  . 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 Unilateral primary osteoarthritis, right knee 12/10/2016   Right knee severe osteoarthritis, tricompartment.  . Urinary tract infection    hx of  . Wears dentures    top    Past Surgical History:  Procedure Laterality Date  . ABDOMINAL HYSTERECTOMY    . APPENDECTOMY    . BACK SURGERY      x 3  . CARPAL TUNNEL RELEASE     left side only  . CATARACT EXTRACTION     bilateral  . CESAREAN SECTION    . COLON SURGERY     "due to large polyp"  . COLONOSCOPY    . CYSTOSCOPY WITH BIOPSY N/A 11/27/2017   Procedure: CYSTOSCOPY WITH BIOPSY OF BLADDER;  Surgeon: Ceasar Mons, MD;  Location: [facility];  Service: Urology;  Laterality: N/A;  . ESOPHAGOGASTRODUODENOSCOPY (EGD) WITH PROPOFOL N/A 12/26/2016   Procedure: ESOPHAGOGASTRODUODENOSCOPY (EGD) WITH PROPOFOL;  Surgeon: Doran Stabler, MD;  Location: [facility];  Service: Endoscopy;  Laterality: N/A;  . FOOT SURGERY    . HERNIA REPAIR    . JOINT REPLACEMENT    . SPINE SURGERY    . TENOLYSIS Left 11/12/2013   Procedure: LEFT TENDON GRAFT RECONSTRUCTION OF LEFT LONG FINGER SAGITTAL FIBERS CORD RELEASE;  Surgeon: Cammie Sickle., MD;  Location: [facility];  Service: Orthopedics;  Laterality: Left;  . TOTAL HIP ARTHROPLASTY  06/09/2012   Procedure: TOTAL HIP ARTHROPLASTY;  Surgeon: [name], MD;  Location: [facility];  Service: Orthopedics;  Laterality: Right;  Right total hip replacement  . TOTAL KNEE  ARTHROPLASTY Right 12/10/2016   Procedure: RIGHT TOTAL KNEE ARTHROPLASTY;  Surgeon: [name], MD;  Location: [facility];  Service: Orthopedics;  Laterality: Right;    Family History  Problem Relation Age of Onset  . Diabetes Mother   . Diabetes Father   . Diabetes Sister   . Diabetes Brother   .  Heart disease Neg Hx   . Stroke Neg Hx   . Cancer Neg Hx     Social History:  reports that she quit smoking about 36 years ago. She has never used smokeless tobacco. She reports that she does not drink alcohol and does not use drugs.The patient is {Blank single:19197::"alone","accompanied by"} *** today.  Allergies:  Allergies  Allergen Reactions  . Adhesive [Tape] Other (See Comments)    Tears skin  . Amlodipine Besylate Other (See Comments) and Itching  . Latex Other (See Comments)    Blisters  . Lotrel [Amlodipine Besy-Benazepril Hcl] Itching and Cough    Current Medications: Current Outpatient Medications  Medication Sig Dispense Refill  . calcium-vitamin D (OSCAL WITH D) 500-200 MG-UNIT TABS tablet Take 1 tablet by mouth daily.    . Fluticasone-Umeclidin-Vilant (TRELEGY ELLIPTA) 100-62.5-25 MCG/INH AEPB     . isosorbide mononitrate (IMDUR) 30 MG 24 hr tablet Take 1 tablet by mouth every evening.    . potassium chloride SA (KLOR-CON) 20 MEQ tablet     . acetaminophen (TYLENOL) 500 MG tablet Take by mouth.    Marland Kitchen albuterol (PROVENTIL) (2.5 MG/3ML) 0.083% nebulizer solution Take 2.5 mg by nebulization every 6 (six) hours as needed for wheezing or shortness of breath.    Marland Kitchen apixaban (ELIQUIS) 2.5 MG TABS tablet Take by mouth 2 (two) times daily.    . ARIPiprazole (ABILIFY) 5 MG tablet Take 5 mg by mouth daily.     Marland Kitchen atorvastatin (LIPITOR) 80 MG tablet Take 80 mg by mouth daily at 6 PM.     . diltiazem (CARDIZEM CD) 180 MG 24 hr capsule     . escitalopram (LEXAPRO) 10 MG tablet Take 10 mg by mouth daily.    Marland Kitchen esomeprazole (NEXIUM) 40 MG capsule Take 40 mg by mouth daily at 12 noon.    .  ferrous sulfate 325 (65 FE) MG tablet Take 1 tablet (325 mg total) by mouth 2 (two) times daily with a meal. (Patient taking differently: Take 325 mg by mouth daily with breakfast. ) 60 tablet 3  . flecainide (TAMBOCOR) 50 MG tablet Take by mouth.    . folic acid (FOLVITE) 1 MG tablet Take 1 mg by mouth daily.     . furosemide (LASIX) 20 MG tablet Take 20 mg by mouth in the morning, at noon, and at bedtime.     . hydrALAZINE (APRESOLINE) 10 MG tablet Take 10 mg by mouth 3 (three) times daily.    . hydroxychloroquine (PLAQUENIL) 200 MG tablet Take 200 mg by mouth 2 (two) times daily.     Marland Kitchen inFLIXimab (REMICADE) 100 MG injection Inject 100 mg into the vein every 30 (thirty) days.     Marland Kitchen ipratropium (ATROVENT HFA) 17 MCG/ACT inhaler Inhale into the lungs.    . methenamine (HIPREX) 1 g tablet Take 1 g by mouth 2 (two) times daily.    . metolazone (ZAROXOLYN) 2.5 MG tablet Take 2.5 mg by mouth daily.    Marland Kitchen MYRBETRIQ 50 MG TB24 tablet Take 50 mg by mouth daily.     . Omega-3 Fatty Acids (FISH OIL) 1000 MG CAPS Take 1,000 mg by mouth daily.    Marland Kitchen oxyCODONE-acetaminophen (PERCOCET/ROXICET) 5-325 MG tablet Take 1 tablet by mouth 2 (two) times daily as needed.    . phenazopyridine (PYRIDIUM) 100 MG tablet Take 1 tablet by mouth every 8 (eight) hours.    . polyethylene glycol (MIRALAX / GLYCOLAX) packet Take 17 g by mouth daily. (Patient  taking differently: Take 17 g by mouth daily as needed for moderate constipation. ) 14 each 0  . Potassium Gluconate 2.5 MEQ TABS Take by mouth.    . pramipexole (MIRAPEX) 0.5 MG tablet Take 0.5 mg by mouth at bedtime.    . pregabalin (LYRICA) 100 MG capsule Take 100 mg by mouth 3 (three) times daily.    . tamsulosin (FLOMAX) 0.4 MG CAPS capsule Take by mouth.    . Vitamin D, Ergocalciferol, (DRISDOL) 50000 units CAPS capsule Take 50,000 Units by mouth every 30 (thirty) days.     No current facility-administered medications for this visit.     ASSESSMENT & PLAN:    Assessment:  Gloria Best is a 80 y.o. female ***  Plan: ***  The patient understands the plans discussed today and is in agreement with them.  She knows to contact our office if she develops concerns prior to her next appointment.   I provided *** minutes (10:24 AM - 10:24 AM) of face-to-face time during this this encounter and > 50% was spent counseling as documented under my assessment and plan.    Melodye Ped, NP

## 2020-12-29 NOTE — Progress Notes (Signed)
[facility]  [address] [place],  [place]  [address] [phone]  Clinic Day:  12/30/2020  Referring physician: Marco Collie, MD    CHIEF COMPLAINT:  CC: An 80 year old with history of anemia of chronic disease here for evaluation after hospitalization for CHF to assess iron levels  Current Treatment:  IV Feraheme   HISTORY OF PRESENT ILLNESS:  Gloria Best is a 80 y.o. female with anemia of chronic disease and chronic kidney disease, who also has B12 deficiency and intermittent iron deficiency.  She previously received Procrit, but has not had this since 2008.  She has been on and off B12 injections monthly.  She has occasionally required transfusion, as well as intravenous iron.  Evaluation revealed iron deficiency, which is felt to be due to chronic gastrointestinal blood loss.  She has had Barrett's esophagus and underwent fundoplication previously.  She has a previous history of right hemicolectomy for benign disease.  EGD and colonoscopy in February 2015 revealed ulceration at the anastomosis.  She had been on Pradaxa due to atrial fibrillation.  She also has rheumatoid arthritis, with associated chronic leukocytosis.  She is also on Remicade and Plaquenil for the rheumatoid arthritis.  In September 2017, she had recurrent iron deficiency with a decrease in her hemoglobin to 8.6.  She received IV Feraheme again at that time.  In October, her hemoglobin was back up to 10.3.  In March 2018, her hemoglobin had dropped down to 8.6 after a right knee replacement in February.  She later developed a urinary tract infection, as well as atrial fibrillation and had recurrent anemia.  An EGD during that admission, apparently did not reveal any sites of bleeding even though she had described hematemesis.  In May 2018, she required transfusion of 2 units of packed red blood cells after her hemoglobin dropped down to 7.6.Marland Kitchen  She also received IV Feraheme.  She  saw Dr. Lyda Jester again and he repeated an EGD in May which did not reveal any abnormality or source of significant GI blood loss.  Her hemoglobin was 8.9 the day of the EGD.    She received 2 units of packed red blood cells again in June 2018, when her hemoglobin dropped to 8 and she was symptomatic.  Evaluation of her anemia revealed recurrent B12 deficiency with a low normal B12 level, but a significantly elevated MMA.  Iron studies were normal.  She was started back on monthly B12 injections in mid June 2018.  We referred her to a nephrologist, as her creatinine was 2.6 in June and 2.2 in July.  Unfortunately, she was hospitalized again in July with worsening anemia, GERD, and CHF exacerbation.  She received Feraheme 100mg  IV while in the hospital.  Dr. Lyda Jester saw her and performed a colonoscopy, which did show an ulceration of the small bowel. Pathology reveals inflamed intestinal mucosa with minimal villous morphology and focal acute ulceration.  She was placed on pantoprazole 40 mg twice daily.  Her hemoglobin was 9.1 at the time of discharge from the hospital, but dropped down to 6.7 in August and she received 2 units of packed red blood cells again.  Pradaxa was discontinued due to the gastrointestinal blood loss.  At her visit later in August, her hemoglobin was 10.3.  She saw Dr. Posey Pronto at [facility] in September 2018 for chronic kidney disease.  Her hemoglobin continued to slowly improve and had been fairly stable, running above 11, since February 2019.  There was a suspicious lesion of the left upper anterior chest wall, so she was referred to dermatologist in June 2019 and underwent removal of a skin cancer from the left anterior chest wall.  She continues to follow up with Dr. Nyra Capes regularly for her heart failure.  She continues to follow with Dr. Posey Pronto regarding her kidney failure. She was seen for routine follow-up in June 2020, her hemoglobin was 12.8.  She was referred back in July  when her hemoglobin was 9.4 at Dr. Meta Hatchet.  Stool Hemoccult were obtained and 2/3 were positive, so she was referred back to Dr. Lyda Jester.  EGD and colonoscopy in August did not reveal any significant source of blood loss, there was slightly friable gastric mucosa.  Biopsy of the small bowel was negative.   INTERVAL HISTORY:  Gloria Best has been added onto the schedule after Dr. Nyra Capes called stating that her hemoglobin was 8.6. She was evaluated in late January and was found to be iron deficient. She received one out of two iron infusions. She was hospitalized with complications of CHF prior to her second infusion. She was discharged recently and started on Coreg by her cardiologist. However, she had only been on the medication for a few days when the patient and family noticed a severe drop in heart rate with each dose. She saw her PCP today who elected to stop the Coreg. Hemoglobin in the PCP office was noted to be 8.6 and the patient was sent here for possible Procrit injection. Re-evaluation of her CBC here revealed a hemoglobin 9.7 with borderline low iron studies. She complains of severe fatigue and relates some of this to her recent hospitalization and complications with Coreg. She states that her heart rate returns to normal a while after taking the Coreg but has been noted to go as low as the thirties which is was in her PCP office today. She recovered and in our clinic has a heart rate of 67. She complains of shortness of breath that worsens with any activity. She denies cough or chest pain. She denies fever, chills, nausea or vomiting. She denies issue with bowel or bladder. Her appetite has been poor.   REVIEW OF SYSTEMS:  Review of Systems  Constitutional: Positive for appetite change and fatigue. Negative for chills, diaphoresis, fever and unexpected weight change.  HENT:   Negative for hearing loss, lump/mass, mouth sores, nosebleeds, sore throat, tinnitus, trouble swallowing and voice  change.   Eyes: Negative for eye problems and icterus.  Respiratory: Positive for shortness of breath. Negative for chest tightness, cough, hemoptysis and wheezing.   Cardiovascular: Negative for chest pain, leg swelling and palpitations.  Gastrointestinal: Negative for abdominal distention, abdominal pain, blood in stool, constipation, diarrhea, nausea, rectal pain and vomiting.  Endocrine: Negative for hot flashes.  Genitourinary: Negative for bladder incontinence, difficulty urinating, dyspareunia, dysuria, frequency, hematuria and nocturia.   Musculoskeletal: Negative for arthralgias, back pain, flank pain, gait problem, myalgias, neck pain and neck stiffness.  Skin: Negative for itching, rash and wound.  Neurological: Positive for extremity weakness. Negative for dizziness, gait problem, headaches, light-headedness, numbness, seizures and speech difficulty.  Hematological: Negative for adenopathy. Does not bruise/bleed easily.  Psychiatric/Behavioral: Negative for confusion, decreased concentration, depression, sleep disturbance and suicidal ideas. The patient is not nervous/anxious.      VITALS:  Blood pressure 125/60, pulse 67, temperature 97.8 F (36.6 C), temperature source Oral, resp. rate 20, height 4\' 11"  (1.499 m), weight 171 lb 1.6 oz (77.6 kg), SpO2  93 %.  Wt Readings from Last 3 Encounters:  12/30/20 170 lb 1.9 oz (77.2 kg)  12/27/20 171 lb 1.6 oz (77.6 kg)  12/05/20 173 lb 4 oz (78.6 kg)    Body mass index is 34.56 kg/m.  Performance status (ECOG): 1 - Symptomatic but completely ambulatory  PHYSICAL EXAM:  Physical Exam Constitutional:      General: She is not in acute distress.    Appearance: Normal appearance. She is normal weight. She is not ill-appearing, toxic-appearing or diaphoretic.  HENT:     Head: Normocephalic and atraumatic.     Nose: Nose normal. No congestion or rhinorrhea.     Mouth/Throat:     Mouth: Mucous membranes are moist.     Pharynx:  Oropharynx is clear. No oropharyngeal exudate or posterior oropharyngeal erythema.  Eyes:     General: No scleral icterus.       Right eye: No discharge.        Left eye: No discharge.     Extraocular Movements: Extraocular movements intact.     Conjunctiva/sclera: Conjunctivae normal.     Pupils: Pupils are equal, round, and reactive to light.  Neck:     Vascular: No carotid bruit.  Cardiovascular:     Rate and Rhythm: Regular rhythm. Bradycardia present.     Pulses: Normal pulses.     Heart sounds: Normal heart sounds. No murmur heard. No friction rub. No gallop.   Pulmonary:     Effort: Respiratory distress present.     Breath sounds: Normal breath sounds. No stridor. No wheezing, rhonchi or rales.     Comments: Bibasilar crackles left greater than right Chest:     Chest wall: No tenderness.  Abdominal:     General: Abdomen is flat. Bowel sounds are normal. There is no distension.     Palpations: Abdomen is soft. There is no mass.     Tenderness: There is no abdominal tenderness. There is no right CVA tenderness, left CVA tenderness, guarding or rebound.     Hernia: No hernia is present.  Musculoskeletal:        General: No swelling, tenderness, deformity or signs of injury. Normal range of motion.     Cervical back: Normal range of motion and neck supple. No rigidity or tenderness.     Right lower leg: No edema.     Left lower leg: No edema.  Lymphadenopathy:     Cervical: No cervical adenopathy.  Skin:    General: Skin is warm and dry.     Capillary Refill: Capillary refill takes less than 2 seconds.     Coloration: Skin is not jaundiced or pale.     Findings: No bruising, erythema, lesion or rash.  Neurological:     General: No focal deficit present.     Mental Status: She is alert and oriented to person, place, and time. Mental status is at baseline.     Cranial Nerves: No cranial nerve deficit.     Sensory: No sensory deficit.     Motor: No weakness.      Coordination: Coordination normal.     Gait: Gait normal.     Deep Tendon Reflexes: Reflexes normal.  Psychiatric:        Mood and Affect: Mood normal.        Behavior: Behavior normal.        Thought Content: Thought content normal.        Judgment: Judgment normal.     LABS:  CBC Latest Ref Rng & Units 12/27/2020 12/02/2020 09/21/2020  WBC - 9.6 11.0 8.8  Hemoglobin 12.0 - 16.0 9.7(A) 9.6(A) 10.5(A)  Hematocrit 36 - 46 30(A) 30(A) 32(A)  Platelets 150 - 399 166 247 220   CMP Latest Ref Rng & Units 12/02/2020 09/21/2020 08/15/2020  Glucose 70 - 99 mg/dL - - 124(H)  BUN 4 - 21 49(A) 41(A) 39(H)  Creatinine 0.5 - 1.1 3.1(A) 2.7(A) 2.57(H)  Sodium 137 - 147 136(A) 140 139  Potassium 3.4 - 5.3 4.5 4.6 5.3(H)  Chloride 99 - 108 100 104 106  CO2 13 - 22 28(A) 26(A) 23  Calcium 8.7 - 10.7 9.6 8.3(A) 8.4(L)  Total Protein 6.5 - 8.1 g/dL - - -  Total Bilirubin 0.3 - 1.2 mg/dL - - -  Alkaline Phos 25 - 125 76 69 -  AST 13 - 35 37(A) 26 -  ALT 7 - 35 17 12 -     Lab Results  Component Value Date   TIBC 245 12/27/2020   TIBC 233 12/02/2020   FERRITIN 534 12/27/2020   FERRITIN 334 12/02/2020   IRONPCTSAT 14.6 12/27/2020   IRONPCTSAT 14.1 12/02/2020   Lab Results  Component Value Date   LDH 202 03/07/2011     STUDIES:  No results found.   Allergies:  Allergies  Allergen Reactions  . Adhesive [Tape] Other (See Comments)    Tears skin  . Amlodipine Besylate Other (See Comments) and Itching  . Latex Other (See Comments)    Blisters  . Lotrel [Amlodipine Besy-Benazepril Hcl] Itching and Cough    Current Medications: Current Outpatient Medications  Medication Sig Dispense Refill  . calcium-vitamin D (OSCAL WITH D) 500-200 MG-UNIT TABS tablet Take 1 tablet by mouth daily.    . Fluticasone-Umeclidin-Vilant (TRELEGY ELLIPTA) 100-62.5-25 MCG/INH AEPB     . isosorbide mononitrate (IMDUR) 30 MG 24 hr tablet Take 1 tablet by mouth every evening.    . potassium chloride SA  (KLOR-CON) 20 MEQ tablet     . acetaminophen (TYLENOL) 500 MG tablet Take by mouth.    Marland Kitchen albuterol (PROVENTIL) (2.5 MG/3ML) 0.083% nebulizer solution Take 2.5 mg by nebulization every 6 (six) hours as needed for wheezing or shortness of breath.    Marland Kitchen apixaban (ELIQUIS) 2.5 MG TABS tablet Take by mouth 2 (two) times daily.    . ARIPiprazole (ABILIFY) 5 MG tablet Take 5 mg by mouth daily.     Marland Kitchen atorvastatin (LIPITOR) 80 MG tablet Take 80 mg by mouth daily at 6 PM.     . diltiazem (CARDIZEM CD) 180 MG 24 hr capsule     . escitalopram (LEXAPRO) 10 MG tablet Take 10 mg by mouth daily.    Marland Kitchen esomeprazole (NEXIUM) 40 MG capsule Take 40 mg by mouth daily at 12 noon.    . ferrous sulfate 325 (65 FE) MG tablet Take 1 tablet (325 mg total) by mouth 2 (two) times daily with a meal. (Patient taking differently: Take 325 mg by mouth daily with breakfast. ) 60 tablet 3  . flecainide (TAMBOCOR) 50 MG tablet Take by mouth.    . folic acid (FOLVITE) 1 MG tablet Take 1 mg by mouth daily.     . furosemide (LASIX) 20 MG tablet Take 20 mg by mouth in the morning, at noon, and at bedtime.     . hydrALAZINE (APRESOLINE) 10 MG tablet Take 10 mg by mouth 3 (three) times daily.    . hydroxychloroquine (PLAQUENIL) 200 MG tablet Take  200 mg by mouth 2 (two) times daily.     Marland Kitchen inFLIXimab (REMICADE) 100 MG injection Inject 100 mg into the vein every 30 (thirty) days.     Marland Kitchen ipratropium (ATROVENT HFA) 17 MCG/ACT inhaler Inhale into the lungs.    . methenamine (HIPREX) 1 g tablet Take 1 g by mouth 2 (two) times daily.    . metolazone (ZAROXOLYN) 2.5 MG tablet Take 2.5 mg by mouth daily.    Marland Kitchen MYRBETRIQ 50 MG TB24 tablet Take 50 mg by mouth daily.     . Omega-3 Fatty Acids (FISH OIL) 1000 MG CAPS Take 1,000 mg by mouth daily.    Marland Kitchen oxyCODONE-acetaminophen (PERCOCET/ROXICET) 5-325 MG tablet Take 1 tablet by mouth 2 (two) times daily as needed.    . phenazopyridine (PYRIDIUM) 100 MG tablet Take 1 tablet by mouth every 8 (eight) hours.     . polyethylene glycol (MIRALAX / GLYCOLAX) packet Take 17 g by mouth daily. (Patient taking differently: Take 17 g by mouth daily as needed for moderate constipation. ) 14 each 0  . Potassium Gluconate 2.5 MEQ TABS Take by mouth.    . pramipexole (MIRAPEX) 0.5 MG tablet Take 0.5 mg by mouth at bedtime.    . pregabalin (LYRICA) 100 MG capsule Take 100 mg by mouth 3 (three) times daily.    . tamsulosin (FLOMAX) 0.4 MG CAPS capsule Take by mouth.    . Vitamin D, Ergocalciferol, (DRISDOL) 50000 units CAPS capsule Take 50,000 Units by mouth every 30 (thirty) days.     No current facility-administered medications for this visit.     ASSESSMENT & PLAN:   Assessment:   1.  Chronic anemia, which is felt to be multifactorial, due to chronic kidney disease and chronic GI blood loss.  Iron studies in the past have been consistent with anemia of chronic disease, but periodically she does develop iron deficiency which does require IV iron.  Her hemoglobin continues to fluctuate up and down. Iron studies are borderline normal today. She can begin Retacrit injections based on today's evaluation in hopes of keeping her hemoglobin above 10.  Plan: We will plan for Retacrit injections every 2 weeks beginning this week and re-evaluate after 2 cycles. She will have repeat CBC, CMP and iron studies at that time.   She verbalizes understanding of and agreement to the plans discussed today. She knows to call the office should any new questions or concerns arise.   Melodye Ped, NP [facility] AT [facility] [address] [place] [address] Dept: [phone] Dept Fax: [phone]

## 2020-12-30 ENCOUNTER — Other Ambulatory Visit: Payer: Self-pay

## 2020-12-30 ENCOUNTER — Inpatient Hospital Stay: Payer: Medicare Other

## 2020-12-30 ENCOUNTER — Other Ambulatory Visit: Payer: Self-pay | Admitting: Pharmacist

## 2020-12-30 ENCOUNTER — Inpatient Hospital Stay: Payer: Medicare Other | Admitting: Oncology

## 2020-12-30 VITALS — BP 124/39 | HR 58 | Temp 97.7°F | Resp 20 | Ht 59.0 in | Wt 170.1 lb

## 2020-12-30 DIAGNOSIS — I509 Heart failure, unspecified: Secondary | ICD-10-CM | POA: Diagnosis not present

## 2020-12-30 DIAGNOSIS — E538 Deficiency of other specified B group vitamins: Secondary | ICD-10-CM | POA: Diagnosis not present

## 2020-12-30 DIAGNOSIS — Z7901 Long term (current) use of anticoagulants: Secondary | ICD-10-CM | POA: Diagnosis not present

## 2020-12-30 DIAGNOSIS — Z79899 Other long term (current) drug therapy: Secondary | ICD-10-CM | POA: Diagnosis not present

## 2020-12-30 DIAGNOSIS — E1122 Type 2 diabetes mellitus with diabetic chronic kidney disease: Secondary | ICD-10-CM | POA: Diagnosis not present

## 2020-12-30 DIAGNOSIS — K219 Gastro-esophageal reflux disease without esophagitis: Secondary | ICD-10-CM | POA: Diagnosis not present

## 2020-12-30 DIAGNOSIS — Z9049 Acquired absence of other specified parts of digestive tract: Secondary | ICD-10-CM | POA: Diagnosis not present

## 2020-12-30 DIAGNOSIS — R001 Bradycardia, unspecified: Secondary | ICD-10-CM | POA: Diagnosis not present

## 2020-12-30 DIAGNOSIS — I4891 Unspecified atrial fibrillation: Secondary | ICD-10-CM | POA: Diagnosis not present

## 2020-12-30 DIAGNOSIS — I130 Hypertensive heart and chronic kidney disease with heart failure and stage 1 through stage 4 chronic kidney disease, or unspecified chronic kidney disease: Secondary | ICD-10-CM | POA: Diagnosis not present

## 2020-12-30 DIAGNOSIS — N189 Chronic kidney disease, unspecified: Secondary | ICD-10-CM | POA: Diagnosis not present

## 2020-12-30 DIAGNOSIS — D631 Anemia in chronic kidney disease: Secondary | ICD-10-CM

## 2020-12-30 DIAGNOSIS — Z96651 Presence of right artificial knee joint: Secondary | ICD-10-CM | POA: Diagnosis not present

## 2020-12-30 DIAGNOSIS — M069 Rheumatoid arthritis, unspecified: Secondary | ICD-10-CM | POA: Diagnosis not present

## 2020-12-30 DIAGNOSIS — E1142 Type 2 diabetes mellitus with diabetic polyneuropathy: Secondary | ICD-10-CM | POA: Diagnosis not present

## 2020-12-30 MED ORDER — EPOETIN ALFA-EPBX 40000 UNIT/ML IJ SOLN
20000.0000 [IU] | Freq: Once | INTRAMUSCULAR | Status: AC
  Administered 2020-12-30: 20000 [IU] via SUBCUTANEOUS

## 2020-12-30 MED ORDER — EPOETIN ALFA-EPBX 10000 UNIT/ML IJ SOLN
INTRAMUSCULAR | Status: AC
  Filled 2020-12-30: qty 2

## 2020-12-30 NOTE — Patient Instructions (Signed)

## 2020-12-30 NOTE — Progress Notes (Signed)
PT STABLE AT TIME OF DISCHARGE 

## 2021-01-02 DIAGNOSIS — I130 Hypertensive heart and chronic kidney disease with heart failure and stage 1 through stage 4 chronic kidney disease, or unspecified chronic kidney disease: Secondary | ICD-10-CM | POA: Diagnosis not present

## 2021-01-02 DIAGNOSIS — J440 Chronic obstructive pulmonary disease with acute lower respiratory infection: Secondary | ICD-10-CM | POA: Diagnosis present

## 2021-01-02 DIAGNOSIS — Z79899 Other long term (current) drug therapy: Secondary | ICD-10-CM | POA: Diagnosis not present

## 2021-01-02 DIAGNOSIS — I5032 Chronic diastolic (congestive) heart failure: Secondary | ICD-10-CM | POA: Diagnosis not present

## 2021-01-02 DIAGNOSIS — R001 Bradycardia, unspecified: Secondary | ICD-10-CM | POA: Diagnosis not present

## 2021-01-02 DIAGNOSIS — Z91040 Latex allergy status: Secondary | ICD-10-CM | POA: Diagnosis not present

## 2021-01-02 DIAGNOSIS — R0602 Shortness of breath: Secondary | ICD-10-CM | POA: Diagnosis not present

## 2021-01-02 DIAGNOSIS — K219 Gastro-esophageal reflux disease without esophagitis: Secondary | ICD-10-CM | POA: Diagnosis present

## 2021-01-02 DIAGNOSIS — J189 Pneumonia, unspecified organism: Secondary | ICD-10-CM | POA: Diagnosis not present

## 2021-01-02 DIAGNOSIS — Z888 Allergy status to other drugs, medicaments and biological substances status: Secondary | ICD-10-CM | POA: Diagnosis not present

## 2021-01-02 DIAGNOSIS — E1142 Type 2 diabetes mellitus with diabetic polyneuropathy: Secondary | ICD-10-CM | POA: Diagnosis not present

## 2021-01-02 DIAGNOSIS — I5033 Acute on chronic diastolic (congestive) heart failure: Secondary | ICD-10-CM | POA: Diagnosis not present

## 2021-01-02 DIAGNOSIS — F419 Anxiety disorder, unspecified: Secondary | ICD-10-CM | POA: Diagnosis present

## 2021-01-02 DIAGNOSIS — I2510 Atherosclerotic heart disease of native coronary artery without angina pectoris: Secondary | ICD-10-CM | POA: Diagnosis present

## 2021-01-02 DIAGNOSIS — D513 Other dietary vitamin B12 deficiency anemia: Secondary | ICD-10-CM | POA: Diagnosis present

## 2021-01-02 DIAGNOSIS — N184 Chronic kidney disease, stage 4 (severe): Secondary | ICD-10-CM | POA: Diagnosis not present

## 2021-01-02 DIAGNOSIS — J811 Chronic pulmonary edema: Secondary | ICD-10-CM | POA: Diagnosis not present

## 2021-01-02 DIAGNOSIS — F32A Depression, unspecified: Secondary | ICD-10-CM | POA: Diagnosis present

## 2021-01-02 DIAGNOSIS — I361 Nonrheumatic tricuspid (valve) insufficiency: Secondary | ICD-10-CM | POA: Diagnosis not present

## 2021-01-02 DIAGNOSIS — N189 Chronic kidney disease, unspecified: Secondary | ICD-10-CM | POA: Diagnosis not present

## 2021-01-02 DIAGNOSIS — J9600 Acute respiratory failure, unspecified whether with hypoxia or hypercapnia: Secondary | ICD-10-CM | POA: Diagnosis not present

## 2021-01-02 DIAGNOSIS — D631 Anemia in chronic kidney disease: Secondary | ICD-10-CM | POA: Diagnosis not present

## 2021-01-02 DIAGNOSIS — M069 Rheumatoid arthritis, unspecified: Secondary | ICD-10-CM | POA: Diagnosis not present

## 2021-01-02 DIAGNOSIS — I340 Nonrheumatic mitral (valve) insufficiency: Secondary | ICD-10-CM | POA: Diagnosis not present

## 2021-01-02 DIAGNOSIS — E871 Hypo-osmolality and hyponatremia: Secondary | ICD-10-CM | POA: Diagnosis not present

## 2021-01-02 DIAGNOSIS — J441 Chronic obstructive pulmonary disease with (acute) exacerbation: Secondary | ICD-10-CM | POA: Diagnosis not present

## 2021-01-02 DIAGNOSIS — I4891 Unspecified atrial fibrillation: Secondary | ICD-10-CM | POA: Diagnosis present

## 2021-01-02 DIAGNOSIS — E1122 Type 2 diabetes mellitus with diabetic chronic kidney disease: Secondary | ICD-10-CM | POA: Diagnosis not present

## 2021-01-03 DIAGNOSIS — I5032 Chronic diastolic (congestive) heart failure: Secondary | ICD-10-CM | POA: Diagnosis not present

## 2021-01-03 DIAGNOSIS — N189 Chronic kidney disease, unspecified: Secondary | ICD-10-CM | POA: Diagnosis not present

## 2021-01-03 DIAGNOSIS — E871 Hypo-osmolality and hyponatremia: Secondary | ICD-10-CM | POA: Diagnosis not present

## 2021-01-03 DIAGNOSIS — D631 Anemia in chronic kidney disease: Secondary | ICD-10-CM | POA: Diagnosis not present

## 2021-01-05 DIAGNOSIS — R001 Bradycardia, unspecified: Secondary | ICD-10-CM | POA: Diagnosis not present

## 2021-01-05 DIAGNOSIS — I130 Hypertensive heart and chronic kidney disease with heart failure and stage 1 through stage 4 chronic kidney disease, or unspecified chronic kidney disease: Secondary | ICD-10-CM | POA: Diagnosis not present

## 2021-01-05 DIAGNOSIS — E1122 Type 2 diabetes mellitus with diabetic chronic kidney disease: Secondary | ICD-10-CM | POA: Diagnosis not present

## 2021-01-05 DIAGNOSIS — I4891 Unspecified atrial fibrillation: Secondary | ICD-10-CM | POA: Diagnosis not present

## 2021-01-05 DIAGNOSIS — E1142 Type 2 diabetes mellitus with diabetic polyneuropathy: Secondary | ICD-10-CM | POA: Diagnosis not present

## 2021-01-05 DIAGNOSIS — M069 Rheumatoid arthritis, unspecified: Secondary | ICD-10-CM | POA: Diagnosis not present

## 2021-01-06 ENCOUNTER — Telehealth: Payer: Self-pay | Admitting: *Deleted

## 2021-01-06 ENCOUNTER — Inpatient Hospital Stay: Payer: Medicare Other | Attending: Oncology

## 2021-01-06 DIAGNOSIS — N184 Chronic kidney disease, stage 4 (severe): Secondary | ICD-10-CM | POA: Insufficient documentation

## 2021-01-06 DIAGNOSIS — D631 Anemia in chronic kidney disease: Secondary | ICD-10-CM | POA: Insufficient documentation

## 2021-01-06 DIAGNOSIS — E538 Deficiency of other specified B group vitamins: Secondary | ICD-10-CM | POA: Insufficient documentation

## 2021-01-08 DIAGNOSIS — E1122 Type 2 diabetes mellitus with diabetic chronic kidney disease: Secondary | ICD-10-CM | POA: Diagnosis not present

## 2021-01-08 DIAGNOSIS — I4891 Unspecified atrial fibrillation: Secondary | ICD-10-CM | POA: Diagnosis not present

## 2021-01-08 DIAGNOSIS — E1142 Type 2 diabetes mellitus with diabetic polyneuropathy: Secondary | ICD-10-CM | POA: Diagnosis not present

## 2021-01-08 DIAGNOSIS — I130 Hypertensive heart and chronic kidney disease with heart failure and stage 1 through stage 4 chronic kidney disease, or unspecified chronic kidney disease: Secondary | ICD-10-CM | POA: Diagnosis not present

## 2021-01-08 DIAGNOSIS — M069 Rheumatoid arthritis, unspecified: Secondary | ICD-10-CM | POA: Diagnosis not present

## 2021-01-08 DIAGNOSIS — R001 Bradycardia, unspecified: Secondary | ICD-10-CM | POA: Diagnosis not present

## 2021-01-09 ENCOUNTER — Telehealth: Payer: Self-pay

## 2021-01-09 DIAGNOSIS — J9610 Chronic respiratory failure, unspecified whether with hypoxia or hypercapnia: Secondary | ICD-10-CM | POA: Diagnosis not present

## 2021-01-09 DIAGNOSIS — J449 Chronic obstructive pulmonary disease, unspecified: Secondary | ICD-10-CM | POA: Diagnosis not present

## 2021-01-09 DIAGNOSIS — J189 Pneumonia, unspecified organism: Secondary | ICD-10-CM | POA: Diagnosis not present

## 2021-01-09 DIAGNOSIS — Z7689 Persons encountering health services in other specified circumstances: Secondary | ICD-10-CM | POA: Diagnosis not present

## 2021-01-09 NOTE — Telephone Encounter (Addendum)
I reviewed Gloria P,NP, office note. She planned for pt to start the Retacrit injections to keep her Hgb above 10.  I called & LVM on identified answering machine @ Dr Nyra Capes office, notifying them that pt will not receive iron infusions. I asked them to call me back with any questions. Pt has appt tomorrow for Retacirt injection.   Other (Dr Nyra Capes request that we R/S IV iron infusions. Pt missed appt's due to hospitalization.)  Gloria Best, Gloria Roller A, PA-C  You 3 days ago   KM  We didn't plan further IV iron. Per Gloria's note on 2/22, planning Retacrit. Does Dr. Nyra Capes feel she needs iron? Thanks!   Message text    Per Gloria Best C,RN:   I was able to speak with [name] @ 1525. She reports that Dr Nyra Capes would like pt's iron infusion appt's to be R/S since pt was hospitalized.  It looks like she got 1 iron infusion, and has appt for 01/13/21 for Retacrit injection??     @ 1455 - I attempted call back, bBut phone rang & rang...never gave option to leave message.    [name] from [facility] LVM on nurse triage line requesting Best call back regarding iron infusions.

## 2021-01-10 DIAGNOSIS — I130 Hypertensive heart and chronic kidney disease with heart failure and stage 1 through stage 4 chronic kidney disease, or unspecified chronic kidney disease: Secondary | ICD-10-CM | POA: Diagnosis not present

## 2021-01-10 DIAGNOSIS — R001 Bradycardia, unspecified: Secondary | ICD-10-CM | POA: Diagnosis not present

## 2021-01-10 DIAGNOSIS — M069 Rheumatoid arthritis, unspecified: Secondary | ICD-10-CM | POA: Diagnosis not present

## 2021-01-10 DIAGNOSIS — E1142 Type 2 diabetes mellitus with diabetic polyneuropathy: Secondary | ICD-10-CM | POA: Diagnosis not present

## 2021-01-10 DIAGNOSIS — E1122 Type 2 diabetes mellitus with diabetic chronic kidney disease: Secondary | ICD-10-CM | POA: Diagnosis not present

## 2021-01-10 DIAGNOSIS — I4891 Unspecified atrial fibrillation: Secondary | ICD-10-CM | POA: Diagnosis not present

## 2021-01-12 DIAGNOSIS — E1122 Type 2 diabetes mellitus with diabetic chronic kidney disease: Secondary | ICD-10-CM | POA: Diagnosis not present

## 2021-01-12 DIAGNOSIS — R001 Bradycardia, unspecified: Secondary | ICD-10-CM | POA: Diagnosis not present

## 2021-01-12 DIAGNOSIS — I4891 Unspecified atrial fibrillation: Secondary | ICD-10-CM | POA: Diagnosis not present

## 2021-01-12 DIAGNOSIS — E1142 Type 2 diabetes mellitus with diabetic polyneuropathy: Secondary | ICD-10-CM | POA: Diagnosis not present

## 2021-01-12 DIAGNOSIS — M069 Rheumatoid arthritis, unspecified: Secondary | ICD-10-CM | POA: Diagnosis not present

## 2021-01-12 DIAGNOSIS — I130 Hypertensive heart and chronic kidney disease with heart failure and stage 1 through stage 4 chronic kidney disease, or unspecified chronic kidney disease: Secondary | ICD-10-CM | POA: Diagnosis not present

## 2021-01-13 ENCOUNTER — Inpatient Hospital Stay: Payer: Medicare Other

## 2021-01-16 ENCOUNTER — Other Ambulatory Visit: Payer: Self-pay

## 2021-01-16 ENCOUNTER — Inpatient Hospital Stay: Payer: Medicare Other

## 2021-01-16 VITALS — BP 120/53 | HR 65 | Temp 98.6°F | Resp 20 | Ht 59.0 in | Wt 165.0 lb

## 2021-01-16 DIAGNOSIS — D631 Anemia in chronic kidney disease: Secondary | ICD-10-CM

## 2021-01-16 DIAGNOSIS — E538 Deficiency of other specified B group vitamins: Secondary | ICD-10-CM

## 2021-01-16 DIAGNOSIS — N184 Chronic kidney disease, stage 4 (severe): Secondary | ICD-10-CM | POA: Diagnosis not present

## 2021-01-16 DIAGNOSIS — I129 Hypertensive chronic kidney disease with stage 1 through stage 4 chronic kidney disease, or unspecified chronic kidney disease: Secondary | ICD-10-CM | POA: Diagnosis not present

## 2021-01-16 DIAGNOSIS — N2581 Secondary hyperparathyroidism of renal origin: Secondary | ICD-10-CM | POA: Diagnosis not present

## 2021-01-16 MED ORDER — EPOETIN ALFA-EPBX 40000 UNIT/ML IJ SOLN: 20000.0000 [IU] | Freq: Once | INTRAMUSCULAR | Status: DC

## 2021-01-16 MED ORDER — CYANOCOBALAMIN 1000 MCG/ML IJ SOLN
1000.0000 ug | Freq: Once | INTRAMUSCULAR | Status: AC
  Administered 2021-01-16: 1000 ug via INTRAMUSCULAR

## 2021-01-16 MED ORDER — EPOETIN ALFA-EPBX 10000 UNIT/ML IJ SOLN
INTRAMUSCULAR | Status: AC
  Filled 2021-01-16: qty 2

## 2021-01-16 MED ORDER — EPOETIN ALFA-EPBX 10000 UNIT/ML IJ SOLN
20000.0000 [IU] | Freq: Once | INTRAMUSCULAR | Status: AC
  Administered 2021-01-16: 20000 [IU] via SUBCUTANEOUS

## 2021-01-16 MED ORDER — CYANOCOBALAMIN 1000 MCG/ML IJ SOLN
INTRAMUSCULAR | Status: AC
  Filled 2021-01-16: qty 1

## 2021-01-16 NOTE — Patient Instructions (Signed)
Epoetin Alfa injection What is this medicine? EPOETIN ALFA (e POE e tin AL fa) helps your body make more red blood cells. This medicine is used to treat anemia caused by chronic kidney disease, cancer chemotherapy, or HIV-therapy. It may also be used before surgery if you have anemia. This medicine may be used for other purposes; ask your health care provider or pharmacist if you have questions. COMMON BRAND NAME(S): Epogen, Procrit, Retacrit What should I tell my health care provider before I take this medicine? They need to know if you have any of these conditions:  cancer  heart disease  high blood pressure  history of blood clots  history of stroke  low levels of folate, iron, or vitamin B12 in the blood  seizures  an unusual or allergic reaction to erythropoietin, albumin, benzyl alcohol, hamster proteins, other medicines, foods, dyes, or preservatives  pregnant or trying to get pregnant  breast-feeding How should I use this medicine? This medicine is for injection into a vein or under the skin. It is usually given by a health care professional in a hospital or clinic setting. If you get this medicine at home, you will be taught how to prepare and give this medicine. Use exactly as directed. Take your medicine at regular intervals. Do not take your medicine more often than directed. It is important that you put your used needles and syringes in a special sharps container. Do not put them in a trash can. If you do not have a sharps container, call your pharmacist or healthcare provider to get one. A special MedGuide will be given to you by the pharmacist with each prescription and refill. Be sure to read this information carefully each time. Talk to your pediatrician regarding the use of this medicine in children. While this drug may be prescribed for selected conditions, precautions do apply. Overdosage: If you think you have taken too much of this medicine contact a poison  control center or emergency room at once. NOTE: This medicine is only for you. Do not share this medicine with others. What if I miss a dose? If you miss a dose, take it as soon as you can. If it is almost time for your next dose, take only that dose. Do not take double or extra doses. What may interact with this medicine? Interactions have not been studied. This list may not describe all possible interactions. Give your health care provider a list of all the medicines, herbs, non-prescription drugs, or dietary supplements you use. Also tell them if you smoke, drink alcohol, or use illegal drugs. Some items may interact with your medicine. What should I watch for while using this medicine? Your condition will be monitored carefully while you are receiving this medicine. You may need blood work done while you are taking this medicine. This medicine may cause a decrease in vitamin B6. You should make sure that you get enough vitamin B6 while you are taking this medicine. Discuss the foods you eat and the vitamins you take with your health care professional. What side effects may I notice from receiving this medicine? Side effects that you should report to your doctor or health care professional as soon as possible:  allergic reactions like skin rash, itching or hives, swelling of the face, lips, or tongue  seizures  signs and symptoms of a blood clot such as breathing problems; changes in vision; chest pain; severe, sudden headache; pain, swelling, warmth in the leg; trouble speaking; sudden numbness or  weakness of the face, arm or leg  signs and symptoms of a stroke like changes in vision; confusion; trouble speaking or understanding; severe headaches; sudden numbness or weakness of the face, arm or leg; trouble walking; dizziness; loss of balance or coordination Side effects that usually do not require medical attention (report to your doctor or health care professional if they continue or are  bothersome):  chills  cough  dizziness  fever  headaches  joint pain  muscle cramps  muscle pain  nausea, vomiting  pain, redness, or irritation at site where injected This list may not describe all possible side effects. Call your doctor for medical advice about side effects. You may report side effects to FDA at 1-800-FDA-1088. Where should I keep my medicine? Keep out of the reach of children. Store in a refrigerator between 2 and 8 degrees C (36 and 46 degrees F). Do not freeze or shake. Throw away any unused portion if using a single-dose vial. Multi-dose vials can be kept in the refrigerator for up to 21 days after the initial dose. Throw away unused medicine. NOTE: This sheet is a summary. It may not cover all possible information. If you have questions about this medicine, talk to your doctor, pharmacist, or health care provider.  2021 [employer]vier/Gold Standard (2017-05-31 08:35:19)  Cyanocobalamin, Vitamin B12 injection What is this medicine? CYANOCOBALAMIN (sye an oh koe BAL a min) is a man made form of vitamin B12. Vitamin B12 is used in the growth of healthy blood cells, nerve cells, and proteins in the body. It also helps with the metabolism of fats and carbohydrates. This medicine is used to treat people who can not absorb vitamin B12. This medicine may be used for other purposes; ask your health care provider or pharmacist if you have questions. COMMON BRAND NAME(S): B-12 Compliance Kit, B-12 Injection Kit, Cyomin, LA-12, Nutri-Twelve, Physicians EZ Use B-12, Primabalt What should I tell my health care provider before I take this medicine? They need to know if you have any of these conditions:  kidney disease  Leber's disease  megaloblastic anemia  an unusual or allergic reaction to cyanocobalamin, cobalt, other medicines, foods, dyes, or preservatives  pregnant or trying to get pregnant  breast-feeding How should I use this medicine? This medicine is  injected into a muscle or deeply under the skin. It is usually given by a health care professional in a clinic or doctor's office. However, your doctor may teach you how to inject yourself. Follow all instructions. Talk to your pediatrician regarding the use of this medicine in children. Special care may be needed. Overdosage: If you think you have taken too much of this medicine contact a poison control center or emergency room at once. NOTE: This medicine is only for you. Do not share this medicine with others. What if I miss a dose? If you are given your dose at a clinic or doctor's office, call to reschedule your appointment. If you give your own injections and you miss a dose, take it as soon as you can. If it is almost time for your next dose, take only that dose. Do not take double or extra doses. What may interact with this medicine?  colchicine  heavy alcohol intake This list may not describe all possible interactions. Give your health care provider a list of all the medicines, herbs, non-prescription drugs, or dietary supplements you use. Also tell them if you smoke, drink alcohol, or use illegal drugs. Some items may interact with   your medicine. What should I watch for while using this medicine? Visit your doctor or health care professional regularly. You may need blood work done while you are taking this medicine. You may need to follow a special diet. Talk to your doctor. Limit your alcohol intake and avoid smoking to get the best benefit. What side effects may I notice from receiving this medicine? Side effects that you should report to your doctor or health care professional as soon as possible:  allergic reactions like skin rash, itching or hives, swelling of the face, lips, or tongue  blue tint to skin  chest tightness, pain  difficulty breathing, wheezing  dizziness  red, swollen painful area on the leg Side effects that usually do not require medical attention (report  to your doctor or health care professional if they continue or are bothersome):  diarrhea  headache This list may not describe all possible side effects. Call your doctor for medical advice about side effects. You may report side effects to FDA at 1-800-FDA-1088. Where should I keep my medicine? Keep out of the reach of children. Store at room temperature between 15 and 30 degrees C (59 and 85 degrees F). Protect from light. Throw away any unused medicine after the expiration date. NOTE: This sheet is a summary. It may not cover all possible information. If you have questions about this medicine, talk to your doctor, pharmacist, or health care provider.  2021 Elsevier/Gold Standard (2008-02-02 22:10:20)   

## 2021-01-17 DIAGNOSIS — M2010 Hallux valgus (acquired), unspecified foot: Secondary | ICD-10-CM | POA: Diagnosis not present

## 2021-01-17 DIAGNOSIS — Z7902 Long term (current) use of antithrombotics/antiplatelets: Secondary | ICD-10-CM | POA: Diagnosis not present

## 2021-01-17 DIAGNOSIS — R001 Bradycardia, unspecified: Secondary | ICD-10-CM | POA: Diagnosis not present

## 2021-01-17 DIAGNOSIS — D631 Anemia in chronic kidney disease: Secondary | ICD-10-CM | POA: Diagnosis not present

## 2021-01-17 DIAGNOSIS — I2510 Atherosclerotic heart disease of native coronary artery without angina pectoris: Secondary | ICD-10-CM | POA: Diagnosis not present

## 2021-01-17 DIAGNOSIS — M66241 Spontaneous rupture of extensor tendons, right hand: Secondary | ICD-10-CM | POA: Diagnosis not present

## 2021-01-17 DIAGNOSIS — I351 Nonrheumatic aortic (valve) insufficiency: Secondary | ICD-10-CM | POA: Diagnosis not present

## 2021-01-17 DIAGNOSIS — E1122 Type 2 diabetes mellitus with diabetic chronic kidney disease: Secondary | ICD-10-CM | POA: Diagnosis not present

## 2021-01-17 DIAGNOSIS — M069 Rheumatoid arthritis, unspecified: Secondary | ICD-10-CM | POA: Diagnosis not present

## 2021-01-17 DIAGNOSIS — E875 Hyperkalemia: Secondary | ICD-10-CM | POA: Diagnosis not present

## 2021-01-17 DIAGNOSIS — J441 Chronic obstructive pulmonary disease with (acute) exacerbation: Secondary | ICD-10-CM | POA: Diagnosis not present

## 2021-01-17 DIAGNOSIS — E1142 Type 2 diabetes mellitus with diabetic polyneuropathy: Secondary | ICD-10-CM | POA: Diagnosis not present

## 2021-01-17 DIAGNOSIS — I5030 Unspecified diastolic (congestive) heart failure: Secondary | ICD-10-CM | POA: Diagnosis not present

## 2021-01-17 DIAGNOSIS — Z7901 Long term (current) use of anticoagulants: Secondary | ICD-10-CM | POA: Diagnosis not present

## 2021-01-17 DIAGNOSIS — I491 Atrial premature depolarization: Secondary | ICD-10-CM | POA: Diagnosis not present

## 2021-01-17 DIAGNOSIS — I130 Hypertensive heart and chronic kidney disease with heart failure and stage 1 through stage 4 chronic kidney disease, or unspecified chronic kidney disease: Secondary | ICD-10-CM | POA: Diagnosis not present

## 2021-01-17 DIAGNOSIS — I4891 Unspecified atrial fibrillation: Secondary | ICD-10-CM | POA: Diagnosis not present

## 2021-01-17 DIAGNOSIS — K5790 Diverticulosis of intestine, part unspecified, without perforation or abscess without bleeding: Secondary | ICD-10-CM | POA: Diagnosis not present

## 2021-01-17 DIAGNOSIS — J189 Pneumonia, unspecified organism: Secondary | ICD-10-CM | POA: Diagnosis not present

## 2021-01-17 DIAGNOSIS — Z7951 Long term (current) use of inhaled steroids: Secondary | ICD-10-CM | POA: Diagnosis not present

## 2021-01-17 DIAGNOSIS — Z7982 Long term (current) use of aspirin: Secondary | ICD-10-CM | POA: Diagnosis not present

## 2021-01-17 DIAGNOSIS — N184 Chronic kidney disease, stage 4 (severe): Secondary | ICD-10-CM | POA: Diagnosis not present

## 2021-01-17 DIAGNOSIS — Z7952 Long term (current) use of systemic steroids: Secondary | ICD-10-CM | POA: Diagnosis not present

## 2021-01-17 DIAGNOSIS — K219 Gastro-esophageal reflux disease without esophagitis: Secondary | ICD-10-CM | POA: Diagnosis not present

## 2021-01-17 DIAGNOSIS — R9431 Abnormal electrocardiogram [ECG] [EKG]: Secondary | ICD-10-CM | POA: Diagnosis not present

## 2021-01-19 ENCOUNTER — Other Ambulatory Visit: Payer: Self-pay | Admitting: Pharmacist

## 2021-01-19 DIAGNOSIS — M069 Rheumatoid arthritis, unspecified: Secondary | ICD-10-CM | POA: Diagnosis not present

## 2021-01-19 DIAGNOSIS — D631 Anemia in chronic kidney disease: Secondary | ICD-10-CM

## 2021-01-19 DIAGNOSIS — E1122 Type 2 diabetes mellitus with diabetic chronic kidney disease: Secondary | ICD-10-CM | POA: Diagnosis not present

## 2021-01-19 DIAGNOSIS — N184 Chronic kidney disease, stage 4 (severe): Secondary | ICD-10-CM

## 2021-01-19 DIAGNOSIS — I130 Hypertensive heart and chronic kidney disease with heart failure and stage 1 through stage 4 chronic kidney disease, or unspecified chronic kidney disease: Secondary | ICD-10-CM | POA: Diagnosis not present

## 2021-01-19 DIAGNOSIS — I4891 Unspecified atrial fibrillation: Secondary | ICD-10-CM | POA: Diagnosis not present

## 2021-01-19 DIAGNOSIS — E1142 Type 2 diabetes mellitus with diabetic polyneuropathy: Secondary | ICD-10-CM | POA: Diagnosis not present

## 2021-01-19 DIAGNOSIS — R001 Bradycardia, unspecified: Secondary | ICD-10-CM | POA: Diagnosis not present

## 2021-01-24 ENCOUNTER — Other Ambulatory Visit: Payer: Self-pay | Admitting: Hematology and Oncology

## 2021-01-24 ENCOUNTER — Inpatient Hospital Stay: Payer: Medicare Other

## 2021-01-24 ENCOUNTER — Other Ambulatory Visit: Payer: Self-pay

## 2021-01-24 DIAGNOSIS — I480 Paroxysmal atrial fibrillation: Secondary | ICD-10-CM | POA: Diagnosis not present

## 2021-01-24 DIAGNOSIS — I130 Hypertensive heart and chronic kidney disease with heart failure and stage 1 through stage 4 chronic kidney disease, or unspecified chronic kidney disease: Secondary | ICD-10-CM | POA: Diagnosis not present

## 2021-01-24 DIAGNOSIS — E1122 Type 2 diabetes mellitus with diabetic chronic kidney disease: Secondary | ICD-10-CM | POA: Diagnosis not present

## 2021-01-24 DIAGNOSIS — I4891 Unspecified atrial fibrillation: Secondary | ICD-10-CM | POA: Diagnosis not present

## 2021-01-24 DIAGNOSIS — D649 Anemia, unspecified: Secondary | ICD-10-CM | POA: Diagnosis not present

## 2021-01-24 DIAGNOSIS — N189 Chronic kidney disease, unspecified: Secondary | ICD-10-CM | POA: Diagnosis not present

## 2021-01-24 DIAGNOSIS — E1142 Type 2 diabetes mellitus with diabetic polyneuropathy: Secondary | ICD-10-CM | POA: Diagnosis not present

## 2021-01-24 DIAGNOSIS — R001 Bradycardia, unspecified: Secondary | ICD-10-CM | POA: Diagnosis not present

## 2021-01-24 DIAGNOSIS — D500 Iron deficiency anemia secondary to blood loss (chronic): Secondary | ICD-10-CM

## 2021-01-24 DIAGNOSIS — M069 Rheumatoid arthritis, unspecified: Secondary | ICD-10-CM | POA: Diagnosis not present

## 2021-01-24 DIAGNOSIS — D631 Anemia in chronic kidney disease: Secondary | ICD-10-CM | POA: Diagnosis not present

## 2021-01-24 LAB — CBC AND DIFFERENTIAL
HCT: 32 — AB (ref 36–46)
Hemoglobin: 10.2 — AB (ref 12.0–16.0)
Neutrophils Absolute: 7.01
Platelets: 291 (ref 150–399)
WBC: 9.1

## 2021-01-24 LAB — CBC: RBC: 3.6 — AB (ref 3.87–5.11)

## 2021-01-25 ENCOUNTER — Inpatient Hospital Stay: Payer: Medicare Other

## 2021-01-25 DIAGNOSIS — M069 Rheumatoid arthritis, unspecified: Secondary | ICD-10-CM | POA: Diagnosis not present

## 2021-01-25 DIAGNOSIS — I130 Hypertensive heart and chronic kidney disease with heart failure and stage 1 through stage 4 chronic kidney disease, or unspecified chronic kidney disease: Secondary | ICD-10-CM | POA: Diagnosis not present

## 2021-01-25 DIAGNOSIS — I4891 Unspecified atrial fibrillation: Secondary | ICD-10-CM | POA: Diagnosis not present

## 2021-01-25 DIAGNOSIS — R001 Bradycardia, unspecified: Secondary | ICD-10-CM | POA: Diagnosis not present

## 2021-01-25 DIAGNOSIS — E1122 Type 2 diabetes mellitus with diabetic chronic kidney disease: Secondary | ICD-10-CM | POA: Diagnosis not present

## 2021-01-25 DIAGNOSIS — E1142 Type 2 diabetes mellitus with diabetic polyneuropathy: Secondary | ICD-10-CM | POA: Diagnosis not present

## 2021-01-27 ENCOUNTER — Inpatient Hospital Stay: Payer: Medicare Other

## 2021-01-31 NOTE — Addendum Note (Signed)
Addended by: Juanetta Beets on: 01/31/2021 12:26 PM   Modules accepted: Orders

## 2021-02-01 DIAGNOSIS — M069 Rheumatoid arthritis, unspecified: Secondary | ICD-10-CM | POA: Diagnosis not present

## 2021-02-01 DIAGNOSIS — E1122 Type 2 diabetes mellitus with diabetic chronic kidney disease: Secondary | ICD-10-CM | POA: Diagnosis not present

## 2021-02-01 DIAGNOSIS — E1142 Type 2 diabetes mellitus with diabetic polyneuropathy: Secondary | ICD-10-CM | POA: Diagnosis not present

## 2021-02-01 DIAGNOSIS — I130 Hypertensive heart and chronic kidney disease with heart failure and stage 1 through stage 4 chronic kidney disease, or unspecified chronic kidney disease: Secondary | ICD-10-CM | POA: Diagnosis not present

## 2021-02-01 DIAGNOSIS — R001 Bradycardia, unspecified: Secondary | ICD-10-CM | POA: Diagnosis not present

## 2021-02-01 DIAGNOSIS — I4891 Unspecified atrial fibrillation: Secondary | ICD-10-CM | POA: Diagnosis not present

## 2021-02-02 DIAGNOSIS — J449 Chronic obstructive pulmonary disease, unspecified: Secondary | ICD-10-CM | POA: Diagnosis not present

## 2021-02-02 DIAGNOSIS — I130 Hypertensive heart and chronic kidney disease with heart failure and stage 1 through stage 4 chronic kidney disease, or unspecified chronic kidney disease: Secondary | ICD-10-CM | POA: Diagnosis not present

## 2021-02-02 DIAGNOSIS — R0602 Shortness of breath: Secondary | ICD-10-CM | POA: Diagnosis not present

## 2021-02-02 DIAGNOSIS — J439 Emphysema, unspecified: Secondary | ICD-10-CM | POA: Diagnosis not present

## 2021-02-02 DIAGNOSIS — E1142 Type 2 diabetes mellitus with diabetic polyneuropathy: Secondary | ICD-10-CM | POA: Diagnosis not present

## 2021-02-02 DIAGNOSIS — M069 Rheumatoid arthritis, unspecified: Secondary | ICD-10-CM | POA: Diagnosis not present

## 2021-02-02 DIAGNOSIS — I5032 Chronic diastolic (congestive) heart failure: Secondary | ICD-10-CM | POA: Diagnosis not present

## 2021-02-02 DIAGNOSIS — I517 Cardiomegaly: Secondary | ICD-10-CM | POA: Diagnosis not present

## 2021-02-02 DIAGNOSIS — I509 Heart failure, unspecified: Secondary | ICD-10-CM | POA: Diagnosis not present

## 2021-02-02 DIAGNOSIS — E1122 Type 2 diabetes mellitus with diabetic chronic kidney disease: Secondary | ICD-10-CM | POA: Diagnosis not present

## 2021-02-02 DIAGNOSIS — J189 Pneumonia, unspecified organism: Secondary | ICD-10-CM | POA: Diagnosis not present

## 2021-02-02 DIAGNOSIS — I4891 Unspecified atrial fibrillation: Secondary | ICD-10-CM | POA: Diagnosis not present

## 2021-02-02 DIAGNOSIS — R001 Bradycardia, unspecified: Secondary | ICD-10-CM | POA: Diagnosis not present

## 2021-02-03 DIAGNOSIS — I10 Essential (primary) hypertension: Secondary | ICD-10-CM | POA: Diagnosis not present

## 2021-02-03 DIAGNOSIS — E1169 Type 2 diabetes mellitus with other specified complication: Secondary | ICD-10-CM | POA: Diagnosis not present

## 2021-02-03 DIAGNOSIS — M810 Age-related osteoporosis without current pathological fracture: Secondary | ICD-10-CM | POA: Diagnosis not present

## 2021-02-03 DIAGNOSIS — I5032 Chronic diastolic (congestive) heart failure: Secondary | ICD-10-CM | POA: Diagnosis not present

## 2021-02-06 DIAGNOSIS — N184 Chronic kidney disease, stage 4 (severe): Secondary | ICD-10-CM | POA: Diagnosis not present

## 2021-02-06 DIAGNOSIS — I5032 Chronic diastolic (congestive) heart failure: Secondary | ICD-10-CM | POA: Diagnosis not present

## 2021-02-06 DIAGNOSIS — N2581 Secondary hyperparathyroidism of renal origin: Secondary | ICD-10-CM | POA: Diagnosis not present

## 2021-02-06 DIAGNOSIS — D509 Iron deficiency anemia, unspecified: Secondary | ICD-10-CM | POA: Diagnosis not present

## 2021-02-06 DIAGNOSIS — J441 Chronic obstructive pulmonary disease with (acute) exacerbation: Secondary | ICD-10-CM | POA: Diagnosis not present

## 2021-02-07 DIAGNOSIS — E1122 Type 2 diabetes mellitus with diabetic chronic kidney disease: Secondary | ICD-10-CM | POA: Diagnosis not present

## 2021-02-07 DIAGNOSIS — E1142 Type 2 diabetes mellitus with diabetic polyneuropathy: Secondary | ICD-10-CM | POA: Diagnosis not present

## 2021-02-07 DIAGNOSIS — I4891 Unspecified atrial fibrillation: Secondary | ICD-10-CM | POA: Diagnosis not present

## 2021-02-07 DIAGNOSIS — R001 Bradycardia, unspecified: Secondary | ICD-10-CM | POA: Diagnosis not present

## 2021-02-07 DIAGNOSIS — M069 Rheumatoid arthritis, unspecified: Secondary | ICD-10-CM | POA: Diagnosis not present

## 2021-02-07 DIAGNOSIS — I130 Hypertensive heart and chronic kidney disease with heart failure and stage 1 through stage 4 chronic kidney disease, or unspecified chronic kidney disease: Secondary | ICD-10-CM | POA: Diagnosis not present

## 2021-02-08 ENCOUNTER — Inpatient Hospital Stay: Payer: Medicare Other

## 2021-02-08 NOTE — Telephone Encounter (Signed)
No Action

## 2021-02-09 ENCOUNTER — Other Ambulatory Visit: Payer: Self-pay | Admitting: Hematology and Oncology

## 2021-02-09 ENCOUNTER — Inpatient Hospital Stay: Payer: Medicare Other

## 2021-02-09 ENCOUNTER — Inpatient Hospital Stay: Payer: Medicare Other | Attending: Oncology

## 2021-02-09 ENCOUNTER — Other Ambulatory Visit: Payer: Self-pay

## 2021-02-09 VITALS — BP 102/64 | HR 72 | Temp 98.4°F | Resp 24 | Ht 59.0 in | Wt 164.0 lb

## 2021-02-09 DIAGNOSIS — D631 Anemia in chronic kidney disease: Secondary | ICD-10-CM

## 2021-02-09 DIAGNOSIS — N184 Chronic kidney disease, stage 4 (severe): Secondary | ICD-10-CM

## 2021-02-09 DIAGNOSIS — I4891 Unspecified atrial fibrillation: Secondary | ICD-10-CM | POA: Diagnosis not present

## 2021-02-09 DIAGNOSIS — E538 Deficiency of other specified B group vitamins: Secondary | ICD-10-CM | POA: Insufficient documentation

## 2021-02-09 DIAGNOSIS — S7010XA Contusion of unspecified thigh, initial encounter: Secondary | ICD-10-CM | POA: Diagnosis not present

## 2021-02-09 DIAGNOSIS — Z6825 Body mass index (BMI) 25.0-25.9, adult: Secondary | ICD-10-CM | POA: Diagnosis not present

## 2021-02-09 DIAGNOSIS — D509 Iron deficiency anemia, unspecified: Secondary | ICD-10-CM | POA: Diagnosis not present

## 2021-02-09 DIAGNOSIS — I130 Hypertensive heart and chronic kidney disease with heart failure and stage 1 through stage 4 chronic kidney disease, or unspecified chronic kidney disease: Secondary | ICD-10-CM | POA: Diagnosis not present

## 2021-02-09 DIAGNOSIS — E1142 Type 2 diabetes mellitus with diabetic polyneuropathy: Secondary | ICD-10-CM | POA: Diagnosis not present

## 2021-02-09 DIAGNOSIS — D649 Anemia, unspecified: Secondary | ICD-10-CM | POA: Diagnosis not present

## 2021-02-09 DIAGNOSIS — M069 Rheumatoid arthritis, unspecified: Secondary | ICD-10-CM | POA: Diagnosis not present

## 2021-02-09 DIAGNOSIS — G2581 Restless legs syndrome: Secondary | ICD-10-CM | POA: Diagnosis not present

## 2021-02-09 DIAGNOSIS — R001 Bradycardia, unspecified: Secondary | ICD-10-CM | POA: Diagnosis not present

## 2021-02-09 DIAGNOSIS — E1122 Type 2 diabetes mellitus with diabetic chronic kidney disease: Secondary | ICD-10-CM | POA: Diagnosis not present

## 2021-02-09 LAB — CBC AND DIFFERENTIAL
HCT: 29 — AB (ref 36–46)
Hemoglobin: 9.1 — AB (ref 12.0–16.0)
Neutrophils Absolute: 8.96
Platelets: 170 (ref 150–399)
WBC: 11.2

## 2021-02-09 LAB — CBC: RBC: 3.31 — AB (ref 3.87–5.11)

## 2021-02-09 MED ORDER — EPOETIN ALFA-EPBX 10000 UNIT/ML IJ SOLN
INTRAMUSCULAR | Status: AC
  Filled 2021-02-09: qty 2

## 2021-02-09 MED ORDER — EPOETIN ALFA-EPBX 10000 UNIT/ML IJ SOLN
20000.0000 [IU] | Freq: Once | INTRAMUSCULAR | Status: AC
  Administered 2021-02-09: 20000 [IU] via SUBCUTANEOUS

## 2021-02-09 NOTE — Patient Instructions (Signed)

## 2021-02-09 NOTE — Addendum Note (Signed)
Addended by: Juanetta Beets on: 02/09/2021 12:21 PM   Modules accepted: Orders

## 2021-02-13 DIAGNOSIS — I4891 Unspecified atrial fibrillation: Secondary | ICD-10-CM | POA: Diagnosis not present

## 2021-02-13 DIAGNOSIS — E1122 Type 2 diabetes mellitus with diabetic chronic kidney disease: Secondary | ICD-10-CM | POA: Diagnosis not present

## 2021-02-13 DIAGNOSIS — E1142 Type 2 diabetes mellitus with diabetic polyneuropathy: Secondary | ICD-10-CM | POA: Diagnosis not present

## 2021-02-13 DIAGNOSIS — M069 Rheumatoid arthritis, unspecified: Secondary | ICD-10-CM | POA: Diagnosis not present

## 2021-02-13 DIAGNOSIS — I130 Hypertensive heart and chronic kidney disease with heart failure and stage 1 through stage 4 chronic kidney disease, or unspecified chronic kidney disease: Secondary | ICD-10-CM | POA: Diagnosis not present

## 2021-02-13 DIAGNOSIS — R001 Bradycardia, unspecified: Secondary | ICD-10-CM | POA: Diagnosis not present

## 2021-02-14 DIAGNOSIS — I472 Ventricular tachycardia: Secondary | ICD-10-CM | POA: Diagnosis not present

## 2021-02-14 DIAGNOSIS — I4891 Unspecified atrial fibrillation: Secondary | ICD-10-CM | POA: Diagnosis not present

## 2021-02-15 DIAGNOSIS — I130 Hypertensive heart and chronic kidney disease with heart failure and stage 1 through stage 4 chronic kidney disease, or unspecified chronic kidney disease: Secondary | ICD-10-CM | POA: Diagnosis not present

## 2021-02-15 DIAGNOSIS — M069 Rheumatoid arthritis, unspecified: Secondary | ICD-10-CM | POA: Diagnosis not present

## 2021-02-15 DIAGNOSIS — R001 Bradycardia, unspecified: Secondary | ICD-10-CM | POA: Diagnosis not present

## 2021-02-15 DIAGNOSIS — I4891 Unspecified atrial fibrillation: Secondary | ICD-10-CM | POA: Diagnosis not present

## 2021-02-15 DIAGNOSIS — E1142 Type 2 diabetes mellitus with diabetic polyneuropathy: Secondary | ICD-10-CM | POA: Diagnosis not present

## 2021-02-15 DIAGNOSIS — E1122 Type 2 diabetes mellitus with diabetic chronic kidney disease: Secondary | ICD-10-CM | POA: Diagnosis not present

## 2021-02-23 DIAGNOSIS — E1169 Type 2 diabetes mellitus with other specified complication: Secondary | ICD-10-CM | POA: Diagnosis not present

## 2021-02-23 DIAGNOSIS — I5032 Chronic diastolic (congestive) heart failure: Secondary | ICD-10-CM | POA: Diagnosis not present

## 2021-02-23 DIAGNOSIS — J9610 Chronic respiratory failure, unspecified whether with hypoxia or hypercapnia: Secondary | ICD-10-CM | POA: Diagnosis not present

## 2021-02-23 DIAGNOSIS — E782 Mixed hyperlipidemia: Secondary | ICD-10-CM | POA: Diagnosis not present

## 2021-02-24 DIAGNOSIS — M069 Rheumatoid arthritis, unspecified: Secondary | ICD-10-CM | POA: Diagnosis not present

## 2021-02-24 DIAGNOSIS — Z96641 Presence of right artificial hip joint: Secondary | ICD-10-CM | POA: Diagnosis not present

## 2021-02-24 DIAGNOSIS — E43 Unspecified severe protein-calorie malnutrition: Secondary | ICD-10-CM | POA: Diagnosis not present

## 2021-02-24 DIAGNOSIS — Z8701 Personal history of pneumonia (recurrent): Secondary | ICD-10-CM | POA: Diagnosis not present

## 2021-02-24 DIAGNOSIS — E1140 Type 2 diabetes mellitus with diabetic neuropathy, unspecified: Secondary | ICD-10-CM | POA: Diagnosis not present

## 2021-02-24 DIAGNOSIS — Z8719 Personal history of other diseases of the digestive system: Secondary | ICD-10-CM | POA: Diagnosis not present

## 2021-02-24 DIAGNOSIS — Z9981 Dependence on supplemental oxygen: Secondary | ICD-10-CM | POA: Diagnosis not present

## 2021-02-24 DIAGNOSIS — E1122 Type 2 diabetes mellitus with diabetic chronic kidney disease: Secondary | ICD-10-CM | POA: Diagnosis not present

## 2021-02-24 DIAGNOSIS — F32A Depression, unspecified: Secondary | ICD-10-CM | POA: Diagnosis not present

## 2021-02-24 DIAGNOSIS — K2270 Barrett's esophagus without dysplasia: Secondary | ICD-10-CM | POA: Diagnosis not present

## 2021-02-24 DIAGNOSIS — K219 Gastro-esophageal reflux disease without esophagitis: Secondary | ICD-10-CM | POA: Diagnosis not present

## 2021-02-24 DIAGNOSIS — Z87891 Personal history of nicotine dependence: Secondary | ICD-10-CM | POA: Diagnosis not present

## 2021-02-24 DIAGNOSIS — I509 Heart failure, unspecified: Secondary | ICD-10-CM | POA: Diagnosis not present

## 2021-02-24 DIAGNOSIS — I130 Hypertensive heart and chronic kidney disease with heart failure and stage 1 through stage 4 chronic kidney disease, or unspecified chronic kidney disease: Secondary | ICD-10-CM | POA: Diagnosis not present

## 2021-02-24 DIAGNOSIS — M1990 Unspecified osteoarthritis, unspecified site: Secondary | ICD-10-CM | POA: Diagnosis not present

## 2021-02-24 DIAGNOSIS — J449 Chronic obstructive pulmonary disease, unspecified: Secondary | ICD-10-CM | POA: Diagnosis not present

## 2021-02-24 DIAGNOSIS — Z87440 Personal history of urinary (tract) infections: Secondary | ICD-10-CM | POA: Diagnosis not present

## 2021-02-24 DIAGNOSIS — N184 Chronic kidney disease, stage 4 (severe): Secondary | ICD-10-CM | POA: Diagnosis not present

## 2021-02-24 DIAGNOSIS — I4819 Other persistent atrial fibrillation: Secondary | ICD-10-CM | POA: Diagnosis not present

## 2021-02-24 DIAGNOSIS — F419 Anxiety disorder, unspecified: Secondary | ICD-10-CM | POA: Diagnosis not present

## 2021-02-25 DIAGNOSIS — N184 Chronic kidney disease, stage 4 (severe): Secondary | ICD-10-CM | POA: Diagnosis not present

## 2021-02-25 DIAGNOSIS — E1122 Type 2 diabetes mellitus with diabetic chronic kidney disease: Secondary | ICD-10-CM | POA: Diagnosis not present

## 2021-02-25 DIAGNOSIS — I509 Heart failure, unspecified: Secondary | ICD-10-CM | POA: Diagnosis not present

## 2021-02-25 DIAGNOSIS — I4819 Other persistent atrial fibrillation: Secondary | ICD-10-CM | POA: Diagnosis not present

## 2021-02-25 DIAGNOSIS — I130 Hypertensive heart and chronic kidney disease with heart failure and stage 1 through stage 4 chronic kidney disease, or unspecified chronic kidney disease: Secondary | ICD-10-CM | POA: Diagnosis not present

## 2021-02-25 DIAGNOSIS — F32A Depression, unspecified: Secondary | ICD-10-CM | POA: Diagnosis not present

## 2021-03-05 DEATH — deceased

## 2021-03-21 ENCOUNTER — Other Ambulatory Visit: Payer: Medicare Other

## 2021-03-21 ENCOUNTER — Ambulatory Visit: Payer: Medicare Other | Admitting: Oncology
